# Patient Record
Sex: Male | Born: 1935
Health system: Southern US, Community
[De-identification: ages and names within clinical notes are randomized; demographics above are authoritative.]

## PROBLEM LIST (undated history)

## (undated) DIAGNOSIS — I10 Essential (primary) hypertension: Secondary | ICD-10-CM

## (undated) DIAGNOSIS — D126 Benign neoplasm of colon, unspecified: Secondary | ICD-10-CM

## (undated) DIAGNOSIS — C61 Malignant neoplasm of prostate: Secondary | ICD-10-CM

## (undated) DIAGNOSIS — E785 Hyperlipidemia, unspecified: Secondary | ICD-10-CM

## (undated) DIAGNOSIS — T4145XA Adverse effect of unspecified anesthetic, initial encounter: Secondary | ICD-10-CM

## (undated) DIAGNOSIS — I80209 Phlebitis and thrombophlebitis of unspecified deep vessels of unspecified lower extremity: Secondary | ICD-10-CM

## (undated) DIAGNOSIS — L723 Sebaceous cyst: Secondary | ICD-10-CM

## (undated) DIAGNOSIS — F419 Anxiety disorder, unspecified: Secondary | ICD-10-CM

## (undated) DIAGNOSIS — R7989 Other specified abnormal findings of blood chemistry: Secondary | ICD-10-CM

## (undated) DIAGNOSIS — I480 Paroxysmal atrial fibrillation: Secondary | ICD-10-CM

## (undated) DIAGNOSIS — T8859XA Other complications of anesthesia, initial encounter: Secondary | ICD-10-CM

## (undated) DIAGNOSIS — I6529 Occlusion and stenosis of unspecified carotid artery: Secondary | ICD-10-CM

## (undated) DIAGNOSIS — M1990 Unspecified osteoarthritis, unspecified site: Secondary | ICD-10-CM

## (undated) DIAGNOSIS — K449 Diaphragmatic hernia without obstruction or gangrene: Secondary | ICD-10-CM

## (undated) DIAGNOSIS — I4891 Unspecified atrial fibrillation: Secondary | ICD-10-CM

## (undated) DIAGNOSIS — K219 Gastro-esophageal reflux disease without esophagitis: Secondary | ICD-10-CM

## (undated) DIAGNOSIS — I2699 Other pulmonary embolism without acute cor pulmonale: Secondary | ICD-10-CM

## (undated) DIAGNOSIS — I2510 Atherosclerotic heart disease of native coronary artery without angina pectoris: Secondary | ICD-10-CM

## (undated) HISTORY — DX: Benign neoplasm of colon, unspecified: D12.6

## (undated) HISTORY — DX: Unspecified osteoarthritis, unspecified site: M19.90

## (undated) HISTORY — DX: Atherosclerotic heart disease of native coronary artery without angina pectoris: I25.10

## (undated) HISTORY — DX: Diaphragmatic hernia without obstruction or gangrene: K44.9

## (undated) HISTORY — DX: Essential (primary) hypertension: I10

## (undated) HISTORY — PX: TANDEM AND OVOID INSERTION: SHX2480

## (undated) HISTORY — DX: Unspecified atrial fibrillation: I48.91

## (undated) HISTORY — DX: Malignant neoplasm of prostate: C61

## (undated) HISTORY — DX: Gastro-esophageal reflux disease without esophagitis: K21.9

## (undated) HISTORY — PX: MITRAL VALVE REPAIR: SHX2039

## (undated) HISTORY — DX: Paroxysmal atrial fibrillation: I48.0

## (undated) HISTORY — DX: Sebaceous cyst: L72.3

## (undated) HISTORY — DX: Anxiety disorder, unspecified: F41.9

## (undated) HISTORY — DX: Other specified abnormal findings of blood chemistry: R79.89

## (undated) HISTORY — PX: OTHER SURGICAL HISTORY: SHX169

## (undated) HISTORY — PX: CORONARY ARTERY BYPASS GRAFT: SHX141

## (undated) HISTORY — DX: Other pulmonary embolism without acute cor pulmonale: I26.99

## (undated) HISTORY — DX: Hyperlipidemia, unspecified: E78.5

## (undated) HISTORY — DX: Phlebitis and thrombophlebitis of unspecified deep vessels of unspecified lower extremity: I80.209

## (undated) HISTORY — PX: VASECTOMY: SHX75

## (undated) HISTORY — DX: Occlusion and stenosis of unspecified carotid artery: I65.29

---

## 1996-11-03 HISTORY — PX: OTHER SURGICAL HISTORY: SHX169

## 1998-01-02 ENCOUNTER — Ambulatory Visit (HOSPITAL_COMMUNITY): Admission: RE | Admit: 1998-01-02 | Discharge: 1998-01-02 | Payer: Self-pay | Admitting: Urology

## 2004-05-08 ENCOUNTER — Ambulatory Visit: Payer: Self-pay

## 2004-05-22 ENCOUNTER — Ambulatory Visit: Payer: Self-pay | Admitting: Internal Medicine

## 2004-07-24 ENCOUNTER — Ambulatory Visit: Payer: Self-pay | Admitting: Cardiology

## 2004-08-07 ENCOUNTER — Ambulatory Visit: Payer: Self-pay | Admitting: Pulmonary Disease

## 2004-08-14 ENCOUNTER — Ambulatory Visit: Payer: Self-pay | Admitting: *Deleted

## 2004-08-21 ENCOUNTER — Ambulatory Visit: Payer: Self-pay | Admitting: *Deleted

## 2004-09-18 ENCOUNTER — Ambulatory Visit: Payer: Self-pay | Admitting: Cardiology

## 2004-10-23 ENCOUNTER — Ambulatory Visit: Payer: Self-pay | Admitting: Cardiology

## 2004-11-20 ENCOUNTER — Ambulatory Visit: Payer: Self-pay | Admitting: Cardiology

## 2004-12-04 ENCOUNTER — Ambulatory Visit: Payer: Self-pay | Admitting: Internal Medicine

## 2005-01-07 ENCOUNTER — Ambulatory Visit: Payer: Self-pay | Admitting: Internal Medicine

## 2005-01-26 ENCOUNTER — Ambulatory Visit: Payer: Self-pay | Admitting: Cardiology

## 2005-02-02 ENCOUNTER — Ambulatory Visit: Payer: Self-pay | Admitting: Cardiology

## 2005-02-18 ENCOUNTER — Ambulatory Visit: Payer: Self-pay | Admitting: Cardiology

## 2005-03-18 ENCOUNTER — Ambulatory Visit: Payer: Self-pay | Admitting: Cardiology

## 2005-04-01 ENCOUNTER — Ambulatory Visit: Payer: Self-pay | Admitting: Cardiology

## 2005-04-24 ENCOUNTER — Ambulatory Visit: Payer: Self-pay | Admitting: Cardiology

## 2005-05-22 ENCOUNTER — Ambulatory Visit: Payer: Self-pay | Admitting: Cardiology

## 2005-05-22 ENCOUNTER — Ambulatory Visit: Payer: Self-pay

## 2005-06-05 ENCOUNTER — Ambulatory Visit: Payer: Self-pay | Admitting: Internal Medicine

## 2005-07-01 ENCOUNTER — Ambulatory Visit: Payer: Self-pay | Admitting: Pulmonary Disease

## 2005-07-03 ENCOUNTER — Ambulatory Visit: Payer: Self-pay | Admitting: Cardiology

## 2005-07-10 ENCOUNTER — Ambulatory Visit: Payer: Self-pay | Admitting: Cardiology

## 2005-07-15 ENCOUNTER — Ambulatory Visit: Payer: Self-pay | Admitting: Pulmonary Disease

## 2005-07-27 ENCOUNTER — Ambulatory Visit: Payer: Self-pay | Admitting: Internal Medicine

## 2005-07-27 ENCOUNTER — Ambulatory Visit: Payer: Self-pay | Admitting: Cardiology

## 2005-08-06 ENCOUNTER — Encounter: Payer: Self-pay | Admitting: Cardiology

## 2005-08-06 ENCOUNTER — Ambulatory Visit: Payer: Self-pay

## 2005-08-06 ENCOUNTER — Ambulatory Visit: Payer: Self-pay | Admitting: Cardiology

## 2005-08-14 ENCOUNTER — Ambulatory Visit: Payer: Self-pay | Admitting: Cardiovascular Disease

## 2005-08-24 ENCOUNTER — Ambulatory Visit: Payer: Self-pay | Admitting: Cardiology

## 2005-09-11 ENCOUNTER — Ambulatory Visit: Payer: Self-pay | Admitting: Cardiology

## 2005-10-02 ENCOUNTER — Ambulatory Visit: Payer: Self-pay | Admitting: Cardiology

## 2005-10-23 ENCOUNTER — Ambulatory Visit: Payer: Self-pay | Admitting: Internal Medicine

## 2005-11-19 ENCOUNTER — Ambulatory Visit: Payer: Self-pay | Admitting: Cardiology

## 2005-12-17 ENCOUNTER — Ambulatory Visit: Payer: Self-pay | Admitting: Cardiology

## 2006-01-07 ENCOUNTER — Ambulatory Visit: Payer: Self-pay | Admitting: Internal Medicine

## 2006-01-11 ENCOUNTER — Ambulatory Visit: Payer: Self-pay | Admitting: Pulmonary Disease

## 2006-02-11 ENCOUNTER — Ambulatory Visit: Payer: Self-pay | Admitting: Cardiology

## 2006-03-09 ENCOUNTER — Ambulatory Visit: Payer: Self-pay | Admitting: Cardiology

## 2006-03-11 ENCOUNTER — Ambulatory Visit: Payer: Self-pay | Admitting: Cardiology

## 2006-03-31 ENCOUNTER — Ambulatory Visit: Payer: Self-pay | Admitting: Cardiology

## 2006-04-07 ENCOUNTER — Ambulatory Visit: Payer: Self-pay | Admitting: Cardiology

## 2006-05-05 ENCOUNTER — Ambulatory Visit: Payer: Self-pay | Admitting: Cardiovascular Disease

## 2006-06-02 ENCOUNTER — Ambulatory Visit: Payer: Self-pay | Admitting: Cardiovascular Disease

## 2006-06-30 ENCOUNTER — Ambulatory Visit: Payer: Self-pay | Admitting: Cardiology

## 2006-07-28 ENCOUNTER — Ambulatory Visit: Payer: Self-pay | Admitting: Cardiovascular Disease

## 2006-08-25 ENCOUNTER — Ambulatory Visit: Payer: Self-pay | Admitting: Cardiology

## 2006-09-08 ENCOUNTER — Ambulatory Visit: Payer: Self-pay | Admitting: Cardiology

## 2006-09-17 ENCOUNTER — Ambulatory Visit: Payer: Self-pay | Admitting: Cardiovascular Disease

## 2006-10-13 ENCOUNTER — Ambulatory Visit: Payer: Self-pay | Admitting: Cardiology

## 2006-11-10 ENCOUNTER — Ambulatory Visit: Payer: Self-pay | Admitting: Cardiology

## 2006-12-08 ENCOUNTER — Ambulatory Visit: Payer: Self-pay | Admitting: Cardiology

## 2006-12-30 ENCOUNTER — Ambulatory Visit: Payer: Self-pay | Admitting: Internal Medicine

## 2007-01-19 ENCOUNTER — Ambulatory Visit: Payer: Self-pay | Admitting: Cardiovascular Disease

## 2007-02-16 ENCOUNTER — Ambulatory Visit: Payer: Self-pay | Admitting: Cardiology

## 2007-03-09 ENCOUNTER — Ambulatory Visit: Payer: Self-pay | Admitting: Cardiovascular Disease

## 2007-03-16 ENCOUNTER — Ambulatory Visit: Payer: Self-pay | Admitting: Cardiology

## 2007-04-06 ENCOUNTER — Ambulatory Visit: Payer: Self-pay | Admitting: Cardiology

## 2007-04-06 ENCOUNTER — Ambulatory Visit: Payer: Self-pay

## 2007-04-06 ENCOUNTER — Encounter: Payer: Self-pay | Admitting: Cardiology

## 2007-04-06 LAB — CONVERTED CEMR LAB
ALT: 27 units/L (ref 0–53)
AST: 29 units/L (ref 0–37)
Albumin: 4 g/dL (ref 3.5–5.2)
Alkaline Phosphatase: 93 units/L (ref 39–117)
Basophils Absolute: 0 10*3/uL (ref 0.0–0.1)
Calcium: 8.9 mg/dL (ref 8.4–10.5)
Chloride: 109 meq/L (ref 96–112)
Creatinine, Ser: 1 mg/dL (ref 0.4–1.5)
Eosinophils Absolute: 0.1 10*3/uL (ref 0.0–0.6)
GFR calc non Af Amer: 79 mL/min
HCT: 40.7 % (ref 39.0–52.0)
HDL: 55.2 mg/dL (ref >39.0)
MCHC: 34.4 g/dL (ref 30.0–36.0)
Neutrophils Relative %: 53.3 % (ref 43.0–77.0)
PSA: 0.04 ng/mL — ABNORMAL LOW (ref 0.10–4.00)
Platelets: 171 10*3/uL (ref 150–400)
RBC: 4.74 M/uL (ref 4.22–5.81)
RDW: 13.2 % (ref 11.5–14.6)
Sodium: 143 meq/L (ref 135–145)
Total Bilirubin: 1.1 mg/dL (ref 0.3–1.2)
Total CHOL/HDL Ratio: 4.1
Triglycerides: 124 mg/dL (ref 0–149)
WBC: 5 10*3/uL (ref 4.5–10.5)

## 2007-05-04 ENCOUNTER — Ambulatory Visit: Payer: Self-pay | Admitting: Cardiology

## 2007-05-10 ENCOUNTER — Ambulatory Visit: Payer: Self-pay | Admitting: Cardiology

## 2007-05-31 ENCOUNTER — Ambulatory Visit: Payer: Self-pay | Admitting: Cardiovascular Disease

## 2007-06-15 ENCOUNTER — Ambulatory Visit: Payer: Self-pay | Admitting: Cardiology

## 2007-06-15 ENCOUNTER — Encounter: Payer: Self-pay | Admitting: Cardiology

## 2007-06-15 ENCOUNTER — Ambulatory Visit (HOSPITAL_COMMUNITY): Admission: RE | Admit: 2007-06-15 | Discharge: 2007-06-15 | Payer: Self-pay | Admitting: Cardiology

## 2007-06-24 ENCOUNTER — Ambulatory Visit: Payer: Self-pay | Admitting: Cardiology

## 2007-06-28 ENCOUNTER — Ambulatory Visit: Payer: Self-pay | Admitting: Cardiology

## 2007-07-05 ENCOUNTER — Ambulatory Visit: Payer: Self-pay | Admitting: Cardiology

## 2007-07-05 LAB — CONVERTED CEMR LAB
Basophils Relative: 0.2 % (ref 0.0–1.0)
CO2: 28 meq/L (ref 19–32)
Calcium: 9.1 mg/dL (ref 8.4–10.5)
Eosinophils Absolute: 0.1 10*3/uL (ref 0.0–0.6)
Eosinophils Relative: 1.9 % (ref 0.0–5.0)
GFR calc Af Amer: 108 mL/min
GFR calc non Af Amer: 89 mL/min
Glucose, Bld: 107 mg/dL — ABNORMAL HIGH (ref 70–99)
HCT: 41.2 % (ref 39.0–52.0)
Hemoglobin: 14.2 g/dL (ref 13.0–17.0)
Lymphocytes Relative: 40.3 % (ref 12.0–46.0)
MCV: 86.4 fL (ref 78.0–100.0)
Neutro Abs: 2.2 10*3/uL (ref 1.4–7.7)
Neutrophils Relative %: 48.2 % (ref 43.0–77.0)
Platelets: 184 10*3/uL (ref 150–400)
Prothrombin Time: 18.1 s — ABNORMAL HIGH (ref 10.9–13.3)
Sodium: 142 meq/L (ref 135–145)
WBC: 4.6 10*3/uL (ref 4.5–10.5)

## 2007-07-11 ENCOUNTER — Ambulatory Visit: Payer: Self-pay | Admitting: Thoracic Surgery (Cardiothoracic Vascular Surgery)

## 2007-07-11 ENCOUNTER — Ambulatory Visit: Payer: Self-pay | Admitting: Internal Medicine

## 2007-07-11 ENCOUNTER — Encounter: Payer: Self-pay | Admitting: Pulmonary Disease

## 2007-07-12 ENCOUNTER — Encounter: Payer: Self-pay | Admitting: Pulmonary Disease

## 2007-07-12 ENCOUNTER — Ambulatory Visit: Payer: Self-pay | Admitting: Cardiology

## 2007-07-12 ENCOUNTER — Inpatient Hospital Stay (HOSPITAL_BASED_OUTPATIENT_CLINIC_OR_DEPARTMENT_OTHER): Admission: RE | Admit: 2007-07-12 | Discharge: 2007-07-12 | Payer: Self-pay | Admitting: Cardiology

## 2007-07-13 ENCOUNTER — Ambulatory Visit: Payer: Self-pay | Admitting: Cardiology

## 2007-07-13 LAB — CONVERTED CEMR LAB
CO2: 27 meq/L (ref 19–32)
Calcium: 9.2 mg/dL (ref 8.4–10.5)
Chloride: 103 meq/L (ref 96–112)
GFR calc Af Amer: 85 mL/min
Glucose, Bld: 95 mg/dL (ref 70–99)
Sodium: 139 meq/L (ref 135–145)

## 2007-07-18 ENCOUNTER — Ambulatory Visit: Payer: Self-pay | Admitting: Thoracic Surgery (Cardiothoracic Vascular Surgery)

## 2007-07-18 ENCOUNTER — Encounter: Payer: Self-pay | Admitting: Pulmonary Disease

## 2007-07-20 ENCOUNTER — Ambulatory Visit (HOSPITAL_COMMUNITY): Admission: RE | Admit: 2007-07-20 | Discharge: 2007-07-20 | Payer: Self-pay | Admitting: Cardiology

## 2007-07-20 ENCOUNTER — Ambulatory Visit: Payer: Self-pay | Admitting: Cardiology

## 2007-07-22 ENCOUNTER — Encounter: Payer: Self-pay | Admitting: Thoracic Surgery (Cardiothoracic Vascular Surgery)

## 2007-07-22 ENCOUNTER — Ambulatory Visit (HOSPITAL_COMMUNITY)
Admission: RE | Admit: 2007-07-22 | Discharge: 2007-07-22 | Payer: Self-pay | Admitting: Thoracic Surgery (Cardiothoracic Vascular Surgery)

## 2007-07-26 ENCOUNTER — Encounter: Payer: Self-pay | Admitting: Thoracic Surgery (Cardiothoracic Vascular Surgery)

## 2007-07-26 ENCOUNTER — Ambulatory Visit: Payer: Self-pay | Admitting: Thoracic Surgery (Cardiothoracic Vascular Surgery)

## 2007-07-26 ENCOUNTER — Inpatient Hospital Stay (HOSPITAL_COMMUNITY)
Admission: RE | Admit: 2007-07-26 | Discharge: 2007-07-31 | Payer: Self-pay | Admitting: Thoracic Surgery (Cardiothoracic Vascular Surgery)

## 2007-07-26 ENCOUNTER — Ambulatory Visit: Payer: Self-pay | Admitting: Cardiology

## 2007-08-03 ENCOUNTER — Ambulatory Visit: Payer: Self-pay | Admitting: Cardiology

## 2007-08-09 ENCOUNTER — Ambulatory Visit: Payer: Self-pay | Admitting: Cardiovascular Disease

## 2007-08-15 ENCOUNTER — Ambulatory Visit: Payer: Self-pay | Admitting: Thoracic Surgery (Cardiothoracic Vascular Surgery)

## 2007-08-15 ENCOUNTER — Encounter
Admission: RE | Admit: 2007-08-15 | Discharge: 2007-08-15 | Payer: Self-pay | Admitting: Thoracic Surgery (Cardiothoracic Vascular Surgery)

## 2007-08-15 ENCOUNTER — Encounter: Payer: Self-pay | Admitting: Pulmonary Disease

## 2007-08-22 ENCOUNTER — Encounter
Admission: RE | Admit: 2007-08-22 | Discharge: 2007-08-22 | Payer: Self-pay | Admitting: Thoracic Surgery (Cardiothoracic Vascular Surgery)

## 2007-08-22 ENCOUNTER — Ambulatory Visit: Payer: Self-pay | Admitting: Thoracic Surgery (Cardiothoracic Vascular Surgery)

## 2007-08-31 ENCOUNTER — Ambulatory Visit: Payer: Self-pay | Admitting: Cardiology

## 2007-09-01 ENCOUNTER — Encounter (HOSPITAL_COMMUNITY): Admission: RE | Admit: 2007-09-01 | Discharge: 2007-11-30 | Payer: Self-pay | Admitting: Cardiology

## 2007-09-08 ENCOUNTER — Ambulatory Visit: Payer: Self-pay | Admitting: Cardiovascular Disease

## 2007-09-08 ENCOUNTER — Ambulatory Visit: Payer: Self-pay | Admitting: Cardiology

## 2007-09-08 LAB — CONVERTED CEMR LAB
Chloride: 111 meq/L (ref 96–112)
Creatinine, Ser: 1.1 mg/dL (ref 0.4–1.5)
Glucose, Bld: 128 mg/dL — ABNORMAL HIGH (ref 70–99)
Potassium: 4 meq/L (ref 3.5–5.1)
Sodium: 143 meq/L (ref 135–145)

## 2007-09-22 ENCOUNTER — Ambulatory Visit: Payer: Self-pay | Admitting: Cardiology

## 2007-10-06 ENCOUNTER — Ambulatory Visit: Payer: Self-pay | Admitting: Cardiology

## 2007-10-06 LAB — CONVERTED CEMR LAB
CO2: 29 meq/L (ref 19–32)
Calcium: 9.2 mg/dL (ref 8.4–10.5)
Creatinine, Ser: 1.3 mg/dL (ref 0.4–1.5)
GFR calc Af Amer: 70 mL/min
Glucose, Bld: 140 mg/dL — ABNORMAL HIGH (ref 70–99)

## 2007-10-10 ENCOUNTER — Ambulatory Visit: Payer: Self-pay | Admitting: Thoracic Surgery (Cardiothoracic Vascular Surgery)

## 2007-10-10 ENCOUNTER — Encounter
Admission: RE | Admit: 2007-10-10 | Discharge: 2007-10-10 | Payer: Self-pay | Admitting: Thoracic Surgery (Cardiothoracic Vascular Surgery)

## 2007-10-10 ENCOUNTER — Encounter: Payer: Self-pay | Admitting: Pulmonary Disease

## 2007-10-19 ENCOUNTER — Ambulatory Visit: Payer: Self-pay | Admitting: Cardiology

## 2007-10-27 ENCOUNTER — Ambulatory Visit: Payer: Self-pay

## 2007-10-27 ENCOUNTER — Encounter: Payer: Self-pay | Admitting: Cardiology

## 2007-10-27 ENCOUNTER — Ambulatory Visit: Payer: Self-pay | Admitting: Cardiology

## 2007-11-17 ENCOUNTER — Ambulatory Visit: Payer: Self-pay | Admitting: Cardiology

## 2007-12-01 ENCOUNTER — Encounter (HOSPITAL_COMMUNITY): Admission: RE | Admit: 2007-12-01 | Discharge: 2008-01-04 | Payer: Self-pay | Admitting: Cardiology

## 2007-12-07 ENCOUNTER — Ambulatory Visit: Payer: Self-pay | Admitting: Cardiovascular Disease

## 2007-12-28 ENCOUNTER — Ambulatory Visit: Payer: Self-pay | Admitting: Internal Medicine

## 2008-01-25 ENCOUNTER — Ambulatory Visit: Payer: Self-pay | Admitting: Internal Medicine

## 2008-02-08 ENCOUNTER — Ambulatory Visit: Payer: Self-pay | Admitting: Internal Medicine

## 2008-03-07 ENCOUNTER — Ambulatory Visit: Payer: Self-pay | Admitting: Cardiology

## 2008-04-05 ENCOUNTER — Ambulatory Visit: Payer: Self-pay | Admitting: Internal Medicine

## 2008-04-05 ENCOUNTER — Ambulatory Visit: Payer: Self-pay | Admitting: Cardiology

## 2008-04-05 LAB — CONVERTED CEMR LAB
Albumin: 3.9 g/dL (ref 3.5–5.2)
Alkaline Phosphatase: 96 units/L (ref 39–117)
Bilirubin, Direct: 0.1 mg/dL (ref 0.0–0.3)
Chloride: 106 meq/L (ref 96–112)
GFR calc Af Amer: 85 mL/min
GFR calc non Af Amer: 71 mL/min
INR: 4.6 — ABNORMAL HIGH (ref 0.8–1.0)
Potassium: 4.7 meq/L (ref 3.5–5.1)
Prothrombin Time: 45.6 s — ABNORMAL HIGH (ref 10.9–13.3)
Sodium: 143 meq/L (ref 135–145)
Total Bilirubin: 0.8 mg/dL (ref 0.3–1.2)
Total CHOL/HDL Ratio: 4.5

## 2008-04-20 ENCOUNTER — Ambulatory Visit: Payer: Self-pay | Admitting: Cardiology

## 2008-04-20 ENCOUNTER — Ambulatory Visit: Payer: Self-pay

## 2008-04-27 ENCOUNTER — Ambulatory Visit: Payer: Self-pay | Admitting: Cardiology

## 2008-05-08 ENCOUNTER — Ambulatory Visit: Payer: Self-pay | Admitting: Internal Medicine

## 2008-06-05 ENCOUNTER — Ambulatory Visit: Payer: Self-pay | Admitting: Cardiology

## 2008-06-25 ENCOUNTER — Encounter: Payer: Self-pay | Admitting: Cardiovascular Disease

## 2008-06-25 ENCOUNTER — Ambulatory Visit: Payer: Self-pay | Admitting: Cardiovascular Disease

## 2008-06-25 ENCOUNTER — Ambulatory Visit: Payer: Self-pay | Admitting: Cardiology

## 2008-06-25 ENCOUNTER — Observation Stay (HOSPITAL_COMMUNITY): Admission: EM | Admit: 2008-06-25 | Discharge: 2008-06-26 | Payer: Self-pay | Admitting: Emergency Medicine

## 2008-07-03 ENCOUNTER — Ambulatory Visit: Payer: Self-pay | Admitting: Cardiology

## 2008-07-09 IMAGING — CR DG CHEST DECUBITUS*L*
2 series · 2 of 2 positions shown · non-contrast
Comparison: Same day.

CLINICAL DATA: Left pleural effusion.  
 LEFT LATERAL DECUBITUS VIEW OF THE CHEST:

[view not recorded (1 of 2)]
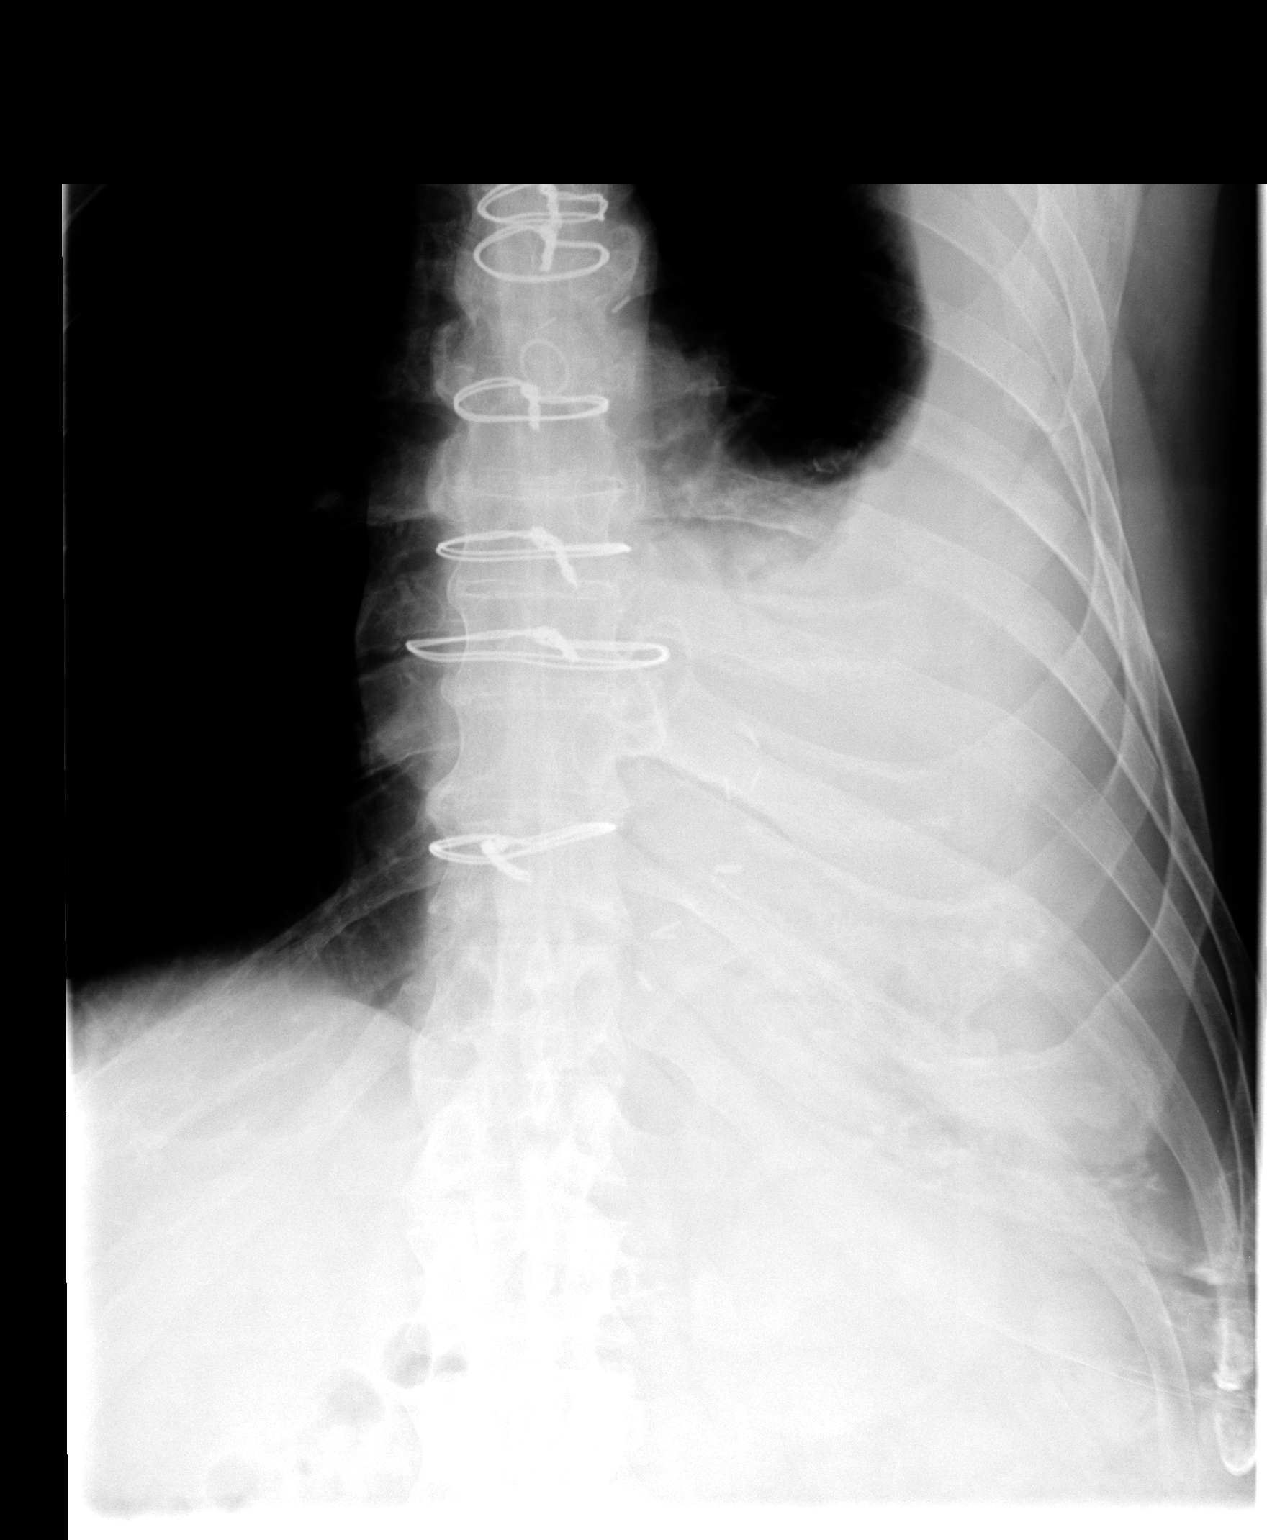

[view not recorded (2 of 2)]
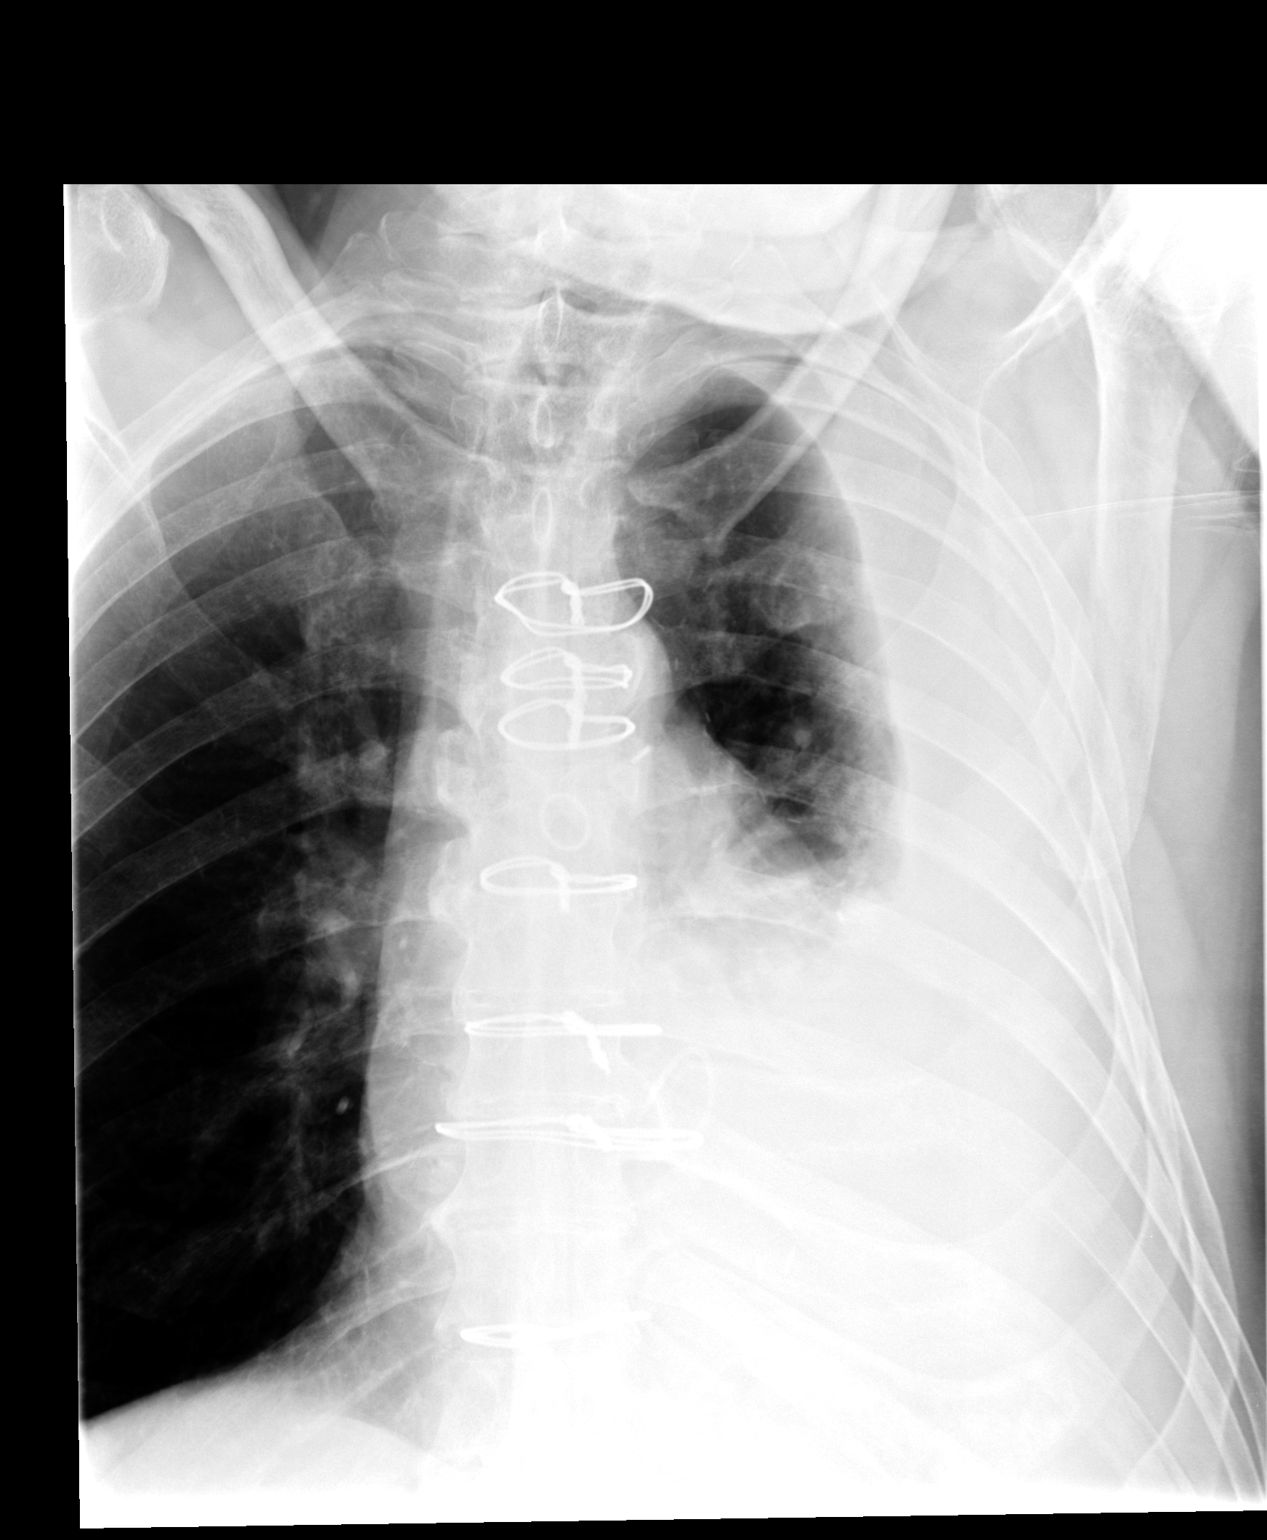

[2 of 2 positions shown; findings below may reference images not displayed]

FINDINGS: There is a large layering left pleural effusion with associated compressive atelectasis in the left lung.
IMPRESSION: Layering left pleural effusion.

## 2008-07-12 ENCOUNTER — Encounter: Payer: Self-pay | Admitting: Pulmonary Disease

## 2008-07-12 ENCOUNTER — Ambulatory Visit: Payer: Self-pay

## 2008-07-16 IMAGING — CR DG CHEST 2V
2 series · 2 of 2 positions shown · non-contrast
Comparison: 08/15/07.

CLINICAL DATA: Post CABG.
 TWO VIEW CHEST:

[w chest pa]
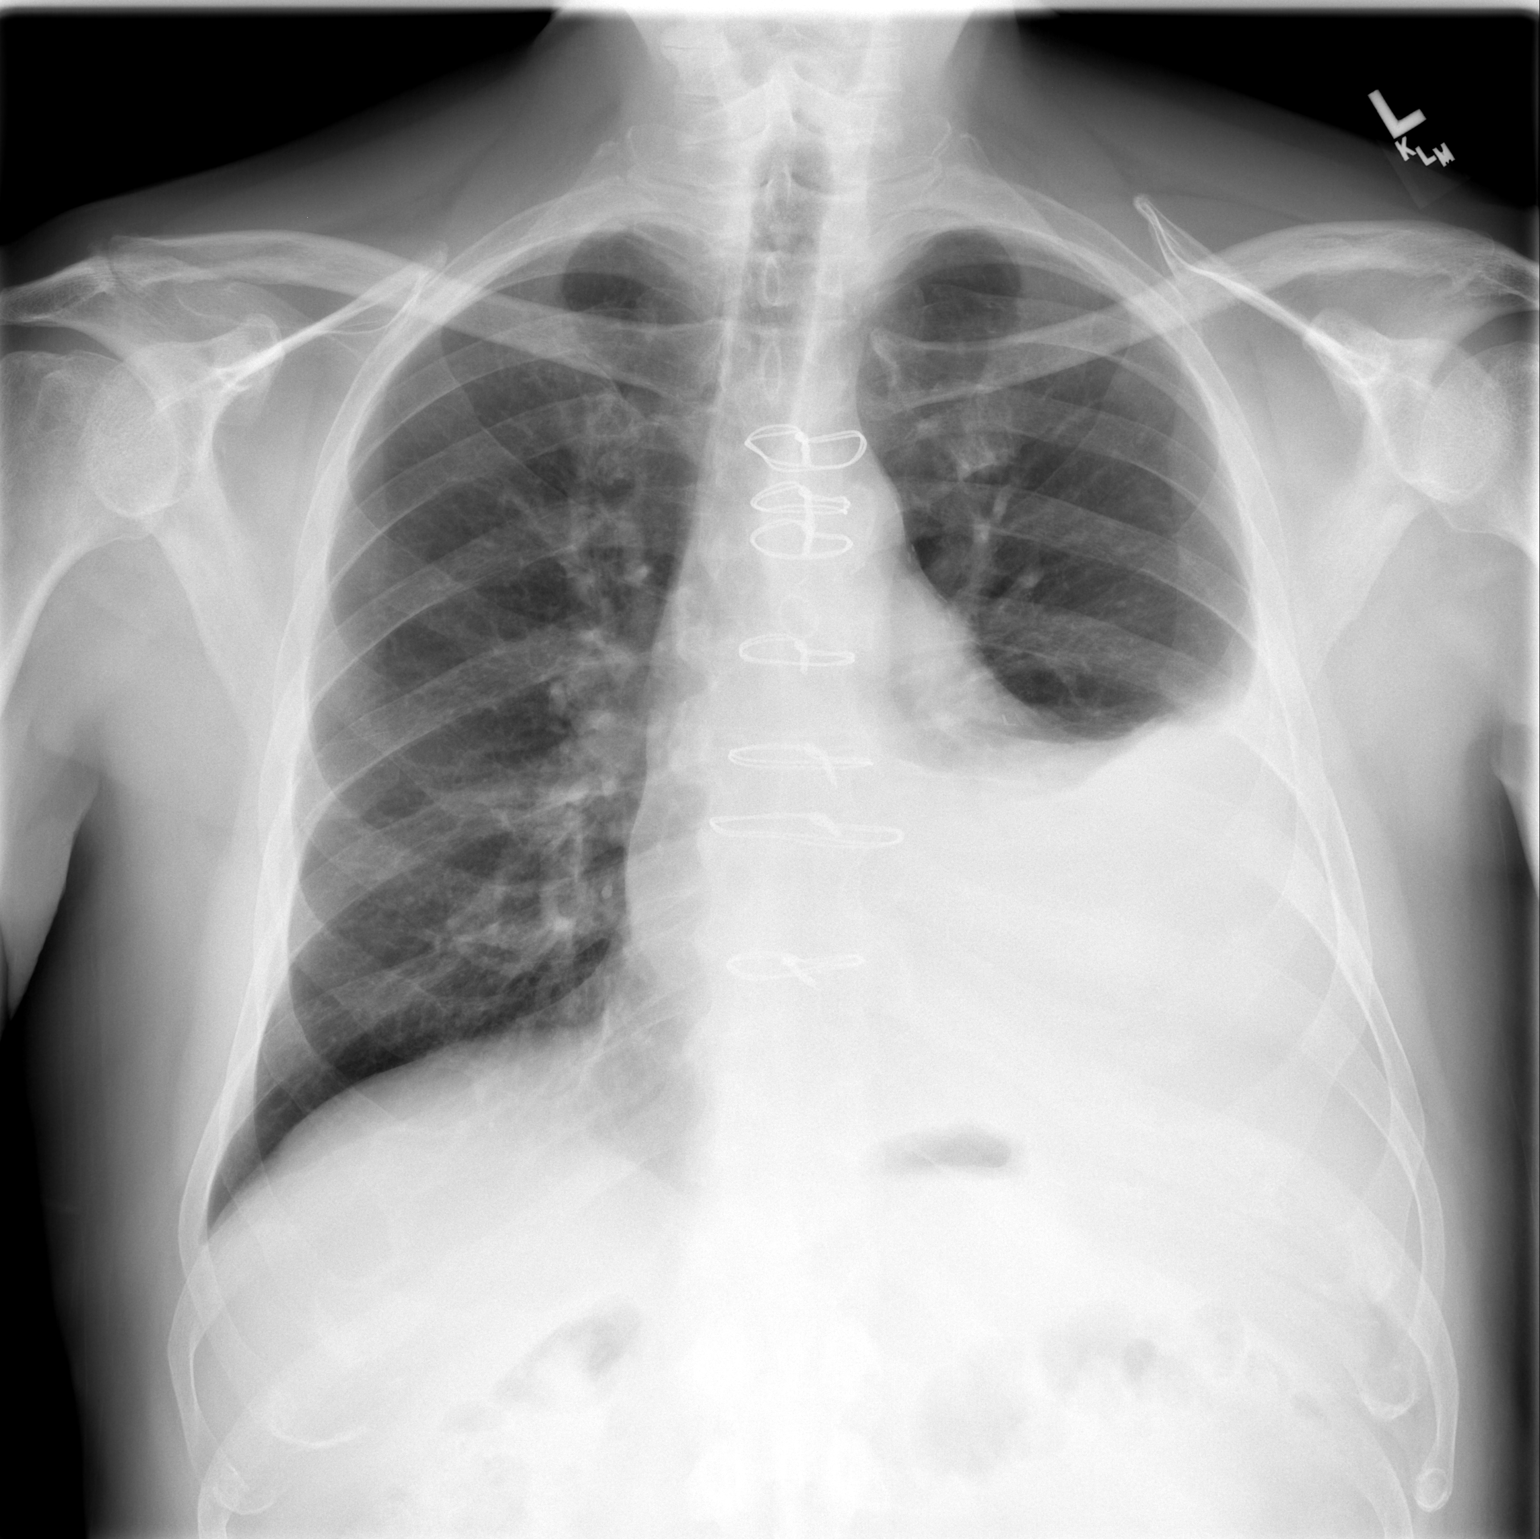

[w chest lat]
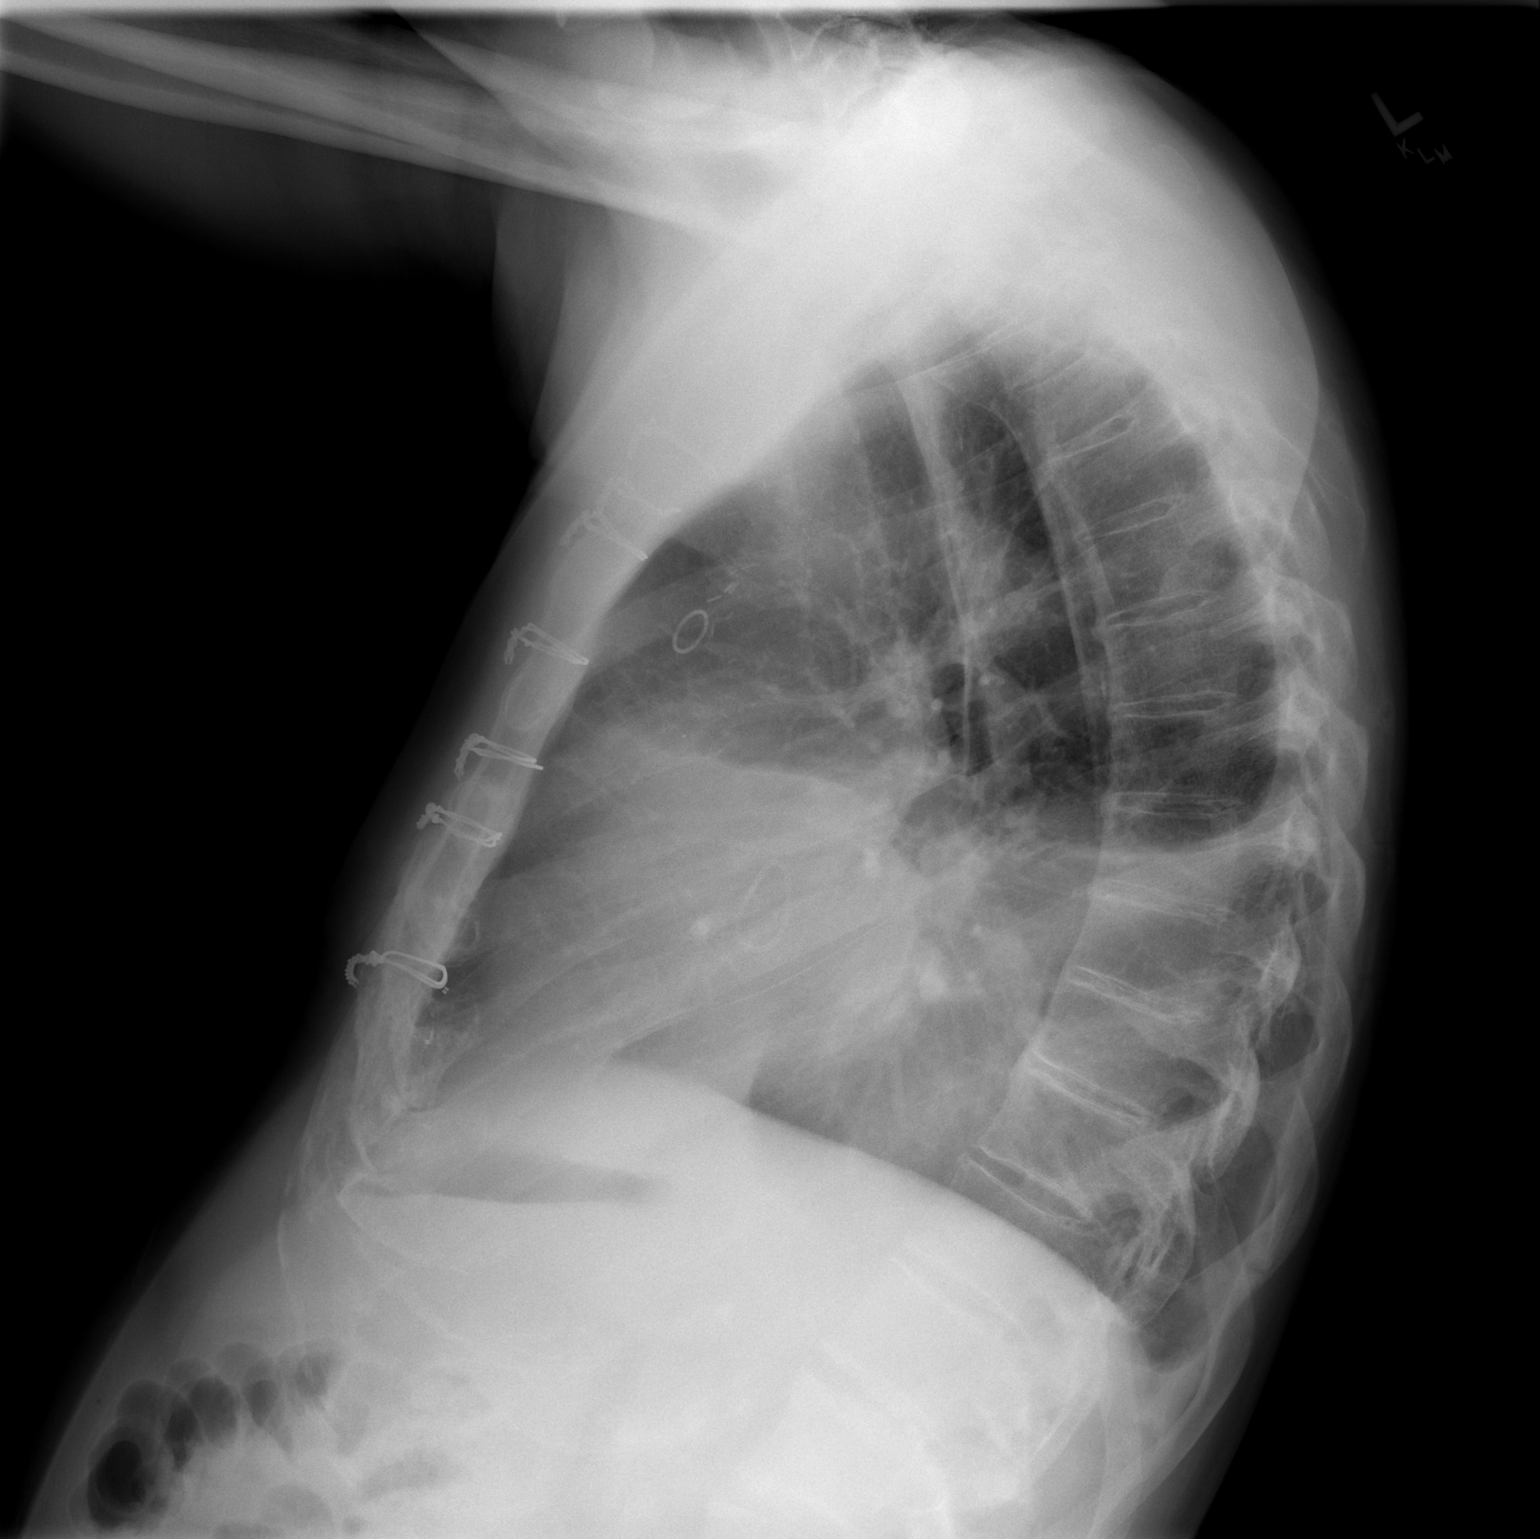

[2 of 2 positions shown; findings below may reference images not displayed]

FINDINGS: Trachea is midline.  Heart size stable.  Left pleural effusion occupies approximately 50% of the left hemithorax, stable.  Compressive atelectasis in the left lower lobe and lingula.   Right lung clear.
IMPRESSION: Stable large left pleural effusion.

## 2008-07-31 ENCOUNTER — Ambulatory Visit: Payer: Self-pay | Admitting: Cardiology

## 2008-08-02 ENCOUNTER — Ambulatory Visit: Payer: Self-pay | Admitting: Cardiology

## 2008-08-21 ENCOUNTER — Ambulatory Visit: Payer: Self-pay | Admitting: Cardiology

## 2008-09-18 ENCOUNTER — Ambulatory Visit: Payer: Self-pay | Admitting: Cardiology

## 2008-10-16 ENCOUNTER — Ambulatory Visit: Payer: Self-pay | Admitting: Cardiology

## 2008-10-25 DIAGNOSIS — I4891 Unspecified atrial fibrillation: Secondary | ICD-10-CM | POA: Insufficient documentation

## 2008-11-13 ENCOUNTER — Ambulatory Visit: Payer: Self-pay | Admitting: Internal Medicine

## 2008-11-19 ENCOUNTER — Encounter (INDEPENDENT_AMBULATORY_CARE_PROVIDER_SITE_OTHER): Payer: Self-pay | Admitting: *Deleted

## 2008-12-04 ENCOUNTER — Encounter: Payer: Self-pay | Admitting: *Deleted

## 2008-12-11 ENCOUNTER — Encounter (INDEPENDENT_AMBULATORY_CARE_PROVIDER_SITE_OTHER): Payer: Self-pay | Admitting: Cardiology

## 2008-12-11 ENCOUNTER — Ambulatory Visit: Payer: Self-pay | Admitting: Cardiology

## 2008-12-11 LAB — CONVERTED CEMR LAB: POC INR: 2.2

## 2009-01-09 ENCOUNTER — Encounter: Payer: Self-pay | Admitting: *Deleted

## 2009-01-10 ENCOUNTER — Ambulatory Visit: Payer: Self-pay | Admitting: Cardiology

## 2009-01-10 DIAGNOSIS — Z9889 Other specified postprocedural states: Secondary | ICD-10-CM

## 2009-01-10 DIAGNOSIS — I10 Essential (primary) hypertension: Secondary | ICD-10-CM | POA: Insufficient documentation

## 2009-01-10 LAB — CONVERTED CEMR LAB
POC INR: 1.9
Prothrombin Time: 17.8 s

## 2009-01-11 LAB — CONVERTED CEMR LAB
Albumin: 4 g/dL (ref 3.5–5.2)
BUN: 19 mg/dL (ref 6–23)
Cholesterol: 217 mg/dL — ABNORMAL HIGH (ref 0–200)
Direct LDL: 152.2 mg/dL
Eosinophils Relative: 1.7 % (ref 0.0–5.0)
GFR calc non Af Amer: 78.35 mL/min (ref >60)
HCT: 39.6 % (ref 39.0–52.0)
Lymphs Abs: 1.4 10*3/uL (ref 0.7–4.0)
Monocytes Relative: 8 % (ref 3.0–12.0)
Neutrophils Relative %: 62 % (ref 43.0–77.0)
Platelets: 160 10*3/uL (ref 150.0–400.0)
Potassium: 4.2 meq/L (ref 3.5–5.1)
RBC: 4.69 M/uL (ref 4.22–5.81)
Sodium: 142 meq/L (ref 135–145)
Total CHOL/HDL Ratio: 4
Triglycerides: 102 mg/dL (ref 0.0–149.0)
VLDL: 20.4 mg/dL (ref 0.0–40.0)
WBC: 4.9 10*3/uL (ref 4.5–10.5)

## 2009-02-07 ENCOUNTER — Ambulatory Visit: Payer: Self-pay | Admitting: Cardiology

## 2009-02-07 LAB — CONVERTED CEMR LAB: Prothrombin Time: 20.5 s

## 2009-03-01 ENCOUNTER — Ambulatory Visit: Payer: Self-pay | Admitting: Internal Medicine

## 2009-03-01 LAB — CONVERTED CEMR LAB: POC INR: 2.9

## 2009-03-18 ENCOUNTER — Encounter: Payer: Self-pay | Admitting: Pulmonary Disease

## 2009-03-22 ENCOUNTER — Ambulatory Visit: Payer: Self-pay | Admitting: Cardiovascular Disease

## 2009-04-19 ENCOUNTER — Ambulatory Visit: Payer: Self-pay | Admitting: Cardiology

## 2009-04-24 ENCOUNTER — Ambulatory Visit: Payer: Self-pay

## 2009-04-24 ENCOUNTER — Encounter: Payer: Self-pay | Admitting: Physician Assistant

## 2009-04-24 ENCOUNTER — Ambulatory Visit: Payer: Self-pay | Admitting: Cardiovascular Disease

## 2009-04-24 ENCOUNTER — Telehealth (INDEPENDENT_AMBULATORY_CARE_PROVIDER_SITE_OTHER): Payer: Self-pay | Admitting: *Deleted

## 2009-04-24 ENCOUNTER — Encounter: Payer: Self-pay | Admitting: Cardiology

## 2009-04-24 DIAGNOSIS — M542 Cervicalgia: Secondary | ICD-10-CM | POA: Insufficient documentation

## 2009-04-24 DIAGNOSIS — I951 Orthostatic hypotension: Secondary | ICD-10-CM | POA: Insufficient documentation

## 2009-04-24 DIAGNOSIS — R42 Dizziness and giddiness: Secondary | ICD-10-CM | POA: Insufficient documentation

## 2009-04-25 ENCOUNTER — Ambulatory Visit: Payer: Self-pay | Admitting: Pulmonary Disease

## 2009-04-25 ENCOUNTER — Encounter: Payer: Self-pay | Admitting: Cardiology

## 2009-04-25 DIAGNOSIS — K219 Gastro-esophageal reflux disease without esophagitis: Secondary | ICD-10-CM | POA: Insufficient documentation

## 2009-04-25 DIAGNOSIS — L723 Sebaceous cyst: Secondary | ICD-10-CM | POA: Insufficient documentation

## 2009-04-25 DIAGNOSIS — I82409 Acute embolism and thrombosis of unspecified deep veins of unspecified lower extremity: Secondary | ICD-10-CM | POA: Insufficient documentation

## 2009-04-25 DIAGNOSIS — K649 Unspecified hemorrhoids: Secondary | ICD-10-CM | POA: Insufficient documentation

## 2009-04-25 DIAGNOSIS — M1990 Unspecified osteoarthritis, unspecified site: Secondary | ICD-10-CM | POA: Insufficient documentation

## 2009-04-25 DIAGNOSIS — Z86711 Personal history of pulmonary embolism: Secondary | ICD-10-CM

## 2009-04-25 DIAGNOSIS — L219 Seborrheic dermatitis, unspecified: Secondary | ICD-10-CM | POA: Insufficient documentation

## 2009-05-21 ENCOUNTER — Encounter: Payer: Self-pay | Admitting: Pulmonary Disease

## 2009-05-27 ENCOUNTER — Ambulatory Visit: Payer: Self-pay | Admitting: Cardiology

## 2009-05-27 ENCOUNTER — Ambulatory Visit: Payer: Self-pay | Admitting: Internal Medicine

## 2009-06-24 ENCOUNTER — Ambulatory Visit: Payer: Self-pay | Admitting: Internal Medicine

## 2009-07-22 ENCOUNTER — Ambulatory Visit: Payer: Self-pay | Admitting: Cardiology

## 2009-08-19 ENCOUNTER — Ambulatory Visit: Payer: Self-pay | Admitting: Internal Medicine

## 2009-09-16 ENCOUNTER — Ambulatory Visit: Payer: Self-pay | Admitting: Cardiology

## 2009-09-16 LAB — CONVERTED CEMR LAB: POC INR: 2.7

## 2009-10-14 ENCOUNTER — Ambulatory Visit: Payer: Self-pay | Admitting: Internal Medicine

## 2009-10-14 LAB — CONVERTED CEMR LAB: POC INR: 2.1

## 2009-11-11 ENCOUNTER — Ambulatory Visit: Payer: Self-pay | Admitting: Internal Medicine

## 2009-11-20 ENCOUNTER — Ambulatory Visit: Payer: Self-pay | Admitting: Cardiology

## 2009-12-10 ENCOUNTER — Ambulatory Visit (HOSPITAL_COMMUNITY): Admission: RE | Admit: 2009-12-10 | Discharge: 2009-12-10 | Payer: Self-pay | Admitting: Cardiology

## 2009-12-10 ENCOUNTER — Ambulatory Visit: Payer: Self-pay | Admitting: Cardiology

## 2009-12-10 ENCOUNTER — Ambulatory Visit: Payer: Self-pay

## 2009-12-10 ENCOUNTER — Encounter: Payer: Self-pay | Admitting: Cardiology

## 2009-12-10 ENCOUNTER — Ambulatory Visit: Payer: Self-pay | Admitting: Cardiovascular Disease

## 2009-12-10 DIAGNOSIS — I059 Rheumatic mitral valve disease, unspecified: Secondary | ICD-10-CM

## 2009-12-10 LAB — CONVERTED CEMR LAB: POC INR: 2.2

## 2009-12-11 LAB — CONVERTED CEMR LAB
ALT: 28 U/L
AST: 22 U/L
Albumin: 3.9 g/dL
Alkaline Phosphatase: 103 U/L
BUN: 23 mg/dL
Basophils Absolute: 0 K/uL
Basophils Relative: 0.5 %
Bilirubin, Direct: 0.1 mg/dL
CO2: 26 meq/L
Calcium: 9 mg/dL
Chloride: 111 meq/L
Cholesterol: 231 mg/dL — ABNORMAL HIGH
Creatinine, Ser: 0.9 mg/dL
Direct LDL: 161.9 mg/dL
Eosinophils Absolute: 0.1 K/uL
Eosinophils Relative: 1.9 %
GFR calc non Af Amer: 93.01 mL/min
Glucose, Bld: 93 mg/dL
HCT: 41.1 %
HDL: 48.9 mg/dL
Hemoglobin: 14.1 g/dL
Lymphocytes Relative: 28.2 %
Lymphs Abs: 1.3 K/uL
MCHC: 34.3 g/dL
MCV: 85.7 fL
Monocytes Absolute: 0.4 K/uL
Monocytes Relative: 9.4 %
Neutro Abs: 2.7 K/uL
Neutrophils Relative %: 60 %
Platelets: 156 K/uL
Potassium: 4.3 meq/L
RBC: 4.79 M/uL
RDW: 14.5 %
Sodium: 144 meq/L
Total Bilirubin: 0.7 mg/dL
Total CHOL/HDL Ratio: 5
Total Protein: 6.1 g/dL
Triglycerides: 99 mg/dL
VLDL: 19.8 mg/dL
WBC: 4.6 10*3/microliter

## 2010-01-07 ENCOUNTER — Ambulatory Visit: Payer: Self-pay | Admitting: Internal Medicine

## 2010-02-04 ENCOUNTER — Ambulatory Visit: Payer: Self-pay | Admitting: Cardiology

## 2010-03-12 ENCOUNTER — Ambulatory Visit: Payer: Self-pay | Admitting: Cardiovascular Disease

## 2010-03-12 LAB — CONVERTED CEMR LAB: POC INR: 2.7

## 2010-03-21 ENCOUNTER — Telehealth (INDEPENDENT_AMBULATORY_CARE_PROVIDER_SITE_OTHER): Payer: Self-pay | Admitting: *Deleted

## 2010-03-25 ENCOUNTER — Ambulatory Visit: Payer: Self-pay | Admitting: Cardiology

## 2010-04-15 ENCOUNTER — Ambulatory Visit: Payer: Self-pay | Admitting: Internal Medicine

## 2010-04-15 LAB — CONVERTED CEMR LAB: POC INR: 2.3

## 2010-04-22 ENCOUNTER — Encounter: Payer: Self-pay | Admitting: Pulmonary Disease

## 2010-05-13 ENCOUNTER — Ambulatory Visit: Payer: Self-pay | Admitting: Cardiology

## 2010-05-13 LAB — CONVERTED CEMR LAB: POC INR: 1.8

## 2010-05-28 ENCOUNTER — Ambulatory Visit: Payer: Self-pay | Admitting: Pulmonary Disease

## 2010-06-10 ENCOUNTER — Ambulatory Visit: Payer: Self-pay | Admitting: Cardiology

## 2010-06-10 LAB — CONVERTED CEMR LAB: POC INR: 2.1

## 2010-07-08 ENCOUNTER — Ambulatory Visit: Admission: RE | Admit: 2010-07-08 | Discharge: 2010-07-08 | Payer: Self-pay | Source: Home / Self Care

## 2010-08-05 ENCOUNTER — Ambulatory Visit: Admission: RE | Admit: 2010-08-05 | Discharge: 2010-08-05 | Payer: Self-pay | Source: Home / Self Care

## 2010-08-05 LAB — CONVERTED CEMR LAB: POC INR: 2.2

## 2010-08-05 NOTE — Medication Information (Signed)
Summary: rov/tm  Anticoagulant Therapy  Managed by: Shelby Dubin, PharmD, BCPS, CPP Referring MD: [name] MD Supervising MD: Shirlee Latch MD, Freida Busman Indication 1: Atrial Fibrillation (ICD-427.31) Lab Used: [facility] Site: [facility] INR POC 2.2 INR RANGE 2 - 2.5  Dietary changes: no    Health status changes: no    Bleeding/hemorrhagic complications: no    Recent/future hospitalizations: yes       Details: may have cataract surgery in coming months...wcb with details.  Any changes in medication regimen? no    Recent/future dental: no  Any missed doses?: no       Is patient compliant with meds? yes       Current Medications (verified): 1)  Aspirin 81 Mg Tbec (Aspirin) .... Take One Tablet By Mouth Daily 2)  Metoprolol Succinate 50 Mg Xr24h-Tab (Metoprolol Succinate) .... Take One Tablet By Mouth Daily 3)  Amlodipine Besylate 10 Mg Tabs (Amlodipine Besylate) .... Take1/2  Tablet By Mouth Daily 4)  Losartan Potassium 100 Mg Tabs (Losartan Potassium) .... Take 1 Tab By Mouth Once Daily.Marland KitchenMarland Kitchen 5)  Doxazosin Mesylate 8 Mg Tabs (Doxazosin Mesylate) .Marland Kitchen.. 1 By Mouth Daily. 6)  Welchol 625 Mg Tabs (Colesevelam Hcl) .Marland Kitchen.. 1 Tab By Mouth Daily 7)  Zetia 10 Mg Tabs (Ezetimibe) .... Take One Tablet By Mouth Daily. 8)  Coumadin 2.5 Mg Tabs (Warfarin Sodium) .... Use As Directed By Anticoagulation Clinic. 9)  Clotrimazole-Betamethasone 1-0.05 % Crea (Clotrimazole-Betamethasone) .... Apply To Rash Two Times A Day As Needed...  Allergies (verified): 1)  ! Iodine 2)  ! * Ivp Dye 3)  ! Statins  Anticoagulation Management History:      The patient is taking warfarin and comes in today for a routine follow up visit.  Positive risk factors for bleeding include an age of 65 years or older and history of CVA/TIA.  The bleeding index is 'intermediate risk'.  Positive CHADS2 values include History of HTN and Prior Stroke/CVA/TIA.  Negative CHADS2 values include Age > 57 years old.  The start date  was 05/21/2004.  His last INR was 4.6 RATIO.  Anticoagulation responsible provider: Shirlee Latch MD, [place].  INR POC: 2.2.  Cuvette Lot#: [id number].  Exp: 10/2010.    Anticoagulation Management Assessment/Plan:      The patient's current anticoagulation dose is Coumadin 2.5 mg tabs: Use as directed by anticoagulation clinic..  The target INR is 2 - 2.5.  The next INR is due 08/19/2009.  Anticoagulation instructions were given to patient.  Results were reviewed/authorized by Shelby Dubin, PharmD, BCPS, CPP.  He was notified by Shelby Dubin PharmD, BCPS, CPP.         Prior Anticoagulation Instructions: INR 2.1 Continue 1.25mg s everyday except 2.5mg s on Mondays, Wednesdays and Fridays. Recheck in 4 weeks.   Current Anticoagulation Instructions: INR 2.2  Continue 1 tab each Monday, Wednesday, Friday and 0.5 tab on all other days.  Recheck in 4 weeks.

## 2010-08-05 NOTE — Medication Information (Signed)
Summary: John [name]  Anticoagulant Therapy  Managed by: Cloyde Reams, RN, BSN Referring MD: [name] MD Supervising MD: Tenny Craw MD, Gunnar Fusi Indication 1: Atrial Fibrillation (ICD-427.31) Lab Used: [facility] Site: [facility] INR POC 2.1 INR RANGE 2 - 2.5  Dietary changes: no    Health status changes: no    Bleeding/hemorrhagic complications: no    Recent/future hospitalizations: no    Any changes in medication regimen? no    Recent/future dental: no  Any missed doses?: no       Is patient compliant with meds? yes       Allergies: 1)  ! Iodine 2)  ! * Ivp Dye 3)  ! Statins  Anticoagulation Management History:      The patient is taking warfarin and comes in today for a routine follow up visit.  Positive risk factors for bleeding include an age of 84 years or older and history of CVA/TIA.  The bleeding index is 'intermediate risk'.  Positive CHADS2 values include History of HTN and Prior Stroke/CVA/TIA.  Negative CHADS2 values include Age > 58 years old.  The start date was 05/21/2004.  His last INR was 4.6 RATIO.  Anticoagulation responsible provider: Tenny Craw MD, Gunnar Fusi.  INR POC: 2.1.  Cuvette Lot#: [id number].  Exp: 11/2010.    Anticoagulation Management Assessment/Plan:      The patient's current anticoagulation dose is Coumadin 2.5 mg tabs: Use as directed by anticoagulation clinic..  The target INR is 2 - 2.5.  The next INR is due 11/11/2009.  Anticoagulation instructions were given to patient.  Results were reviewed/authorized by Cloyde Reams, RN, BSN.  He was notified by Cloyde Reams RN.         Prior Anticoagulation Instructions: INR 2.7  Take 1/2 tablet tonight, then resume same dosage 1/2 tablet daily except 1 tablet on Mondays, Wednesdays, and Fridays.  Recheck in 4 weeks.    Current Anticoagulation Instructions: INR 2.1  Continue on same dosage 1/2 tablet daily except 1 tablet on Mondays, Wednesdays, and Fridays.  Recheck in 4 weeks.

## 2010-08-05 NOTE — Medication Information (Signed)
Summary: rov/tm  Anticoagulant Therapy  Managed by: Bethena Midget, RN, BSN Referring MD: [name] MD Supervising MD: Gala Romney MD, Reuel Boom Indication 1: Atrial Fibrillation (ICD-427.31) Lab Used: [facility] Site: [facility] INR POC 2.6 INR RANGE 2 - 2.5  Dietary changes: no    Health status changes: no    Bleeding/hemorrhagic complications: no    Recent/future hospitalizations: no    Any changes in medication regimen? no    Recent/future dental: no  Any missed doses?: no       Is patient compliant with meds? yes       Allergies: 1)  ! Iodine 2)  ! * Ivp Dye 3)  ! Statins  Anticoagulation Management History:      The patient is taking warfarin and comes in today for a routine follow up visit.  Positive risk factors for bleeding include an age of 73 years or older and history of CVA/TIA.  The bleeding index is 'intermediate risk'.  Positive CHADS2 values include History of HTN and Prior Stroke/CVA/TIA.  Negative CHADS2 values include Age > 73 years old.  The start date was 05/21/2004.  His last INR was 4.6 RATIO.  Anticoagulation responsible provider: Bensimhon MD, Reuel Boom.  INR POC: 2.6.  Cuvette Lot#: [id number].  Exp: 02/2011.    Anticoagulation Management Assessment/Plan:      The patient's current anticoagulation dose is Coumadin 2.5 mg tabs: Use as directed by anticoagulation clinic..  The target INR is 2 - 2.5.  The next INR is due 02/04/2010.  Anticoagulation instructions were given to patient.  Results were reviewed/authorized by Bethena Midget, RN, BSN.  He was notified by Bethena Midget, RN, BSN.         Prior Anticoagulation Instructions: INR 2.2 Continue 1.25mg s daily except 2.5mg s on Mondays, Wednesdays and Fridays. Recheck in 4 weeks.   Current Anticoagulation Instructions: INR 2.6 Continue 1.25mg s everyday except 2.5mg s on Mondays, Wednesdays and Fridays. Recheck in 4 weeks.

## 2010-08-05 NOTE — Medication Information (Signed)
Summary: rov/tm  Anticoagulant Therapy  Managed by: Bethena Midget, RN, BSN Referring MD: [name] MD Supervising MD: Patty Sermons Indication 1: Atrial Fibrillation (ICD-427.31) Lab Used: [facility] Site: [facility] INR POC 2.1 INR RANGE 2.0-3.0  Dietary changes: no    Health status changes: no    Bleeding/hemorrhagic complications: no    Recent/future hospitalizations: no    Any changes in medication regimen? no    Recent/future dental: no  Any missed doses?: no       Is patient compliant with meds? yes      Comments: Pt states he has been taking 2.5mg s on MWF since last visit  Allergies: 1)  ! Iodine 2)  ! * Ivp Dye 3)  ! Statins  Anticoagulation Management History:      The patient is taking warfarin and comes in today for a routine follow up visit.  Positive risk factors for bleeding include an age of 75 years or older and history of CVA/TIA.  The bleeding index is 'intermediate risk'.  Positive CHADS2 values include History of HTN and Prior Stroke/CVA/TIA.  Negative CHADS2 values include Age > 20 years old.  The start date was 05/21/2004.  His last INR was 4.6 RATIO.  Anticoagulation responsible [place]ah Pitones: [place].  INR POC: 2.1.  Cuvette Lot#: [id number].  Exp: 04/2011.    Anticoagulation Management Assessment/Plan:      The patient's current anticoagulation dose is Coumadin 2.5 mg tabs: Use as directed by anticoagulation clinic..  The target INR is 2.0-3.0.  The next INR is due 07/08/2010.  Anticoagulation instructions were given to patient.  Results were reviewed/authorized by Bethena Midget, RN, BSN.  He was notified by Bethena Midget, RN, BSN.         Prior Anticoagulation Instructions: INR 1.8 Today 2.5mg s then resume 1.25mg s everyday except 2.5mg s on Mondays and Fridays. Recheck in 4 weeks.   Current Anticoagulation Instructions: INR 2.1 Continue 1.25mg s everyday except 2.5mg  on Mondays, Wednesdays and Fridays. Recheck in 4 weeks.

## 2010-08-05 NOTE — Assessment & Plan Note (Signed)
Summary: F6M/DM      Allergies Added:   History of Present Illness: [name] is a pleasant gentleman who has a history of coronary artery disease status post coronary bypassing graft, as well as mitral valve repair in January 2009.  He had an echocardiogram performed on 06/25/08  that showed normal LV function.  There was a mitral annuloplasty ring, but there is no mitral regurgitation.  The patient  had an  Myoview performed on July 12, 2008.  This was normal with an ejection fraction of 66%. Last carotid Dopplers performed in October 2010 showed 40-59% bilateral stenosis. I last saw him in November of 2010. Since then the patient denies any dyspnea on exertion, orthopnea, PND, pedal edema, palpitations, syncope or chest pain.   Current Medications (verified): 1)  Aspirin 81 Mg Tbec (Aspirin) .... Take One Tablet By Mouth Daily 2)  Metoprolol Succinate 50 Mg Xr24h-Tab (Metoprolol Succinate) .... Take One Tablet By Mouth Daily 3)  Amlodipine Besylate 10 Mg Tabs (Amlodipine Besylate) .... Take1/2  Tablet By Mouth Daily 4)  Losartan Potassium 100 Mg Tabs (Losartan Potassium) .... Take 1 Tab By Mouth Once Daily.Marland KitchenMarland Kitchen 5)  Doxazosin Mesylate 8 Mg Tabs (Doxazosin Mesylate) .Marland Kitchen.. 1 By Mouth Daily. 6)  Welchol 625 Mg Tabs (Colesevelam Hcl) .Marland Kitchen.. 1 Tab By Mouth Daily 7)  Zetia 10 Mg Tabs (Ezetimibe) .... Take One Tablet By Mouth Daily. 8)  Coumadin 2.5 Mg Tabs (Warfarin Sodium) .... Use As Directed By Anticoagulation Clinic.  Allergies (verified): 1)  ! Iodine 2)  ! * Ivp Dye 3)  ! Statins  Past History:  Past Medical History: Hx of PULMONARY EMBOLISM (ICD-415.19) Hx of DEEP VENOUS THROMBOPHLEBITIS (ICD-453.40) ESSENTIAL HYPERTENSION, BENIGN (ICD-401.1) CAD, ARTERY BYPASS GRAFT (ICD-414.04) MITRAL VALVE REPLACEMENT, HX OF (ICD-V15.1) CAROTID STENOSIS (ICD-433.10) HYPERLIPIDEMIA-MIXED (ICD-272.4) Hx of GASTROESOPHAGEAL REFLUX DISEASE (ICD-530.81) Hx of HEMORRHOIDS (ICD-455.6) PROSTATE CANCER  (ICD-185) DEGENERATIVE JOINT DISEASE (ICD-715.90) SEBACEOUS CYST, NECK (ICD-706.2) SEBORRHEIC DERMATITIS (ICD-690.10)  Past Surgical History: Reviewed history from 04/25/2009 and no changes required. S/P T & A S/P vasectomy S/P prostate cancer surg- radical retropubic prostatectomy 5/98 by DrOttelin S/P CABG x2 & mitral valve repair 1/09 by DrOwen S/P bilat shoulder surgery in past S/P left knee arthroscopy by DrRendall  Social History: Reviewed history from 04/25/2009 and no changes required. Married, wife= John [name]  3 children- daughters Ex-smoker, quit 1970 Social alcohol retired  Review of Systems       no fevers or chills, productive cough, hemoptysis, dysphasia, odynophagia, melena, hematochezia, dysuria, hematuria, rash, seizure activity, orthopnea, PND, pedal edema, claudication. Remaining systems are negative.   Vital Signs:  Patient profile:   75 year old male Weight:      189 pounds Pulse rate:   68 / minute Pulse rhythm:   regular BP sitting:   142 / 60  (left arm) Cuff size:   large  Vitals Entered By: Deliah Goody, RN (Nov 20, 2009 7:54 AM)  Physical Exam  General:  Well-developed well-nourished in no acute distress.  Skin is warm and dry.  HEENT is normal.  Neck is supple. No thyromegaly.  Chest is clear to auscultation with normal expansion.  Cardiovascular exam is regular rate and rhythm. 2/6 systolic ejection murmur Abdominal exam nontender or distended. No masses palpated. Extremities show no edema. neuro grossly intact    EKG  Procedure date:  11/20/2009  Findings:      Normal sinus rhythm at a rate of 68. No ST changes.  Impression & Recommendations:  Problem # 1:  Hx of PULMONARY EMBOLISM (ICD-415.19) Continue Coumadin. His updated medication list for this problem includes:    Aspirin 81 Mg Tbec (Aspirin) .Marland Kitchen... Take one tablet by mouth daily    Coumadin 2.5 Mg Tabs (Warfarin sodium) ..... Use as directed by anticoagulation  clinic.  Problem # 2:  ESSENTIAL HYPERTENSION, BENIGN (ICD-401.1) Blood pressure controlled on present medications. Will continue. Check Bmet. His updated medication list for this problem includes:    Aspirin 81 Mg Tbec (Aspirin) .Marland Kitchen... Take one tablet by mouth daily    Metoprolol Succinate 50 Mg Xr24h-tab (Metoprolol succinate) .Marland Kitchen... Take one tablet by mouth daily    Amlodipine Besylate 10 Mg Tabs (Amlodipine besylate) .Marland Kitchen... Take1/2  tablet by mouth daily    Losartan Potassium 100 Mg Tabs (Losartan potassium) .Marland Kitchen... Take 1 tab by mouth once daily...    Doxazosin Mesylate 8 Mg Tabs (Doxazosin mesylate) .Marland Kitchen... 1 by mouth daily.  Problem # 3:  CAD, ARTERY BYPASS GRAFT (ICD-414.04) Continue aspirin, beta blocker and ARB. Intolerant to statins. Note he has a history of his left main off the right coronary cusp. His updated medication list for this problem includes:    Aspirin 81 Mg Tbec (Aspirin) .Marland Kitchen... Take one tablet by mouth daily    Metoprolol Succinate 50 Mg Xr24h-tab (Metoprolol succinate) .Marland Kitchen... Take one tablet by mouth daily    Amlodipine Besylate 10 Mg Tabs (Amlodipine besylate) .Marland Kitchen... Take1/2  tablet by mouth daily    Coumadin 2.5 Mg Tabs (Warfarin sodium) ..... Use as directed by anticoagulation clinic.  Problem # 4:  MITRAL VALVE REPLACEMENT, HX OF (ICD-V15.1) History of mitral valve repair. Continued SBE prophylaxis. Repeat echocardiogram. Orders: Echocardiogram (Echo)  Problem # 5:  COUMADIN THERAPY (ICD-V58.61) Monitored in Coumadin clinic. Goal INR 2 to 3. Check CBC.  Problem # 6:  CAROTID STENOSIS (ICD-433.10) Continue aspirin. Intolerant to statins. Followup carotid Dopplers October 2011. His updated medication list for this problem includes:    Aspirin 81 Mg Tbec (Aspirin) .Marland Kitchen... Take one tablet by mouth daily    Coumadin 2.5 Mg Tabs (Warfarin sodium) ..... Use as directed by anticoagulation clinic.  Problem # 7:  HYPERLIPIDEMIA-MIXED (ICD-272.4) Continue present  medications. Check lipids and liver. His updated medication list for this problem includes:    Welchol 625 Mg Tabs (Colesevelam hcl) .Marland Kitchen... 1 tab by mouth daily    Zetia 10 Mg Tabs (Ezetimibe) .Marland Kitchen... Take one tablet by mouth daily.  Patient Instructions: 1)  Your physician recommends that you schedule a follow-up appointment in: ONE YEAR 2)  Your physician recommends that you return for lab work IO:NGEX NEXT COUMADIN APPT 3)  Your physician has requested that you have an echocardiogram.  Echocardiography is a painless test that uses sound waves to create images of your heart. It provides your doctor with information about the size and shape of your heart and how well your heart's chambers and valves are working.  This procedure takes approximately one hour. There are no restrictions for this procedure.

## 2010-08-05 NOTE — Assessment & Plan Note (Signed)
Summary: follow up///JJ   CC:  Yearly ROV & review of mult medical problems....  History of Present Illness: 75 y/o WM here to for a follow up visit...  he has been expertly managed by DrCrenshaw & the Cardiology team w/ hx CAD- s/p CABG & MVR in 1/09 by DrOwen... he also sees DrOttelin regularly for f/u of his prostate cancer...   ~  April 25, 2009:  referred over by Cards for eval of knot noted in left post neck area and neck pain... he has a remote hx of neck pain & LBP w/ prev eval by DrRendall for Ortho & prev knee arthroscopy as well... exam shows a dorsal kyphosis and a small sebaceous cyst in the left post neck region- non-inflammed & non-tender... we decided to just watch this, and he already has f/u appt w/ DrRendall for next week.   ~  May 28, 2010:  stressful time for Milik- wife, [name], diagnosed w/ lung cancer (poorly diff adenoca)- getting chemoRx from DrMohammed... Tyrie has been doing well medically> saw DrCrenshaw 5/11 for f/u CAD, valvular heart dis, & Carotid dis- stable, no changes made, EKG looked OK; & 2DEcho showed norm LV w/ EF=55-60% & norm wall motion, s/p MV repair w/ mildMR, mildly calcif AoV but no stenosis, PAsys=40...  he saw DrOttelin 10/11 for f/u prostate ca> PSA= 0.11 & stable, they continue to follow regularly... he also had FLP done 6/11 per DrCrenshaw w/ TChol 231 & LDL= 162 on Welchol & Zetia as he is intol to all statins (no changes made)... he notes knee pain> followed by DrRendall & he was told bone on bone & needs TKR (s/p arthroscopic surg in past)... see prob list below- OK Flu shot today.   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  ESSENTIAL HYPERTENSION, BENIGN (ICD-401.1) - controlled on meds including: TOPROL XL 50mg /d,   AMLODIPINE 10mg - 1/2 tab daily,  LOSARTAN 100mg /d,  DOXAZOSIN 8mg /d...  BP= 138/64 & he denies HA, visual changes, CP, palipit, syncope, dyspnea, edema, etc...  ~  10/10:  DrCrenshaw decr the Amlodipine to 1/2 tab daily (due to postural changes) & we changed Avalide to LOSARTAN 100mg /d at his request...  CAD, ARTERY BYPASS GRAFT (ICD-414.04) - on ASA 81mg /d... followed by Aletta Edouard for Cards...  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  ~  Cath 1/09 showed this congenital anomaly, LAD was small, 40-50% stenosis of ramus branch, & luminal irregularities in CIRC, RCA normal, EF= 70%...  S/P CABG x2 & mitral valve repair 1/09 by DrOwen.  ~  Myoview 1/10 showed fair exercise capacity, hypertensive BP response, no scar or ischemia w/ EF= 66%...  Hx of MITRAL VALVE REPAIR (ICD-V15.1) & ? AORTIC STENOSIS, MILD (ICD-424.1) -   ~  2DEcho 12/09 showed norm LVF w/ EF= 55-60%, no regional wall motion problems, sl incr AoV thickness & triv AI, mitral annuloplasty ring, mod dil LA, & mild incr PA press.  ~  2DEcho showed norm LV w/ EF=55-60% & norm wall motion, s/p MV repair w/ mildMR, mildly calcif AoV but no stenosis, PAsys=40...   Hx of ATRIAL FIBRILLATION (ICD-427.31) & COUMADIN THERAPY (ICD-V58.61) - followed in the Coumadin Clinic... he had perioperative  AFib in 2009 and holding NSR since then w/ normal EKG...  CAROTID STENOSIS (ICD-433.10)  ~  f/u CDopplers 10/10 showed stable mild heterogenous plaque in both bulbs & bifurcations= 40-59% bilat ICA stenoses... f/u planned 21yr.  HYPERLIPIDEMIA-MIXED (ICD-272.4) - on [facility] 625mg /d, & ZETIA 10mg /d... he is INTOL to all STATIN drugs.  ~  FLP 10/09 showed TChol 244, TG 180, HDL 54, LDL 172  ~  FLP 7/10 showed TChol 217, TG 102, HDL 56, LDL 152  ~  FLP 6/11 per DrCrenshaw showed TChol 231, TG 99, HDL 49, LDL 162 & no changes made.  Hx of GASTROESOPHAGEAL  REFLUX DISEASE (ICD-530.81) - not currently on meds & he denies symptoms.  ~  s/p EGD 8/98 showed 3cmHH, mild reflux esoph, mild gastritis, mild duodenitis- Rx'd w/ PPI.  Hx of HEMORRHOIDS (ICD-455.6) - he had a FlexSig 8/98 by DrSamLeB which showed only hems, and later eval by DrGerkin in 1999 w/ Analpram cream Rx... he needs a screening colonoscopy but won't consider this at present due to wife's illness- he will call when ready.  PROSTATE CANCER (ICD-185) - diagnosed in 39 (age 81) w/ PSA= 7.8 & biopsies by DrOttelin +for prostate cancer... he opted for surgery- radical retropubic prostatectomy done 5/98 (w/ Gleason 5 adeno & pos margin at right apex, and he did well post op x for ED...  ~  9/10: he indicates that recent PSA f/u from DrOttelin showed a sl rise over his zero baseline...  ~  10/11: f/u DrOttelin w/ PSA= 0.11 & stable, they continue to follow regularly.  DEGENERATIVE JOINT DISEASE (ICD-715.90) - he has been followed by DrRendall et al... hx of prev bilat shoulder surgeries and left knee arthroscopy- he tells me it is bone on bone & needs TKr but holding off due to wife's illness... hx mild LBP & neck pain in the past but not prev evaluated...  NECK PAIN (ICD-723.1)  SEBACEOUS CYST, NECK (ICD-706.2) - small left post neck area cyst noted on exam 10/10- not inflammed or tender & we will just watch this. SEBORRHEIC DERMATITIS (ICD-690.10) - several areas on scalp noted & Rx for LOTRISONE Cream for Prn use.   Allergies: 1)  ! Iodine 2)  ! * Ivp Dye 3)  ! Statins  Comments:  Nurse/Medical Assistant: The patient's medications and allergies were reviewed with the patient and were updated in the Medication and Allergy Lists.  Past History:  Past Medical History: Hx of PULMONARY EMBOLISM (ICD-415.19) Hx of DEEP VENOUS THROMBOPHLEBITIS (ICD-453.40) ESSENTIAL HYPERTENSION, BENIGN (ICD-401.1) CAD, ARTERY BYPASS GRAFT (ICD-414.04) MITRAL VALVE REPLACEMENT, HX OF  (ICD-V15.1) CAROTID STENOSIS (ICD-433.10) HYPERLIPIDEMIA-MIXED (ICD-272.4) Hx of GASTROESOPHAGEAL REFLUX DISEASE (ICD-530.81) Hx of HEMORRHOIDS (ICD-455.6) PROSTATE CANCER (ICD-185) DEGENERATIVE JOINT DISEASE (ICD-715.90) SEBACEOUS CYST, NECK (ICD-706.2) SEBORRHEIC DERMATITIS (ICD-690.10)  Past Surgical History: S/P T & A S/P vasectomy S/P prostate cancer surg- radical retropubic prostatectomy 5/98 by DrOttelin S/P CABG x2 & mitral valve repair 1/09 by DrOwen S/P bilat shoulder surgery in past S/P left knee arthroscopy by DrRendall  Family History: Reviewed history from 04/25/2009 and no changes required. Father died age 42 w/ MI, hx of phlebitis Mother died age 20 w/ stroke & HBP 2 Siblings- brothers w/ HBP  Social History: Reviewed history from 04/25/2009 and no changes required. Married, wife= [name]  3 children- daughters Ex-smoker, quit 1970 Social alcohol retired  Review of Systems      See HPI  The patient denies anorexia, fever, weight loss, weight gain, vision loss, decreased hearing,  hoarseness, chest pain, syncope, dyspnea on exertion, peripheral edema, prolonged cough, headaches, hemoptysis, abdominal pain, melena, hematochezia, severe indigestion/heartburn, hematuria, incontinence, muscle weakness, suspicious skin lesions, transient blindness, difficulty walking, depression, unusual weight change, abnormal bleeding, enlarged lymph nodes, and angioedema.    Vital Signs:  Patient profile:   75 year old male Height:      69 inches Weight:      193 pounds BMI:     28.60 O2 Sat:      95 % on Room air Temp:     98.3 degrees F oral Pulse rate:   70 / minute BP sitting:   138 / 64  (left arm) Cuff size:   regular  Vitals Entered By: Randell Loop CMA (May 28, 2010 11:57 AM)  O2 Sat at Rest %:  95 O2 Flow:  Room air  Physical Exam  Additional Exam:  WD, WN, 75 y/o WM in NAD... GENERAL:  Alert & oriented; pleasant & cooperative. HEENT:  [place]/AT,  EOM-wnl, PERRLA, EACs-clear, TMs-wnl, NOSE-clear, THROAT-clear & wnl. NECK:  Supple w/ decrROM; no JVD; normal carotid impulses w/o bruits; no thyromegaly or nodules palpated; no lymphadenopathy. small sebaceous cyst left posterior neck area... CHEST:  Clear to P & A; without wheezes/ rales/ or rhonchi. HEART:  Regular Rhythm; without murmurs/ rubs/ or gallops heard... ABDOMEN:  Soft & nontender; normal bowel sounds; no organomegaly or masses detected. EXT: without deformities, mild arthritic changes; no varicose veins/ venous insuffic/ or edema. NEURO:  CN's intact; motor testing normal; sensory testing normal; gait normal & balance OK. DERM:  Seb cyst as noted, and few SK's and seb dermatitis on vertex...    MISC. Report  Procedure date:  05/28/2010  Findings:      DATA REVIEWED:    ~  EMR note 5/18 DrCrenshaw w/ EKG & 2DEcho 12/10/09...  ~  Office note from DrOttelin 04/22/10 reviewed w/ pt...  ~  lab summary sheet reviewed as well...   Impression & Recommendations:  Problem # 1:  Hx of PULMONARY EMBOLISM (ICD-415.19) Remote hx of DVT & PTE- recurrent so he has remmained on Coumadin via CC. His updated medication list for this problem includes:    Aspirin 81 Mg Tbec (Aspirin) .Marland Kitchen... Take one tablet by mouth daily    Coumadin 2.5 Mg Tabs (Warfarin sodium) ..... Use as directed by anticoagulation clinic.  Problem # 2:  ESSENTIAL HYPERTENSION, BENIGN (ICD-401.1) Controlled on meds>  continue same... His updated medication list for this problem includes:    Metoprolol Succinate 50 Mg Xr24h-tab (Metoprolol succinate) .Marland Kitchen... Take one tablet by mouth daily    Amlodipine Besylate 10 Mg Tabs (Amlodipine besylate) .Marland Kitchen... Take1/2  tablet by mouth daily    Losartan Potassium 100 Mg Tabs (Losartan potassium) .Marland Kitchen... Take 1 tab by mouth once daily...    Doxazosin Mesylate 8 Mg Tabs (Doxazosin mesylate) .Marland Kitchen... 1 by mouth daily.  Problem # 3:  MITRAL VALVE REPLACEMENT, HX OF (ICD-V15.1) He had 2  vessel CABG & MV repair 2009 by DrOwens... he continues f/u w/ DrCrenshaw & stable...  Problem # 4:  CAROTID STENOSIS (ICD-433.10) On ASA, Coumadin & followed by DrCrenshaw & the [facility]... His updated medication list for this problem includes:    Aspirin 81 Mg Tbec (Aspirin) .Marland Kitchen... Take one tablet by mouth daily    Coumadin 2.5 Mg Tabs (Warfarin sodium) ..... Use as directed by anticoagulation clinic.  Problem # 5:  HYPERLIPIDEMIA-MIXED (ICD-272.4) He is intol to all statins & has been m,aintained  on Welchol but only one per day per DrChrenshaw, + Zetia 10mg /d... I suggested that he slowly incr the number of Welchol tabs that he takes for better effect. His updated medication list for this problem includes:    Welchol 625 Mg Tabs (Colesevelam hcl) .Marland Kitchen... 1 tab by mouth daily    Zetia 10 Mg Tabs (Ezetimibe) .Marland Kitchen... Take one tablet by mouth daily.  Problem # 6:  PROSTATE CANCER (ICD-185) Followed by DrOttelin regularly...  Problem # 7:  DEGENERATIVE JOINT DISEASE (ICD-715.90) Followed by drRendall... His updated medication list for this problem includes:    Aspirin 81 Mg Tbec (Aspirin) .Marland Kitchen... Take one tablet by mouth daily  Problem # 8:  OTHER PROBLEMS AS NOTED>>>  Complete Medication List: 1)  Aspirin 81 Mg Tbec (Aspirin) .... Take one tablet by mouth daily 2)  Metoprolol Succinate 50 Mg Xr24h-tab (Metoprolol succinate) .... Take one tablet by mouth daily 3)  Amlodipine Besylate 10 Mg Tabs (Amlodipine besylate) .... Take1/2  tablet by mouth daily 4)  Losartan Potassium 100 Mg Tabs (Losartan potassium) .... Take 1 tab by mouth once daily.Marland KitchenMarland Kitchen 5)  Doxazosin Mesylate 8 Mg Tabs (Doxazosin mesylate) .Marland Kitchen.. 1 by mouth daily. 6)  Welchol 625 Mg Tabs (Colesevelam hcl) .Marland Kitchen.. 1 tab by mouth daily 7)  Zetia 10 Mg Tabs (Ezetimibe) .... Take one tablet by mouth daily. 8)  Coumadin 2.5 Mg Tabs (Warfarin sodium) .... Use as directed by anticoagulation clinic.  Other Orders: Influenza Vaccine MCR  (16109)  Patient Instructions: 1)  Today we updated your med list- see below.... 2)  Continue your current meds the same... 3)  We gave you the 2011 Flu vaccine today... 4)  Call for any problems...   Immunizations Administered:  Influenza Vaccine # 1:    Vaccine Type: Fluvax MCR    Site: right deltoid    Mfr: GlaxoSmithKline    Dose: 0.5 ml    Route: IM    Given by: Randell Loop CMA    Exp. Date: 01/03/2011    Lot #: [id number]    VIS given: 01/28/10 version given May 28, 2010.  Flu Vaccine Consent Questions:    Do you have a history of severe allergic reactions to this vaccine? no    Any prior history of allergic reactions to egg and/or gelatin? no    Do you have a sensitivity to the preservative Thimersol? no    Do you have a past history of Guillan-Barre Syndrome? no    Do you currently have an acute febrile illness? no    Have you ever had a severe reaction to latex? no    Vaccine information given and explained to patient? yes

## 2010-08-05 NOTE — Medication Information (Signed)
Summary: [name]  Anticoagulant Therapy  Managed by: Cloyde Reams, RN, BSN Referring MD: [name] MD Supervising MD: Gala Romney MD, Reuel Boom Indication 1: Atrial Fibrillation (ICD-427.31) Lab Used: [facility] Site: [facility] INR POC 2.3 INR RANGE 2 - 3  Dietary changes: no    Health status changes: no    Bleeding/hemorrhagic complications: no    Recent/future hospitalizations: no    Any changes in medication regimen? no    Recent/future dental: no  Any missed doses?: no       Is patient compliant with meds? yes       Allergies: 1)  ! Iodine 2)  ! * Ivp Dye 3)  ! Statins  Anticoagulation Management History:      The patient is taking warfarin and comes in today for a routine follow up visit.  Positive risk factors for bleeding include an age of 75 years or older and history of CVA/TIA.  The bleeding index is 'intermediate risk'.  Positive CHADS2 values include History of HTN and Prior Stroke/CVA/TIA.  Negative CHADS2 values include Age > 22 years old.  The start date was 05/21/2004.  His last INR was 4.6 RATIO.  Anticoagulation responsible provider: Prudie Guthridge MD, Reuel Boom.  INR POC: 2.3.  Cuvette Lot#: [id number].  Exp: 05/2011.    Anticoagulation Management Assessment/Plan:      The patient's current anticoagulation dose is Coumadin 2.5 mg tabs: Use as directed by anticoagulation clinic..  The target INR is 2.0-3.0.  The next INR is due 05/13/2010.  Anticoagulation instructions were given to patient.  Results were reviewed/authorized by Cloyde Reams, RN, BSN.  He was notified by Cloyde Reams RN.         Prior Anticoagulation Instructions: INR 2.7  Continue taking 1/2 tablet everyday except take 1 tablet on Mondays and Fridays. Re-check INR in 3 weeks.   Current Anticoagulation Instructions: INR 2.3  Continue on same dosage 1/2 tablet daily except 1 tablet on Mondays and Fridays.  Recheck in 4 weeks.

## 2010-08-05 NOTE — Medication Information (Signed)
Summary: rov/eac  Anticoagulant Therapy  Managed by: Cloyde Reams, RN, BSN Referring MD: [name] MD Supervising MD: Myrtis Ser MD, Tinnie Gens Indication 1: Atrial Fibrillation (ICD-427.31) Lab Used: [facility] Site: [facility] INR POC 2.7 INR RANGE 2 - 2.5  Dietary changes: no    Health status changes: no    Bleeding/hemorrhagic complications: no    Recent/future hospitalizations: no    Any changes in medication regimen? no    Recent/future dental: no  Any missed doses?: no       Is patient compliant with meds? yes       Allergies: 1)  ! Iodine 2)  ! * Ivp Dye 3)  ! Statins  Anticoagulation Management History:      The patient is taking warfarin and comes in today for a routine follow up visit.  Positive risk factors for bleeding include an age of 43 years or older and history of CVA/TIA.  The bleeding index is 'intermediate risk'.  Positive CHADS2 values include History of HTN and Prior Stroke/CVA/TIA.  Negative CHADS2 values include Age > 46 years old.  The start date was 05/21/2004.  His last INR was 4.6 RATIO.  Anticoagulation responsible provider: Myrtis Ser MD, Tinnie Gens.  INR POC: 2.7.  Cuvette Lot#: [id number].  Exp: 11/2010.    Anticoagulation Management Assessment/Plan:      The patient's current anticoagulation dose is Coumadin 2.5 mg tabs: Use as directed by anticoagulation clinic..  The target INR is 2 - 2.5.  The next INR is due 10/14/2009.  Anticoagulation instructions were given to patient.  Results were reviewed/authorized by Cloyde Reams, RN, BSN.  He was notified by Cloyde Reams RN.         Prior Anticoagulation Instructions: INR 2.0  Continue current dosing schedule.  Take 1 tablet (2.5 mg) on Monday, Wednesday, and Friday, and take 1/2 tablet (1.25 mg) all other days. Return to clinic in 4 weeks.  Current Anticoagulation Instructions: INR 2.7  Take 1/2 tablet tonight, then resume same dosage 1/2 tablet daily except 1 tablet on Mondays, Wednesdays, and  Fridays.  Recheck in 4 weeks.

## 2010-08-05 NOTE — Medication Information (Signed)
Summary: rov..mp  Anticoagulant Therapy  Managed by: Eda Keys, PharmD Referring MD: [name] MD Supervising MD: Gala Romney MD, Reuel Boom Indication 1: Atrial Fibrillation (ICD-427.31) Lab Used: [facility] Site: [facility] INR POC 2.0 INR RANGE 2 - 2.5  Dietary changes: no    Health status changes: no    Bleeding/hemorrhagic complications: no    Recent/future hospitalizations: no    Any changes in medication regimen? no    Recent/future dental: no  Any missed doses?: no       Is patient compliant with meds? yes       Current Medications (verified): 1)  Aspirin 81 Mg Tbec (Aspirin) .... Take One Tablet By Mouth Daily 2)  Metoprolol Succinate 50 Mg Xr24h-Tab (Metoprolol Succinate) .... Take One Tablet By Mouth Daily 3)  Amlodipine Besylate 10 Mg Tabs (Amlodipine Besylate) .... Take1/2  Tablet By Mouth Daily 4)  Losartan Potassium 100 Mg Tabs (Losartan Potassium) .... Take 1 Tab By Mouth Once Daily.Marland KitchenMarland Kitchen 5)  Doxazosin Mesylate 8 Mg Tabs (Doxazosin Mesylate) .Marland Kitchen.. 1 By Mouth Daily. 6)  Welchol 625 Mg Tabs (Colesevelam Hcl) .Marland Kitchen.. 1 Tab By Mouth Daily 7)  Zetia 10 Mg Tabs (Ezetimibe) .... Take One Tablet By Mouth Daily. 8)  Coumadin 2.5 Mg Tabs (Warfarin Sodium) .... Use As Directed By Anticoagulation Clinic. 9)  Clotrimazole-Betamethasone 1-0.05 % Crea (Clotrimazole-Betamethasone) .... Apply To Rash Two Times A Day As Needed...  Allergies (verified): 1)  ! Iodine 2)  ! * Ivp Dye 3)  ! Statins  Anticoagulation Management History:      The patient is taking warfarin and comes in today for a routine follow up visit.  Positive risk factors for bleeding include an age of 75 years or older and history of CVA/TIA.  The bleeding index is 'intermediate risk'.  Positive CHADS2 values include History of HTN and Prior Stroke/CVA/TIA.  Negative CHADS2 values include Age > 73 years old.  The start date was 05/21/2004.  His last INR was 4.6 RATIO.  Anticoagulation responsible  provider: Dejon Lukas MD, Reuel Boom.  INR POC: 2.0.  Cuvette Lot#: [id number].  Exp: 10/2010.    Anticoagulation Management Assessment/Plan:      The patient's current anticoagulation dose is Coumadin 2.5 mg tabs: Use as directed by anticoagulation clinic..  The target INR is 2 - 2.5.  The next INR is due 09/16/2009.  Anticoagulation instructions were given to patient.  Results were reviewed/authorized by Eda Keys, PharmD.  He was notified by Eda Keys.         Prior Anticoagulation Instructions: INR 2.2  Continue 1 tab each Monday, Wednesday, Friday and 0.5 tab on all other days.  Recheck in 4 weeks.    Current Anticoagulation Instructions: INR 2.0  Continue current dosing schedule.  Take 1 tablet (2.5 mg) on Monday, Wednesday, and Friday, and take 1/2 tablet (1.25 mg) all other days. Return to clinic in 4 weeks.

## 2010-08-05 NOTE — Medication Information (Signed)
Summary: rov/sp  Anticoagulant Therapy  Managed by: Eda Keys, PharmD Referring MD: [name] MD Supervising MD: [name] MD, Theron Arista Indication 1: Atrial Fibrillation (ICD-427.31) Lab Used: [facility] Site: [facility] INR POC 2.7 INR RANGE 2 - 2.5  Dietary changes: no    Health status changes: no    Bleeding/hemorrhagic complications: no    Recent/future hospitalizations: no    Any changes in medication regimen? no    Recent/future dental: no  Any missed doses?: no       Is patient compliant with meds? yes       Allergies: 1)  ! Iodine 2)  ! * Ivp Dye 3)  ! Statins  Anticoagulation Management History:      Positive risk factors for bleeding include an age of 75 years or older and history of CVA/TIA.  The bleeding index is 'intermediate risk'.  Positive CHADS2 values include History of HTN and Prior Stroke/CVA/TIA.  Negative CHADS2 values include Age > 51 years old.  The start date was 05/21/2004.  His last INR was 4.6 RATIO.  Anticoagulation responsible provider: [name] MD, Theron Arista.  INR POC: 2.7.  Cuvette Lot#: [id number].  Exp: 04/2011.    Anticoagulation Management Assessment/Plan:      The patient's current anticoagulation dose is Coumadin 2.5 mg tabs: Use as directed by anticoagulation clinic..  The target INR is 2 - 2.5.  The next INR is due 03/25/2010.  Anticoagulation instructions were given to patient.  Results were reviewed/authorized by Eda Keys, PharmD.  He was notified by Kennieth Francois.         Prior Anticoagulation Instructions: INR 2.8  Skip today's dose of Coumadin then resume same dose of 1/2 tablet every day except 1 tablet on Monday, Wednesday and Friday.   Current Anticoagulation Instructions: INR 2.7  Take one-half tablet every day except for one tablet on Mondays and Fridays.  We will see you in 2 weeks at 9:15

## 2010-08-05 NOTE — Medication Information (Signed)
Summary: rov/tm  Anticoagulant Therapy  Managed by: Weston Brass, PharmD Referring MD: [name] MD Supervising MD: [name] MD, Arlys John Indication 1: Atrial Fibrillation (ICD-427.31) Lab Used: [facility] Site: [facility] INR POC 2.8 INR RANGE 2 - 2.5  Dietary changes: no    Health status changes: no    Bleeding/hemorrhagic complications: no    Recent/future hospitalizations: no    Any changes in medication regimen? no    Recent/future dental: no  Any missed doses?: no       Is patient compliant with meds? yes       Allergies: 1)  ! Iodine 2)  ! * Ivp Dye 3)  ! Statins  Anticoagulation Management History:      The patient is taking warfarin and comes in today for a routine follow up visit.  Positive risk factors for bleeding include an age of 75 years or older and history of CVA/TIA.  The bleeding index is 'intermediate risk'.  Positive CHADS2 values include History of HTN and Prior Stroke/CVA/TIA.  Negative CHADS2 values include Age > 82 years old.  The start date was 05/21/2004.  His last INR was 4.6 RATIO.  Anticoagulation responsible provider: [name] MD, Arlys John.  INR POC: 2.8.  Cuvette Lot#: [id number].  Exp: 04/2011.    Anticoagulation Management Assessment/Plan:      The patient's current anticoagulation dose is Coumadin 2.5 mg tabs: Use as directed by anticoagulation clinic..  The target INR is 2 - 2.5.  The next INR is due 03/05/2010.  Anticoagulation instructions were given to patient.  Results were reviewed/authorized by Weston Brass, PharmD.  He was notified by Weston Brass PharmD.         Prior Anticoagulation Instructions: INR 2.6 Continue 1.25mg s everyday except 2.5mg s on Mondays, Wednesdays and Fridays. Recheck in 4 weeks.   Current Anticoagulation Instructions: INR 2.8  Skip today's dose of Coumadin then resume same dose of 1/2 tablet every day except 1 tablet on Monday, Wednesday and Friday.

## 2010-08-05 NOTE — Medication Information (Signed)
Summary: rov/jm  Anticoagulant Therapy  Managed by: Bethena Midget, RN, BSN Referring MD: [name] MD Supervising MD: Shirlee Latch MD, Cresencia Asmus Indication 1: Atrial Fibrillation (ICD-427.31) Lab Used: [facility] Site: [facility] INR POC 2.7 INR RANGE 2 - 3  Dietary changes: no    Health status changes: no    Bleeding/hemorrhagic complications: no    Recent/future hospitalizations: no    Any changes in medication regimen? no    Recent/future dental: no  Any missed doses?: no       Is patient compliant with meds? yes      Comments: Previous INR range in the computer was 2-2.5, but per Dr. Ludwig Clarks note in May 2011, range is 2-3. Pt denies hx of bleeding. Given new range, patient has been therapeutic and within range for several visits now.   Allergies: 1)  ! Iodine 2)  ! * Ivp Dye 3)  ! Statins  Anticoagulation Management History:      The patient is taking warfarin and comes in today for a routine follow up visit.  Positive risk factors for bleeding include an age of 32 years or older and history of CVA/TIA.  The bleeding index is 'intermediate risk'.  Positive CHADS2 values include History of HTN and Prior Stroke/CVA/TIA.  Negative CHADS2 values include Age > 60 years old.  The start date was 05/21/2004.  His last INR was 4.6 RATIO.  Anticoagulation responsible provider: Shirlee Latch MD, Zaccheaus Storlie.  INR POC: 2.7.  Cuvette Lot#: [id number].  Exp: 04/2011.    Anticoagulation Management Assessment/Plan:      The patient's current anticoagulation dose is Coumadin 2.5 mg tabs: Use as directed by anticoagulation clinic..  The target INR is 2.0-3.0.  The next INR is due 04/15/2010.  Anticoagulation instructions were given to patient.  Results were reviewed/authorized by Bethena Midget, RN, BSN.  He was notified by Harrel Carina, PharmD candidate.         Prior Anticoagulation Instructions: INR 2.7  Take one-half tablet every day except for one tablet on Mondays and Fridays.  We will see  you in 2 weeks at 9:15  Current Anticoagulation Instructions: INR 2.7  Continue taking 1/2 tablet everyday except take 1 tablet on Mondays and Fridays. Re-check INR in 3 weeks.

## 2010-08-05 NOTE — Medication Information (Signed)
Summary: rov/sp  Anticoagulant Therapy  Managed by: Bethena Midget, RN, BSN Referring MD: [name] MD Supervising MD: [name] MD, Theron Arista Indication 1: Atrial Fibrillation (ICD-427.31) Lab Used: [facility] Site: [facility] INR POC 2.2 INR RANGE 2 - 2.5  Dietary changes: no    Health status changes: no    Bleeding/hemorrhagic complications: no    Recent/future hospitalizations: no    Any changes in medication regimen? no    Recent/future dental: no  Any missed doses?: no       Is patient compliant with meds? yes       Allergies: 1)  ! Iodine 2)  ! * Ivp Dye 3)  ! Statins  Anticoagulation Management History:      The patient is taking warfarin and comes in today for a routine follow up visit.  Positive risk factors for bleeding include an age of 75 years or older and history of CVA/TIA.  The bleeding index is 'intermediate risk'.  Positive CHADS2 values include History of HTN and Prior Stroke/CVA/TIA.  Negative CHADS2 values include Age > 73 years old.  The start date was 05/21/2004.  His last INR was 4.6 RATIO.  Anticoagulation responsible provider: [name] MD, Theron Arista.  INR POC: 2.2.  Cuvette Lot#: [id number].  Exp: 02/2011.    Anticoagulation Management Assessment/Plan:      The patient's current anticoagulation dose is Coumadin 2.5 mg tabs: Use as directed by anticoagulation clinic..  The target INR is 2 - 2.5.  The next INR is due 01/07/2010.  Anticoagulation instructions were given to patient.  Results were reviewed/authorized by Bethena Midget, RN, BSN.  He was notified by Bethena Midget, RN, BSN.         Prior Anticoagulation Instructions: INR 2.8  Take 1/2 tablet today then resume same dose of 1/2 tablet every day except 1 tablet on Monday, Wednesday and Friday   Current Anticoagulation Instructions: INR 2.2 Continue 1.25mg s daily except 2.5mg s on Mondays, Wednesdays and Fridays. Recheck in 4 weeks.

## 2010-08-05 NOTE — Medication Information (Signed)
Summary: rov/ewj  Anticoagulant Therapy  Managed by: Bethena Midget, RN, BSN Referring MD: [name] MD Supervising MD: Myrtis Ser MD, Tinnie Gens Indication 1: Atrial Fibrillation (ICD-427.31) Lab Used: [facility] Site: [facility] INR POC 1.8 INR RANGE 2.0-2.5  Dietary changes: no    Health status changes: no    Bleeding/hemorrhagic complications: no    Recent/future hospitalizations: no    Any changes in medication regimen? no    Recent/future dental: no  Any missed doses?: no       Is patient compliant with meds? yes      Comments: Pt states his range was lowered to 2-2.5 a few mts ago.   Allergies: 1)  ! Iodine 2)  ! * Ivp Dye 3)  ! Statins  Anticoagulation Management History:      The patient is taking warfarin and comes in today for a routine follow up visit.  Positive risk factors for bleeding include an age of 75 years or older and history of CVA/TIA.  The bleeding index is 'intermediate risk'.  Positive CHADS2 values include History of HTN and Prior Stroke/CVA/TIA.  Negative CHADS2 values include Age > 74 years old.  The start date was 05/21/2004.  His last INR was 4.6 RATIO.  Anticoagulation responsible Lorey Pallett: Myrtis Ser MD, Tinnie Gens.  INR POC: 1.8.  Cuvette Lot#: [id number].  Exp: 06/2011.    Anticoagulation Management Assessment/Plan:      The patient's current anticoagulation dose is Coumadin 2.5 mg tabs: Use as directed by anticoagulation clinic..  The target INR is 2.0-2.5.  The next INR is due 06/10/2010.  Anticoagulation instructions were given to patient.  Results were reviewed/authorized by Bethena Midget, RN, BSN.  He was notified by Bethena Midget, RN, BSN.         Prior Anticoagulation Instructions: INR 2.3  Continue on same dosage 1/2 tablet daily except 1 tablet on Mondays and Fridays.  Recheck in 4 weeks.    Current Anticoagulation Instructions: INR 1.8 Today 2.5mg s then resume 1.25mg s everyday except 2.5mg s on Mondays and Fridays. Recheck in 4 weeks.    Appended Document: [facility]    Anticoagulant Therapy  Managed by: Bethena Midget, RN, BSN Referring MD: [name] MD Supervising MD: Graciela Husbands MD, Viviann Spare Indication 1: Atrial Fibrillation (ICD-427.31) Lab Used: [facility] Site: [facility] INR RANGE 2.0-3.0          Comments: Pt assumed that his range changed after his open heart Sx to 2.0-2.5 range. After Pharm D researched she couldn't find any documentation of such. But in Dr Ludwig Clarks office note in 11/2009 states INR 2-3 thus range changed to such.   Allergies: 1)  ! Iodine 2)  ! * Ivp Dye 3)  ! Statins  Anticoagulation Management History:      Positive risk factors for bleeding include an age of 90 years or older and history of CVA/TIA.  The bleeding index is 'intermediate risk'.  Positive CHADS2 values include History of HTN and Prior Stroke/CVA/TIA.  Negative CHADS2 values include Age > 12 years old.  The start date was 05/21/2004.  His last INR was 4.6 RATIO.  Anticoagulation responsible Kory Rains: Graciela Husbands MD, Viviann Spare.    Anticoagulation Management Assessment/Plan:      The patient's current anticoagulation dose is Coumadin 2.5 mg tabs: Use as directed by anticoagulation clinic..  The target INR is 2.0-3.0.  The next INR is due 06/10/2010.  Anticoagulation instructions were given to patient.  Results were reviewed/authorized by Bethena Midget, RN, BSN.  Prior Anticoagulation Instructions: INR 1.8 Today 2.5mg s then resume 1.25mg s everyday except 2.5mg s on Mondays and Fridays. Recheck in 4 weeks.

## 2010-08-05 NOTE — Progress Notes (Signed)
Summary: cold/ congestion- ok for augmentin and mucinex max  Phone Note Call from Patient Call back at Home Phone [phone]   Caller: Patient Call For: nadel Summary of Call: pt c/o congestion w/ cough- darkish green x 3-4 days. denies fever. no N or V. wants to know if he should be seen today? (wife is undergoing chemo). [facility] [place]  Initial call taken by: Tivis Ringer, CNA,  March 21, 2010 10:11 AM  Follow-up for Phone Call        Pt c/o chest congestion with productive cough and green phlegm x 3 days. Pt denies fever. Pt has been taking tylenol with no relief. Pt wife is on chem and he is concerned about her getting sick. Pt last seen 04-2009. Please advise. Carron Curie CMA  March 21, 2010 11:06 AM allergies: iodine, ivp dye, statins  Additional Follow-up for Phone Call Additional follow up Details #1::        per SN---augmentin 875mg   #14  1 by mouth two times a day and use the mucinex max 1 by mouth two times a day with plenty of fluids. thanks Randell Loop CMA  March 21, 2010 11:14 AM   called spoke with patient, advised of SN's recs as stated above.  pt verbalized his understanding.  appt scheduled with SN 11.23.11 @ 12N.  rx sent to pt's verified pharmacy. Additional Follow-up by: Boone Master CNA/MA,  March 21, 2010 11:37 AM    New/Updated Medications: AUGMENTIN 875-125 MG TABS (AMOXICILLIN-POT CLAVULANATE) Take 1 tablet by mouth two times a day until gone Prescriptions: AUGMENTIN 875-125 MG TABS (AMOXICILLIN-POT CLAVULANATE) Take 1 tablet by mouth two times a day until gone  #14 x 0   Entered by:   Boone Master CNA/MA   Authorized by:   Michele Mcalpine MD   Signed by:   Boone Master CNA/MA on 03/21/2010   Method used:   [facility]ronically to        [facility] [address]       [address]       [place], [place]  [address]       Ph: [phone] or [phone]       Fax: [phone]   RxID:   [id number]

## 2010-08-05 NOTE — Letter (Signed)
Summary: [facility]  [facility]   Imported By: Lennie Odor 05/01/2010 12:11:14  _____________________________________________________________________  External Attachment:    Type:   Image     Comment:   External Document

## 2010-08-05 NOTE — Medication Information (Signed)
Summary: rov/ewj  Anticoagulant Therapy  Managed by: Weston Brass, PharmD Referring MD: [name] MD Supervising MD: Tenny Craw MD, Gunnar Fusi Indication 1: Atrial Fibrillation (ICD-427.31) Lab Used: [facility] Site: [facility] INR POC 2.8 INR RANGE 2 - 2.5  Dietary changes: no    Health status changes: no    Bleeding/hemorrhagic complications: no    Recent/future hospitalizations: no    Any changes in medication regimen? no    Recent/future dental: no  Any missed doses?: no       Is patient compliant with meds? yes       Allergies: 1)  ! Iodine 2)  ! * Ivp Dye 3)  ! Statins  Anticoagulation Management History:      The patient is taking warfarin and comes in today for a routine follow up visit.  Positive risk factors for bleeding include an age of 73 years or older and history of CVA/TIA.  The bleeding index is 'intermediate risk'.  Positive CHADS2 values include History of HTN and Prior Stroke/CVA/TIA.  Negative CHADS2 values include Age > 44 years old.  The start date was 05/21/2004.  His last INR was 4.6 RATIO.  Anticoagulation responsible provider: Tenny Craw MD, Gunnar Fusi.  INR POC: 2.8.  Cuvette Lot#: [id number].  Exp: 11/2010.    Anticoagulation Management Assessment/Plan:      The patient's current anticoagulation dose is Coumadin 2.5 mg tabs: Use as directed by anticoagulation clinic..  The target INR is 2 - 2.5.  The next INR is due 12/09/2009.  Anticoagulation instructions were given to patient.  Results were reviewed/authorized by Weston Brass, PharmD.  He was notified by Weston Brass PharmD.         Prior Anticoagulation Instructions: INR 2.1  Continue on same dosage 1/2 tablet daily except 1 tablet on Mondays, Wednesdays, and Fridays.  Recheck in 4 weeks.    Current Anticoagulation Instructions: INR 2.8  Take 1/2 tablet today then resume same dose of 1/2 tablet every day except 1 tablet on Monday, Wednesday and Friday

## 2010-08-07 NOTE — Medication Information (Signed)
Summary: rov/tm  Anticoagulant Therapy  Managed by: Cloyde Reams, RN, BSN Referring MD: [name] MD Supervising MD: [name] MD, Theron Arista Indication 1: Atrial Fibrillation (ICD-427.31) Lab Used: [facility] Site: [facility] INR POC 2.1 INR RANGE 2.0-3.0  Dietary changes: no    Health status changes: no    Bleeding/hemorrhagic complications: no    Recent/future hospitalizations: no    Any changes in medication regimen? no    Recent/future dental: no  Any missed doses?: no       Is patient compliant with meds? yes       Allergies: 1)  ! Iodine 2)  ! * Ivp Dye 3)  ! Statins  Anticoagulation Management History:      The patient is taking warfarin and comes in today for a routine follow up visit.  Positive risk factors for bleeding include an age of 75 years or older and history of CVA/TIA.  The bleeding index is 'intermediate risk'.  Positive CHADS2 values include History of HTN and Prior Stroke/CVA/TIA.  Negative CHADS2 values include Age > 73 years old.  The start date was 05/21/2004.  His last INR was 4.6 RATIO.  Anticoagulation responsible provider: [name] MD, Theron Arista.  INR POC: 2.1.  Cuvette Lot#: [id number].  Exp: 04/2011.    Anticoagulation Management Assessment/Plan:      The patient's current anticoagulation dose is Coumadin 2.5 mg tabs: Use as directed by anticoagulation clinic..  The target INR is 2.0-3.0.  The next INR is due 08/05/2010.  Anticoagulation instructions were given to patient.  Results were reviewed/authorized by Cloyde Reams, RN, BSN.  He was notified by Cloyde Reams RN.         Prior Anticoagulation Instructions: INR 2.1 Continue 1.25mg s everyday except 2.5mg  on Mondays, Wednesdays and Fridays. Recheck in 4 weeks.   Current Anticoagulation Instructions: INR 2.1  Continue on same dosage 1/2 tablet daily except 1 tablet on Mondays, Wednesdays, and Fridays.  Recheck in 4 weeks.

## 2010-08-13 NOTE — Medication Information (Signed)
Summary: rov/ewj   Anticoagulant Therapy  Managed by: Weston Brass, PharmD Referring MD: [name] MD Supervising MD: Shirlee Latch MD, Freida Busman Indication 1: Atrial Fibrillation (ICD-427.31) Lab Used: [facility] Site: [facility] INR POC 2.2 INR RANGE 2.0-3.0  Dietary changes: no    Health status changes: no    Bleeding/hemorrhagic complications: no    Recent/future hospitalizations: no    Any changes in medication regimen? no    Recent/future dental: no  Any missed doses?: no       Is patient compliant with meds? yes       Allergies: 1)  ! Iodine 2)  ! * Ivp Dye 3)  ! Statins  Anticoagulation Management History:      The patient is taking warfarin and comes in today for a routine follow up visit.  Positive risk factors for bleeding include an age of 75 years or older and history of CVA/TIA.  The bleeding index is 'intermediate risk'.  Positive CHADS2 values include History of HTN and Prior Stroke/CVA/TIA.  Negative CHADS2 values include Age > 72 years old.  The start date was 05/21/2004.  His last INR was 4.6 RATIO.  Anticoagulation responsible provider: Shirlee Latch MD, Josalin Carneiro.  INR POC: 2.2.  Cuvette Lot#: [id number].  Exp: 04/2011.    Anticoagulation Management Assessment/Plan:      The patient's current anticoagulation dose is Coumadin 2.5 mg tabs: Use as directed by anticoagulation clinic..  The target INR is 2.0-3.0.  The next INR is due 09/02/2010.  Anticoagulation instructions were given to patient.  Results were reviewed/authorized by Weston Brass, PharmD.  He was notified by Linward Headland, PharmD candidate.         Prior Anticoagulation Instructions: INR 2.1  Continue on same dosage 1/2 tablet daily except 1 tablet on Mondays, Wednesdays, and Fridays.  Recheck in 4 weeks.    Current Anticoagulation Instructions: INR 2.2 (INR goal: 2-3)  Take 1/2 tablet everyday except 1 tablet on Mondays, Wednesdays, and Fridays.  Recheck in 4 weeks.

## 2010-08-31 DIAGNOSIS — Z7901 Long term (current) use of anticoagulants: Secondary | ICD-10-CM

## 2010-08-31 DIAGNOSIS — I82409 Acute embolism and thrombosis of unspecified deep veins of unspecified lower extremity: Secondary | ICD-10-CM

## 2010-08-31 DIAGNOSIS — I2699 Other pulmonary embolism without acute cor pulmonale: Secondary | ICD-10-CM

## 2010-09-03 ENCOUNTER — Encounter: Payer: Self-pay | Admitting: Cardiology

## 2010-09-03 ENCOUNTER — Encounter (INDEPENDENT_AMBULATORY_CARE_PROVIDER_SITE_OTHER): Payer: Medicare Other

## 2010-09-03 DIAGNOSIS — Z7901 Long term (current) use of anticoagulants: Secondary | ICD-10-CM

## 2010-09-03 DIAGNOSIS — I4891 Unspecified atrial fibrillation: Secondary | ICD-10-CM

## 2010-09-03 LAB — CONVERTED CEMR LAB: POC INR: 2.4

## 2010-09-11 NOTE — Medication Information (Signed)
Summary: rov/sp  Anticoagulant Therapy  Managed by: Leota Sauers, PharmD, BCPS, CPP Referring MD: [name] MD Supervising MD: Shirlee Latch MD, Hadlei Stitt Indication 1: Atrial Fibrillation (ICD-427.31) Lab Used: [facility] Site: [facility] INR POC 2.4 INR RANGE 2.0-3.0  Dietary changes: no    Health status changes: no    Bleeding/hemorrhagic complications: no    Recent/future hospitalizations: no    Any changes in medication regimen? no    Recent/future dental: no  Any missed doses?: no       Is patient compliant with meds? yes       Current Medications (verified): 1)  Aspirin 81 Mg Tbec (Aspirin) .... Take One Tablet By Mouth Daily 2)  Metoprolol Succinate 50 Mg Xr24h-Tab (Metoprolol Succinate) .... Take One Tablet By Mouth Daily 3)  Amlodipine Besylate 10 Mg Tabs (Amlodipine Besylate) .... Take1/2  Tablet By Mouth Daily 4)  Losartan Potassium 100 Mg Tabs (Losartan Potassium) .... Take 1 Tab By Mouth Once Daily.Marland KitchenMarland Kitchen 5)  Doxazosin Mesylate 8 Mg Tabs (Doxazosin Mesylate) .Marland Kitchen.. 1 By Mouth Daily. 6)  Welchol 625 Mg Tabs (Colesevelam Hcl) .Marland Kitchen.. 1 Tab By Mouth Daily 7)  Zetia 10 Mg Tabs (Ezetimibe) .... Take One Tablet By Mouth Daily. 8)  Coumadin 2.5 Mg Tabs (Warfarin Sodium) .... Use As Directed By Anticoagulation Clinic.  Allergies: 1)  ! Iodine 2)  ! * Ivp Dye 3)  ! Statins  Anticoagulation Management History:      The patient is taking warfarin and comes in today for a routine follow up visit.  Positive risk factors for bleeding include an age of 44 years or older and history of CVA/TIA.  The bleeding index is 'intermediate risk'.  Positive CHADS2 values include History of HTN and Prior Stroke/CVA/TIA.  Negative CHADS2 values include Age > 75 years old.  The start date was 05/21/2004.  His last INR was 4.6 RATIO.  Anticoagulation responsible provider: Shirlee Latch MD, Dakari Cregger.  INR POC: 2.4.  Cuvette Lot#: [id number].  Exp: 04/2011.    Anticoagulation Management Assessment/Plan:  The patient's current anticoagulation dose is Coumadin 2.5 mg tabs: Use as directed by anticoagulation clinic..  The target INR is 2.0-3.0.  The next INR is due 10/01/2010.  Anticoagulation instructions were given to patient.  Results were reviewed/authorized by Leota Sauers, PharmD, BCPS, CPP.         Prior Anticoagulation Instructions: INR 2.2 (INR goal: 2-3)  Take 1/2 tablet everyday except 1 tablet on Mondays, Wednesdays, and Fridays.  Recheck in 4 weeks.    Current Anticoagulation Instructions: INR 2.4  Coumadin 2.5mg  tab 1 tab on MON, WED, FRI 1/2 tab on TUE, THUR, SAT and SUN

## 2010-10-01 ENCOUNTER — Ambulatory Visit (INDEPENDENT_AMBULATORY_CARE_PROVIDER_SITE_OTHER): Payer: Medicare Other | Admitting: *Deleted

## 2010-10-01 DIAGNOSIS — I2699 Other pulmonary embolism without acute cor pulmonale: Secondary | ICD-10-CM

## 2010-10-01 DIAGNOSIS — Z7901 Long term (current) use of anticoagulants: Secondary | ICD-10-CM

## 2010-10-01 DIAGNOSIS — I82409 Acute embolism and thrombosis of unspecified deep veins of unspecified lower extremity: Secondary | ICD-10-CM

## 2010-10-01 NOTE — Patient Instructions (Signed)
INR 2.2  Continue current dose:  1 tablet on Monday, Wednesday, Friday, 1/2 tablet the rest of the days.  Recheck INR in 4  weeks

## 2010-10-29 ENCOUNTER — Ambulatory Visit (INDEPENDENT_AMBULATORY_CARE_PROVIDER_SITE_OTHER): Payer: Medicare Other | Admitting: *Deleted

## 2010-10-29 DIAGNOSIS — Z7901 Long term (current) use of anticoagulants: Secondary | ICD-10-CM

## 2010-10-29 DIAGNOSIS — I82409 Acute embolism and thrombosis of unspecified deep veins of unspecified lower extremity: Secondary | ICD-10-CM

## 2010-10-29 DIAGNOSIS — I2699 Other pulmonary embolism without acute cor pulmonale: Secondary | ICD-10-CM

## 2010-10-29 LAB — POCT INR: INR: 2.4

## 2010-11-05 ENCOUNTER — Telehealth: Payer: Self-pay | Admitting: *Deleted

## 2010-11-05 MED ORDER — DOXAZOSIN MESYLATE 8 MG PO TABS: 8.0000 mg | ORAL_TABLET | Freq: Every day | ORAL | Status: DC

## 2010-11-05 MED ORDER — AMLODIPINE BESYLATE 10 MG PO TABS: 5.0000 mg | ORAL_TABLET | Freq: Every day | ORAL | Status: DC

## 2010-11-05 MED ORDER — METOPROLOL SUCCINATE ER 50 MG PO TB24: 50.0000 mg | ORAL_TABLET | Freq: Every day | ORAL | Status: DC

## 2010-11-05 MED ORDER — LOSARTAN POTASSIUM 100 MG PO TABS: 100.0000 mg | ORAL_TABLET | Freq: Every day | ORAL | Status: DC

## 2010-11-05 MED ORDER — EZETIMIBE 10 MG PO TABS: 10.0000 mg | ORAL_TABLET | Freq: Every day | ORAL | Status: DC

## 2010-11-05 NOTE — Telephone Encounter (Signed)
Pt returned [employer]' call to verify pharmacy.  Prescription solutions.  Refills e-scribed.  Judithe Modest, CMA

## 2010-11-18 NOTE — Cardiovascular Report (Signed)
NAMEGRAINGER, MCCARLEY NO.:  [id number]   MEDICAL RECORD NO.:  [id number]          PATIENT TYPE:  OIB   LOCATION:  1960                         FACILITY:  [facility]   PHYSICIAN:  Rollene Rotunda, MD, FACCDATE OF BIRTH:  [birth year]   DATE OF PROCEDURE:  07/12/2007  DATE OF DISCHARGE:                            CARDIAC CATHETERIZATION   PRIMARY:  __________   CARDIOLOGIST:  Dr. [name].   PROCEDURE:  Left and right heart catheterization/coronary arteriography.   INDICATIONS:  Evaluate a patient with mitral regurgitation, dyspnea.  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  He is being  considered for mitral valve repair.   PROCEDURE NOTE:  Left heart catheterization was performed via right  femoral artery, right heart catheterization performed via the right  femoral vein.  Both vessels were cannulated using anterior wall  puncture.  A #4-French arterial sheath and a #7-French venous sheath  were inserted via the modified Seldinger technique.  Preformed Judkins,  pigtail and Swan-Ganz catheters utilized.  Of note, I cannulated the  anomalous left main off the right coronary with an AL-2.   RESULTS:  Hemodynamic:  RA mean 4, RV 31/3 with a mean of 5, PA 27/8  with a mean of 16, pulmonary capillary wedge pressure mean of 7, LV  137/15, AO 137/87, cardiac output/cardiac index (Fick) 4.9/2.5.   Coronaries:  The left main arises as a separate ostia from the right  coronary cusp.  It appears to pass anterior to the pulmonary artery.  This was best judged by the RAO cranial angulation.  The left main was  free of disease.  The LAD was a small vessel.  There appeared to be  about 40-50% stenosis after the takeoff of the ramus intermediate,  although again this was a small vessel distal to this.  Circumflex in  the AV groove had luminal irregularities.  Ramus intermediate was large  and normal.  Mid obtuse marginal was large and normal.  There were 2  posterolaterals which were small to moderate-sized and normal.  Right  coronary artery was a dominant vessel.  It was normal throughout its  course.  The PDA was large and normal.   LEFT VENTRICULOGRAM:  Left ventriculogram was obtained in the RAO  projection.  The EF was 70%.  I was unable to adequately assess the  mitral regurgitation.  It did not appear to be severe but the left  ventricle was slightly underfilled with this injection.  Aortic root  injections were done x2 to evaluate the congenital anatomy.  Aside from  the congenital anomaly there were no root abnormalities.   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  There is mitral  regurgitation which is difficult to quantify with this test.   PLAN:  Discuss with Dr. [name].  Would plan a cardiac CT to further  evaluate the takeoff of the left main.      Rollene Rotunda, MD, [facility]  Electronically Signed     JH/MEDQ  D:  07/12/2007  T:  07/12/2007  Job:  [id number]   cc:   [name]. Kriste Basque, MD

## 2010-11-18 NOTE — Assessment & Plan Note (Signed)
[facility]                            CARDIOLOGY OFFICE NOTE   MERRIC, YOST                         MRN:          [id number]  DATE:08/31/2007                            DOB:          Jan 17, 1936    HISTORY:  Mr. Teagle returns for followup today.  He was recently  admitted to [facility] for mitral valve repair.  Note he did  not have obstructive coronary disease prior to the procedure but a chest  CT did show the left main arising from the right coronary cusp.  It  coursed between the aorta and pulmonary artery.  The patient underwent  mitral valve repair as well as coronary artery bypassing graft with a  LIMA to the LAD and saphenous vein graft to the first and second  circumflex marginal on July 26, 2007.  Of note, he also has mild  aortic stenosis.  Postoperatively he did have atrial fibrillation but it  converted on amiodarone.  Also, since discharge he apparently has had a  pleural effusion and underwent thoracentesis by Dr. Cornelius Moras.  He has not  had chest pain and he states his dyspnea is much better after his  thoracentesis.  He does not have palpitations or syncope and there is no  pedal edema.   MEDICATIONS:  1. Include Coumadin as directed.  2. Cardura 8 mg p.o. daily.  3. Zetia 10 mg p.o. daily.  4. Niaspan 500 mg p.o. daily.  5. Toprol 50 mg p.o. daily.  6. Welchol 625 mg p.o. daily.  7. Aspirin 81 mg p.o. daily.  8. Avapro 300 mg p.o. daily.  9. Amiodarone 200 mg p.o. daily.   PHYSICAL EXAM:  VITAL SIGNS:  Today shows a blood pressure of 180/74 and  his pulse is 72.  He weighs 180 pounds.  HEENT:  Normal.  NECK:  Supple.  CHEST:  Shows diminished breath sounds in the left lower lobe.  CARDIOVASCULAR:  Reveals a regular rate and rhythm.  There is no murmur  noted.  The sternotomy is evaluated and with no evidence of infection.  ABDOMEN:  Exam shows no tenderness.  EXTREMITIES:  Show no edema.   His  electrocardiogram shows atrial fibrillation at a rate of 64.  The  axis is normal.  There are nonspecific ST changes.   DIAGNOSES:  1. Status post mitral valve repair - the patient will continue with      SBE prophylaxis.  I will see him back in 8 weeks and we will most      likely schedule an echocardiogram after that to have a baseline      study following his mitral valve surgery.  2. History of mild aortic stenosis.  3. 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  This      was performed due to the left main coursing between the aorta and      pulmonary artery.  4. Hypertension - his blood pressure is elevated  today.  Note his      Avalide had been discontinued in favor of Avapro and his Norvasc      was discontinued at the time of his discharge as his blood pressure      was somewhat low.  I have asked him to discontinue his Avapro and      we will resume his Avalide at 300/12.5 daily.  I will check a BMET      in 1 week to follow his potassium and renal function.  If his blood      pressure continues to be elevated then we will resume his Norvasc.  5. Hyperlipidemia - he is complaining of flushing and we will      discontinue his Niaspan.  Note he has not tolerated statins in the      past.  6. History of dye allergy.  7. Cerebrovascular disease - he will need followup carotid Dopplers in      October of 2009.  8. History of deep venous thrombosis/pulmonary - he will continue on      his Coumadin.  9. Postoperative atrial fibrillation - he remains in sinus rhythm.  We      will most likely discontinue his amiodarone in 8 weeks when I see      him back if he remains in sinus rhythm.     [name] [name], MD, [facility]  Electronically Signed    BSC/MedQ  DD: 08/31/2007  DT: 08/31/2007  Job #: [id number]

## 2010-11-18 NOTE — Discharge Summary (Signed)
NAMESHAMUS, John [name] NO.:  [id number]   MEDICAL RECORD NO.:  [id number]          PATIENT TYPE:  OBV   LOCATION:  3733                         FACILITY:  [facility]   PHYSICIAN:  [name]. [name], MD, FACCDATE OF BIRTH:  12/15/1935   DATE OF ADMISSION:  06/25/2008  DATE OF DISCHARGE:  06/26/2008                               DISCHARGE SUMMARY   PRIMARY CARDIOLOGIST:  Theron Arista C. [name], MD, [facility].   PRIMARY CARE PHYSICIAN:  [name]. Kriste Basque, MD.   PROCEDURES PERFORMED DURING THE HOSPITALIZATION:  1. Echocardiogram.  a:  This was read by Dr. [name] on June 25, 2008, revealing  overall left ventricular systolic function normal, left ventricular  ejection fraction estimated range being 55-60% with no diagnostic left  ventricular regional wall motion abnormality, aortic valve thickness was  mildly increased with trivial aortic valve regurgitation.  There was  mitral annuloplasty ring.  The left atrium was moderately dilated.  The  estimated peak pulmonary systolic pressure was mildly increased.  1. VQ lung scan revealing low probability.   FINAL DISCHARGE DIAGNOSES:  1. Noncardiac chest pain.  2. 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      a.     Status post coronary artery bypass grafting with LIMA to LAD       in January 2009 with SVG to first and second marginal of the       circumflex at that time.      b.     Status post mitral valve repair with a 26-mm [employer]       MEMO 3D ring annuloplasty January 2009.  3. Status post left knee surgery in October 2009.  4. Hypertension.  5. Hyperlipidemia, intolerant to statins.  6. History of pulmonary embolus with deep vein thrombosis on chronic      Coumadin therapy.   HISTORY OF PRESENT ILLNESS:  This is a very pleasant 75 year old  Caucasian male with known history of coronary anomaly with his left main  to the right coronary cusp status post LIMA to LAD and SVG to  first and  second marginal of the circumflex in 2009 with mitral valve stenosis and  mitral valve repair at that time who was admitted with complaints of  chest pain and soreness that had been going on since recent left knee  surgery in October.  The patient described constant soreness, but he has  noticed more shortness of breath and chest pressure over the last 2  weeks.  The patient called our office and was advised to come to the  emergency room for further evaluation.  The patient was seen and  examined by Dr. Charlton Haws and myself and VQ lung scan was completed  to rule out PE.  VQ lung scan showed low probability.  The patient had  subsequent echocardiogram to evaluate mitral valve and it is read by Dr.  [name] and found to have no new abnormalities.  Please see Dr.  Ludwig Clarks echocardiogram for more details with results as described  above.  The patient had cardiac enzymes cycled during hospitalization to  rule out ischemia and all were found to be negative x3.  The following  day the patient was seen and examined by Dr. [name] and found to  be stable believing that the pain was more related to musculoskeletal,  and the patient will be discharged home on previous medications with an  outpatient Myoview followup and then seen by Dr. [name] thereafter.  The patient was without complaint throughout hospitalization and vital  signs remained stable.   DISCHARGE LABORATORIES:  Hemoglobin 13.2, hematocrit 40.5, white blood  cells 5.1, platelets 177.  Cardiac enzymes negative x3.  D-dimer 0.27.  Sodium 140, potassium 4.2, chloride 110, glucose 153, BUN 26, creatinine  1.2.   VITAL SIGNS ON DISCHARGE:  Blood pressure 114/68, heart rate 66,  respirations 15, O2 sat 99% on room air, temperature 97.3.   DISCHARGE MEDICATIONS:  1. Coumadin 2.5 mg Mondays, Wednesdays, Fridays, and Sundays, 1.25 mg      on Thursdays and Saturdays.  2. Metoprolol XL 50 mg daily.  3.  Cardura 8 mg daily.  4. Zetia 10 mg daily.  5. Avalide 300/12.5 mg daily.  6. WelChol 625 mg daily.  7. Norvasc 10 mg daily.  8. Multivitamin daily.   ALLERGIES/INTOLERANCE:  STATIN and IVP DYE.   FOLLOWUP PLANS AND APPOINTMENT:  1. The patient will follow up in our office for a stress Myoview.  Our      office will call to make that appointment as it is before the      office opens morning.  2. The patient will follow up with Dr. [name] for evaluation      and further discussion of test results and continued cardiac      management.  3. The patient is to follow up with Dr. Kriste Basque for continued medical      management.   Time spent with the patient to include physician time 30 minutes.      Bettey Mare. Lyman Bishop, NP      [name]. [name], MD, [facility]  Electronically Signed    KML/MEDQ  D:  06/26/2008  T:  06/26/2008  Job:  [id number]   cc:   [name]. Kriste Basque, MD

## 2010-11-18 NOTE — Assessment & Plan Note (Signed)
[facility]                            CARDIOLOGY OFFICE NOTE   AREND, BAHL                         MRN:          [id number]  DATE:08/02/2008                            DOB:          Oct 09, 1935    John [name] is a pleasant gentleman who has a history of coronary artery  disease status post coronary bypassing graft, as well as mitral valve  repair in January 2009.  He was recently admitted to [facility]  with complaints of chest pain.  A V/Q scan with low probability.  He  ruled out for myocardial infarction with serial enzymes.  He had an  echocardiogram performed during that admission that showed normal LV  function.  There was a mitral annuloplasty ring, but there is no mention  of mitral regurgitation.  The patient subsequently had an outpatient  Myoview performed on July 12, 2008.  This was normal with an ejection  fraction of 66%.  Since that time he states he has had no further chest  pain and there is no dyspnea, palpitations, or syncope.  There is no  pedal edema.   MEDICATIONS AT PRESENT:  1. Coumadin as directed.  2. Cardura 8 mg p.o. daily.  3. Zetia 10 mg p.o. daily.  4. Toprol 50 mg p.o. daily.  5. Avalide 300/12.5 mg p.o. daily.  6. WelChol 6.25 mg p.o. daily.  7. Amlodipine 10 mg p.o. daily.  8. Multivitamin.   PHYSICAL EXAMINATION:  VITAL SIGNS:  Blood pressure of 156/78 and his  pulse is 88.  He weighs 187 pounds.  HEENT:  Normal.  NECK:  Supple.  CHEST:  Clear.  CARDIOVASCULAR:  Regular rate and rhythm.  ABDOMEN:  No tenderness.  EXTREMITIES:  No edema.   DIAGNOSES:  1. Status post mitral valve repair - John [name] is doing well from      symptomatic standpoint.  He will continue with subacute bacterial      endocarditis prophylaxis.  2. History of mild aortic stenosis - his most recent echocardiogram      did not show this.  3. History of left main off the right coronary cusps status post      coronary  artery bypassing graft - the patient will continue on his      beta-blocker.  We will also add enteric-coated aspirin 81 mg p.o.      daily.  4. Hypertension - his blood pressure is elevated today, but he has not      taken his Toprol.  We will track this and adjust his regimen as      indicated.  5. Hyperlipidemia - he will continue on his Zetia and Welchol.  Note,      he has not tolerated statins in the past, but he does not think he      has tried Crestor.  We will try 5 mg p.o. daily.  If he tolerates,      we will check lipids and liver and adjust as indicated.  6. History of DYE allergy.  7. Cerebrovascular disease - he  will need followup carotid Dopplers in      October of this year.  8. History of deep venous thrombosis/pulmonary embolus - he will      continue on his Coumadin and this is being monitored at our      Coumadin Clinic.  9. History of postoperative atrial fibrillation - he is in sinus      rhythm on examination today.   I will see him back in 6 months.     [name] [name], MD, [facility]  Electronically Signed    BSC/MedQ  DD: 08/02/2008  DT: 08/02/2008  Job #: [id number]

## 2010-11-18 NOTE — Op Note (Signed)
[name], John [name] NO.:  [id number]   MEDICAL RECORD NO.:  [id number]          PATIENT TYPE:  INP   LOCATION:  2310                         FACILITY:  [facility]   PHYSICIAN:  Janetta Hora. Frederick, M.D.DATE OF BIRTH:  [birth year]   DATE OF PROCEDURE:  07/26/2007  DATE OF DISCHARGE:                               OPERATIVE REPORT   PROCEDURE:  Intraoperative transesophageal echocardiography.   PHYSICIAN:  Dr. Janetta Hora. Frederick.   INDICATIONS FOR PROCEDURE:  I was consulted by Dr. Salvatore Decent. Cornelius Moras to  provide intraoperative echocardiography services for his patient, Mr.  Christopher.  I met Mr. Steen in the preoperative holding area at [facility] Operating Room.  An informed consent for the operative  procedure and related anesthetic and transesophageal echocardiography  was obtained.   DESCRIPTION OF PROCEDURE:  Invasive monitors were placed in the holding  area under local anesthesia.  The patient was taken to the operating  room where he underwent the induction of general anesthesia without  significant change in his vital signs.  He tolerated the induction well.   The transesophageal echocardiography probe was passed easily to a depth  of 40 cm.  The views obtained and archived for review of interested  parties.  A short axis view of the left ventricle revealed an ejection  fraction of about 50%.  The left ventricular wall was slightly increased  in mass.  A lateral view was consistent with the short axis view.  The  probe was then withdrawn for a four-chamber view.  The aortic valve was  tricuspid in nature.  It had a small amount of turbulence across its  flow during ejection and a trace of aortic insufficiency in diastole.   The remaining chambers of the heart were examined.  The tricuspid valve  had a trace of regurgitation, otherwise was normal.  The atrial septum  was intact.  The left atrial appendage was clear.   Attention was then  diverted to the mitral valve apparatus.  Color flow  Doppler revealed regurgitation along the anterior cusp due to a  prolapsed middle segment of the posterior cuff.  Under the hemodynamic  conditions at the time the regurgitant fraction I would say was 2-3+.  These findings were shared with Dr. Cornelius Moras.   The probe was then placed in a transventricular view, in order to  monitor fluid status pre-bypass.  The probe was disengaged during the  bypass.  The probe was re-engaged for assistance in disengaging from  bypass.  The probe was used to facilitate air maneuvers.  There was very  little air noted in the left side of the heart.  After coming off of  bypass, the valve structures were examined.  The valve was competent.  The presence of the mitral valve ring was evident.  The revised leaflets  of the mitral valve apparatus were noted and recorded for evaluation.  There were no changes in the remaining structures of the heart, and the  probe was used to optimize fluid status and pressor use in the post-  bypass period.   The patient tolerated the procedure well.  The probe was disengaged at  the end of the operative procedure, before transfer to the intensive  care unit.           ______________________________  Janetta Hora. Gelene Mink, M.D.     CEF/MEDQ  D:  07/26/2007  T:  07/26/2007  Job:  [id number]

## 2010-11-18 NOTE — Consult Note (Signed)
NEW PATIENT CONSULTATION   John [name], John [name]  DOB:  1935/10/03                                        July 11, 2007  CHART #:  [id number]   REQUESTING PHYSICIAN:  Dr. [name].   PRIMARY CARE PHYSICIAN:  Dr. Alroy Dust.   REASON FOR CONSULTATION:  Mitral regurgitation.   HISTORY OF PRESENT ILLNESS:  [name] is a 75 year old gentleman from  [place], who has been followed by Dr. [name] for the last couple of  years with mitral regurgitation.  The patient's past medical history is  also notable for a history of palpitations, possibly secondary to  supraventricular tachycardia, hypertension, hyperlipidemia, and remote  history of pulmonary embolus with deep venous thrombosis.  Over the last  year or so, the patient has developed progressive symptoms of exertional  shortness of breath.  He returned to see Dr. [name] on May 10, 2007, at which time he was noted to have a murmur on physical exam,  consistent with mitral regurgitation.  A 2D echocardiogram performed in  October showed normal left ventricular size and function.  However,  there was mitral valve prolapse with at least moderate-to-severe mitral  regurgitation.  There is also some question of mild aortic stenosis.  The patient subsequently underwent transesophageal echocardiogram to  further evaluate this.  This was performed on June 15, 2007.  The  patient was indeed found to have mitral valve prolapse with severe  mitral regurgitation.  There is trivial aortic stenosis.  Left  ventricular size and function remains normal.  [name] has now been  referred to consider possible elective surgical intervention.   REVIEW OF SYSTEMS:  GENERAL:  The patient reports normal appetite.  He  has not been gaining or losing weight.  VITAL SIGNS:  He is 5 foot, 9 inches tall, weighs approximately 180  pounds.  The patient does report progressive exertional fatigue.  CARDIAC:  The patient  describes progressive symptoms of exertional  shortness of breath that are also at times associated with mild chest  tightness or chest pressure.  The patient has had some dizzy spells, but  he denies any syncopal episodes.  He has had a history of palpitations  in the past, although this has not been problematic recently.  His  shortness of breath has gotten to the point where now he gets short of  breath with relatively mild physical activity, and this is limiting his  lifestyle considerably.  He denies any PND, orthopnea or significant  lower extremity edema.  RESPIRATORY:  Notable for recent upper respiratory tract infection that  has resolved.  The patient denies productive cough, hemoptysis,  wheezing.  GASTROINTESTINAL:  Negative.  The patient has no difficulty swallowing.  He denies hematochezia, hematemesis, melena.  GENITOURINARY:  Negative.  The patient has denied urinary urgency,  frequency or dysuria.  The patient has had some gross hematuria in the  distant past, associated with blood thinner level too high on Coumadin.  PERIPHERAL VASCULAR:  Negative.  The patient denies symptoms suggestive  of claudication.  The patient has had problems with deep venous  thrombosis in the distant past.  NEUROLOGIC:  Negative.  The patient has had some dizzy spells recently,  associated with shortness of breath.  The patient has not had syncopal  episodes.  The patient denies transient  monocular blindness or transient  numbness or weakness involving either upper or lower extremity.  MUSCULOSKELETAL:  Negative.  The patient has had some problems with his  shoulders in the past but not recently.  He otherwise denies significant  arthritis or arthralgias.  PSYCHIATRIC:  Negative.  HEENT:  Negative.  The patient has had some decreased sensory hearing  loss, both ears.  This has been slow and not acute in onset.  The  patient sees his dentist regularly, has not had dental problems   recently.  HEMATOLOGICAL:  Notable for remote history of pulmonary embolus and deep  venous thrombosis, for which he has remained on Coumadin for many years.  He has not had any major bleeding complications on Coumadin, other than  some mild gross hematuria intermittently.   PAST MEDICAL HISTORY:  1. Mitral regurgitation.  2. Aortic stenosis (trivial).  3. Hypertension.  4. Hyperlipidemia.  5. Pulmonary embolism in the distant past.  6. Deep venous thrombosis in the distant past.  7. Prostate cancer, status post prostatectomy.   PAST SURGICAL HISTORY:  1. Radical prostatectomy approximately 12 years ago.  2. Right shoulder surgery x1.  3. Left shoulder surgery x2.   FAMILY HISTORY:  Noncontributory.   SOCIAL HISTORY:  The patient is married and lives with his wife here in  [place].  He is retired, having previously worked for [employer].  He has three grown children and numerous grandchildren.  He remains  active physically.  He has a remote history of tobacco use, but he quit  smoking in 1971.  He denies significant alcohol consumption.   CURRENT MEDICATIONS:  1. Warfarin 2.5 mg daily (last dose one week ago).  2. Avalide 300/12.5 one tablet daily.  3. Cardura 8 mg daily.  4. Zetia 10 mg daily.  5. Niaspan 500 mg daily.  6. Toprol XL 50 mg daily.  7. Norvasc 10 mg daily.  8. Welchol 625 mg daily.  9. Aspirin 81 mg daily.   DRUG ALLERGIES:  Statin medications have caused severe muscle aches, and  the patient has a remote history of allergy to IV contrast   PHYSICAL EXAMINATION:  General:  The patient is a well-appearing male  who appears his stated age, no acute distress.  Vital Signs:  Blood  pressure is 135/66, pulse 69 regular, oxygen saturation 95% on room air.  HEENT:  Within normal limits.  There is good dentition.  Neck:  Supple.  There is no cervical, no supraclavicular lymphadenopathy.  There is no  jugular venous distention.  No carotid bruits are  noted.  Chest:  Auscultation of the chest reveals clear breath sounds which are  symmetric bilaterally.  No wheezes or rhonchi demonstrated.  Cardiovascular:  Notable for regular rate and rhythm.  There is a  prominent grade 3/5 holosystolic murmur, heard best along the left  anterior chest wall, with radiation across the precordium and to the  axilla.  No diastolic murmurs are noted.  Abdomen:  Soft, nondistended,  nontender.  There are no palpable masses.  The liver edge is not  palpable.  Bowel sounds are present.  Extremities:  Warm and well  perfused.  There is trace bilateral lower extremity edema.  Distal  pulses are not palpable in either lower leg at the ankle.  There is no  sign of significant venous insufficiency.  Skin:  Clean, dry, healthy-  appearing throughout.  Rectal/Genitourinary:  Both deferred.   DIAGNOSTIC TEST:  Transesophageal echocardiogram performed by Dr.  Crenshaw on June 15, 2007 is reviewed.  This demonstrates prolapse  involving the middle scallop (P2) of the posterior leaflet of the mitral  valve with severe (4+) mitral regurgitation.  The jet of mitral  regurgitation courses anteriorly along the atrial surface of the  anterior leaflet of the mitral valve and circles the left atrium.  Left  ventricular size and function appears normal.  There is trivial aortic  stenosis.  The aortic valve is tricuspid.  There is mild sclerosis in  the commissures of the aortic valve, but all three leaflets open and  close normally, and there is no significant aortic insufficiency.  Of  note, the aortic root is somewhat small in diameter as well, which may  further exacerbate the tendency for a measurable gradient across the  valve by transthoracic echocardiogram.  There is trace tricuspid  regurgitation.  No other significant abnormalities are noted.   IMPRESSION:  Mitral valve prolapse with severe mitral regurgitation.  Mr. Bechtol valve has classical appearance of  fibroelastic deficiency  and should be fairly straightforward to repair.  I do not feel that  anything will need to be done to his aortic valve.   PLAN:  I have discussed options at length with Mr. Kuang and his wife,  here in the office today.  Alternative treatment strategies have been  discussed.  He has tentatively planned to have left and right heart  catheterization tomorrow, through the [facility] team.  Depending upon results of catheterization, we will determine whether  concomitant coronary artery bypass grafting may need to be performed.  We tentatively plan to proceed with surgery on Tuesday, January 20,  pending cath results.  We have discussed a variety of surgical  alternatives, including conventional median sternotomy versus minimally  invasive techniques.  All of the questions have been addressed.  Mr.  Danielski plans to return for further followup and discussions prior to  surgery, to review his cath film results on January 12.   Salvatore Decent. Cornelius Moras, M.D.  Electronically Signed   CHO/MEDQ  D:  07/11/2007  T:  07/11/2007  Job:  [id number]   cc:   [name]. [name], MD, [facility]  Scott M. Kriste Basque, MD

## 2010-11-18 NOTE — Assessment & Plan Note (Signed)
[facility]                            CARDIOLOGY OFFICE NOTE   John, [name]                         MRN:          [id number]  DATE:10/19/2007                            DOB:          1935-09-22    HISTORY OF PRESENT ILLNESS:  John [name] is a pleasant 75 year old  gentleman who is status post recent mitral valve repair.  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  The LAD coursed between the aorta  and pulmonary artery.  Therefore, he had a LIMA to LAD, saphenous vein  graft to the first and second circumflex marginals.  He also has mild  aortic stenosis.  He also had postoperative atrial fibrillation.  Since  I last saw him, he denies any dyspnea, chest pain, palpitations or  syncope.  There is no pedal edema.  He does have some mild dizziness  with standing.   MEDICATIONS:  1. Coumadin as directed.  2. Cardura 8 mg daily.  3. Zetia 10 mg daily.  4. Toprol 50 mg p.o. daily.  5. Welchol.  6. Amiodarone 200 mg daily.  7. Avalide 300/12.5 daily .  8. Lasix 20 mg daily.  9. Amlodipine 5 mg p.o. daily.   PHYSICAL EXAMINATION:  VITAL SIGNS:  Blood pressure 149/68, Pulse is 68,  Weight 177 pounds.  HEENT:  Normal.  NECK:  Supple.  CHEST:  Clear.  CARDIOVASCULAR:  Regular rhythm.  There is no murmur noted.  His  sternotomy is without evidence of infection.  ABDOMEN:  Exam shows no tenderness.  EXTREMITIES:  Show no edema.   DIAGNOSES:  1. Status post mitral valve repair.  John [name] is doing well from a      symptomatic standpoint.  He will continue with SBE prophylaxis.  I      will schedule him to have baseline echocardiogram following his      surgery.  2. History of mild aortic stenosis.  3. 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  4. Hypertension.  His blood pressure is mildly elevated today, but he      is also complaining of mild orthostatic  symptoms.  We will      discontinue his Lasix, but continue his Avalide, and Cardura.  I      will increase his Norvasc to 10 mg p.o. daily.  5. Hyperlipidemia.  He will continue on his present medications.  He      has not tolerated his statins in the past.  6. History of dye allergy.  7. Cerebrovascular disease.  He will need follow-up carotid Dopplers      in October 2009.  8. History of DVT/pulmonary embolus.  He will continue his Coumadin.  9. Postoperative atrial fibrillation.  He remains in sinus rhythm on      examination today.  We will discontinue his amiodarone.   FOLLOWUP:  We will see him back in 6 months.     John [name] John Som, MD, [facility]  Electronically Signed  BSC/MedQ  DD: 10/19/2007  DT: 10/19/2007  Job #: [id number]

## 2010-11-18 NOTE — Assessment & Plan Note (Signed)
OFFICE VISIT   John [name], John [name]  DOB:  19-Oct-1935                                        July 18, 2007  CHART #:  [id number]   HISTORY OF PRESENT ILLNESS:  The patient returns for further followup  related to mitral regurgitation with tentative plans to proceed with  surgery on Tuesday, January 20th. Since he was last seen, he underwent  left and right heart catheterization on January 6th. 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 Right heart pressures were essentially  normal with P.A.  pressures measured 27/8, with a pulmonary capillary  wedge pressure of 7. His cardiac output was normal. He returns for  further followup with tentative plans to proceed with surgery. High  resolution cardiac CT scan has been ordered to more definitively  ascertain the anatomy of his anomalous left main coronary artery. This  has not yet been performed.   I spent in excess of 20 minutes discussing matters with John [name] and  his wife. If the coronary artery does appear to traverse between the  pulmonary artery and the aorta, concomitant coronary artery bypass  grafting would probably be in his best interest. I am suspicious that  this is the case from his recent catheterization, but we will await  followup of his CT scan scheduled for later this week. John [name]  understands and accepts all potential associated risks of surgery and  desires to proceed as described. I think there is a high likelihood that  his valves should be successfully repaired. If valve repair is not  feasible, we will plan to replace his valve using a mechanical  valve as he already is taking Coumadin and will need to remain on  Coumadin for the rest of his life. Overall, I suspect that there is at  least a 95% likelihood of successful valve  repair.   Salvatore Decent. John [name], M.D.  Electronically Signed   CHO/MEDQ  D:  07/18/2007  T:  07/18/2007  Job:  [id number]   cc:   [name]. [name], MD, [facility]  Scott M. Kriste Basque, MD

## 2010-11-18 NOTE — Assessment & Plan Note (Signed)
[facility]                            CARDIOLOGY OFFICE NOTE   John [name], John [name]                         MRN:          [id number]  DATE:06/24/2007                            DOB:          1936/05/31    John [name] is a very pleasant gentleman who has a history of palpitations  felt possibly secondary to SVT, hypertension, hyperlipidemia, history of  pulmonary embolus with DVTs and mitral regurgitation.  When I saw him on  May 10, 2007, we noted that he had a loud mitral regurgitation  murmur.  An echocardiogram in October showed normal LV function and  size.  There was mild aortic stenosis as well as mitral valve prolapse  involving posterior leaflet with 2-3+ mitral regurgitation.  The  patient, when I last saw him, was having more dyspnea on exertion which  was unusual for him.  We therefore scheduled a TEE which was performed  on June 15, 2007.  His LV function was normal.  There appeared to be  mild aortic stenosis upon entering the valve.  There was mild thickening  of the mitral valve with prolapse of the posterior leaflet and there was  moderate to severe mitral regurgitation which is eccentric.  Since that  time, the patient continues to have some dyspnea when walking up stairs  which is unusual for him.  He also notices this when he plays tennis.  There is no orthopnea, PND or pedal edema.  He does not have exertional  chest pain.   MEDICATIONS:  1. Coumadin as directed.  2. Avalide 300/12.5 mg p.o. daily.  3. Cardura 8 mg p.o. daily.  4. Zetia 10 mg p.o. daily.  5. Niaspan 500 mg p.o. daily.  6. Toprol 50 mg p.o. daily.  7. Norvasc 10 mg p.o. daily.  8. Welchol 625 mg p.o. daily.  9. Aspirin 81 mg p.o. daily.   PHYSICAL EXAMINATION:  VITAL SIGNS:  Blood pressure 155/69, pulse 78,  weight 187 pounds.  HEENT:  Normal.  NECK:  Supple.  CHEST:  Clear.  CARDIAC:  Regular rate and rhythm.  There is a 3/6 systolic murmur at  the  apex radiating to the left axis.  ABDOMEN:  No tenderness.  EXTREMITIES:  No edema.   ASSESSMENT/PLAN:  1. Mitral valve prolapse with mitral regurgitation.  John [name]      transesophageal echocardiogram showed moderate to severe mitral      regurgitation with prolapse of the posterior leaflet.  He is also      having progressive dyspnea on exertion which I think is most likely      due to his valvular heart disease.  We have discussed the options      today and I think given that he is having dyspnea that it would be      reasonable to proceed with mitral valve repair.  Note, he also has      aortic stenosis and the valve will need to be evaluated in the      operating room.  At this point, it appears to be  mild and can most      likely be left alone.  We will schedule him for a right and left      heart catheterization prior to his procedure.  I will also schedule      him to see Dr. Cornelius Moras in [facility] for further evaluation.  He will need a      cardiac catheterization prior to the procedure.  He has a dye      allergy and we will premedicate with prednisone, Benadryl and      Pepcid.  He is also on Coumadin for his history of deep venous      thromboses and we will stop that 5 days prior to the procedure and      check his INR the day before the procedure.  We will resume his      Coumadin afterwards.  2. Dye allergy, as per #1.  3. History of palpitations.  He will continue on his Toprol.  4. Cerebrovascular disease.  He will need followup carotid Dopplers in      October 2009.  5. Hypertension.  We will track his blood pressure and increase his      medications as indicated.  6. Hyperlipidemia.  We will continue his present medications.  7. History of deep venous thrombosis/pulmonary embolus.  He will      continue on his Coumadin.  8. We will arrange for him to be seen back in 3 months.  In the      meantime, we will have his catheterization and surgical evaluation       performed.     [name] [name], MD, [facility]  Electronically Signed    BSC/MedQ  DD: 06/24/2007  DT: 06/25/2007  Job #: [id number]   cc:   Salvatore Decent. Cornelius Moras, M.D.

## 2010-11-18 NOTE — Assessment & Plan Note (Signed)
[facility]                               LIPID CLINIC NOTE   ANEUDY, CHAMPLAIN                         MRN:          [id number]  DATE:04/27/2008                            DOB:          July 16, 1935    SUPERVISING PHYSICIAN:  [name]. [name], MD, [facility]   John [name] is seen in the Anticoagulation Clinic for continued followup  monitoring associated with his history of DVT, PE, and atrial  fibrillation.  John [name] is scheduled for arthroscopic knee procedure  with Dr. Priscille Kluver on May 02, 2008.  He presents today for INR  followup and initiation of Lovenox therapy.   The patient's allergies include STATINS and IVP DYE.   His weight is 84 kg.  He took his last dose of Coumadin yesterday on  April 26, 2008.  His INR today was 2.2.  Thus, plan is as follows.  1. The patient will stay off of his Coumadin until instructed by Dr.      Priscille Kluver to restart hopefully on May 02, 2008, in the evening.  2. Based on the patient's weight, have authorized 120 mg equalling 1.5      mg/kg subcutaneously to be given each day starting tomorrow,      April 28, 2008.  The patient will take one injection on April 28, 2008, one injection on April 29, 2008, and one injection on      April 30, 2008.  He will take nothing on May 01, 2008, to      allow washout and then he will present for surgery on May 02, 2008.  I have sent this prescription by [facility] to [facility]      [facility] per his request.  He has a deductible, so I have only      sent 3 syringes per his request, will call in additional for      afterwards depending on Dr. Sable Feil assessment of our ability to      restart after that time.  For safety as we are heading into a      weekend, I have given the patient 4 Lovenox 120 mg syringes that he      will replace if he does not need them over the weekend in case the      pharmacy does not have this supply, he understands that he needs  to      call me on tomorrow as he gets first injection with questions as he      has my pager number and I will be happy to talk him through it.  He      will monitor for injection reactions and call with problems to the      answering service.  Time spent with the      patient today is 35 minutes.  We will restart after procedure based      on Dr. Sable Feil recommendations.     Shelby Dubin, PharmD, BCPS, CPP  Electronically Signed  [name]. [name], MD, [facility]  Electronically Signed   MP/MedQ  DD: 04/27/2008  DT: 04/28/2008  Job #: [id number]   cc:   [name]. [name], MD, [facility]

## 2010-11-18 NOTE — H&P (Signed)
John [name], John NO.:  [id number]   MEDICAL RECORD NO.:  [id number]          PATIENT TYPE:  EMS   LOCATION:  [facility]                         FACILITY:  [facility]   PHYSICIAN:  John [name]. John Emms, MD, FACCDATE OF BIRTH:  May 20, 1936   DATE OF ADMISSION:  06/25/2008  DATE OF DISCHARGE:                              HISTORY & PHYSICAL   PRIMARY CARDIOLOGIST:  John [name]. John Som, MD, [facility]   PRIMARY CARE PHYSICIAN:  John [name]. John Basque, MD   REASON FOR EVALUATION:  Chest pain.   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  He had a cardiac catheterization completed in January 2009, with  subsequent coronary artery bypass grafting with LIMA to LAD in January  2009, along with SVG to first and second marginal of the circumflex.  He  also had mitral valve stenosis and he had a mitral valve repair during  that surgery.  The patient has been doing well from a cardiac standpoint  and did followup with Dr. Olga [name] in October 2009, for a preop  evaluation for left knee surgery.  The patient was doing well and Dr.  Jens [name] cleared him for surgery and he did undergone the surgery in  October 2009.   Since surgery, the patient has been feeling some soreness in his chest  which reminds him of the postoperative soreness he had post-CABG.  However, over the last couple of months, he has noticed some pressure in  his chest that occurs with exertion.  He also has some pain radiating to  his neck and shoulders.  He has also noted that he is having less  strength in his lower extremities.  He has been going to rehab for his  knee and at one point about 2 weeks before Thanksgiving, his mouth  became very dry and he had a presyncopal episode.  He sat down, was  given some fluids and felt better.  The patient is also complaining of  no energy progressively and more increased pressure  with exertion.  As a  result of this, he mentioned to his wife who insisted that he call  [facility] which he did today and they advised him to come to  the emergency room.   REVIEW OF SYSTEMS:  Chest pressure and soreness, shortness of breath,  dyspnea on exertion, presyncopal episode about 2 weeks ago.  Otherwise,  negative.   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  The patient has a  history of hypertension, hyperlipidemia, and history of DVT and PE on  chronic Coumadin therapy.  He also had some postoperative atrial fib but  now remains in normal sinus rhythm.   PAST SURGICAL HISTORY:  Mitral valve repair and coronary artery bypass  grafting  in January 2009.  Left knee surgery in October 2009.   SOCIAL HISTORY:  He lives in [place] with his wife.  He is a retired  Tax adviser for [employer].  He quit smoking 40 years ago.  Does not drink.  He  goes to rehab for his knee.   FAMILY HISTORY:  Mother deceased with a history of hypertension and CVA.  Father deceased with heart problems.  He has 2 brothers with  hypertension.   CURRENT MEDICATIONS:  1. Coumadin 2.5 mg daily, except for Tuesdays, Thursdays at 1.25 mg.  2. Cardura 80 mg daily.  3. Zetia 10 mg daily.  4. Toprol 50 mg daily.  5. Avalide 300/12.5 mg daily.  6. Doxazosin 4 mg daily.  7. WelChol 625 mg daily.  8. Amlodipine 10 mg daily.   ALLERGIES:  Contrast dye and statins.   CURRENT LABS:  Sodium 140, potassium 4.2, chloride 110, CO2 22, BUN 26,  creatinine 1.2, glucose 153, hemoglobin 13.9, hematocrit 41.0.  point of  care markers less than 0.05.  D-dimer and sed rate are pending.  VQ lung  scan is pending.   Chest x-ray revealing postcoronary artery bypass grafting staple with no  active  cardiopulmonary disease.  EKG revealing normal sinus rhythm with  rate of 84 beats per minute.   PHYSICAL EXAMINATION:  VITAL SIGNS:  Blood pressure 139/68, pulse 81,  respirations 18, temperature 97.3, and O2 sat 98% on room air.  HEENT:  Head is normocephalic and atraumatic.  Eyes, PERRLA.  Mucous  membranes and mouth, pink and moist.  Tongue is midline.  NECK:  Supple.  There is no JVD.  No carotid bruits appreciated.  CARDIOVASCULAR:  Regular rate and rhythm without murmur, rubs, or  gallops.  Pulses are 2+ and equal without bruits.  LUNGS:  Clear to auscultation.  ABDOMEN:  Soft, nontender.  2+ bowel sounds.  EXTREMITIES:  Without clubbing, cyanosis, or edema.  NEURO:  Cranial nerves II through XII are grossly intact.   IMPRESSION:  1. Chest pain with atypical and typical features.  Rule out angina.  2. Status post left knee surgery in October 2009.  3. 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  4. Hypertension.  5. Hyperlipidemia.   PLAN:  This is a 75 year old Caucasian male, patient of Dr. 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  The  patient did have a CT scan with the calcium score of 80 prior to the  bypass grafting.  The patient has been ordered a V/Q lung scan secondary  to the chest pressure.  We will also check sed rate and a D-dimer.  The  patient may needed to have a stress Myoview completed.  He did have an  echocardiogram with  an EF that was normal postoperatively.  We will repeat that to evaluate  the mitral valve and  also to check for EF at this time.  The patient  will be kept overnight for further observation with further  recommendations by Dr. Jens [name] depending upon  hospital course.  The  patient will continue on his Coumadin and we will follow.      Bettey Mare. Lyman Bishop, NP      John [name]. John Emms, MD, [facility]  Electronically Signed    KML/MEDQ  D:  06/25/2008  T:  06/26/2008  Job:  [id number]   cc:   John [name]. John Basque, MD

## 2010-11-18 NOTE — Assessment & Plan Note (Signed)
[facility]                            CARDIOLOGY OFFICE NOTE   HERMENEGILDO, CLAUSEN                         MRN:          [id number]  DATE:05/10/2007                            DOB:          August 13, 1935    [name] is a very pleasant gentleman, who has a history of  palpitations, felt secondary to SVT, hypertension, hyperlipidemia,  history of pulmonary embolus and DVTs, who returns for followup today.  When I saw him on March 16, 2007, we noted that he had a louder  mitral regurgitation murmur.  We scheduled him to have an echocardiogram  in followup.  This was performed on April 06, 2007.  His LV function  was normal, as was his LV size.  His end systolic dimension was 31.7 mm.  He was found to have very mild aortic stenosis.  There was mitral valve  prolapse, involving the posterior leaflet, with 2 to 3+ mitral  regurgitation, although difficult to quantitate.  The left atrium was  mildly dilated.  We asked that he return today for further discussion.   Note:  He has not had chest pain, although he does have some dyspnea on  exertion.  This is with more extreme activities.  There is no orthopnea,  PND, pedal edema, palpitations, presyncope, syncope or chest pain.   MEDICATIONS INCLUDE:  1. Coumadin.  2. Avalide 300/12.5 mg p.o. daily.  3. Cardura 8 mg p.o. daily.  4. Zetia 10 mg p.o. daily.  5. Niaspan 500 mg p.o. daily.  6. Toprol 50 mg p.o. daily.  7. Norvasc 10 mg p.o. daily.  8. Welchol 625 mg p.o. daily.   PHYSICAL EXAM:  Shows a blood pressure of 151/69 and his pulse is 53.  He weighs 187 pounds.  HEENT:  Normal.  NECK:  Supple.  CHEST:  Clear.  CARDIOVASCULAR EXAM:  Reveals a regular rate and rhythm.  There is a 3/6  systolic murmur at the apex that radiates to the left axis.  ABDOMINAL EXAM:  Shows no tenderness.  EXTREMITIES:  Show no edema.   DIAGNOSES:  1. Mitral valve prolapse with mitral regurgitation - Mr. Mudrick is    having mild dyspnea with more extreme activities, but not with      routine activities.  We will plan to proceed with a transesophageal      echocardiogram to further evaluate his mitral valve and quantitate      his mitral regurgitation.  He would prefer to do this in January.      If it is indeed severe, then we will need to decide whether to      consider early repair, versus followup echocardiograms for size and      LV function.  He does seem to understand this.  2. History of palpitations - he will continue on his Toprol.  3. Cerebrovascular disease - we did perform carotid Dopplers on      April 06, 2007.  He was found to have 40-59% bilateral internal      carotid artery stenosis.  We will continue his  aspirin and he will      need followup in one year.  4. Hypertension - his blood pressure is adequately controlled on his      present medications.  5. Hyperlipidemia - he has not tolerated Zocor or Lipitor in the past.      I will try Pravachol.  If he tolerates this without myalgias, then      we will check lipids and liver in six weeks.  6. History of DVT/PE - he will continue on Coumadin.   We will see him back in three months after his TEE.     [name] [name], MD, [facility]  Electronically Signed    BSC/MedQ  DD: 05/10/2007  DT: 05/11/2007  Job #: [id number]

## 2010-11-18 NOTE — Assessment & Plan Note (Signed)
[facility]                            CARDIOLOGY OFFICE NOTE   ILYAAS, MUSTO                         MRN:          [id number]  DATE:04/05/2008                            DOB:          Feb 20, 1936    HISTORY OF PRESENT ILLNESS:  [name] is a pleasant 75 year old  gentleman who is status post recent mitral valve repair as well as  coronary artery bypassing graft in January 2009.  Note, he did not have  significant coronary artery disease.  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  He therefore had a LIMA to the LAD and the saphenous vein graft  to the first and second circumflex marginals.  He also has mild aortic  stenosis.  I did perform an echocardiogram as a baseline study following  this procedure on October 27, 2007.  His LV function was normal.  There is  trivial mitral regurgitation.  He also has cerebrovascular disease that  is moderate and is due for a followup this month.  Since I last saw him  he has not had any chest pain, shortness of breath, palpitations, or  syncope.  There is no pedal edema.  He has torn meniscus in his left  knee and will require surgery.  We were asked to see him to manage his  Coumadin during that period as well.  He is also occasionally having low  back pain that increases with certain movements.   MEDICATIONS:  1. Coumadin as directed.  2. Cardura 8 mg p.o. daily.  3. Zetia 10 mg p.o. daily.  4. Toprol 50 mg p.o. daily.  5. Avalide 300/12.5 mg p.o. daily.  6. Doxazosin 4 mg p.o. daily.  7. WelChol 625 mg p.o. daily.  8. Amlodipine 10 mg p.o. daily.   PHYSICAL EXAMINATION:  VITAL SIGNS:  Today shows a blood pressure  134/67, his pulse is 68.  He weighs 182 pounds.  HEENT:  Normal.  NECK:  Supple.  CHEST:  Clear.  CARDIOVASCULAR:  Regular rate.  There is a 1/6 systolic murmur at the  upper left sternal border.  There is no MR murmur.  ABDOMEN:  Benign.  EXTREMITIES:  Show no edema.   His electrocardiogram shows a sinus rhythm at a rate of 68.  There are  no ST changes noted.   DIAGNOSES:  1. Status post mitral valve repair - The patient is doing well from      symptomatic standpoint.  He will continue with subacute bacterial      endocarditis prophylaxis.  2. History of mild aortic stenosis - His recent echocardiogram did not      show any significant gradient.  3. 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  4. Hypertension - His blood pressure is adequately controlled.  I will      check a BMET, follow his potassium and renal function.  5. Hyperlipidemia -  He will continue on his Zetia and his WelChol.  He      has not tolerated statins in the past.  We will check lipids and      liver and adjust as indicated.  6. History of dye allergy.  7. Cerebrovascular disease - He is due for followup carotid Dopplers.  8. Upcoming surgery on the left knee - He is not having any      cardiovascular symptoms.  I think he can proceed with this safely.      We will discontinue his Coumadin 4 days prior to that and bridge      with Lovenox, given his history of pulmonary embolus.  9. History of deep venous thrombosis/pulmonary embolus - He is on      Coumadin and he will be followed in our Coumadin Clinic.  10.History of postoperative atrial fibrillation - He is maintaining      sinus rhythm.   We will see him back in 9 months.     [name] [name], MD, [facility]  Electronically Signed    BSC/MedQ  DD: 04/05/2008  DT: 04/06/2008  Job #: [id number]

## 2010-11-18 NOTE — Discharge Summary (Signed)
NAMEJAISEN, WILTROUT NO.:  [id number]   MEDICAL RECORD NO.:  [id number]          PATIENT TYPE:  INP   LOCATION:  2007                         FACILITY:  [facility]   PHYSICIAN:  Salvatore Decent. Cornelius Moras, M.D. 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   IN-HOSPITAL DIAGNOSES:  1. Postoperative thrombocytopenia.  2. Postoperative volume overload.  3. Postoperative atrial fibrillation.  4. Acute blood loss anemia postoperatively.   SECONDARY DIAGNOSES:  1. Trivial aortic stenosis.  2. Hypertension.  3. Hyperlipidemia.  4. History of pulmonary emboli.  5. History of deep venous thrombosis.  6. Prostate cancer status post vasectomy.  7. Status post right shoulder surgery x1.  8. Status post left shoulder surgery x2.   IN-HOSPITAL OPERATIONS AND PROCEDURES:  1. Mitral valve repair with a quadrangular resection of the posterior      leaflet with a 26-mm [employer] Memo 3-D ring annuloplasty.  2. Coronary artery bypass grafting x2 using left internal mammary      artery to the left anterior descending coronary artery, saphenous      vein graft to first circumflex marginal branch.  Endoscopic      saphenous vein harvest from the right thigh was done.   HISTORY OF PRESENT ILLNESS AND HOSPITAL COURSE:  The patient is a 75-  year-old male with history of mitral valve prolapse with mitral  regurgitation who has been followed by Dr. [name].  The patient now  presents with progressive symptoms of exertional shortness of breath.  A  transesophageal echocardiogram confirmed the presence of prolapse  involving the middle scallop of the posterior leaflets, moderate-to-  severe mitral regurgitation.  There is normal left ventricular size and  function.  Cardiac catheterization was done demonstrating the  anomalous  left main coronary artery arising from the right sinus of Valsalva which  traverses posterior to the pulmonary artery anterior to the aorta.  Left  ventricular function was normal.  The patient was seen and evaluated by  Dr. Cornelius Moras.  Dr. Cornelius Moras discussed with the patient undergoing mitral valve  repair as well as coronary artery bypass grafting.  He discussed the  risks and benefits with the patient.  The patient acknowledged  understanding and agreed to proceed.  Surgery was scheduled for July 26, 2007.   For details of the patient's past medical history and physical exam,  please see dictated H&P.   The patient was taken to the operating room July 26, 2007 where he  underwent mitral valve repair with a quadrangular resection posterior  leaflet with 26-mm CarboMed[employer] Memo 3-D ring annuloplasty.  He also had  coronary bypass grafting x2 using a left internal mammary artery to left  anterior descending coronary artery, saphenous vein graft to first  circumflex marginal branch.  Endoscopic saphenous vein harvest from  right thigh was done.  The patient tolerated this procedure well and was  transferred to the intensive  care unit in stable condition.   Postoperatively, the patient was noted to have a low blood count with a  hemoglobin of 7.4.  He received a transfusion and monitored.  He had  minimum drainage from chest tubes at this point.  The patient was able  to be weaned and extubated the evening of surgery.  Post-extubation, he  was noted to be alert and oriented x4, neurologically intact.  The  patient was placed on nasal cannula.  He underwent a postoperative chest  x-ray day one which showed to be stable.  He had minimal drainage from  chest tubes, and chest tubes were discontinued in normal fashion.  He  was encouraged to use his incentive spirometer.  The patient was able to  be weaned off oxygen, sating greater than 98% on room air prior to  discharge home.   Postoperatively on monitor, the patient was noted to be  back and forth between junctional rhythm and sinus brady.  Unfortunately, atrial wires were not working properly, and the patient  was placed on VVI backup.  His Toprol-XL was placed on hold.  Heart rate  and blood pressure monitored.  By postop day #2, blood pressure  stabilized.  He was restarted on low-dose Toprol-XL.  He was noted to be  in normal sinus rhythm at that time , and bradycardia resolved.  Unfortunately, on postop day #3 the patient went into rapid atrial  fibrillation with heart rate in the 130s to 150s.  He was started on IV  amiodarone. After starting IV amiodarone and increasing his Toprol-XL  dose, the patient converted back to normal sinus rhythm.  IV amiodarone  was discontinued, and the patient was placed on p.o. amiodarone.  Currently, the patient is in normal sinus rhythm.  We will plan to  continue to monitor the patient's heart rate.  Blood pressure was stable  the remainder of his hospital course.  As stated above, the patient  received transfusion immediately postoperatively.  Hemoglobin/hematocrit  were followed and stabilized.  He did not require any further  transfusions and was asymptomatic.  Last hemoglobin/hematocrit on postop  day #3 was 9.0/25.8%.  The patient did have slight thrombocytopenia  postoperatively, with platelet count dropping to 84.  Aspirin was placed  on hold.  Platelet count was followed.  He had slightly improved by  postop day 3 to 91.  Aspirin still on hold.  Postoperatively, the  patient did have slight volume overload.  He was started on diuretics.  Daily weights were followed, and the patient was back to near baseline  weight.  Currently he is 1.70 kg above his preoperative weight.  The  patient was restarted on his Coumadin on postop day #2.  He is on  chronic Coumadin for history of DVT and PE.  The patient is also status  post mitral valve repair.  Daily PT/INR obtained,  and Coumadin was  adjusted appropriately.  Plan the patient to be nearing therapeutic  prior to discharge home.  Postoperatively, the patient was ambulating  well with cardiac rehab.  He was progressing well and ambulating prior  to discharge.  He was tolerating a regular diet well.  No nausea or  vomiting noted.   On postop day #3, all vital signs were stable.  The patient was  afebrile.  He was sating greater than 98% on room air.  Labs showed CBC  with white count 10.3, hemoglobin of 9.0, hematocrit 25.8, platelet  count 91.  Sodium of  138, potassium 4.0, chloride of 104, bicarb of 30,  BUN 23, creatinine 0.87, glucose of 114.  The patient was noted to be in  normal sinus rhythm in the afternoon of postop day #3.  Pulmonary status  was stable.  All incisions were clean, dry, and intact and healing well.   The patient is deemed to be ready for discharge home on postop day #5 if  he remains in stable condition.  Heart rate remains normal sinus rhythm.   FOLLOWUP APPOINTMENTS:  Followup appointment has been arranged with Dr.  Cornelius Moras for August 15, 2007 at 1:30 p.m.  The patient will need to obtain  PA and lateral chest x-ray 30 minutes prior to this appointment.  He  will need to follow up with Dr. [name] in 2 weeks.  He will need to  contact Dr. Ludwig Clarks office to make these arrangements.   ACTIVITY:  Patient instructed no driving until released to do so, no  lifting over 10 pounds.  He is told to ambulate 3-4 times per day,  progress as tolerated and continue his breathing exercises.   WOUND CARE:  The patient was told to shower, washing his incisions using  soap and water.  He is contact the office if he develops any drainage or  opening from his incision sites.   DIET:  The patient was educated on diet to be low-fat, low-salt.   DISCHARGE MEDICATIONS:  1. Toprol XL 50 mg daily.  2. Avapro 300 mg daily.  3. Amiodarone 400 mg b.i.d. x14 days and 200 mg b.i.d.  4. Coumadin  will be dosed based on the patient's discharge PT/INR      level.  Plan Dr. [name] to follow.  5. Lasix 40 mg daily x5 days.  6. Potassium chloride 20 mEq daily x5 days.  7. Niacin 500 mg at night.  8. Cardura 8 mg at night.  9. Welchol 625 mg at night.  10.Zetia 10 mg at night.  11.Oxycodone 5 mg 1-2 tablets q.4-6h. p.r.n.      Theda Belfast, PA      Salvatore Decent. Cornelius Moras, M.D.  Electronically Signed    KMD/MEDQ  D:  07/29/2007  T:  07/29/2007  Job:  [id number]   cc:   [name]. [name], MD, [facility]  Salvatore Decent. Cornelius Moras, M.D.

## 2010-11-18 NOTE — Assessment & Plan Note (Signed)
OFFICE VISIT   JAKIM, DRAPEAU  DOB:  1935/11/30                                        August 22, 2007  CHART #:  [id number]   HPI:  [name] is status post mitral valve repair and coronary artery  bypass grafting x2 done July 26, 2007.  He was seen in the office  August 15, 2007, for his postoperative followup visit.  At this time a  chest x-ray was obtained and showed a probable large left pleural  effusion.  Patient was sent down to X-Ray to obtain a decubitus left  lateral film.  This showed a large left effusion.  It was felt that this  would need to be drained.  Unfortunately, patient is on Coumadin from  where he had postoperative atrial fibrillation but had converted back to  normal sinus rhythm.  A PT, INR was drawn at this time and INR was  elevated at 3.  Due to the elevated INR, it was felt to be safe, have  patient hold Coumadin and plan followup on Monday, today, August 22, 2007, for repeat chest x-ray and possible left pleural effusion.  Patient presents today, August 22, 2007, without complaints.  He  states he is feeling better.  He does still  have some sternal  incisional discomfort but ambulating well, can walk about a mile without  dyspnea.  He denies any drainage or opening from any of his incision  sites.  He states he is now able to sleep in bed laying flat.   PHYSICAL EXAM:  VITAL SIGNS:  Blood pressure is 171/77, pulse 75,  respirations of 18, O2 sats 96% on room air.  RESPIRATORY:  The patient has diminished/absent breath sounds left lower  lobe.  CARDIAC:  Regular rate and rhythm.  No murmurs, gallops or rubs noted.  ABDOMEN:  Soft, nontender.  Positive bowel sounds.  EXTREMITIES:  No edema noted bilateral lower extremities.  All  extremities are warm and well perfused.  INCISIONS:  All incisions clean, dry and intact and healing well.   LAB STUDIES:  The patient had a PA & lateral chest x-ray done today,  August 22, 2007, showing a large left pleural effusion filling up  about 50% of the lung field.   PROCEDURES:  Dr. Cornelius Moras discussed with the patient undergoing left  thoracentesis.  The risks and benefits discussed with patient.  Informed  signed consent was obtained.  Left thoracentesis done and drained about  1600 mL of bloody serosanguineous fluid.  Patient tolerated this  procedure well without complications.  He was stable post procedure.   IMPRESSION AND PLAN:  Patient is status post mitral valve repair and  coronary artery bypass grafting.  He is now status post left  thoracentesis.  Will have patient obtain a PA and lateral chest x-ray  now for evaluation.  Will plan to follow up with patient in 6 weeks with  chest x-ray.  Patient instructed to continue his Coumadin today at prior  dose.  He has an appointment with Dr. [name] on Tuesday, the 24th of  February.  At this time patient will need to have a PT, INR blood draw  done.  Patient also instructed to decrease his amiodarone dose to 200  milligrams by mouth daily.  He is to continue all other medications  as  prescribed.  Patient acknowledged understanding.   Salvatore Decent. Cornelius Moras, M.D.  Electronically Signed   KMD/MEDQ  D:  08/22/2007  T:  08/23/2007  Job:  [id number]   cc:   Salvatore Decent. Cornelius Moras, M.D.  [name] [name], MD, [facility]

## 2010-11-18 NOTE — Assessment & Plan Note (Signed)
OFFICE VISIT   John [name], John [name]  DOB:  1935-07-31                                        October 10, 2007  CHART #:  [id number]   HISTORY OF PRESENT ILLNESS:  The patient returns for further followup  status post mitral valve repair and coronary artery bypass grafting x2  on July 26, 2007.  He was last seen here in the office August 22, 2007.  Since then, he has continued to do quite well.  He has had some  problems with blood pressure control, at times with his blood pressure  too high, and other times too low.  All of this has been related to and  adjusted through changes in his antihypertensive medications.  He has  completed the cardiac rehab program, and other than episodes where he  missed due to problems with blood pressure he has gotten through this  quite well.  He reports no exercise difficulties whatsoever and he  denies any shortness of breath either with activity or at rest.  He  still has some mild residual discomfort across his anterior chest that  he describes as a tightness.  This seems to be positional in nature  and not related to physical activity per se.  This is not associated  with shortness of breath.  He notices it most if he is sitting for  prolonged periods of time or straining.  It has not bothered him at all  when he is walking at the cardiac rehab program and it does not bother  him when he is lying supine in bed at night.  He denies any fevers,  chills, or productive cough and the remainder of his review of systems  is unremarkable.  The remainder of his past medical history is  unchanged.   PHYSICAL EXAM:  Notable for a well-appearing male with blood pressure  128/69.  Pulse 69.  Oxygen saturation 98% on room air.  Examination of  the chest reveals the median sternotomy incision that has healed nicely.  The sternum is stable on palpation and nontender.  Auscultation of the  chest demonstrates clear breath sounds which are  symmetrical  bilaterally.  CARDIOVASCULAR:  Exam includes regular rate and rhythm.  No murmurs,  rubs, or gallops are noted.  ABDOMEN:  Soft and nontender.  EXTREMITIES:  Warm and well perfused.  There is no lower extremity  edema.   DIAGNOSTIC TESTS:  Chest x-ray obtained today at the [facility] is reviewed.  This demonstrates no residual pleural effusion.  There is no significant residual atelectasis.  The lung fields are  clear.  No other abnormalities are noted.   IMPRESSION:  Satisfactory progress now almost 3 months following mitral  valve repair and coronary artery bypass grafting.  The patient seems to  be getting along quite well.  He does remain on amiodarone at this time.  He has been on Coumadin for years due to a remote history of deep vein  thrombosis and pulmonary embolus.   PLAN:  I have suggested to the patient that he could go ahead and cut  his amiodarone back to 100 mg daily, and perhaps stop it completely when  he sees Dr. [name] in the office next week.  I see no reason for him  to remain on amiodarone any further.  I also feel that he can start  increasing his physical activity as tolerated.  This could include more  strenuous physical activity, lifting, and even tennis.  I have cautioned  him to gradually increase his activity as tolerated, and not to try to  do too much all at once.  All of his questions have been addressed.  At  some point, a followup echocardiogram might be useful to evaluate the  appearance of his mitral valve repair although he seems to be doing  quite well and he has no murmur on physical exam.  In the future, the  patient will call and return to see [place] here at [facility] only should further problems or difficulties arise.   Salvatore Decent. Cornelius Moras, M.D.  Electronically Signed   CHO/MEDQ  D:  10/10/2007  T:  10/10/2007  Job:  [id number]   cc:   [name]. [name], MD, [facility]  Scott M. Kriste Basque, MD

## 2010-11-18 NOTE — Assessment & Plan Note (Signed)
OFFICE VISIT   KEYONTAE, HUCKEBY  DOB:  1936-05-28                                        August 15, 2007  CHART #:  [id number]   HISTORY OF PRESENT ILLNESS:  Mr. Galik returns for routine follow-up  status post mitral valve repair and coronary artery bypass grafting x2  on 07/26/07.  Mr. Mullane postoperative recovery in the hospital was  notable only for some transient episodes of paroxysmal atrial  fibrillation for which he was treated with amiodarone.  He converted to  sinus rhythm prior to hospital discharge.  Following hospital discharge  he has continued to improve.  His prothrombin time has been followed  carefully through the Coumadin clinic, although he reports that last  week his INR was elevated greater than 4.  His dose was adjusted back  and he is to have it rechecked later this week.  He has no complaints.  He has had minimal soreness in his chest and he is no longer taking any  pain medications other than Tylenol.  He reports no shortness of breath  and in fact he states that his breathing is better than it was prior to  surgery.  He denies any fevers or chills.  He is concerned at his pulse  rate that it has been somewhat elevated in comparison with his  preoperative pulse.  However, his pulse has never been greater than 95  and he denies any episodes of rapid heart rate or tachy palpitations.  The remainder of his review of systems is unrevealing.   PHYSICAL EXAMINATION:  VITALS: Notable for a well appearing male with  blood pressure 98/66, pulse is 92 and regular.  Oxygen saturation is 98%  on room air.  Examination of the chest reveals clear breath sounds.  There are diminished breath sounds at the left lung base.  No wheezes or  rhonchi are noted.  CARDIOVASCULAR:  Includes regular rate and rhythm.  No murmurs, rubs or  gallops are noted.  ABDOMEN:  Soft, nontender.  EXTREMITIES:  Warm and well perfused.  There is no lower extremity  edema.  The small incision from endoscopic vein harvest from the right  thigh has healed nicely.  The remainder of his physical exam is  unrevealing.   DIAGNOSTIC TEST:  Chest x-ray obtained today at the [facility] imaging  center is reviewed.  This demonstrates what appears to be a fairly large  left pleural effusion with associated left lower lobe atelectasis. The  lung fields are otherwise clear.  There is trivial pleural fluid on the  right side. All the sternal wires appear intact.  No other abnormalities  are noted.   IMPRESSION:  Probable large left pleural effusion which has remained  remarkably asymptomatic.  Overall Mr. Cuff appears to be doing well and  he has no specific complaints.   PLAN:  We will check a decubitus x-ray film today to make sure that this  is in fact a large effusion which is free flowing.  We will also check a  prothrombin time today. If his prothrombin time is not elevated, we will  bring him back for thoracentesis tomorrow.  If the prothrombin time is  too elevated, we will postpone thoracentesis and allow his prothrombin  time to drift down on lower dose of Coumadin.   Salvatore Decent. Cornelius Moras,  M.D.  Electronically Signed   CHO/MEDQ  D:  08/15/2007  T:  08/16/2007  Job:  [id number]   cc:   [name]. [name], MD, [facility]  Scott M. Kriste Basque, MD

## 2010-11-18 NOTE — Assessment & Plan Note (Signed)
[facility]                            CARDIOLOGY OFFICE NOTE   WITTEN, CERTAIN                         MRN:          [id number]  DATE:03/16/2007                            DOB:          December 22, 1935    [name] is a pleasant gentleman who has a history of palpitations felt  possibly secondary to SVT, though undocumented.  He also has  hypertension, hyperlipidemia, and a history of pulmonary embolus and  DVTs.  Since I last saw him he has minimal dyspnea on exertion but has  no orthopnea, PND, pedal edema, palpitations, presyncope, syncope, or  chest pain.   His medications at present include:  1. Coumadin as directed.  2. Avalide 300/12.5 mg p.o. daily.  3. Cardura 8 mg p.o. daily.  4. Zetia 10 mg p.o. daily.  5. Niaspan 500 mg p.o. daily.  6. Aspirin 81 mg p.o. daily.  7. Toprol 50 mg p.o. daily.  8. Norvasc 10 mg p.o. daily.   Physical exam today shows a blood pressure of 151/68 and his pulse is  62.  He weighs 183 pounds.  HEENT:  Normal.  NECK:  Supple.  There is a left carotid bruit noted.  CHEST:  Clear to auscultation with normal expansion.  CARDIOVASCULAR:  A regular rate and rhythm.  There is a 2-3/6 systolic  murmur at the apex that radiates to the left axilla.  ABDOMEN:  No pulsatile masses, no bruits, and is soft.  EXTREMITIES:  No edema.   An electrocardiogram today shows a sinus rhythm at a rate of 54.  There  are no ST changes noted.   DIAGNOSES:  1. History of palpitations.  This is felt possibly secondary to      supraventricular tachycardia in the past although undocumented.  He      has not had any problems with this recently and we will continue      with his Toprol at 50 mg p.o. daily.  2. Murmur.  He has a loud mitral regurgitation murmur.  I will plan to      repeat an echocardiogram.  3. Carotid bruit.  Will check carotid Dopplers.  4. Hypertension.  He will continue on his present blood pressure  medicines.  We will check a BMET to follow his potassium and renal      function.  5. Hyperlipidemia.  Will check lipids and liver and adjust as      indicated.  Note, he has been intolerant of statins in the past.  6. History of pulmonary embolus and deep vein thromboses.  He will      continue on his Coumadin and we will check a CBC.  This is being      followed in our Coumadin clinic.   He will continue with risk factor modification and we will see him back  in 12 months.     [name] [name], MD, [facility]  Electronically Signed    BSC/MedQ  DD: 03/16/2007  DT: 03/16/2007  Job #: [id number]

## 2010-11-18 NOTE — Op Note (Signed)
NAMEIREOLUWA, GRANT NO.:  [id number]   MEDICAL RECORD NO.:  [id number]          PATIENT TYPE:  OUT   LOCATION:  CATS                         FACILITY:  [facility]   PHYSICIAN:  Salvatore Decent. Cornelius Moras, M.D. 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   PROCEDURES:  1. Median sternotomy for mitral valve repair (quadrangular resection      of the posterior leaflet with 26-mm Carbomedics memo 3D ring      annuloplasty).  2. Coronary artery bypass grafting x2 (left internal mammary artery to      distal left anterior descending coronary artery, saphenous vein      graft to first circumflex marginal branch, endoscopic saphenous      vein harvest from right thigh).   SURGEON:  Dr. Purcell Nails.   ASSISTANT:  Mr. Gershon Crane.   ANESTHESIA:  General.   CLINICAL NOTE:  The patient is a 75 year old male with history of mitral  valve prolapse with mitral regurgitation, who has been followed by Dr.  [name].  The patient now presents with progressive symptoms of  exertional shortness of breath.  A transesophageal echocardiogram  confirms the presence of prolapse involving the middle scallop of the  posterior leaf with moderate-to-severe severe mitral regurgitation.  There is normal left ventricular size and function.  Cardiac  catheterization demonstrates the anomalous left main coronary artery  arising from the right sinus of Valsalva which traverses posterior to  the pulmonary artery and anterior to the aorta; the left ventricular  function is normal.  A full consultation has been dictated previously.  The patient and his family have been counseled at length regarding the  indications,  risks, and potential benefits of surgery.  Alternative  treatment strategies have been discussed.  They understand and accept  all associated risks and desire to proceed as described.   OPERATIVE FINDINGS:  1. 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  2. Normal left ventricular size and function  3. Good-quality left internal mammary artery and saphenous vein      conduit for grafting.  4. Good-quality target vessels for grafting.  5. No residual mitral regurgitation after successful mitral valve      repair   OPERATIVE NOTE IN DETAIL:  The patient was brought to the operating room  on above-mentioned date.  Central monitoring was established by the  anesthesia service under the care and direction of Dr. Hart Robinsons.  Specifically, a Swan-Ganz catheter was placed through the  right internal jugular approach.  A radial arterial line was placed.  Intravenous antibiotics were administered.  Following induction with  general endotracheal anesthesia, a Foley catheter was placed.  The  patient's chest, abdomen, both groins, and  both lower extremities were  prepared and draped in sterile manner.  Baseline transesophageal  echocardiogram was performed by Dr. Gelene Mink.  This confirms the  presence of mitral regurgitation as described previously.  There is  normal left ventricular size and function.  The aortic valve appears  essentially normal with normal mobility of all three leaflets and no  significant aortic stenosis.   A median sternotomy incision is performed and the left internal mammary  artery was dissected from the chest wall and prepared for bypass  grafting.  The left internal mammary artery is good-quality conduit.  Simultaneously saphenous vein was obtained the patient's right thigh  using endoscopic vein harvest technique.  The saphenous vein was a good-  quality conduit.   After the saphenous vein had been removed, the small  incision in the right thigh was closed in multiple layers with running  absorbable suture.  The patient was heparinized systemically and left  internal mammary artery transected distally.  It was noted to have  excellent flow.   The pericardium was opened.  The ascending aorta was normal in  appearance.  The ascending aorta was cannulated for cardiopulmonary  bypass.  A venous cannula was placed directly in the superior vena cava.  A second venous cannula was placed low in the right atrium with the tip  extending down the inferior vena cava.  A retrograde cardioplegic  catheter was placed through the right atrium into the coronary sinus.  Adequate heparinization was verified.  Cardiopulmonary bypass was begun  and the surface of the heart was inspected.  Distal target vessels were  selected for coronary bypass grafting.  A temperature probe was placed  in the left ventricular septum and a cardioplegic catheter was placed in  the ascending aorta.   The patient was allowed to cool passively to 32 degrees systemic  temperature.  The aortic crossclamp was applied and cold blood  cardioplegia was administered initially in antegrade fashion through the  aortic root.  Iced saline slush was applied for topical hypothermia and  supplemental.  Cardioplegia was administered retrograde through the  coronary sinus catheter.  The initial cardioplegic arrest and myocardial  cooling were felt to be excellent.  Repeat doses of cardioplegia were  administered intermittently throughout the crossclamp portion of the  operation through the aortic root, down the subsequently placed vein  graft, and retrograde through the coronary sinus catheter to maintain  left ventricular septal temperature below 15 degrees centigrade.   The following distal coronary anastomoses were performed:  1. The first circumflex marginal branch was grafted with a saphenous       vein graft in an end-to-side fashion.  This vessel measured 1.4 mm      in diameter and is a good-quality target vessel for grafting.  2. The distal left anterior descending coronary artery is grafted with      left internal mammary artery in end-to-side fashion.  This vessel      measured 1.5 mm in diameter and is a good-quality target vessel for      grafting.   The left atriotomy incision was performed posteriorly through the  interatrial groove.  The mitral valve was exposed using a self-retaining  retractor.  Exposure of the mitral valve is felt to be excellent.  The  mitral valve was carefully examined.  There was fibroelastic deficiency  with myxomatous degeneration of the mitral valve and a flailed primary  chordae tendineae in the middle portion of the middle scallop (  P2) of  the posterior leaflet of the mitral valve.  The remainder of the valve  appeared essentially normal and descending was relatively small in size.  Next,  2-0 Ethibond horizontal mattress sutures were placed  circumferentially around the entire mitral annulus to serve as  annuloplasty stitches and to facilitate exposure of the valve repair.  A  small quadrangular resection of the middle scallop (P2) of the posterior  leaflet was performed, including the segment with the ruptured primary  chordae tendineae.  Compression sutures were not necessary due to the  small size of the quadrangular resection.  The intervening vertical  defect between the two halves of the posterior leaflet were then  repaired using interrupted 4-0 Ethibond everting mattress sutures.  The  mitral valve was sized to accept a 26-mm annuloplasty ring.  A  CarboMedics memo 3D/annuloplasty ring (catalog # [id number], serial # [id number])  secured in place uneventfully.  After completion of the valve repair,  the valve was tested by instilling iced saline into the left ventricular  chamber.  The valve appeared to be perfectly competent with a broad   symmetrical line of coaptation of the anterior and posterior leaflets.   Rewarming was begun.  The left atrial appendage was oversewn from within  the left atrium using a two-layer closure of running 3-0 Prolene suture.  The left atriotomy incision was closed using a two-layer closure of  running 3-0 Prolene suture.  A single proximal saphenous vein  anastomoses was performed directly to the ascending aorta prior to  removal of the aortic crossclamp.  The left ventricular septal  temperature rose rapidly with reperfusion of the left internal mammary  artery.  The patient was placed in Trendelenburg position and the lungs  were ventilated to evacuate any residual air through the aortic root.  The aortic crossclamp was removed after total crossclamp time of 112  minutes.   The heart was defibrillated and subsequently normal sinus rhythm  resumed.  All proximal and distal coronary anastomoses were inspected  for hemostasis and appropriate graft orientation.  Epicardial pacing  wires fixed to the right ventricular free wall and to the right atrial  appendage.  The patient was rewarmed to 37 degrees centigrade  temperature.  The IVC cannula was removed and its cannulation site  oversewn with Prolene suture.  The patient was weaned from  cardiopulmonary bypass without difficulty.  The patient's rhythm at  separation from bypass was normal sinus rhythm.  No inotropic support  was required.  Total cardiopulmonary bypass time the operation was 133  minutes.  A followup transesophageal echocardiogram was performed by Dr.  Gelene Mink after separation from bypass demonstrated preserved left  ventricular function.  There was a well seated annuloplasty ring in the  mitral position with normal residual mitral regurgitation.  There was no  sign of residual air.  No other abnormalities were noted.   The venous and arterial cannulae are removed uneventfully.  Protamine  was administered to reverse  anticoagulation.  The mediastinum and left  chest were irrigated with saline solution containing vancomycin.  Meticulous surgical hemostasis was ascertained.  The mediastinum and  both left and right pleural spaces were drained using four chest tubes  exited through separate stab incisions inferiorly.  The pericardium and  soft tissues anterior to the aorta were reapproximated loosely.  The  sternum was closed with double-strength sternal wire.  The On-Q  continuous pain management system was utilized to facilitate post  postoperative pain relief.  Two 10-inch catheters supplied with the On-Q  kit were tunneled into the deep subcutaneous tissues and positioned just  lateral to the lateral border of the sternum on either side.  Each  catheter was flushed with 5 mL of 0.5% bupivacaine solution and  ultimately connected to continuous infusion pump.  The soft tissues  anterior to the sternum were closed in multiple layers and the skin was  closed with a running subcuticular skin closure.   The patient tolerated the procedure well and was transported to the  surgical intensive care unit in stable condition.  There were no  intraoperative complications.  All sponge, instrument and needle counts  were verified correct at completion of the operation.  No blood products  were administered.      Salvatore Decent. Cornelius Moras, M.D.  Electronically Signed     CHO/MEDQ  D:  07/26/2007  T:  07/26/2007  Job:  [id number]   cc:   [name]. [name], MD, [facility]  Scott M. Kriste Basque, MD

## 2010-11-18 NOTE — Discharge Summary (Signed)
NAMECOMPTON, BRIGANCE NO.:  [id number]   MEDICAL RECORD NO.:  [id number]          PATIENT TYPE:  INP   LOCATION:  2007                         FACILITY:  [facility]   PHYSICIAN:  Salvatore Decent. Cornelius Moras, M.D. DATE OF BIRTH:  Aug 18, 1935   DATE OF ADMISSION:  07/26/2007  DATE OF DISCHARGE:  07/31/2007                               DISCHARGE SUMMARY   ADDENDUM TO DISCHARGE SUMMARY:   Mr. Peed was able to be discharged home on postop day #5 as  anticipated.  Please see the previously dictated discharge summary by  Ammie Ferrier, [facility] for complete details of his admission.  The only  changes at the time of discharge were in the doses of amiodarone which  was decreased to 200 mg b.i.d.  He was also continued on Coumadin at his  regular home dosage with INR to be followed up by Dr. [name] on  January 26 or 27.  The remainder of his discharge medications  instructions and follow-up appointments are unchanged from the  previously dictated discharge summary.      Coral Ceo, P.A.      Salvatore Decent. Cornelius Moras, M.D.  Electronically Signed    GC/MEDQ  D:  08/29/2007  T:  08/30/2007  Job:  [id number]   cc:   [name]. [name], MD, [facility]  CVTS Office

## 2010-11-21 NOTE — Assessment & Plan Note (Signed)
[facility]                              CARDIOLOGY OFFICE NOTE   FLINT, HAKEEM                         MRN:          [id number]  DATE:03/31/2006                            DOB:          August 19, 1935    Mr. Struckman returns for followup today.  Since I last saw him, there is no  dyspnea, chest pain, palpitations or pedal edema.  He has noticed that when  he plays tennis and bends over to pick up a ball after taking his a.m.  medications, he can feel lightheaded to the point that he thinks he may pass  out.  However, this is transient and he has no problems at other times.   MEDICATIONS:  1. Coumadin as directed.  2. Avalide 300/12.5 mg tablets one p.o. daily.  3. Cardura 8 mg p.o. daily.  4. Zetia 10 mg p.o. daily.  5. Niaspan 500 mg p.o. nightly.  6. Aspirin 81 mg p.o. daily.  7. Toprol 50 mg p.o. daily.  8. Norvasc 10 mg p.o. daily.   PHYSICAL EXAM:  Blood pressure of 150/72 and the pulse is 50.  NECK:  Supple.  CHEST:  Clear.  CARDIOVASCULAR:  Exam reveals a bradycardic rate, but a regular rhythm.  EXTREMITIES:  No edema.   ELECTROCARDIOGRAM:  Shows a sinus bradycardia at a rate of 50.  The axis is  normal.  There are no ST changes.   DIAGNOSES:  1. History of palpitations possibly secondary to supraventricular      tachycardia.  2. Hypertension.  3. Hyperlipidemia.  4. History of pulmonary embolus.   PLAN:  Mr. Likes is complaining of episodes of dizziness with abrupt  movement to the upright position.  Certainly, his Toprol could be  contributing to this.  He only notices this in the morning while playing  tennis (bending over to pick up balls).  I have asked him to take his Toprol  after his a.m. activities to see if this helps.  If it does not, then we  will most likely decrease his Toprol to 25 mg p.o. daily, which could  potentially increase his palpitations.  Those seems to be controlled at  present.  If he is worsening in  the future, then we can consider a CardioNet  monitor to further  document and if it is indeed is an SVT, we could refer for ablation.  He  will see [place] back in 1 year.            ______________________________  [name]. [name], MD, [facility]     BSC/MedQ  DD:  03/31/2006  DT:  04/02/2006  Job #:  [id number]   cc:   [name]. Kriste Basque, MD

## 2010-11-21 NOTE — Assessment & Plan Note (Signed)
[facility] [facility]                              CARDIOLOGY OFFICE NOTE   John [name], John [name]                         MRN:          [id number]  DATE:03/11/2006                            DOB:          03-20-1936    Return office visit for lipid clinic.   PAST MEDICAL HISTORY:  Hyperlipidemia, paroxysmal atrial tachycardia,  hypertension, history of pulmonary embolus and DVT.  Ejection fraction 59%.   MEDICATIONS:  Coumadin 2.5 mg as directed, Avalide 300/12.5 mg daily,  Cardura 8 mg daily, Toprol 50 mg daily, Norvasc 10 mg daily, aspirin 81 mg  daily, Niaspan 500 mg at bedtime, Zetia 10 mg daily.   PHYSICAL EXAMINATION:  VITAL SIGNS:  Weight 186 pounds.  Blood pressure  130/70, heart rate 60.   LABORATORY DATA:  Total cholesterol 196, triglycerides 122, HDL 51, LDL 120.  LFTs within normal limits.   ASSESSMENT:  John [name] is a very pleasant 75 year old gentleman who returns  to the lipid clinic today with no chest pain, no shortness of breath, no  muscle aches or pains.  His complaint today though is that he has been  having dizziness episodes while playing tennis.  He says that he does drink  a lot of water, tries to stay hydrated, but he is wondering if it was the  heat that is bothering him.  At that time his heart rate is elevated but  then again he is engaged in aerobic activity.  He does not say that he feels  any palpitations __________ rhythm.  He does not have documented coronary  artery disease per a nuclear scan earlier this year.  I have encouraged him  to make an appointment with his cardiologist to follow up on the dizziness.  He says that he does not feel like his blood pressure is low.  It is normal  today, and his heart rate is also normal.  His total cholesterol is a  goal  of less than 200.  Triglycerides are goal of less than 150, HDL goal of  greater than 40 and LDL goal of less than 130.  Non-HDL is a goal of less  than 160;  however, I would encourage him to continue to strive for an LDL as  close to 100 as possible given the decreased risk of coronary disease.  He  is intolerant to statin therapy.   PLAN:  1. Continue current medications.  2. Continue exercise.  Continue heart healthy diet.  3. Followup visit in six months for lipid panel and LFTs and make      adjustments that will be needed at that time.                                 Leota Sauers, PharmD                            Jesse Sans. Daleen Squibb, MD, [facility]   LC/MedQ  DD:  03/11/2006  DT:  03/12/2006  Job #:  [id number]

## 2010-11-26 ENCOUNTER — Ambulatory Visit (INDEPENDENT_AMBULATORY_CARE_PROVIDER_SITE_OTHER): Payer: Medicare Other | Admitting: *Deleted

## 2010-11-26 DIAGNOSIS — I2699 Other pulmonary embolism without acute cor pulmonale: Secondary | ICD-10-CM

## 2010-11-26 DIAGNOSIS — Z7901 Long term (current) use of anticoagulants: Secondary | ICD-10-CM

## 2010-11-26 DIAGNOSIS — I82409 Acute embolism and thrombosis of unspecified deep veins of unspecified lower extremity: Secondary | ICD-10-CM

## 2010-11-26 LAB — POCT INR: INR: 2.2

## 2010-12-24 ENCOUNTER — Ambulatory Visit (INDEPENDENT_AMBULATORY_CARE_PROVIDER_SITE_OTHER): Payer: Medicare Other | Admitting: *Deleted

## 2010-12-24 DIAGNOSIS — I82409 Acute embolism and thrombosis of unspecified deep veins of unspecified lower extremity: Secondary | ICD-10-CM

## 2010-12-24 DIAGNOSIS — I2699 Other pulmonary embolism without acute cor pulmonale: Secondary | ICD-10-CM

## 2010-12-24 DIAGNOSIS — Z7901 Long term (current) use of anticoagulants: Secondary | ICD-10-CM

## 2011-01-14 ENCOUNTER — Ambulatory Visit (INDEPENDENT_AMBULATORY_CARE_PROVIDER_SITE_OTHER): Payer: Medicare Other | Admitting: *Deleted

## 2011-01-14 DIAGNOSIS — I2699 Other pulmonary embolism without acute cor pulmonale: Secondary | ICD-10-CM

## 2011-01-14 DIAGNOSIS — I82409 Acute embolism and thrombosis of unspecified deep veins of unspecified lower extremity: Secondary | ICD-10-CM

## 2011-01-14 DIAGNOSIS — Z7901 Long term (current) use of anticoagulants: Secondary | ICD-10-CM

## 2011-02-11 ENCOUNTER — Ambulatory Visit (INDEPENDENT_AMBULATORY_CARE_PROVIDER_SITE_OTHER): Payer: Medicare Other | Admitting: *Deleted

## 2011-02-11 DIAGNOSIS — I82409 Acute embolism and thrombosis of unspecified deep veins of unspecified lower extremity: Secondary | ICD-10-CM

## 2011-02-11 DIAGNOSIS — Z7901 Long term (current) use of anticoagulants: Secondary | ICD-10-CM

## 2011-02-11 DIAGNOSIS — I2699 Other pulmonary embolism without acute cor pulmonale: Secondary | ICD-10-CM

## 2011-02-11 LAB — POCT INR: INR: 1.9

## 2011-03-10 ENCOUNTER — Ambulatory Visit (INDEPENDENT_AMBULATORY_CARE_PROVIDER_SITE_OTHER): Payer: Medicare Other | Admitting: Pulmonary Disease

## 2011-03-10 ENCOUNTER — Encounter: Payer: Self-pay | Admitting: Gastroenterology

## 2011-03-10 ENCOUNTER — Encounter: Payer: Self-pay | Admitting: Pulmonary Disease

## 2011-03-10 DIAGNOSIS — C61 Malignant neoplasm of prostate: Secondary | ICD-10-CM

## 2011-03-10 DIAGNOSIS — I4891 Unspecified atrial fibrillation: Secondary | ICD-10-CM

## 2011-03-10 DIAGNOSIS — K219 Gastro-esophageal reflux disease without esophagitis: Secondary | ICD-10-CM

## 2011-03-10 DIAGNOSIS — I359 Nonrheumatic aortic valve disorder, unspecified: Secondary | ICD-10-CM

## 2011-03-10 DIAGNOSIS — I25810 Atherosclerosis of coronary artery bypass graft(s) without angina pectoris: Secondary | ICD-10-CM

## 2011-03-10 DIAGNOSIS — E785 Hyperlipidemia, unspecified: Secondary | ICD-10-CM

## 2011-03-10 DIAGNOSIS — I10 Essential (primary) hypertension: Secondary | ICD-10-CM

## 2011-03-10 DIAGNOSIS — F410 Panic disorder [episodic paroxysmal anxiety] without agoraphobia: Secondary | ICD-10-CM

## 2011-03-10 DIAGNOSIS — I6529 Occlusion and stenosis of unspecified carotid artery: Secondary | ICD-10-CM

## 2011-03-10 DIAGNOSIS — I82409 Acute embolism and thrombosis of unspecified deep veins of unspecified lower extremity: Secondary | ICD-10-CM

## 2011-03-10 DIAGNOSIS — I059 Rheumatic mitral valve disease, unspecified: Secondary | ICD-10-CM

## 2011-03-10 DIAGNOSIS — M1990 Unspecified osteoarthritis, unspecified site: Secondary | ICD-10-CM

## 2011-03-10 MED ORDER — CLONAZEPAM 0.5 MG PO TABS: 0.5000 mg | ORAL_TABLET | Freq: Two times a day (BID) | ORAL | Status: DC

## 2011-03-10 MED ORDER — WARFARIN SODIUM 2.5 MG PO TABS: 2.5000 mg | ORAL_TABLET | ORAL | Status: DC

## 2011-03-10 NOTE — Patient Instructions (Signed)
Today we updated your med list in EPIC...    We refilled your Coumadin 2.5mg  tabs per request...    We added the new KLONOPIN to take up to 2 daily for the anxiety/ panic attacks try 1/2 tab in am & afternoon, w/ 1 tab at edtime to help you rest).  Today we did your follow up CXR... Please return to our lab one morning this week for your follow up fasting blood work...    Please call the PHONE TREE in a few days for your results...    Dial [id number] & when prompted enter your patient number followed by the # symbol...    Your patient number is:   [id number]#  If your lipid panel is still not at the goal for a fellow w/ prev heart surg, then we will refer you to the Pharmacists in our Lipid clinic for their input into your dilemma...  We will arrange for a Cardiology f/u w/ DrCrenshaw, & we will refer you to the GI Dept for the needed screening colonoscopy at your convenience...  Call for any questions...  Let's plan a follow up visit in 6 months, sooner if needed for problems.Marland KitchenMarland Kitchen
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
## 2011-03-11 ENCOUNTER — Other Ambulatory Visit: Payer: Self-pay | Admitting: Pulmonary Disease

## 2011-03-11 ENCOUNTER — Ambulatory Visit (INDEPENDENT_AMBULATORY_CARE_PROVIDER_SITE_OTHER): Payer: Medicare Other | Admitting: *Deleted

## 2011-03-11 ENCOUNTER — Other Ambulatory Visit (INDEPENDENT_AMBULATORY_CARE_PROVIDER_SITE_OTHER): Payer: Medicare Other | Admitting: *Deleted

## 2011-03-11 DIAGNOSIS — I10 Essential (primary) hypertension: Secondary | ICD-10-CM

## 2011-03-11 DIAGNOSIS — E785 Hyperlipidemia, unspecified: Secondary | ICD-10-CM

## 2011-03-11 DIAGNOSIS — Z7901 Long term (current) use of anticoagulants: Secondary | ICD-10-CM

## 2011-03-11 DIAGNOSIS — I2699 Other pulmonary embolism without acute cor pulmonale: Secondary | ICD-10-CM

## 2011-03-11 DIAGNOSIS — F410 Panic disorder [episodic paroxysmal anxiety] without agoraphobia: Secondary | ICD-10-CM

## 2011-03-11 DIAGNOSIS — I82409 Acute embolism and thrombosis of unspecified deep veins of unspecified lower extremity: Secondary | ICD-10-CM

## 2011-03-11 DIAGNOSIS — I4891 Unspecified atrial fibrillation: Secondary | ICD-10-CM

## 2011-03-11 LAB — LIPID PANEL
HDL: 52.2 mg/dL (ref >39.00)
VLDL: 28.6 mg/dL (ref 0.0–40.0)

## 2011-03-11 LAB — HEPATIC FUNCTION PANEL
AST: 39 U/L — ABNORMAL HIGH (ref 0–37)
Alkaline Phosphatase: 110 U/L (ref 39–117)
Total Bilirubin: 0.6 mg/dL (ref 0.3–1.2)

## 2011-03-11 LAB — CBC WITH DIFFERENTIAL/PLATELET
Basophils Absolute: 0 10*3/uL (ref 0.0–0.1)
Eosinophils Absolute: 0.1 10*3/uL (ref 0.0–0.7)
HCT: 42 % (ref 39.0–52.0)
Lymphs Abs: 1.4 10*3/uL (ref 0.7–4.0)
MCHC: 33.5 g/dL (ref 30.0–36.0)
MCV: 86 fl (ref 78.0–100.0)
Monocytes Absolute: 0.4 10*3/uL (ref 0.1–1.0)
Platelets: 159 10*3/uL (ref 150.0–400.0)
RDW: 14.3 % (ref 11.5–14.6)

## 2011-03-11 LAB — BASIC METABOLIC PANEL
Chloride: 107 mEq/L (ref 96–112)
GFR: 76.99 mL/min (ref >60.00)
Glucose, Bld: 99 mg/dL (ref 70–99)
Potassium: 4.7 mEq/L (ref 3.5–5.1)
Sodium: 141 mEq/L (ref 135–145)

## 2011-03-12 ENCOUNTER — Encounter: Payer: Self-pay | Admitting: Cardiology

## 2011-03-17 ENCOUNTER — Ambulatory Visit (INDEPENDENT_AMBULATORY_CARE_PROVIDER_SITE_OTHER): Payer: Medicare Other | Admitting: Cardiology

## 2011-03-17 ENCOUNTER — Encounter: Payer: Self-pay | Admitting: Cardiology

## 2011-03-17 DIAGNOSIS — E785 Hyperlipidemia, unspecified: Secondary | ICD-10-CM

## 2011-03-17 DIAGNOSIS — I6529 Occlusion and stenosis of unspecified carotid artery: Secondary | ICD-10-CM

## 2011-03-17 DIAGNOSIS — I10 Essential (primary) hypertension: Secondary | ICD-10-CM

## 2011-03-17 DIAGNOSIS — Z951 Presence of aortocoronary bypass graft: Secondary | ICD-10-CM | POA: Insufficient documentation

## 2011-03-17 DIAGNOSIS — I2510 Atherosclerotic heart disease of native coronary artery without angina pectoris: Secondary | ICD-10-CM

## 2011-03-17 DIAGNOSIS — Z9889 Other specified postprocedural states: Secondary | ICD-10-CM

## 2011-03-17 DIAGNOSIS — I059 Rheumatic mitral valve disease, unspecified: Secondary | ICD-10-CM

## 2011-03-17 NOTE — Assessment & Plan Note (Signed)
Intolerant to statins. Continue present medications.

## 2011-03-17 NOTE — Assessment & Plan Note (Signed)
Continue aspirin.Repeat carotid Dopplers. 

## 2011-03-17 NOTE — Progress Notes (Signed)
HPI: [name] is a pleasant gentleman who has a history of coronary artery disease status post coronary bypassing graft, as well as mitral valve repair in January 2009.  He had an echocardiogram performed in June of 2011  that showed normal LV function.  There was a mitral annuloplasty ring with mild MR and MS; mild LAE.  The patient  had an  Myoview performed on July 12, 2008.  This was normal with an ejection fraction of 66%. Last carotid Dopplers performed in October 2010 showed 40-59% bilateral stenosis. I last saw him in May of 2011. Since then the patient denies any dyspnea on exertion, orthopnea, PND, pedal edema, palpitations, syncope or chest pain.  Current Outpatient Prescriptions  Medication Sig Dispense Refill  . amLODipine (NORVASC) 10 MG tablet Take 0.5 tablets (5 mg total) by mouth daily. 1/2 tab po qd  45 tablet  3  . aspirin 81 MG tablet Take 81 mg by mouth daily.        . clonazePAM (KLONOPIN) 0.5 MG tablet Take 1 tablet (0.5 mg total) by mouth 2 (two) times daily. As directed  60 tablet  5  . colesevelam (WELCHOL) 625 MG tablet Take 1,875 mg by mouth daily.        Marland Kitchen doxazosin (CARDURA) 8 MG tablet Take 1 tablet (8 mg total) by mouth at bedtime.  90 tablet  3  . ezetimibe (ZETIA) 10 MG tablet Take 1 tablet (10 mg total) by mouth daily.  90 tablet  3  . losartan (COZAAR) 100 MG tablet Take 1 tablet (100 mg total) by mouth daily.  90 tablet  3  . metoprolol (TOPROL-XL) 50 MG 24 hr tablet Take 1 tablet (50 mg total) by mouth daily.  90 tablet  3  . warfarin (COUMADIN) 2.5 MG tablet Take 1 tablet (2.5 mg total) by mouth as directed.  90 tablet  6     Past Medical History  Diagnosis Date  . PE (pulmonary embolism)   . Deep vein thrombophlebitis of leg   . Essential hypertension, benign   . CAD (coronary artery disease)     s/p CABG  . S/P mitral valve replacement   . Carotid stenosis   . Hyperlipidemia   . GERD (gastroesophageal reflux disease)   . Hemorrhoids   . Prostate  cancer   . DJD (degenerative joint disease)   . Sebaceous cyst     neck  . Seborrheic dermatitis     Past Surgical History  Procedure Date  . Tandem and ovoid insertion   . Vasectomy   . Prostate cancer surgery 11/1996    radial retropubic prostatectomy  . Coronary artery bypass graft     x2  . Mitral valve repair   . Bilateral shoulder surgery   . Left knee arthroscopy     History   Social History  . Marital Status: Married    Spouse Name: margaret    Number of Children: 3  . Years of Education: N/A   Occupational History  . retired    Social History Main Topics  . Smoking status: Former Smoker    Types: Cigarettes    Quit date: 07/06/1968  . Smokeless tobacco: Not on file  . Alcohol Use: Yes     social use  . Drug Use: No  . Sexually Active: Not on file   Other Topics Concern  . Not on file   Social History Narrative  . No narrative on file    ROS:  no fevers or chills, productive cough, hemoptysis, dysphasia, odynophagia, melena, hematochezia, dysuria, hematuria, rash, seizure activity, orthopnea, PND, pedal edema, claudication. Remaining systems are negative.  Physical Exam: Well-developed well-nourished in no acute distress.  Skin is warm and dry.  HEENT is normal.  Neck is supple. No thyromegaly.  Chest is clear to auscultation with normal expansion.  Cardiovascular exam is regular rate and rhythm.  Abdominal exam nontender or distended. No masses palpated. Extremities show no edema. neuro grossly intact  ECG normal sinus rhythm with no ST changes.

## 2011-03-17 NOTE — Assessment & Plan Note (Signed)
History of mitral valve repair. Continue SBE prophylaxis. Repeat echocardiogram in one year.

## 2011-03-17 NOTE — Assessment & Plan Note (Signed)
Blood pressure controlled. Continue present medications. 

## 2011-03-17 NOTE — Patient Instructions (Addendum)
Your physician recommends that you schedule a follow-up appointment in: YEAR WITH DR [name]  Your physician recommends that you continue on your current medications as directed. Please refer to the Current Medication list given to you today.  Your physician has requested that you have a carotid duplex. This test is an ultrasound of the carotid arteries in your neck. It looks at blood flow through these arteries that supply the brain with blood. Allow one hour for this exam. There are no restrictions or special instructions. DX STENOSIS

## 2011-03-17 NOTE — Assessment & Plan Note (Signed)
Continue aspirin and beta blocker. Intolerant to statins. 

## 2011-03-19 ENCOUNTER — Other Ambulatory Visit: Payer: Self-pay | Admitting: Pulmonary Disease

## 2011-03-19 DIAGNOSIS — E785 Hyperlipidemia, unspecified: Secondary | ICD-10-CM

## 2011-03-23 ENCOUNTER — Ambulatory Visit: Payer: Medicare Other

## 2011-03-23 ENCOUNTER — Encounter: Payer: Self-pay | Admitting: Pulmonary Disease

## 2011-03-26 ENCOUNTER — Ambulatory Visit (INDEPENDENT_AMBULATORY_CARE_PROVIDER_SITE_OTHER): Payer: Medicare Other

## 2011-03-26 DIAGNOSIS — E7800 Pure hypercholesterolemia, unspecified: Secondary | ICD-10-CM

## 2011-03-26 DIAGNOSIS — E785 Hyperlipidemia, unspecified: Secondary | ICD-10-CM

## 2011-03-26 LAB — COMPREHENSIVE METABOLIC PANEL
AST: 24
Albumin: 4.4
Alkaline Phosphatase: 95
Chloride: 106
Creatinine, Ser: 1.13
GFR calc Af Amer: >60
Potassium: 3.8
Total Bilirubin: 0.9

## 2011-03-26 LAB — POCT I-STAT 4, (NA,K, GLUC, HGB,HCT)
Glucose, Bld: 105 — ABNORMAL HIGH
Glucose, Bld: 113 — ABNORMAL HIGH
Glucose, Bld: 116 — ABNORMAL HIGH
Glucose, Bld: 120 — ABNORMAL HIGH
Glucose, Bld: 125 — ABNORMAL HIGH
HCT: 18 — ABNORMAL LOW
HCT: 20 — ABNORMAL LOW
HCT: 23 — ABNORMAL LOW
HCT: 28 — ABNORMAL LOW
HCT: 31 — ABNORMAL LOW
Hemoglobin: 6.1 — CL
Hemoglobin: 6.8 — CL
Hemoglobin: 7.1 — CL
Operator id: 257021
Operator id: 3342
Operator id: 3342
Potassium: 4.1
Potassium: 4.2
Potassium: 4.6
Sodium: 136
Sodium: 137

## 2011-03-26 LAB — CBC
HCT: 27 — ABNORMAL LOW
HCT: 27.7 — ABNORMAL LOW
Hemoglobin: 7.8 — CL
Hemoglobin: 9 — ABNORMAL LOW
Hemoglobin: 9.5 — ABNORMAL LOW
MCHC: 33.9
MCHC: 34.3
MCHC: 34.7
MCV: 87.4
MCV: 87.6
Platelets: 180
Platelets: 84 — ABNORMAL LOW
Platelets: 92 — ABNORMAL LOW
RBC: 2.64 — ABNORMAL LOW
RBC: 2.96 — ABNORMAL LOW
RBC: 3.24 — ABNORMAL LOW
RDW: 14.3
RDW: 14.4
RDW: 14.9
WBC: 14.6 — ABNORMAL HIGH
WBC: 18.6 — ABNORMAL HIGH
WBC: 6.2

## 2011-03-26 LAB — I-STAT EC8
BUN: 21
Bicarbonate: 24.7 — ABNORMAL HIGH
Glucose, Bld: 120 — ABNORMAL HIGH
pCO2 arterial: 45.8 — ABNORMAL HIGH
pH, Arterial: 7.34 — ABNORMAL LOW

## 2011-03-26 LAB — BASIC METABOLIC PANEL
BUN: 14
CO2: 28
CO2: 30
Chloride: 104
Chloride: 105
Chloride: 108
Creatinine, Ser: 0.87
Creatinine, Ser: 1
GFR calc Af Amer: >60
GFR calc non Af Amer: >60
GFR calc non Af Amer: >60
Glucose, Bld: 113 — ABNORMAL HIGH
Glucose, Bld: 86
Potassium: 3.7
Potassium: 3.9
Sodium: 140

## 2011-03-26 LAB — PREPARE FRESH FROZEN PLASMA

## 2011-03-26 LAB — POCT I-STAT GLUCOSE
Glucose, Bld: 117 — ABNORMAL HIGH
Operator id: 3342

## 2011-03-26 LAB — POCT I-STAT 3, ART BLOOD GAS (G3+)
Acid-Base Excess: 1
Acid-base deficit: 1
Acid-base deficit: 2
Acid-base deficit: 3 — ABNORMAL HIGH
Bicarbonate: 24.9 — ABNORMAL HIGH
Bicarbonate: 25.4 — ABNORMAL HIGH
O2 Saturation: 100
O2 Saturation: 100
O2 Saturation: 98
O2 Saturation: 99
Operator id: 209041
Operator id: 257021
Operator id: 3342
Patient temperature: 36.4
Patient temperature: 37.1
TCO2: 23
TCO2: 22
TCO2: 26
TCO2: 27
pCO2 arterial: 33.2 — ABNORMAL LOW
pCO2 arterial: 35.1
pH, Arterial: 7.421
pH, Arterial: 7.374
pO2, Arterial: 203 — ABNORMAL HIGH
pO2, Arterial: 113 — ABNORMAL HIGH
pO2, Arterial: 388 — ABNORMAL HIGH

## 2011-03-26 LAB — BLOOD GAS, ARTERIAL
Drawn by: 181601
pCO2 arterial: 35.9
pH, Arterial: 7.407

## 2011-03-26 LAB — CREATININE, SERUM
Creatinine, Ser: 1
Creatinine, Ser: 1.08
GFR calc Af Amer: >60
GFR calc Af Amer: >60

## 2011-03-26 LAB — PROTIME-INR
INR: 1.6 — ABNORMAL HIGH
INR: 1.8 — ABNORMAL HIGH
Prothrombin Time: 19.4 — ABNORMAL HIGH
Prothrombin Time: 20.3 — ABNORMAL HIGH
Prothrombin Time: 21.4 — ABNORMAL HIGH

## 2011-03-26 LAB — POCT I-STAT 3, VENOUS BLOOD GAS (G3P V)
Bicarbonate: 22.5
Operator id: 141321
Operator id: 3342
TCO2: 23
pCO2, Ven: 34.3 — ABNORMAL LOW
pH, Ven: 7.382 — ABNORMAL HIGH
pH, Ven: 7.413 — ABNORMAL HIGH
pO2, Ven: 42

## 2011-03-26 LAB — HEMOGLOBIN AND HEMATOCRIT, BLOOD
HCT: 22 — ABNORMAL LOW
Hemoglobin: 7.7 — CL

## 2011-03-26 LAB — URINALYSIS, ROUTINE W REFLEX MICROSCOPIC
Nitrite: NEGATIVE
Specific Gravity, Urine: 1.022
pH: 5

## 2011-03-26 LAB — PLATELET COUNT: Platelets: 84 — ABNORMAL LOW

## 2011-03-26 LAB — MAGNESIUM: Magnesium: 2.9 — ABNORMAL HIGH

## 2011-03-26 LAB — ABO/RH: ABO/RH(D): O POS

## 2011-03-26 LAB — TYPE AND SCREEN: Antibody Screen: NEGATIVE

## 2011-03-26 LAB — APTT: aPTT: 36

## 2011-03-26 LAB — PREPARE RBC (CROSSMATCH)

## 2011-03-26 MED ORDER — PRAVASTATIN SODIUM 40 MG PO TABS: 40.0000 mg | ORAL_TABLET | Freq: Every evening | ORAL | Status: DC

## 2011-03-26 NOTE — Assessment & Plan Note (Signed)
CV Risk Assessment Risk Factors: prior MI s/p CABG, Family Hx of CVD in parents Positive Factors: high HDL LDL Goal<100 (optimal <40), HDL Goal>40, TG Goal<150  Recommendations & Assessment 1. Start taking pravastatin 40mg  daily 2. Continue taking Zetia 10mg  daily 3. Return for f/u labs & visit in 2 months.
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
## 2011-03-26 NOTE — Patient Instructions (Signed)
Start taking Pravastatin 40mg  daily Continue taking Zetia 10mg  daily  Return for labs on 06/08/2011 Next visit on 06/11/2011

## 2011-04-06 ENCOUNTER — Encounter: Payer: Medicare Other | Admitting: *Deleted

## 2011-04-06 ENCOUNTER — Ambulatory Visit (INDEPENDENT_AMBULATORY_CARE_PROVIDER_SITE_OTHER): Payer: Medicare Other | Admitting: Gastroenterology

## 2011-04-06 ENCOUNTER — Ambulatory Visit (INDEPENDENT_AMBULATORY_CARE_PROVIDER_SITE_OTHER): Payer: Medicare Other | Admitting: *Deleted

## 2011-04-06 ENCOUNTER — Encounter: Payer: Self-pay | Admitting: Gastroenterology

## 2011-04-06 VITALS — BP 124/60 | HR 64 | Ht 69.0 in | Wt 185.0 lb

## 2011-04-06 DIAGNOSIS — Z1211 Encounter for screening for malignant neoplasm of colon: Secondary | ICD-10-CM

## 2011-04-06 DIAGNOSIS — Z7901 Long term (current) use of anticoagulants: Secondary | ICD-10-CM

## 2011-04-06 DIAGNOSIS — I82409 Acute embolism and thrombosis of unspecified deep veins of unspecified lower extremity: Secondary | ICD-10-CM

## 2011-04-06 DIAGNOSIS — I2699 Other pulmonary embolism without acute cor pulmonale: Secondary | ICD-10-CM

## 2011-04-06 LAB — POCT INR: INR: 3.1

## 2011-04-06 MED ORDER — NA SULFATE-K SULFATE-MG SULF 17.5-3.13-1.6 GM/177ML PO SOLN: 1.0000 | Freq: Every day | ORAL | Status: DC

## 2011-04-06 NOTE — Progress Notes (Addendum)
History of Present Illness: This is a 75 year old male referred for colorectal cancer screening. He underwent upper endoscopy and flexible sigmoidoscopy by Dr. Terrial Rhodes in 1998 showing gastritis, a sliding hiatal hernia and internal hemorrhoids. He has not previously had a colonoscopy. He has no gastrointestinal complaints. He is maintained on Coumadin anticoagulation for a history of DVTs, afib and a pulmonary embolism. He has stable coronary artery disease, status post CABG, status post mitral valve repair, hypertension, and hyperlipidemia.  Review of Systems: Pertinent positive and negative review of systems were noted in the above HPI section. All other review of systems were otherwise negative.  Current Medications, Allergies, Past Medical History, Past Surgical History, Family History and Social History were reviewed in [employer] record.  Physical Exam: General: Well developed , well nourished, no acute distress Head: Normocephalic and atraumatic Eyes:  sclerae anicteric, EOMI Ears: Normal auditory acuity Mouth: No deformity or lesions Neck: Supple, no masses or thyromegaly Lungs: Clear throughout to auscultation Heart: Regular rate and rhythm; no murmurs, rubs or bruits Abdomen: Soft, non tender and non distended. No masses, hepatosplenomegaly or hernias noted. Normal Bowel sounds Rectal: Deferred to colonoscopy Musculoskeletal: Symmetrical with no gross deformities  Skin: No lesions on visible extremities Pulses:  Normal pulses noted Extremities: No clubbing, cyanosis, edema or deformities noted Neurological: Alert oriented x 4, grossly nonfocal Cervical Nodes:  No significant cervical adenopathy Inguinal Nodes: No significant inguinal adenopathy Psychological:  Alert and cooperative. Normal mood and affect  Assessment and Recommendations:  1. Average risk for colorectal cancer. Patient interested in colorectal cancer screening with colonoscopy. The  risks, benefits, and alternatives to colonoscopy with possible biopsy and possible polypectomy were discussed with the patient and they consent to proceed.   2. Warfarin anti-coagulation. Plan for 5 day old with warfarin. The risks, benefits and alternatives to holding warfarin were discussed with the patient and he agrees to proceed. Will obtain clearance with Dr. Kriste Basque.

## 2011-04-06 NOTE — Patient Instructions (Signed)
You have been scheduled for a Colonoscopy. See separate instructions.  You will be contaced by our office prior to your procedure for directions on holding your Coumadin/Warfarin.  If you do not hear from our office 1 week prior to your scheduled procedure, please call [phone] to discuss. cc: Alroy Dust, MD

## 2011-04-08 ENCOUNTER — Telehealth: Payer: Self-pay

## 2011-04-08 ENCOUNTER — Encounter: Payer: Medicare Other | Admitting: *Deleted

## 2011-04-09 NOTE — Telephone Encounter (Signed)
Pt notified to come off 5 days prior to his procedure per Dr. Kriste Basque. Pt verbalized understanding.

## 2011-04-10 LAB — POCT I-STAT, CHEM 8
HCT: 41 % (ref 39.0–52.0)
Hemoglobin: 13.9 g/dL (ref 13.0–17.0)
Potassium: 4.2 mEq/L (ref 3.5–5.1)
Sodium: 140 mEq/L (ref 135–145)

## 2011-04-10 LAB — TROPONIN I: Troponin I: <0.01 ng/mL (ref 0.00–0.06)

## 2011-04-10 LAB — CARDIAC PANEL(CRET KIN+CKTOT+MB+TROPI)
CK, MB: 1.4 ng/mL (ref 0.3–4.0)
CK, MB: 1.5 ng/mL (ref 0.3–4.0)
Relative Index: 1.3 (ref 0.0–2.5)
Total CK: 109 U/L (ref 7–232)
Total CK: 96 U/L (ref 7–232)
Troponin I: 0.02 ng/mL (ref 0.00–0.06)

## 2011-04-10 LAB — PROTIME-INR
INR: 2.5 — ABNORMAL HIGH (ref 0.00–1.49)
INR: 2.7 — ABNORMAL HIGH (ref 0.00–1.49)
Prothrombin Time: 28.7 seconds — ABNORMAL HIGH (ref 11.6–15.2)

## 2011-04-10 LAB — CBC
Hemoglobin: 13.2 g/dL (ref 13.0–17.0)
MCHC: 32.7 g/dL (ref 30.0–36.0)
RBC: 4.67 MIL/uL (ref 4.22–5.81)

## 2011-04-10 LAB — D-DIMER, QUANTITATIVE: D-Dimer, Quant: 0.27 ug/mL-FEU (ref 0.00–0.48)

## 2011-04-10 LAB — POCT CARDIAC MARKERS
CKMB, poc: 1 ng/mL (ref 1.0–8.0)
Myoglobin, poc: 62 ng/mL (ref 12–200)
Troponin i, poc: <0.05 ng/mL (ref 0.00–0.09)

## 2011-04-10 LAB — SEDIMENTATION RATE: Sed Rate: 3 mm/hr (ref 0–16)

## 2011-04-27 ENCOUNTER — Encounter (INDEPENDENT_AMBULATORY_CARE_PROVIDER_SITE_OTHER): Payer: Medicare Other | Admitting: *Deleted

## 2011-04-27 DIAGNOSIS — I6529 Occlusion and stenosis of unspecified carotid artery: Secondary | ICD-10-CM

## 2011-04-27 DIAGNOSIS — I2510 Atherosclerotic heart disease of native coronary artery without angina pectoris: Secondary | ICD-10-CM

## 2011-05-07 DIAGNOSIS — D126 Benign neoplasm of colon, unspecified: Secondary | ICD-10-CM

## 2011-05-07 HISTORY — DX: Benign neoplasm of colon, unspecified: D12.6

## 2011-05-11 ENCOUNTER — Encounter: Payer: Self-pay | Admitting: *Deleted

## 2011-05-11 ENCOUNTER — Ambulatory Visit (INDEPENDENT_AMBULATORY_CARE_PROVIDER_SITE_OTHER): Payer: Medicare Other | Admitting: *Deleted

## 2011-05-11 DIAGNOSIS — I2699 Other pulmonary embolism without acute cor pulmonale: Secondary | ICD-10-CM

## 2011-05-11 DIAGNOSIS — I82409 Acute embolism and thrombosis of unspecified deep veins of unspecified lower extremity: Secondary | ICD-10-CM

## 2011-05-11 DIAGNOSIS — Z7901 Long term (current) use of anticoagulants: Secondary | ICD-10-CM

## 2011-05-20 ENCOUNTER — Ambulatory Visit (AMBULATORY_SURGERY_CENTER): Payer: Medicare Other | Admitting: Gastroenterology

## 2011-05-20 ENCOUNTER — Encounter: Payer: Self-pay | Admitting: Gastroenterology

## 2011-05-20 VITALS — BP 146/61 | HR 72 | Temp 96.8°F | Resp 20 | Ht 69.0 in | Wt 185.0 lb

## 2011-05-20 DIAGNOSIS — D126 Benign neoplasm of colon, unspecified: Secondary | ICD-10-CM

## 2011-05-20 DIAGNOSIS — D1330 Benign neoplasm of unspecified part of small intestine: Secondary | ICD-10-CM

## 2011-05-20 DIAGNOSIS — Z1211 Encounter for screening for malignant neoplasm of colon: Secondary | ICD-10-CM

## 2011-05-20 MED ORDER — SODIUM CHLORIDE 0.9 % IV SOLN: 500.0000 mL | INTRAVENOUS | Status: DC

## 2011-05-20 NOTE — Patient Instructions (Signed)
Please refer to your blue and neon green sheets for instructions regarding diet and activity for the rest of today.  You may resume your medications as you would normally take them.   You will receive a letter in the mail in about two weeks regarding the results of the biopsies taken today.  Colon Polyps A polyp is extra tissue that grows inside your body. Colon polyps grow in the large intestine. The large intestine, also called the colon, is part of your digestive system. It is a long, hollow tube at the end of your digestive tract where your body makes and stores stool. Most polyps are not dangerous. They are benign. This means they are not cancerous. But over time, some types of polyps can turn into cancer. Polyps that are smaller than a pea are usually not harmful. But larger polyps could someday become or may already be cancerous. To be safe, doctors remove all polyps and test them.  WHO GETS POLYPS? Anyone can get polyps, but certain people are more likely than others. You may have a greater chance of getting polyps if:  You are over 50.   You have had polyps before.   Someone in your family has had polyps.   Someone in your family has had cancer of the large intestine.   Find out if someone in your family has had polyps. You may also be more likely to get polyps if you:   Eat a lot of fatty foods.   Smoke.   Drink alcohol.   Do not exercise.   Eat too much.  SYMPTOMS  Most small polyps do not cause symptoms. People often do not know they have one until their caregiver finds it during a regular checkup or while testing them for something else. Some people do have symptoms like these:  Bleeding from the anus. You might notice blood on your underwear or on toilet paper after you have had a bowel movement.   Constipation or diarrhea that lasts more than a week.   Blood in the stool. Blood can make stool look black or it can show up as red streaks in the stool.  If you have  any of these symptoms, see your caregiver. HOW DOES THE DOCTOR TEST FOR POLYPS? The doctor can use four tests to check for polyps:  Digital rectal exam. The caregiver wears gloves and checks your rectum (the last part of the large intestine) to see if it feels normal. This test would find polyps only in the rectum. Your caregiver may need to do one of the other tests listed below to find polyps higher up in the intestine.   Barium enema. The caregiver puts a liquid called barium into your rectum before taking x-rays of your large intestine. Barium makes your intestine look white in the pictures. Polyps are dark, so they are easy to see.   Sigmoidoscopy. With this test, the caregiver can see inside your large intestine. A thin flexible tube is placed into your rectum. The device is called a sigmoidoscope, which has a light and a tiny video camera in it. The caregiver uses the sigmoidoscope to look at the last third of your large intestine.   Colonoscopy. This test is like sigmoidoscopy, but the caregiver looks at all of the large intestine. It usually requires sedation. This is the most common method for finding and removing polyps.  TREATMENT   The caregiver will remove the polyp during sigmoidoscopy or colonoscopy. The polyp is then tested   for cancer.   If you have had polyps, your caregiver may want you to get tested regularly in the future.  PREVENTION  There is not one sure way to prevent polyps. You might be able to lower your risk of getting them if you:  Eat more fruits and vegetables and less fatty food.   Do not smoke.   Avoid alcohol.   Exercise every day.   Lose weight if you are overweight.   Eating more calcium and folate can also lower your risk of getting polyps. Some foods that are rich in calcium are milk, cheese, and broccoli. Some foods that are rich in folate are chickpeas, kidney beans, and spinach.   Aspirin might help prevent polyps. Studies are under way.    Document Released: 03/18/2004 Document Revised: 03/04/2011 Document Reviewed: 08/24/2007 [employer] Patient Information 2012 [employer]. 

## 2011-05-21 ENCOUNTER — Telehealth: Payer: Self-pay

## 2011-05-21 NOTE — Telephone Encounter (Signed)

## 2011-05-25 ENCOUNTER — Encounter: Payer: Self-pay | Admitting: Gastroenterology

## 2011-06-01 ENCOUNTER — Ambulatory Visit (INDEPENDENT_AMBULATORY_CARE_PROVIDER_SITE_OTHER): Payer: Medicare Other | Admitting: *Deleted

## 2011-06-01 DIAGNOSIS — I2699 Other pulmonary embolism without acute cor pulmonale: Secondary | ICD-10-CM

## 2011-06-01 DIAGNOSIS — Z7901 Long term (current) use of anticoagulants: Secondary | ICD-10-CM

## 2011-06-01 DIAGNOSIS — I82409 Acute embolism and thrombosis of unspecified deep veins of unspecified lower extremity: Secondary | ICD-10-CM

## 2011-06-01 LAB — POCT INR: INR: 2.7

## 2011-06-08 ENCOUNTER — Other Ambulatory Visit (INDEPENDENT_AMBULATORY_CARE_PROVIDER_SITE_OTHER): Payer: Medicare Other | Admitting: *Deleted

## 2011-06-08 DIAGNOSIS — E7800 Pure hypercholesterolemia, unspecified: Secondary | ICD-10-CM

## 2011-06-08 LAB — LIPID PANEL
Cholesterol: 151 mg/dL (ref 0–200)
HDL: 61.6 mg/dL
LDL Cholesterol: 77 mg/dL (ref 0–99)
Total CHOL/HDL Ratio: 2
Triglycerides: 62 mg/dL (ref 0.0–149.0)
VLDL: 12.4 mg/dL (ref 0.0–40.0)

## 2011-06-08 LAB — HEPATIC FUNCTION PANEL
ALT: 26 U/L (ref 0–53)
AST: 28 U/L (ref 0–37)
Albumin: 4.2 g/dL (ref 3.5–5.2)
Alkaline Phosphatase: 97 U/L (ref 39–117)
Bilirubin, Direct: 0.1 mg/dL (ref 0.0–0.3)
Total Bilirubin: 0.9 mg/dL (ref 0.3–1.2)
Total Protein: 6.9 g/dL (ref 6.0–8.3)

## 2011-06-09 ENCOUNTER — Encounter: Payer: Self-pay | Admitting: *Deleted

## 2011-06-11 ENCOUNTER — Ambulatory Visit: Payer: Medicare Other

## 2011-06-15 ENCOUNTER — Ambulatory Visit: Payer: Medicare Other

## 2011-07-02 ENCOUNTER — Ambulatory Visit (INDEPENDENT_AMBULATORY_CARE_PROVIDER_SITE_OTHER): Payer: Medicare Other | Admitting: *Deleted

## 2011-07-02 DIAGNOSIS — Z7901 Long term (current) use of anticoagulants: Secondary | ICD-10-CM

## 2011-07-02 DIAGNOSIS — I82409 Acute embolism and thrombosis of unspecified deep veins of unspecified lower extremity: Secondary | ICD-10-CM

## 2011-07-02 DIAGNOSIS — I2699 Other pulmonary embolism without acute cor pulmonale: Secondary | ICD-10-CM

## 2011-07-07 ENCOUNTER — Encounter (HOSPITAL_COMMUNITY): Payer: Self-pay

## 2011-07-07 ENCOUNTER — Emergency Department (HOSPITAL_COMMUNITY)
Admission: EM | Admit: 2011-07-07 | Discharge: 2011-07-07 | Disposition: A | Discharge status: Home / Self Care | Payer: Medicare Other | Attending: Emergency Medicine | Admitting: Emergency Medicine

## 2011-07-07 DIAGNOSIS — L0291 Cutaneous abscess, unspecified: Secondary | ICD-10-CM | POA: Insufficient documentation

## 2011-07-07 DIAGNOSIS — L0390 Cellulitis, unspecified: Secondary | ICD-10-CM

## 2011-07-07 DIAGNOSIS — M79609 Pain in unspecified limb: Secondary | ICD-10-CM | POA: Insufficient documentation

## 2011-07-07 DIAGNOSIS — I25810 Atherosclerosis of coronary artery bypass graft(s) without angina pectoris: Secondary | ICD-10-CM | POA: Diagnosis not present

## 2011-07-07 DIAGNOSIS — I10 Essential (primary) hypertension: Secondary | ICD-10-CM | POA: Insufficient documentation

## 2011-07-07 DIAGNOSIS — Z7901 Long term (current) use of anticoagulants: Secondary | ICD-10-CM | POA: Diagnosis not present

## 2011-07-07 DIAGNOSIS — L02519 Cutaneous abscess of unspecified hand: Secondary | ICD-10-CM | POA: Diagnosis not present

## 2011-07-07 DIAGNOSIS — L03019 Cellulitis of unspecified finger: Secondary | ICD-10-CM | POA: Diagnosis not present

## 2011-07-07 DIAGNOSIS — Z86718 Personal history of other venous thrombosis and embolism: Secondary | ICD-10-CM | POA: Diagnosis not present

## 2011-07-07 MED ORDER — DOXYCYCLINE HYCLATE 100 MG PO CAPS: 100.0000 mg | ORAL_CAPSULE | Freq: Two times a day (BID) | ORAL | Status: AC

## 2011-07-07 MED ORDER — ACETAMINOPHEN 325 MG PO TABS
650.0000 mg | ORAL_TABLET | Freq: Once | ORAL | Status: AC
  Administered 2011-07-07: 650 mg via ORAL
  Filled 2011-07-07: qty 2

## 2011-07-07 NOTE — ED Provider Notes (Signed)
History     CSN: [id number]  Arrival date & time 07/07/11  1114   First MD Initiated Contact with Patient 07/07/11 1124      Chief Complaint  Patient presents with  . Hand Pain    LT 3rd digit swelling/pain since yesterday    (Consider location/radiation/quality/duration/timing/severity/associated sxs/prior treatment) HPI Comments: Patient reports that yesterday afternoon he noticed redness and swelling on the lateral palmer surface of the 3rd digit of the left hand.  He reports that the area is tender.  No drainage from the area.  He has never had this before.  He has not put anything on the area.  No injury or trauma to the area.  Patient is a 76 y.o. male presenting with hand pain. The history is provided by the patient.  Hand Pain Pertinent negatives include no chest pain, chills, fever, joint swelling, nausea, numbness or vomiting.    Past Medical History  Diagnosis Date  . PE (pulmonary embolism)   . Deep vein thrombophlebitis of leg   . Essential hypertension, benign   . CAD (coronary artery disease)     s/p CABG  . Carotid stenosis   . Hyperlipidemia   . GERD (gastroesophageal reflux disease)   . Hemorrhoids   . Prostate cancer   . DJD (degenerative joint disease)   . Sebaceous cyst     neck  . Seborrheic dermatitis   . Anxiety   . Hiatal hernia     Past Surgical History  Procedure Date  . Tandem and ovoid insertion   . Vasectomy   . Prostate cancer surgery 11/1996    radial retropubic prostatectomy  . Coronary artery bypass graft     x2  . Mitral valve repair   . Bilateral shoulder surgery   . Left knee arthroscopy     Family History  Problem Relation Age of Onset  . Heart attack Father   . Stroke Mother   . Hypertension Mother   . Hypertension Brother   . Heart disease Brother   . Prostate cancer Brother     History  Substance Use Topics  . Smoking status: Former Smoker    Types: Cigarettes    Quit date: 07/06/1968  . Smokeless tobacco:  Never Used  . Alcohol Use: Yes     social use      Review of Systems  Constitutional: Negative for fever and chills.  Respiratory: Negative for shortness of breath.   Cardiovascular: Negative for chest pain.  Gastrointestinal: Negative for nausea and vomiting.  Musculoskeletal: Negative for joint swelling.  Skin: Positive for color change. Negative for wound.  Neurological: Negative for numbness.    Allergies  Iodine; Iohexol; and Statins  Home Medications   Current Outpatient Rx  Name Route Sig Dispense Refill  . AMLODIPINE BESYLATE 10 MG PO TABS Oral Take 5 mg by mouth daily.      . ASPIRIN 81 MG PO TABS Oral Take 81 mg by mouth daily.      Marland Kitchen DOXAZOSIN MESYLATE 8 MG PO TABS Oral Take 1 tablet (8 mg total) by mouth at bedtime. 90 tablet 3  . EZETIMIBE 10 MG PO TABS Oral Take 1 tablet (10 mg total) by mouth daily. 90 tablet 3  . LOSARTAN POTASSIUM 100 MG PO TABS Oral Take 1 tablet (100 mg total) by mouth daily. 90 tablet 3  . METOPROLOL SUCCINATE ER 50 MG PO TB24 Oral Take 1 tablet (50 mg total) by mouth daily. 90 tablet 3  .  PRAVASTATIN SODIUM 40 MG PO TABS Oral Take 1 tablet (40 mg total) by mouth every evening. 90 tablet 3  . WARFARIN SODIUM 2.5 MG PO TABS Oral Take 1.25-2.5 mg by mouth daily. Alternating with 0.5 tablet and 1 tablet every other day.     Marland Kitchen CLONAZEPAM 0.5 MG PO TABS Oral Take 0.5 mg by mouth 2 (two) times daily. For anxiety       BP 146/74  Pulse 70  Temp(Src) 97.5 F (36.4 C) (Oral)  SpO2 99%  Physical Exam  Nursing note and vitals reviewed. Constitutional: He is oriented to person, place, and time. He appears well-developed and well-nourished. No distress.  HENT:  Head: Normocephalic and atraumatic.  Neck: Normal range of motion. Neck supple.  Cardiovascular: Normal rate, regular rhythm, normal heart sounds and normal pulses.   Pulmonary/Chest: Effort normal and breath sounds normal. He has no wheezes.  Neurological: He is alert and oriented to  person, place, and time.       Patient able to wiggle all of her fingers.  Sensation intact in all fingers.  Skin: Skin is warm, dry and intact. He is not diaphoretic.       Approximately 1 cm abscess on the anterio-ulnar aspect of the 3rd digit of the left hand.  Some surrounding edema and erythema.    Good capillary refill.   Psychiatric: He has a normal mood and affect.    ED Course  Procedures (including critical care time)  Labs Reviewed - No data to display No results found.   1. Cellulitis and abscess     INCISION AND DRAINAGE Performed by: Anne Shutter, Farha Dano Consent: Verbal consent obtained. Risks and benefits: risks, benefits and alternatives were discussed Type: abscess  Body area: Proximal right 3rd digit  Anesthesia: local infiltration  Local anesthetic: lidocaine 2% without epinephrine  Anesthetic total: 1 ml  Complexity: complex Blunt dissection to break up loculations  Drainage: purulent  Drainage amount: small  Patient tolerance: Patient tolerated the procedure well with no immediate complications.    MDM  Patient demonstrating an abscess on the 3rd digit of the left hand.  Abscess confirmed with ultrasound.  Incision and drainage performed which did express some purulent fluid.  Patient placed on antibiotic therapy and instructed to follow up with Primary Care Physician in a few days.  Patient declined any pain medication.         Pascal Lux [facility] 07/07/11 1612

## 2011-07-07 NOTE — ED Provider Notes (Signed)
Medical screening examination/treatment/procedure(s) were conducted as a shared visit with non-physician practitioner(s) and myself.  I personally evaluated the patient during the encounter  Hurman Horn, MD 07/07/11 [phone]

## 2011-07-07 NOTE — ED Provider Notes (Signed)
Left hand affected finger antero-ulnar aspect has palpable <1cm abscess with mild surrounding erythema and edema to the finger; the finger has no tenderness to the midline dorsal or volar aspects with full extension and good flexion with capillary refill less than 2 seconds and normal light touch so I doubt flexor tenosynovitis or extensor tenosynovitis and limited bedside ultrasound reveals a subcutaneous fluid collection consistent with local abscess with mild local surrounding cellulitis  Medical screening examination/treatment/procedure(s) were conducted as a shared visit with non-physician practitioner(s) and myself.  I personally evaluated the patient during the encounter  Hurman Horn, MD 07/11/11 2237

## 2011-07-07 NOTE — ED Notes (Signed)
Family at bedside. EDP at bedside PA at bedside Pt reports increased pain and redness in l/han-middle finger x 12 hrs

## 2011-07-08 ENCOUNTER — Telehealth: Payer: Self-pay | Admitting: Pulmonary Disease

## 2011-07-08 DIAGNOSIS — L0291 Cutaneous abscess, unspecified: Secondary | ICD-10-CM

## 2011-07-08 NOTE — Telephone Encounter (Signed)
Referral was sent to [facility]. Spoke with pt and notified of this and he verbalized understanding and states nothing further needed.

## 2011-07-08 NOTE — Telephone Encounter (Signed)
Per SN---hand abscess needs ortho---recs to see Dr Teressa Senter this week---this is who SN would see if needed.  --ok to set up appt with Dr. Teressa Senter for this pt.

## 2011-07-08 NOTE — Telephone Encounter (Signed)
Pt was seen at the [facility] ER yesterday for swelling of middle finger on left hand. Treated with I&D, Doxy 100mg  bid for 10 days and was told to follow up with PCP in 2 days. Pt states incision is no longer draining, entire left hand is swollen, red, pain is 4 of 10, taking Tylenol prn.  Please advise, thank you.  Allergies  Allergen Reactions  . Iodine   . Iohexol      Code: HIVES, Desc: PT ALLERGIC TO IV CONTRAST;(REACTION DURING CARDIAC CATHETERIZATION); NEEDS PREMEDS PER DR CRENSHAW!, Onset Date: [id number]   . Statins     REACTION: he is INTOL to all statins...   [facility]

## 2011-07-09 ENCOUNTER — Encounter (HOSPITAL_BASED_OUTPATIENT_CLINIC_OR_DEPARTMENT_OTHER): Payer: Self-pay | Admitting: *Deleted

## 2011-07-09 ENCOUNTER — Encounter (HOSPITAL_BASED_OUTPATIENT_CLINIC_OR_DEPARTMENT_OTHER): Payer: Self-pay | Admitting: Anesthesiology

## 2011-07-09 ENCOUNTER — Ambulatory Visit (HOSPITAL_BASED_OUTPATIENT_CLINIC_OR_DEPARTMENT_OTHER)
Admission: RE | Admit: 2011-07-09 | Discharge: 2011-07-09 | Disposition: A | Discharge status: Home / Self Care | Payer: Medicare Other | Source: Ambulatory Visit | Attending: Orthopedic Surgery | Admitting: Orthopedic Surgery

## 2011-07-09 ENCOUNTER — Ambulatory Visit (HOSPITAL_BASED_OUTPATIENT_CLINIC_OR_DEPARTMENT_OTHER): Payer: Medicare Other | Admitting: Anesthesiology

## 2011-07-09 ENCOUNTER — Encounter (HOSPITAL_BASED_OUTPATIENT_CLINIC_OR_DEPARTMENT_OTHER)
Admission: RE | Disposition: A | Discharge status: Home / Self Care | Payer: Self-pay | Source: Ambulatory Visit | Attending: Orthopedic Surgery

## 2011-07-09 ENCOUNTER — Encounter (HOSPITAL_BASED_OUTPATIENT_CLINIC_OR_DEPARTMENT_OTHER): Payer: Self-pay | Admitting: Orthopedic Surgery

## 2011-07-09 DIAGNOSIS — L02519 Cutaneous abscess of unspecified hand: Secondary | ICD-10-CM | POA: Insufficient documentation

## 2011-07-09 DIAGNOSIS — I10 Essential (primary) hypertension: Secondary | ICD-10-CM | POA: Diagnosis not present

## 2011-07-09 DIAGNOSIS — K219 Gastro-esophageal reflux disease without esophagitis: Secondary | ICD-10-CM | POA: Diagnosis not present

## 2011-07-09 DIAGNOSIS — L089 Local infection of the skin and subcutaneous tissue, unspecified: Secondary | ICD-10-CM | POA: Diagnosis not present

## 2011-07-09 DIAGNOSIS — S61409A Unspecified open wound of unspecified hand, initial encounter: Secondary | ICD-10-CM | POA: Diagnosis not present

## 2011-07-09 DIAGNOSIS — I2510 Atherosclerotic heart disease of native coronary artery without angina pectoris: Secondary | ICD-10-CM | POA: Diagnosis not present

## 2011-07-09 DIAGNOSIS — L03019 Cellulitis of unspecified finger: Secondary | ICD-10-CM | POA: Insufficient documentation

## 2011-07-09 DIAGNOSIS — M19049 Primary osteoarthritis, unspecified hand: Secondary | ICD-10-CM | POA: Diagnosis not present

## 2011-07-09 HISTORY — PX: I & D EXTREMITY: SHX5045

## 2011-07-09 LAB — POCT I-STAT, CHEM 8
BUN: 17 mg/dL (ref 6–23)
Chloride: 115 mEq/L — ABNORMAL HIGH (ref 96–112)
HCT: 41 % (ref 39.0–52.0)
Sodium: 141 mEq/L (ref 135–145)

## 2011-07-09 LAB — APTT: aPTT: 57 seconds — ABNORMAL HIGH (ref 24–37)

## 2011-07-09 LAB — PROTIME-INR: INR: 2.43 — ABNORMAL HIGH (ref 0.00–1.49)

## 2011-07-09 SURGERY — IRRIGATION AND DEBRIDEMENT EXTREMITY
Anesthesia: General | Site: Hand | Laterality: Left | Wound class: Clean Contaminated

## 2011-07-09 MED ORDER — CEFAZOLIN SODIUM 1-5 GM-% IV SOLN
INTRAVENOUS | Status: DC | PRN
  Administered 2011-07-09: 1 g via INTRAVENOUS

## 2011-07-09 MED ORDER — OXYCODONE-ACETAMINOPHEN 5-325 MG PO TABS
2.0000 | ORAL_TABLET | ORAL | Status: DC | PRN
  Administered 2011-07-09: 2 via ORAL

## 2011-07-09 MED ORDER — LACTATED RINGERS IV SOLN
INTRAVENOUS | Status: DC
  Administered 2011-07-09 (×2): via INTRAVENOUS

## 2011-07-09 MED ORDER — FENTANYL CITRATE 0.05 MG/ML IJ SOLN
25.0000 ug | INTRAMUSCULAR | Status: DC | PRN
  Administered 2011-07-09 (×2): 25 ug via INTRAVENOUS

## 2011-07-09 MED ORDER — LIDOCAINE HCL (CARDIAC) 20 MG/ML IV SOLN
INTRAVENOUS | Status: DC | PRN
  Administered 2011-07-09: 100 mg via INTRAVENOUS

## 2011-07-09 MED ORDER — FENTANYL CITRATE 0.05 MG/ML IJ SOLN
INTRAMUSCULAR | Status: DC | PRN
  Administered 2011-07-09 (×2): 50 ug via INTRAVENOUS

## 2011-07-09 MED ORDER — MORPHINE SULFATE 2 MG/ML IJ SOLN: 0.0500 mg/kg | INTRAMUSCULAR | Status: DC | PRN

## 2011-07-09 MED ORDER — PROPOFOL 10 MG/ML IV EMUL
INTRAVENOUS | Status: DC | PRN
  Administered 2011-07-09: 200 mg via INTRAVENOUS

## 2011-07-09 MED ORDER — OXYCODONE-ACETAMINOPHEN 5-325 MG PO TABS: ORAL_TABLET | ORAL | Status: AC

## 2011-07-09 MED ORDER — METOCLOPRAMIDE HCL 5 MG/ML IJ SOLN: 10.0000 mg | Freq: Once | INTRAMUSCULAR | Status: DC | PRN

## 2011-07-09 MED ORDER — MIDAZOLAM HCL 5 MG/5ML IJ SOLN
INTRAMUSCULAR | Status: DC | PRN
  Administered 2011-07-09: 1 mg via INTRAVENOUS

## 2011-07-09 MED ORDER — MIDAZOLAM HCL 2 MG/2ML IJ SOLN: 0.5000 mg | INTRAMUSCULAR | Status: DC | PRN

## 2011-07-09 MED ORDER — ACETAMINOPHEN 10 MG/ML IV SOLN
1000.0000 mg | Freq: Once | INTRAVENOUS | Status: AC
  Administered 2011-07-09: 1000 mg via INTRAVENOUS

## 2011-07-09 MED ORDER — BUPIVACAINE HCL (PF) 0.25 % IJ SOLN
INTRAMUSCULAR | Status: DC | PRN
  Administered 2011-07-09: 6 mL

## 2011-07-09 SURGICAL SUPPLY — 51 items
BAG DECANTER FOR FLEXI CONT (MISCELLANEOUS) ×2 IMPLANT
BANDAGE CONFORM 2X5YD N/S (GAUZE/BANDAGES/DRESSINGS) ×2 IMPLANT
BANDAGE ELASTIC 3 VELCRO ST LF (GAUZE/BANDAGES/DRESSINGS) IMPLANT
BANDAGE GAUZE ELAST BULKY 4 IN (GAUZE/BANDAGES/DRESSINGS) IMPLANT
BANDAGE GAUZE STRT 1 STR LF (GAUZE/BANDAGES/DRESSINGS) IMPLANT
BLADE MINI RND TIP GREEN BEAV (BLADE) IMPLANT
BLADE SURG 15 STRL LF DISP TIS (BLADE) ×2 IMPLANT
BLADE SURG 15 STRL SS (BLADE) ×2
BNDG COHESIVE 1X5 TAN STRL LF (GAUZE/BANDAGES/DRESSINGS) ×2 IMPLANT
BNDG ELASTIC 2 VLCR STRL LF (GAUZE/BANDAGES/DRESSINGS) IMPLANT
BNDG ESMARK 4X9 LF (GAUZE/BANDAGES/DRESSINGS) ×2 IMPLANT
CHLORAPREP W/TINT 26ML (MISCELLANEOUS) ×2 IMPLANT
CLOTH BEACON ORANGE TIMEOUT ST (SAFETY) ×2 IMPLANT
CORDS BIPOLAR (ELECTRODE) ×2 IMPLANT
COVER MAYO STAND STRL (DRAPES) ×2 IMPLANT
COVER TABLE BACK 60X90 (DRAPES) ×2 IMPLANT
CUFF TOURNIQUET SINGLE 18IN (TOURNIQUET CUFF) ×2 IMPLANT
DRAPE EXTREMITY T 121X128X90 (DRAPE) ×2 IMPLANT
DRAPE SURG 17X23 STRL (DRAPES) ×2 IMPLANT
DRSG XEROFORM 1X8 (GAUZE/BANDAGES/DRESSINGS) ×2 IMPLANT
GAUZE PACKING IODOFORM 1/4X5 (PACKING) ×2 IMPLANT
GAUZE XEROFORM 1X8 LF (GAUZE/BANDAGES/DRESSINGS) ×2 IMPLANT
GLOVE BIO SURGEON STRL SZ 6.5 (GLOVE) ×4 IMPLANT
GLOVE BIO SURGEON STRL SZ7.5 (GLOVE) ×2 IMPLANT
GLOVE BIOGEL PI IND STRL 8 (GLOVE) ×1 IMPLANT
GLOVE BIOGEL PI INDICATOR 8 (GLOVE) ×1
GLOVE INDICATOR 7.0 STRL GRN (GLOVE) ×4 IMPLANT
GOWN PREVENTION PLUS XLARGE (GOWN DISPOSABLE) ×2 IMPLANT
GOWN STRL REIN XL XLG (GOWN DISPOSABLE) ×2 IMPLANT
LOOP VESSEL MAXI BLUE (MISCELLANEOUS) IMPLANT
NEEDLE HYPO 25X1 1.5 SAFETY (NEEDLE) IMPLANT
NS IRRIG 1000ML POUR BTL (IV SOLUTION) ×2 IMPLANT
PACK BASIN DAY SURGERY FS (CUSTOM PROCEDURE TRAY) ×2 IMPLANT
PAD CAST 3X4 CTTN HI CHSV (CAST SUPPLIES) IMPLANT
PADDING CAST ABS 4INX4YD NS (CAST SUPPLIES) ×1
PADDING CAST ABS COTTON 4X4 ST (CAST SUPPLIES) ×1 IMPLANT
PADDING CAST COTTON 3X4 STRL (CAST SUPPLIES)
SPLINT PLASTER CAST XFAST 3X15 (CAST SUPPLIES) IMPLANT
SPLINT PLASTER XTRA FASTSET 3X (CAST SUPPLIES)
SPONGE GAUZE 4X4 12PLY (GAUZE/BANDAGES/DRESSINGS) ×2 IMPLANT
SPONGE GAUZE 4X4 FOR O.R. (GAUZE/BANDAGES/DRESSINGS) ×2 IMPLANT
STOCKINETTE 4X48 STRL (DRAPES) ×2 IMPLANT
SUT ETHILON 4 0 PS 2 18 (SUTURE) IMPLANT
SWAB CULTURE LIQ STUART DBL (MISCELLANEOUS) ×2 IMPLANT
SYR BULB 3OZ (MISCELLANEOUS) ×2 IMPLANT
SYR CONTROL 10ML LL (SYRINGE) IMPLANT
TOWEL OR 17X24 6PK STRL BLUE (TOWEL DISPOSABLE) ×4 IMPLANT
TUBE ANAEROBIC SPECIMEN COL (MISCELLANEOUS) ×2 IMPLANT
TUBE FEEDING 5FR 15 INCH (TUBING) IMPLANT
UNDERPAD 30X30 INCONTINENT (UNDERPADS AND DIAPERS) ×2 IMPLANT
WATER STERILE IRR 1000ML POUR (IV SOLUTION) IMPLANT

## 2011-07-09 NOTE — Brief Op Note (Signed)
07/09/2011  5:18 PM  PATIENT:  John [name]  76 y.o. male  PRE-OPERATIVE DIAGNOSIS:  infected left hand  POST-OPERATIVE DIAGNOSIS:  infected left hand  PROCEDURE:  Procedure(s): IRRIGATION AND DEBRIDEMENT EXTREMITY  SURGEON:  Surgeon(s): Tami Ribas  PHYSICIAN ASSISTANT:   ASSISTANTS: none   ANESTHESIA:   general  EBL:  Total I/O In: 1500 [I.V.:1500] Out: -   BLOOD ADMINISTERED:none  DRAINS: iodoform packing  LOCAL MEDICATIONS USED:  MARCAINE 6 CC  SPECIMEN:  Source of Specimen:  left long finger  DISPOSITION OF SPECIMEN:  micro  COUNTS:  YES  TOURNIQUET:   Total Tourniquet Time Documented: Upper Arm (Left) - 26 minutes  DICTATION: .Other Dictation: Dictation Number [id number]  PLAN OF CARE: Discharge to home after PACU  PATIENT DISPOSITION:  PACU - hemodynamically stable.

## 2011-07-09 NOTE — H&P (Signed)
John [name] is an 76 y.o. male.   Chief Complaint: infection HPI: 76 yo lhd male with swelling, erythema, pain left long finger.  I&D by ED 2 days ago.  Swelling and erythema returned.  Seen in office this am by Dr. Daryll Brod.  Continued purulence from small wound.    Past Medical History  Diagnosis Date  . PE (pulmonary embolism)   . Deep vein thrombophlebitis of leg   . Essential hypertension, benign   . CAD (coronary artery disease)     s/p CABG  . Carotid stenosis   . Hyperlipidemia   . GERD (gastroesophageal reflux disease)   . Hemorrhoids   . Prostate cancer   . DJD (degenerative joint disease)   . Sebaceous cyst     neck  . Seborrheic dermatitis   . Anxiety   . Hiatal hernia     Past Surgical History  Procedure Date  . Tandem and ovoid insertion   . Vasectomy   . Prostate cancer surgery 11/1996    radial retropubic prostatectomy  . Coronary artery bypass graft     x2  . Mitral valve repair   . Bilateral shoulder surgery   . Left knee arthroscopy     Family History  Problem Relation Age of Onset  . Heart attack Father   . Stroke Mother   . Hypertension Mother   . Hypertension Brother   . Heart disease Brother   . Prostate cancer Brother    Social History:  reports that he quit smoking about 43 years ago. His smoking use included Cigarettes. He has never used smokeless tobacco. He reports that he drinks alcohol. He reports that he does not use illicit drugs.  Allergies:  Allergies  Allergen Reactions  . Iodine   . Iohexol      Code: HIVES, Desc: PT ALLERGIC TO IV CONTRAST;(REACTION DURING CARDIAC CATHETERIZATION); NEEDS PREMEDS PER DR CRENSHAW!, Onset Date: [id number]   . Statins     REACTION: he is INTOL to all statins...    Medications Prior to Admission  Medication Dose Route Frequency Provider Last Rate Last Dose  . acetaminophen (OFIRMEV) IV 1,000 mg  1,000 mg Intravenous Once Constance Goltz, MD   1,000 mg at 07/09/11 1241  . lactated  ringers infusion   Intravenous Continuous Constance Goltz, MD 20 mL/hr at 07/09/11 1231    . midazolam (VERSED) injection 0.5-2 mg  0.5-2 mg Intravenous PRN Constance Goltz, MD       Medications Prior to Admission  Medication Sig Dispense Refill  . amLODipine (NORVASC) 10 MG tablet Take 5 mg by mouth daily.        Marland Kitchen aspirin 81 MG tablet Take 81 mg by mouth daily.        Marland Kitchen doxazosin (CARDURA) 8 MG tablet Take 1 tablet (8 mg total) by mouth at bedtime.  90 tablet  3  . doxycycline (VIBRAMYCIN) 100 MG capsule Take 1 capsule (100 mg total) by mouth 2 (two) times daily.  20 capsule  0  . ezetimibe (ZETIA) 10 MG tablet Take 1 tablet (10 mg total) by mouth daily.  90 tablet  3  . metoprolol (TOPROL-XL) 50 MG 24 hr tablet Take 1 tablet (50 mg total) by mouth daily.  90 tablet  3  . pravastatin (PRAVACHOL) 40 MG tablet Take 1 tablet (40 mg total) by mouth every evening.  90 tablet  3  . warfarin (COUMADIN) 2.5 MG tablet Take 1.25-2.5 mg by mouth  daily. Alternating with 0.5 tablet and 1 tablet every other day.       . losartan (COZAAR) 100 MG tablet Take 1 tablet (100 mg total) by mouth daily.  90 tablet  3    Results for orders placed during the hospital encounter of 07/09/11 (from the past 48 hour(s))  POCT I-STAT, CHEM 8     Status: Abnormal   Collection Time   07/09/11 12:20 PM      Component Value Range Comment   Sodium 141  135 - 145 (mEq/L)    Potassium 4.2  3.5 - 5.1 (mEq/L)    Chloride 115 (*) 96 - 112 (mEq/L)    BUN 17  6 - 23 (mg/dL)    Creatinine, Ser 1.61  0.50 - 1.35 (mg/dL)    Glucose, Bld 096 (*) 70 - 99 (mg/dL)    Calcium, Ion 0.45 (*) 1.12 - 1.32 (mmol/L)    TCO2 16  0 - 100 (mmol/L)    Hemoglobin 13.9  13.0 - 17.0 (g/dL)    HCT 40.9  81.1 - 91.4 (%)   PROTIME-INR     Status: Abnormal   Collection Time   07/09/11 12:35 PM      Component Value Range Comment   Prothrombin Time 26.8 (*) 11.6 - 15.2 (seconds)    INR 2.43 (*) 0.00 - 1.49    APTT     Status:  Abnormal   Collection Time   07/09/11 12:35 PM      Component Value Range Comment   aPTT 57 (*) 24 - 37 (seconds)     No results found.   A comprehensive review of systems was negative.  Blood pressure 157/80, pulse 67, temperature 97.5 F (36.4 C), temperature source Oral, resp. rate 18, height 5\' 9"  (1.753 m), weight 79.379 kg (175 lb), SpO2 97.00%.  General appearance: alert, cooperative, appears stated age and no distress Head: Normocephalic, without obvious abnormality, atraumatic Neck: supple, symmetrical, trachea midline Resp: clear to auscultation bilaterally Cardio: regular rate and rhythm GI: soft, non-tender; bowel sounds normal; no masses,  no organomegaly Extremities: intact sensation and capillary refill all digits.  +epl/fpl/io.  Erythema, swelling and wound volar left long finger.  no ttp in distal, middle phalanx or over mp joint.  ttp at swollen area around wound.  can wiggle finger with no pain. Pulses: 2+ and symmetric Skin: as above Neurologic: Grossly normal Incision/Wound: As above  Assessment/Plan Left long finger abscess.  Plan I&D in OR.  Risks, benefits, alternatives discussed and patient agrees with plan of care.  Andrey Mccaskill R 07/09/2011, 4:18 PM

## 2011-07-09 NOTE — Anesthesia Procedure Notes (Signed)
Procedure Name: LMA Insertion Date/Time: 07/09/2011 4:36 PM Performed by: Zenia Resides D Pre-anesthesia Checklist: Patient identified, Emergency Drugs available, Suction available and Patient being monitored Patient Re-evaluated:Patient Re-evaluated prior to inductionOxygen Delivery Method: Circle System Utilized Preoxygenation: Pre-oxygenation with 100% oxygen Intubation Type: IV induction Ventilation: Mask ventilation without difficulty LMA: LMA inserted LMA Size: 4.0 Number of attempts: 1 Airway Equipment and Method: bite block Placement Confirmation: positive ETCO2 and breath sounds checked- equal and bilateral Tube secured with: Tape Dental Injury: Teeth and Oropharynx as per pre-operative assessment

## 2011-07-09 NOTE — Op Note (Signed)
Dictation [id number]

## 2011-07-09 NOTE — Anesthesia Postprocedure Evaluation (Signed)
  Anesthesia Post-op Note  Patient: John [name]  Procedure(s) Performed:  IRRIGATION AND DEBRIDEMENT EXTREMITY  Patient Location: PACU  Anesthesia Type: General  Level of Consciousness: awake  Airway and Oxygen Therapy: Patient Spontanous Breathing  Post-op Pain: mild  Post-op Assessment: Post-op Vital signs reviewed  Post-op Vital Signs: stable  Complications: No apparent anesthesia complications

## 2011-07-09 NOTE — Transfer of Care (Signed)
Immediate Anesthesia Transfer of Care Note  Patient: John [name]  Procedure(s) Performed:  IRRIGATION AND DEBRIDEMENT EXTREMITY  Patient Location:2 PACU  Anesthesia Type: General  Level of Consciousness: awake  Airway & Oxygen Therapy: Patient Spontanous Breathing and Patient connected to face mask oxygen  Post-op Assessment: Report given to PACU RN  Post vital signs: Reviewed  Complications: No apparent anesthesia complications

## 2011-07-09 NOTE — Anesthesia Postprocedure Evaluation (Signed)
  Anesthesia Post-op Note  Patient: John [name]  Procedure(s) Performed:  IRRIGATION AND DEBRIDEMENT EXTREMITY  Patient Location: PACU  Anesthesia Type: General  Level of Consciousness: awake  Airway and Oxygen Therapy: Patient Spontanous Breathing and Patient connected to nasal cannula oxygen  Post-op Pain: mild  Post-op Assessment: Post-op Vital signs reviewed, Patient's Cardiovascular Status Stable, Respiratory Function Stable, Patent Airway, No signs of Nausea or vomiting and Pain level controlled  Post-op Vital Signs: Reviewed and stable  Complications: No apparent anesthesia complications

## 2011-07-09 NOTE — Anesthesia Preprocedure Evaluation (Signed)
Anesthesia Evaluation  Patient identified by MRN, date of birth, ID band Patient awake    Reviewed: Allergy & Precautions, H&P , NPO status , Patient's Chart, lab work & pertinent test results, reviewed documented beta blocker date and time   Airway Mallampati: II TM Distance: >3 FB Neck ROM: full    Dental   Pulmonary neg pulmonary ROS,          Cardiovascular hypertension, + CAD and + CABG + Valvular Problems/Murmurs     Neuro/Psych PSYCHIATRIC DISORDERS  Neuromuscular disease    GI/Hepatic negative GI ROS, Neg liver ROS, hiatal hernia, GERD-  Medicated and Controlled,  Endo/Other  Negative Endocrine ROS  Renal/GU negative Renal ROS  Genitourinary negative   Musculoskeletal   Abdominal   Peds  Hematology negative hematology ROS (+)   Anesthesia Other Findings See surgeon's H&P   Reproductive/Obstetrics negative OB ROS                           Anesthesia Physical Anesthesia Plan  ASA: III  Anesthesia Plan: General   Post-op Pain Management:    Induction:   Airway Management Planned: LMA  Additional Equipment:   Intra-op Plan:   Post-operative Plan: Extubation in OR  Informed Consent: I have reviewed the patients History and Physical, chart, labs and discussed the procedure including the risks, benefits and alternatives for the proposed anesthesia with the patient or authorized representative who has indicated his/her understanding and acceptance.     Plan Discussed with: CRNA and Surgeon  Anesthesia Plan Comments:         Anesthesia Quick Evaluation

## 2011-07-10 NOTE — Op Note (Signed)
NAMEANASTACIO, BUA NO.:  [id number]  MEDICAL RECORD NO.:  [id number]  LOCATION:  WTR9                         FACILITY:  [facility]  PHYSICIAN:  Betha Loa, MD        DATE OF BIRTH:  15-Aug-1935  DATE OF PROCEDURE:  07/09/2011 DATE OF DISCHARGE:  07/07/2011                              OPERATIVE REPORT   PREOPERATIVE DIAGNOSIS:  Left long finger infection.  POSTOP DIAGNOSIS:  Left long finger infection.  PROCEDURE:  Irrigation and debridement of left long finger abscess.  SURGEON:  Betha Loa, MD  ASSISTANTS:  None.  ANESTHESIA:  General.  IV FLUIDS:  Per anesthesia flow sheet.  ESTIMATED BLOOD LOSS:  Minimal.  COMPLICATIONS:  None.  SPECIMENS:  Aerobic and anaerobic cultures from left long finger.  TOURNIQUET TIME:  26 minutes.  DISPOSITION:  Stable to PACU.  INDICATIONS:  Mr. Legate is a 76 year old left-hand dominant male who approximately 4 days ago began to notice redness and swelling of his left long finger.  He presented to the emergency department 2 days ago where irrigation and debridement was performed.  He followed up with his primary care physician yesterday and was noted to have continued swelling and erythema.  He followed up in the office this morning where he continued to have swelling and erythema in the long finger.  There was purulence coming from the small irrigation debridement wound.  It was felt that it would be appropriate to formally irrigate and debride the wound in the operating room.  Risks, benefits, and alternatives of surgery were discussed including the risk of blood loss, infection, damage to nerves, vessels, tendons, ligaments, bone, failure to surgery, need for additional surgery, complications with wound healing, continued pain, continued infection, need for repeat irrigation and debridement. He voiced understanding of these risks and elected to proceed.  OPERATIVE COURSE:  After being identified preoperatively  by myself, the patient and I agreed upon procedure site and procedure.  The surgical site was marked.  The risks, benefits, and alternatives of surgery were reviewed and he wished to proceed.  Surgical consent was signed.  He was transported to the operating room and placed on the operating table in supine position with left upper extremity on arm board.  General anesthesia was induced by the anesthesiologist.  Left upper extremity was prepped and draped in normal sterile orthopedic fashion.  Surgical pause was performed between surgeons, anesthesia, and operating staff and all were in agreement with the patient, procedure, and the site of procedure.  Tourniquet in proximal aspect of the extremity was inflated to 250 mmHg after gravity exsanguination of the hand and Esmarch exsanguination of the forearm.  Incision was made on the volar aspect of the finger including the previous wound.  There was a small amount of gross purulence within it.  There was some necrotic fat as well.  This was all debrided.  The flexor tendon sheath was identified and was not bulging.  A small rent was made and there was no purulence or purulence from within the sheath.  Prior to prepping and draping, it was noted that he was extremely tender on the dorsum of  the finger where there was some erythema as well.  This area was incised.  It was on the dorsoulnar side of the long finger over the proximal phalanx.  There was cloudy fluid within this as well.  There was some necrotic fat as well.  This was all debrided.  The wounds were copiously irrigated with 1000 mL of sterile saline.  They were then packed with quarter-inch iodoform gauze. A 6 mL of 0.25% plain Marcaine were used to aid in postoperative analgesia.  The wound was dressed with sterile Xeroform and 4x4s and wrapped with a Kling and Coban dressing.  Tourniquet was deflated at 26 minutes.  The fingertips were pink with brisk capillary refill  after deflation of the tourniquet.  The operative drapes were broken down. The patient was awoken from anesthesia safely.  He was transferred back to stretcher and taken to PACU in stable condition.  He will be continued on doxycycline 100 mg p.o. b.i.d.  Also given Percocet 5/325, 1-2 p.o. q.6 hours p.r.n. pain, dispensed #40.  We will have him take his packing out in 2-3 days and do saline soaks.  I will see him back in the office on Monday.     Betha Loa, MD     KK/MEDQ  D:  07/09/2011  T:  07/10/2011  Job:  [id number]

## 2011-07-12 LAB — CULTURE, ROUTINE-ABSCESS

## 2011-07-13 ENCOUNTER — Encounter (HOSPITAL_BASED_OUTPATIENT_CLINIC_OR_DEPARTMENT_OTHER): Payer: Self-pay | Admitting: Orthopedic Surgery

## 2011-07-13 DIAGNOSIS — S61409A Unspecified open wound of unspecified hand, initial encounter: Secondary | ICD-10-CM | POA: Diagnosis not present

## 2011-07-13 DIAGNOSIS — L03019 Cellulitis of unspecified finger: Secondary | ICD-10-CM | POA: Diagnosis not present

## 2011-07-14 LAB — ANAEROBIC CULTURE

## 2011-07-15 DIAGNOSIS — L02519 Cutaneous abscess of unspecified hand: Secondary | ICD-10-CM | POA: Diagnosis not present

## 2011-07-15 DIAGNOSIS — S61409A Unspecified open wound of unspecified hand, initial encounter: Secondary | ICD-10-CM | POA: Diagnosis not present

## 2011-07-15 DIAGNOSIS — L03019 Cellulitis of unspecified finger: Secondary | ICD-10-CM | POA: Diagnosis not present

## 2011-07-17 DIAGNOSIS — S61409A Unspecified open wound of unspecified hand, initial encounter: Secondary | ICD-10-CM | POA: Diagnosis not present

## 2011-07-17 DIAGNOSIS — L03019 Cellulitis of unspecified finger: Secondary | ICD-10-CM | POA: Diagnosis not present

## 2011-07-17 DIAGNOSIS — L02519 Cutaneous abscess of unspecified hand: Secondary | ICD-10-CM | POA: Diagnosis not present

## 2011-07-20 DIAGNOSIS — M19049 Primary osteoarthritis, unspecified hand: Secondary | ICD-10-CM | POA: Diagnosis not present

## 2011-07-20 DIAGNOSIS — S61409A Unspecified open wound of unspecified hand, initial encounter: Secondary | ICD-10-CM | POA: Diagnosis not present

## 2011-07-20 DIAGNOSIS — L02519 Cutaneous abscess of unspecified hand: Secondary | ICD-10-CM | POA: Diagnosis not present

## 2011-07-30 ENCOUNTER — Ambulatory Visit (INDEPENDENT_AMBULATORY_CARE_PROVIDER_SITE_OTHER): Payer: Medicare Other

## 2011-07-30 DIAGNOSIS — Z7901 Long term (current) use of anticoagulants: Secondary | ICD-10-CM | POA: Diagnosis not present

## 2011-07-30 DIAGNOSIS — I82409 Acute embolism and thrombosis of unspecified deep veins of unspecified lower extremity: Secondary | ICD-10-CM

## 2011-07-30 DIAGNOSIS — I2699 Other pulmonary embolism without acute cor pulmonale: Secondary | ICD-10-CM | POA: Diagnosis not present

## 2011-07-30 LAB — POCT INR: INR: 3.1

## 2011-08-27 ENCOUNTER — Ambulatory Visit (INDEPENDENT_AMBULATORY_CARE_PROVIDER_SITE_OTHER): Payer: Medicare Other

## 2011-08-27 DIAGNOSIS — Z7901 Long term (current) use of anticoagulants: Secondary | ICD-10-CM | POA: Diagnosis not present

## 2011-08-27 DIAGNOSIS — I82409 Acute embolism and thrombosis of unspecified deep veins of unspecified lower extremity: Secondary | ICD-10-CM

## 2011-08-27 DIAGNOSIS — I2699 Other pulmonary embolism without acute cor pulmonale: Secondary | ICD-10-CM | POA: Diagnosis not present

## 2011-09-07 ENCOUNTER — Encounter: Payer: Self-pay | Admitting: Pulmonary Disease

## 2011-09-07 ENCOUNTER — Ambulatory Visit (INDEPENDENT_AMBULATORY_CARE_PROVIDER_SITE_OTHER)
Admission: RE | Admit: 2011-09-07 | Discharge: 2011-09-07 | Disposition: A | Discharge status: Home / Self Care | Payer: Medicare Other | Source: Ambulatory Visit | Attending: Pulmonary Disease | Admitting: Pulmonary Disease

## 2011-09-07 ENCOUNTER — Ambulatory Visit (INDEPENDENT_AMBULATORY_CARE_PROVIDER_SITE_OTHER): Payer: Medicare Other | Admitting: Pulmonary Disease

## 2011-09-07 DIAGNOSIS — K219 Gastro-esophageal reflux disease without esophagitis: Secondary | ICD-10-CM

## 2011-09-07 DIAGNOSIS — I059 Rheumatic mitral valve disease, unspecified: Secondary | ICD-10-CM

## 2011-09-07 DIAGNOSIS — C61 Malignant neoplasm of prostate: Secondary | ICD-10-CM

## 2011-09-07 DIAGNOSIS — M1990 Unspecified osteoarthritis, unspecified site: Secondary | ICD-10-CM

## 2011-09-07 DIAGNOSIS — K635 Polyp of colon: Secondary | ICD-10-CM | POA: Insufficient documentation

## 2011-09-07 DIAGNOSIS — D126 Benign neoplasm of colon, unspecified: Secondary | ICD-10-CM

## 2011-09-07 DIAGNOSIS — I25810 Atherosclerosis of coronary artery bypass graft(s) without angina pectoris: Secondary | ICD-10-CM

## 2011-09-07 DIAGNOSIS — I10 Essential (primary) hypertension: Secondary | ICD-10-CM

## 2011-09-07 DIAGNOSIS — I6529 Occlusion and stenosis of unspecified carotid artery: Secondary | ICD-10-CM

## 2011-09-07 DIAGNOSIS — E785 Hyperlipidemia, unspecified: Secondary | ICD-10-CM

## 2011-09-07 DIAGNOSIS — I4891 Unspecified atrial fibrillation: Secondary | ICD-10-CM

## 2011-09-07 MED ORDER — METOPROLOL TARTRATE 50 MG PO TABS: 50.0000 mg | ORAL_TABLET | Freq: Two times a day (BID) | ORAL | Status: DC
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
## 2011-09-07 NOTE — Patient Instructions (Signed)
Today we updated your med list in our EPIC system...    We decided to change your Metoprolol Succinate to the Tartrate to save $$$    But the new Metoprolol Tartrate needs to be 50mg  twice daily...  Today we did your follow up CXR...    Please call the PHONE TREE in a few days for your results...    Dial [id number] & when prompted enter your patient number followed by the # symbol...    Your patient number is:  [id number]#  The skin lesions on your back look like seborrheic keratoses (benign) & they can be removed by the Dermatologist in his/her office at your discretion.   Call for any problems...  Let's continue our 6 month follow up visits.Marland KitchenMarland Kitchen

## 2011-09-23 DIAGNOSIS — H6120 Impacted cerumen, unspecified ear: Secondary | ICD-10-CM | POA: Diagnosis not present

## 2011-09-23 DIAGNOSIS — H905 Unspecified sensorineural hearing loss: Secondary | ICD-10-CM | POA: Diagnosis not present

## 2011-09-24 ENCOUNTER — Ambulatory Visit (INDEPENDENT_AMBULATORY_CARE_PROVIDER_SITE_OTHER): Payer: Medicare Other | Admitting: *Deleted

## 2011-09-24 DIAGNOSIS — I2699 Other pulmonary embolism without acute cor pulmonale: Secondary | ICD-10-CM | POA: Diagnosis not present

## 2011-09-24 DIAGNOSIS — I82409 Acute embolism and thrombosis of unspecified deep veins of unspecified lower extremity: Secondary | ICD-10-CM

## 2011-09-24 DIAGNOSIS — Z7901 Long term (current) use of anticoagulants: Secondary | ICD-10-CM

## 2011-10-30 ENCOUNTER — Encounter: Payer: Self-pay | Admitting: Adult Health

## 2011-10-30 ENCOUNTER — Telehealth: Payer: Self-pay | Admitting: Pulmonary Disease

## 2011-10-30 ENCOUNTER — Ambulatory Visit (INDEPENDENT_AMBULATORY_CARE_PROVIDER_SITE_OTHER)
Admission: RE | Admit: 2011-10-30 | Discharge: 2011-10-30 | Disposition: A | Discharge status: Home / Self Care | Payer: Medicare Other | Source: Ambulatory Visit | Attending: Adult Health | Admitting: Adult Health

## 2011-10-30 ENCOUNTER — Ambulatory Visit (INDEPENDENT_AMBULATORY_CARE_PROVIDER_SITE_OTHER): Payer: Medicare Other | Admitting: Adult Health

## 2011-10-30 ENCOUNTER — Other Ambulatory Visit (INDEPENDENT_AMBULATORY_CARE_PROVIDER_SITE_OTHER): Payer: Medicare Other

## 2011-10-30 VITALS — BP 158/70 | HR 67 | Temp 97.1°F | Ht 69.0 in | Wt 187.2 lb

## 2011-10-30 DIAGNOSIS — R042 Hemoptysis: Secondary | ICD-10-CM | POA: Diagnosis not present

## 2011-10-30 DIAGNOSIS — Z86711 Personal history of pulmonary embolism: Secondary | ICD-10-CM

## 2011-10-30 DIAGNOSIS — J209 Acute bronchitis, unspecified: Secondary | ICD-10-CM

## 2011-10-30 DIAGNOSIS — I2699 Other pulmonary embolism without acute cor pulmonale: Secondary | ICD-10-CM | POA: Diagnosis not present

## 2011-10-30 DIAGNOSIS — I2789 Other specified pulmonary heart diseases: Secondary | ICD-10-CM | POA: Diagnosis not present

## 2011-10-30 LAB — PROTIME-INR: INR: 2.8 ratio — ABNORMAL HIGH (ref 0.8–1.0)

## 2011-10-30 MED ORDER — HYDROCODONE-HOMATROPINE 5-1.5 MG/5ML PO SYRP: 5.0000 mL | ORAL_SOLUTION | Freq: Four times a day (QID) | ORAL | Status: AC | PRN

## 2011-10-30 MED ORDER — AZITHROMYCIN 250 MG PO TABS: ORAL_TABLET | ORAL | Status: AC

## 2011-10-30 NOTE — Assessment & Plan Note (Signed)
URI/Bronchitis - suspect blood tinged mucus is secondary to coumadin/asa  and severe cough  CXR with NAD  INR therapeutic 2.8   Plan;  Zpack take as directed  Mucinex DM Twice daily  As needed  Cough/congestion  Fluids and rest  Hydromet 1/2-1 tsp every 6 hr As needed  Cough  follow up coumadin clinic next week.  Hold aspirin x 2 days then restart.  Please contact office for sooner follow up if symptoms do not improve or worsen or seek emergency care  Follow up Dr. Kriste Basque  As planned and As needed

## 2011-10-30 NOTE — Telephone Encounter (Signed)
Spoke with pt. He c/o prod cough x 2 days with pink to red "clots" of blood. Also having some tightness in his chest. OV with TP at 2 pm- advised arrive a few minutes sooner for stat INR and CXR. Orders already placed.

## 2011-10-30 NOTE — Patient Instructions (Addendum)
Zpack take as directed  Mucinex DM Twice daily  As needed  Cough/congestion  Fluids and rest  Hydromet 1/2-1 tsp every 6 hr As needed  Cough  follow up coumadin clinic next week.  Hold aspirin x 2 days then restart.  Please contact office for sooner follow up if symptoms do not improve or worsen or seek emergency care  Follow up Dr. Kriste Basque  As planned and As needed
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
## 2011-11-05 ENCOUNTER — Ambulatory Visit (INDEPENDENT_AMBULATORY_CARE_PROVIDER_SITE_OTHER): Payer: Medicare Other | Admitting: *Deleted

## 2011-11-05 DIAGNOSIS — Z7901 Long term (current) use of anticoagulants: Secondary | ICD-10-CM | POA: Diagnosis not present

## 2011-11-05 DIAGNOSIS — I2699 Other pulmonary embolism without acute cor pulmonale: Secondary | ICD-10-CM

## 2011-11-05 DIAGNOSIS — I82409 Acute embolism and thrombosis of unspecified deep veins of unspecified lower extremity: Secondary | ICD-10-CM | POA: Diagnosis not present

## 2011-12-17 ENCOUNTER — Ambulatory Visit (INDEPENDENT_AMBULATORY_CARE_PROVIDER_SITE_OTHER): Payer: Medicare Other | Admitting: *Deleted

## 2011-12-17 DIAGNOSIS — I2699 Other pulmonary embolism without acute cor pulmonale: Secondary | ICD-10-CM | POA: Diagnosis not present

## 2011-12-17 DIAGNOSIS — Z7901 Long term (current) use of anticoagulants: Secondary | ICD-10-CM | POA: Diagnosis not present

## 2011-12-17 DIAGNOSIS — I82409 Acute embolism and thrombosis of unspecified deep veins of unspecified lower extremity: Secondary | ICD-10-CM | POA: Diagnosis not present

## 2011-12-18 ENCOUNTER — Other Ambulatory Visit (HOSPITAL_COMMUNITY): Payer: Self-pay

## 2011-12-18 MED ORDER — AMLODIPINE BESYLATE 10 MG PO TABS: 5.0000 mg | ORAL_TABLET | Freq: Every day | ORAL | Status: DC

## 2011-12-18 MED ORDER — DOXAZOSIN MESYLATE 8 MG PO TABS: 8.0000 mg | ORAL_TABLET | Freq: Every day | ORAL | Status: DC

## 2011-12-18 MED ORDER — LOSARTAN POTASSIUM 100 MG PO TABS: 100.0000 mg | ORAL_TABLET | Freq: Every day | ORAL | Status: DC

## 2011-12-18 NOTE — Telephone Encounter (Signed)
..   Requested Prescriptions   Signed Prescriptions Disp Refills  . losartan (COZAAR) 100 MG tablet 90 tablet 1    Sig: Take 1 tablet (100 mg total) by mouth daily.    Authorizing Provider: Lewayne Bunting    Ordering User: Christella Hartigan, Rakesha Dalporto Judie Petit

## 2011-12-18 NOTE — Telephone Encounter (Signed)
..   Requested Prescriptions   Signed Prescriptions Disp Refills  . doxazosin (CARDURA) 8 MG tablet 90 tablet 1    Sig: Take 1 tablet (8 mg total) by mouth at bedtime.    Authorizing Provider: Lewayne Bunting    Ordering User: Christella Hartigan, Breyana Follansbee Judie Petit

## 2011-12-18 NOTE — Telephone Encounter (Signed)
..   Requested Prescriptions   Signed Prescriptions Disp Refills  . amLODipine (NORVASC) 10 MG tablet 90 tablet 0    Sig: Take 0.5 tablets (5 mg total) by mouth daily.    Authorizing Provider: Lewayne Bunting    Ordering User: Christella Hartigan, Yerania Chamorro Judie Petit

## 2012-01-28 ENCOUNTER — Ambulatory Visit (INDEPENDENT_AMBULATORY_CARE_PROVIDER_SITE_OTHER): Payer: Medicare Other | Admitting: Pharmacist

## 2012-01-28 DIAGNOSIS — I82409 Acute embolism and thrombosis of unspecified deep veins of unspecified lower extremity: Secondary | ICD-10-CM | POA: Diagnosis not present

## 2012-01-28 DIAGNOSIS — Z7901 Long term (current) use of anticoagulants: Secondary | ICD-10-CM

## 2012-01-28 DIAGNOSIS — I2699 Other pulmonary embolism without acute cor pulmonale: Secondary | ICD-10-CM

## 2012-02-25 ENCOUNTER — Ambulatory Visit (INDEPENDENT_AMBULATORY_CARE_PROVIDER_SITE_OTHER): Payer: Medicare Other | Admitting: Pharmacist

## 2012-02-25 DIAGNOSIS — Z7901 Long term (current) use of anticoagulants: Secondary | ICD-10-CM | POA: Diagnosis not present

## 2012-02-25 DIAGNOSIS — I2699 Other pulmonary embolism without acute cor pulmonale: Secondary | ICD-10-CM

## 2012-02-25 DIAGNOSIS — I82409 Acute embolism and thrombosis of unspecified deep veins of unspecified lower extremity: Secondary | ICD-10-CM

## 2012-03-13 ENCOUNTER — Other Ambulatory Visit: Payer: Self-pay | Admitting: Cardiology

## 2012-03-14 ENCOUNTER — Encounter: Payer: Self-pay | Admitting: Pulmonary Disease

## 2012-03-14 ENCOUNTER — Ambulatory Visit (INDEPENDENT_AMBULATORY_CARE_PROVIDER_SITE_OTHER): Payer: Medicare Other | Admitting: Pulmonary Disease

## 2012-03-14 VITALS — BP 122/60 | HR 57 | Temp 97.8°F | Ht 69.0 in | Wt 184.6 lb

## 2012-03-14 DIAGNOSIS — I82409 Acute embolism and thrombosis of unspecified deep veins of unspecified lower extremity: Secondary | ICD-10-CM | POA: Diagnosis not present

## 2012-03-14 DIAGNOSIS — I10 Essential (primary) hypertension: Secondary | ICD-10-CM

## 2012-03-14 DIAGNOSIS — K219 Gastro-esophageal reflux disease without esophagitis: Secondary | ICD-10-CM

## 2012-03-14 DIAGNOSIS — C61 Malignant neoplasm of prostate: Secondary | ICD-10-CM

## 2012-03-14 DIAGNOSIS — E785 Hyperlipidemia, unspecified: Secondary | ICD-10-CM

## 2012-03-14 DIAGNOSIS — I25810 Atherosclerosis of coronary artery bypass graft(s) without angina pectoris: Secondary | ICD-10-CM | POA: Diagnosis not present

## 2012-03-14 DIAGNOSIS — Z23 Encounter for immunization: Secondary | ICD-10-CM | POA: Diagnosis not present

## 2012-03-14 DIAGNOSIS — R1310 Dysphagia, unspecified: Secondary | ICD-10-CM | POA: Diagnosis not present

## 2012-03-14 DIAGNOSIS — I2699 Other pulmonary embolism without acute cor pulmonale: Secondary | ICD-10-CM

## 2012-03-14 DIAGNOSIS — M1990 Unspecified osteoarthritis, unspecified site: Secondary | ICD-10-CM

## 2012-03-14 DIAGNOSIS — I6529 Occlusion and stenosis of unspecified carotid artery: Secondary | ICD-10-CM

## 2012-03-14 DIAGNOSIS — I059 Rheumatic mitral valve disease, unspecified: Secondary | ICD-10-CM

## 2012-03-14 MED ORDER — PANTOPRAZOLE SODIUM 40 MG PO TBEC: 40.0000 mg | DELAYED_RELEASE_TABLET | Freq: Every day | ORAL | Status: DC
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
## 2012-03-14 NOTE — Patient Instructions (Addendum)
Today we updated your med list in our EPIC system...    Continue your current medications the same...  For your swallowing difficulty>    Start the PROTONIX 40mg  taken 30 min before the 1st meal of the day...    We will arrange for an appt w/ DrStark for further evaluation & probable Endoscopy...  Please return to our lab one morning this week for your FASTING blood work...    We will call you w/ the results...  We will also send a note to [facility] regarding your overdue cardiac follow up & it's time for your Carotid doppler eval...  We gave you the 2013 Flu vaccine today...  Call for any questions...  Let's plan another follow up in 6 months.John [name]

## 2012-03-17 ENCOUNTER — Ambulatory Visit (INDEPENDENT_AMBULATORY_CARE_PROVIDER_SITE_OTHER): Payer: Medicare Other | Admitting: *Deleted

## 2012-03-17 ENCOUNTER — Other Ambulatory Visit (INDEPENDENT_AMBULATORY_CARE_PROVIDER_SITE_OTHER): Payer: Medicare Other

## 2012-03-17 DIAGNOSIS — C61 Malignant neoplasm of prostate: Secondary | ICD-10-CM | POA: Diagnosis not present

## 2012-03-17 DIAGNOSIS — I82409 Acute embolism and thrombosis of unspecified deep veins of unspecified lower extremity: Secondary | ICD-10-CM | POA: Diagnosis not present

## 2012-03-17 DIAGNOSIS — I2699 Other pulmonary embolism without acute cor pulmonale: Secondary | ICD-10-CM | POA: Diagnosis not present

## 2012-03-17 DIAGNOSIS — R1310 Dysphagia, unspecified: Secondary | ICD-10-CM

## 2012-03-17 DIAGNOSIS — E785 Hyperlipidemia, unspecified: Secondary | ICD-10-CM | POA: Diagnosis not present

## 2012-03-17 DIAGNOSIS — Z7901 Long term (current) use of anticoagulants: Secondary | ICD-10-CM | POA: Diagnosis not present

## 2012-03-17 LAB — LIPID PANEL
Cholesterol: 189 mg/dL (ref 0–200)
LDL Cholesterol: 106 mg/dL — ABNORMAL HIGH (ref 0–99)
VLDL: 20.8 mg/dL (ref 0.0–40.0)

## 2012-03-17 LAB — BASIC METABOLIC PANEL
BUN: 19 mg/dL (ref 6–23)
Chloride: 113 mEq/L — ABNORMAL HIGH (ref 96–112)
GFR: 71.06 mL/min (ref >60.00)
Glucose, Bld: 111 mg/dL — ABNORMAL HIGH (ref 70–99)
Potassium: 5.1 mEq/L (ref 3.5–5.1)
Sodium: 146 mEq/L — ABNORMAL HIGH (ref 135–145)

## 2012-03-17 LAB — CBC WITH DIFFERENTIAL/PLATELET
Basophils Absolute: 0 10*3/uL (ref 0.0–0.1)
Eosinophils Absolute: 0.1 10*3/uL (ref 0.0–0.7)
HCT: 41.5 % (ref 39.0–52.0)
Lymphs Abs: 1.2 10*3/uL (ref 0.7–4.0)
MCV: 85.4 fl (ref 78.0–100.0)
Monocytes Absolute: 0.6 10*3/uL (ref 0.1–1.0)
Neutrophils Relative %: 80.4 % — ABNORMAL HIGH (ref 43.0–77.0)
Platelets: 142 10*3/uL — ABNORMAL LOW (ref 150.0–400.0)
RDW: 14.4 % (ref 11.5–14.6)
WBC: 9.7 10*3/uL (ref 4.5–10.5)

## 2012-03-17 LAB — POCT INR: INR: 3.2

## 2012-03-17 LAB — PSA: PSA: 0.06 ng/mL — ABNORMAL LOW (ref 0.10–4.00)

## 2012-03-17 LAB — HEPATIC FUNCTION PANEL
ALT: 27 U/L (ref 0–53)
AST: 28 U/L (ref 0–37)
Alkaline Phosphatase: 97 U/L (ref 39–117)
Total Bilirubin: 0.4 mg/dL (ref 0.3–1.2)

## 2012-03-17 LAB — TSH: TSH: 2.17 u[IU]/mL (ref 0.35–5.50)

## 2012-04-01 ENCOUNTER — Ambulatory Visit: Payer: Medicare Other | Admitting: Cardiology

## 2012-04-04 ENCOUNTER — Ambulatory Visit (INDEPENDENT_AMBULATORY_CARE_PROVIDER_SITE_OTHER): Payer: Medicare Other | Admitting: Gastroenterology

## 2012-04-04 ENCOUNTER — Encounter: Payer: Self-pay | Admitting: Gastroenterology

## 2012-04-04 VITALS — BP 134/74 | HR 62 | Ht 69.0 in | Wt 187.0 lb

## 2012-04-04 DIAGNOSIS — Z86010 Personal history of colonic polyps: Secondary | ICD-10-CM

## 2012-04-04 DIAGNOSIS — K219 Gastro-esophageal reflux disease without esophagitis: Secondary | ICD-10-CM

## 2012-04-04 DIAGNOSIS — R1319 Other dysphagia: Secondary | ICD-10-CM

## 2012-04-04 DIAGNOSIS — R0989 Other specified symptoms and signs involving the circulatory and respiratory systems: Secondary | ICD-10-CM

## 2012-04-04 DIAGNOSIS — F458 Other somatoform disorders: Secondary | ICD-10-CM | POA: Diagnosis not present

## 2012-04-04 NOTE — Progress Notes (Signed)
History of Present Illness: This is a 76 year old male relates a sensation of a "lump in his throat". He also notes coughing and difficulty swallowing solid foods and localizes the symptoms to his neck. He has noted hoarseness and he attributes this to singing. He has had heartburn in the past and previously underwent upper endoscopy in 1998 showing mild esophagitis and a small hiatal hernia. After a recent visit with Dr. Kriste Basque he was started on pantoprazole 40 mg daily and his heartburn has resolved however all other symptoms persist. He has a history of a pulmonary embolism and is maintained on Coumadin. He relates that his daughter recently suffered a pulmonary embolism and she is currently hospitalized in [place], [place]. Denies weight loss, abdominal pain, constipation, diarrhea, change in stool caliber, melena, hematochezia, nausea, vomiting, chest pain.  Current Medications, Allergies, Past Medical History, Past Surgical History, Family History and Social History were reviewed in [employer] record.  Physical Exam: General: Well developed , well nourished, no acute distress Head: Normocephalic and atraumatic Eyes:  sclerae anicteric, EOMI Ears: Normal auditory acuity Mouth: No deformity or lesions Lungs: Clear throughout to auscultation Heart: Regular rate and rhythm; no murmurs, rubs or bruits Abdomen: Soft, non tender and non distended. No masses, hepatosplenomegaly or hernias noted. Normal Bowel sounds Musculoskeletal: Symmetrical with no gross deformities  Pulses:  Normal pulses noted Extremities: No clubbing, cyanosis, edema or deformities noted Neurological: Alert oriented x 4, grossly nonfocal Psychological:  Alert and cooperative. Normal mood and affect  Assessment and Recommendations:  1. Globus sensation, dysphasia with coughing, reflux symptoms. Rule out esophageal strictures, esophagitis and ENT disorders. Schedule barium esophagram and upper endoscopy  off Coumadin. The risks, benefits and alternatives to a 3-5 day hold Coumadin discussed with the patient and he consents to proceed. Will obtain clearance from Dr. Kriste Basque. The risks, benefits, and alternatives to endoscopy with possible biopsy and possible dilation were discussed with the patient and they consent to proceed.   2. Personal history of adenomatous colon polyps. Last colonoscopy November 2012, surveillance colonoscopy November 2017.

## 2012-04-04 NOTE — Patient Instructions (Addendum)
You have been scheduled for a Barium Esophogram at [facility] Radiology (1st floor of the hospital) on 04-06-2012 at 10:30 am. Please arrive 15 minutes prior to your appointment for registration. Make certain not to have anything to eat or drink 6 hours prior to your test. If you need to reschedule for any reason, please contact radiology at [phone] to do so.  You have been scheduled for an endoscopy with propofol. Please follow written instructions given to you at your visit today. If you use inhalers (even only as needed), please bring them with you on the day of your procedure.

## 2012-04-06 ENCOUNTER — Ambulatory Visit (HOSPITAL_COMMUNITY)
Admission: RE | Admit: 2012-04-06 | Discharge: 2012-04-06 | Disposition: A | Discharge status: Home / Self Care | Payer: Medicare Other | Source: Ambulatory Visit | Attending: Gastroenterology | Admitting: Gastroenterology

## 2012-04-06 ENCOUNTER — Other Ambulatory Visit: Payer: Self-pay

## 2012-04-06 DIAGNOSIS — K225 Diverticulum of esophagus, acquired: Secondary | ICD-10-CM | POA: Insufficient documentation

## 2012-04-06 DIAGNOSIS — K219 Gastro-esophageal reflux disease without esophagitis: Secondary | ICD-10-CM | POA: Diagnosis not present

## 2012-04-06 DIAGNOSIS — Z860100 Personal history of colon polyps, unspecified: Secondary | ICD-10-CM | POA: Insufficient documentation

## 2012-04-06 DIAGNOSIS — R0989 Other specified symptoms and signs involving the circulatory and respiratory systems: Secondary | ICD-10-CM

## 2012-04-06 DIAGNOSIS — K449 Diaphragmatic hernia without obstruction or gangrene: Secondary | ICD-10-CM | POA: Insufficient documentation

## 2012-04-07 ENCOUNTER — Ambulatory Visit (INDEPENDENT_AMBULATORY_CARE_PROVIDER_SITE_OTHER): Payer: Medicare Other | Admitting: *Deleted

## 2012-04-07 DIAGNOSIS — I82409 Acute embolism and thrombosis of unspecified deep veins of unspecified lower extremity: Secondary | ICD-10-CM | POA: Diagnosis not present

## 2012-04-07 DIAGNOSIS — Z7901 Long term (current) use of anticoagulants: Secondary | ICD-10-CM

## 2012-04-07 DIAGNOSIS — I2699 Other pulmonary embolism without acute cor pulmonale: Secondary | ICD-10-CM

## 2012-04-20 DIAGNOSIS — K225 Diverticulum of esophagus, acquired: Secondary | ICD-10-CM | POA: Diagnosis not present

## 2012-04-20 DIAGNOSIS — R1314 Dysphagia, pharyngoesophageal phase: Secondary | ICD-10-CM | POA: Diagnosis not present

## 2012-04-21 ENCOUNTER — Encounter: Payer: Self-pay | Admitting: Cardiology

## 2012-04-21 ENCOUNTER — Ambulatory Visit (INDEPENDENT_AMBULATORY_CARE_PROVIDER_SITE_OTHER): Payer: Medicare Other | Admitting: Cardiology

## 2012-04-21 VITALS — BP 147/63 | HR 52 | Wt 187.0 lb

## 2012-04-21 DIAGNOSIS — Z9889 Other specified postprocedural states: Secondary | ICD-10-CM

## 2012-04-21 DIAGNOSIS — I25810 Atherosclerosis of coronary artery bypass graft(s) without angina pectoris: Secondary | ICD-10-CM

## 2012-04-21 DIAGNOSIS — I2510 Atherosclerotic heart disease of native coronary artery without angina pectoris: Secondary | ICD-10-CM

## 2012-04-21 DIAGNOSIS — I10 Essential (primary) hypertension: Secondary | ICD-10-CM

## 2012-04-21 DIAGNOSIS — I6529 Occlusion and stenosis of unspecified carotid artery: Secondary | ICD-10-CM | POA: Diagnosis not present

## 2012-04-21 NOTE — Assessment & Plan Note (Signed)
Discontinue aspirin given need for Coumadin. Continue statin. Schedule followup carotid Dopplers.

## 2012-04-21 NOTE — Assessment & Plan Note (Signed)
Blood pressure controlled. Continue present medications. Potassium and renal function followed by primary care. 

## 2012-04-21 NOTE — Patient Instructions (Addendum)
Your physician wants you to follow-up in: ONE YEAR WITH DR Shelda Pal will receive a reminder letter in the mail two months in advance. If you don't receive a letter, please call our office to schedule the follow-up appointment.   Your physician has requested that you have a carotid duplex. This test is an ultrasound of the carotid arteries in your neck. It looks at blood flow through these arteries that supply the brain with blood. Allow one hour for this exam. There are no restrictions or special instructions.   Your physician has requested that you have an echocardiogram. Echocardiography is a painless test that uses sound waves to create images of your heart. It provides your doctor with information about the size and shape of your heart and how well your heart's chambers and valves are working. This procedure takes approximately one hour. There are no restrictions for this procedure.   STOP ASPIRIN

## 2012-04-21 NOTE — Progress Notes (Signed)
HPI: [name] is a pleasant gentleman who has a history of coronary artery disease status post coronary bypassing graft, as well as mitral valve repair in January 2009. He had an echocardiogram performed in June of 2011 that showed normal LV function. There was a mitral annuloplasty ring with mild MR and MS; mild LAE. The patient had an Myoview performed on July 12, 2008. This was normal with an ejection fraction of 66%. Last carotid Dopplers performed in October 2012 showed 40-59% bilateral stenosis; fu recommended in one year. LDL in Sept 2013 106. I last saw him in Sept 2012. Since then the patient denies any dyspnea on exertion, orthopnea, PND, pedal edema, palpitations, syncope or chest pain.   Current Outpatient Prescriptions  Medication Sig Dispense Refill  . amLODipine (NORVASC) 10 MG tablet Take 0.5 tablets (5 mg total) by mouth daily.  90 tablet  0  . aspirin 81 MG tablet Take 81 mg by mouth daily.        Marland Kitchen doxazosin (CARDURA) 8 MG tablet Take 1 tablet (8 mg total) by mouth at bedtime.  90 tablet  1  . losartan (COZAAR) 100 MG tablet Take 1 tablet (100 mg total) by mouth daily.  90 tablet  1  . metoprolol (LOPRESSOR) 50 MG tablet Take 1 tablet (50 mg total) by mouth 2 (two) times daily.  180 tablet  3  . pantoprazole (PROTONIX) 40 MG tablet Take 1 tablet (40 mg total) by mouth daily.  30 tablet  6  . pravastatin (PRAVACHOL) 40 MG tablet TAKE 1 TABLET BY MOUTH EVERY EVENING  90 tablet  3  . warfarin (COUMADIN) 2.5 MG tablet Take 1.25-2.5 mg by mouth daily. Alternating with 0.5 tablet and 1 tablet every other day.          Past Medical History  Diagnosis Date  . PE (pulmonary embolism)   . Deep vein thrombophlebitis of leg   . Essential hypertension, benign   . CAD (coronary artery disease)     s/p CABG  . Carotid stenosis   . Hyperlipidemia   . GERD (gastroesophageal reflux disease)   . Hemorrhoids   . Prostate cancer   . DJD (degenerative joint disease)   . Sebaceous  cyst     neck  . Seborrheic dermatitis   . Anxiety   . Hiatal hernia   . Tubular adenoma of colon 05/2011    Past Surgical History  Procedure Date  . Tandem and ovoid insertion   . Vasectomy   . Prostate cancer surgery 11/1996    radial retropubic prostatectomy  . Coronary artery bypass graft     x2  . Mitral valve repair   . Bilateral shoulder surgery   . Left knee arthroscopy   . I&d extremity 07/09/2011    Procedure: IRRIGATION AND DEBRIDEMENT EXTREMITY;  Surgeon: Tami Ribas;  Location: [facility];  Service: Orthopedics;  Laterality: Left;    History   Social History  . Marital Status: Married    Spouse Name: John [name]    Number of Children: 3  . Years of Education: N/A   Occupational History  . retired    Social History Main Topics  . Smoking status: Former Smoker -- 1.0 packs/day for 12 years    Types: Cigarettes    Quit date: 07/06/1968  . Smokeless tobacco: Never Used  . Alcohol Use: No     social use  . Drug Use: No  . Sexually Active: Not on file  Other Topics Concern  . Not on file   Social History Narrative  . No narrative on file    ROS: some dysphasia but no fevers or chills, productive cough, hemoptysis, odynophagia, melena, hematochezia, dysuria, hematuria, rash, seizure activity, orthopnea, PND, pedal edema, claudication. Remaining systems are negative.  Physical Exam: Well-developed well-nourished in no acute distress.  Skin is warm and dry.  HEENT is normal.  Neck is supple.  Chest is clear to auscultation with normal expansion. Previous sternotomy Cardiovascular exam is regular rate and rhythm.  Abdominal exam nontender or distended. No masses palpated. Extremities show no edema. neuro grossly intact  ECG sinus bradycardia at a rate of 52. No ST changes.

## 2012-04-21 NOTE — Assessment & Plan Note (Signed)
Continue statin. 

## 2012-04-21 NOTE — Assessment & Plan Note (Signed)
Status post mitral valve repair. Repeat echocardiogram. Continue SBE prophylaxis. 

## 2012-04-25 ENCOUNTER — Encounter: Payer: Self-pay | Admitting: Gastroenterology

## 2012-04-25 ENCOUNTER — Ambulatory Visit (AMBULATORY_SURGERY_CENTER): Payer: Medicare Other | Admitting: Gastroenterology

## 2012-04-25 VITALS — BP 143/75 | HR 52 | Temp 97.2°F | Resp 18 | Ht 69.0 in | Wt 187.0 lb

## 2012-04-25 DIAGNOSIS — I059 Rheumatic mitral valve disease, unspecified: Secondary | ICD-10-CM | POA: Diagnosis not present

## 2012-04-25 DIAGNOSIS — I82409 Acute embolism and thrombosis of unspecified deep veins of unspecified lower extremity: Secondary | ICD-10-CM | POA: Diagnosis not present

## 2012-04-25 DIAGNOSIS — F458 Other somatoform disorders: Secondary | ICD-10-CM | POA: Diagnosis not present

## 2012-04-25 DIAGNOSIS — K219 Gastro-esophageal reflux disease without esophagitis: Secondary | ICD-10-CM

## 2012-04-25 DIAGNOSIS — I2510 Atherosclerotic heart disease of native coronary artery without angina pectoris: Secondary | ICD-10-CM | POA: Diagnosis not present

## 2012-04-25 DIAGNOSIS — R1310 Dysphagia, unspecified: Secondary | ICD-10-CM | POA: Diagnosis not present

## 2012-04-25 DIAGNOSIS — K2990 Gastroduodenitis, unspecified, without bleeding: Secondary | ICD-10-CM | POA: Diagnosis not present

## 2012-04-25 DIAGNOSIS — K2970 Gastritis, unspecified, without bleeding: Secondary | ICD-10-CM | POA: Diagnosis not present

## 2012-04-25 DIAGNOSIS — R1319 Other dysphagia: Secondary | ICD-10-CM

## 2012-04-25 DIAGNOSIS — I2699 Other pulmonary embolism without acute cor pulmonale: Secondary | ICD-10-CM | POA: Diagnosis not present

## 2012-04-25 DIAGNOSIS — R0989 Other specified symptoms and signs involving the circulatory and respiratory systems: Secondary | ICD-10-CM

## 2012-04-25 MED ORDER — SODIUM CHLORIDE 0.9 % IV SOLN: 500.0000 mL | INTRAVENOUS | Status: DC

## 2012-04-25 NOTE — Op Note (Signed)
[facility] [address] [place], [address]   ENDOSCOPY PROCEDURE REPORT  PATIENT: John, [name]  MR#: [id number] BIRTHDATE: 11-22-1935 , 76  yrs. old GENDER: Male ENDOSCOPIST: Meryl Dare, MD, [facility]                         n] PROCEDURE DATE:  04/25/2012 PROCEDURE:  EGD w/ biopsy ASA CLASS:     Class III INDICATIONS:  history of esophageal reflux, globus MEDICATIONS: MAC sedation, administered by CRNA and propofol (Diprivan) 180mg  IV TOPICAL ANESTHETIC: Cetacaine Spray DESCRIPTION OF PROCEDURE: After the risks benefits and alternatives of the procedure were thoroughly explained, informed consent was obtained.  The LB-GIF Q180 [id number] endoscope was introduced through the mouth and advanced to the second portion of the duodenum. Without limitations.  The instrument was slowly withdrawn as the mucosa was fully examined.   ESOPHAGUS: An irregular Z-line was observed.  Multiple biopsies were performed.   The esophagus was otherwise normal. STOMACH: Mild gastritis (inflammation) was found in the gastric antrum and gastric body.  Multiple biopsies were performed.   The stomach otherwise appeared normal. DUODENUM: The duodenal mucosa showed no abnormalities in the bulb and second portion of the duodenum.  Retroflexed views revealed a hiatal hernia.     The scope was then withdrawn from the patient and the procedure completed.  COMPLICATIONS: There were no complications. ENDOSCOPIC IMPRESSION: 1.   Irregular Z-line; multiple biopsies 2.   Gastritis; multiple biopsies 3.   Small hiatal hernia  RECOMMENDATIONS: 1.  await pathology results 2.  anti-reflux regimen long term 3.  continue PPI long term 4.  restart warfarin today  e]  eSigned:  Meryl Dare, MD, [facility] 04/25/2012 3:21 PM   XB:JYNWGN Jenne Pane, MD

## 2012-04-25 NOTE — Patient Instructions (Signed)
Biopsies taken today. Gastritis and Anti-reflux handouts given. Resume current medications today. Restart Warfarin today. Call us with any questions or concerns. Thank you!!  YOU HAD AN ENDOSCOPIC PROCEDURE TODAY AT THE [facility]: Refer to the procedure report that was given to you for any specific questions about what was found during the examination.  If the procedure report does not answer your questions, please call your gastroenterologist to clarify.  If you requested that your care partner not be given the details of your procedure findings, then the procedure report has been included in a sealed envelope for you to review at your convenience later.  YOU SHOULD EXPECT: Some feelings of bloating in the abdomen. Passage of more gas than usual.  Walking can help get rid of the air that was put into your GI tract during the procedure and reduce the bloating. If you had a lower endoscopy (such as a colonoscopy or flexible sigmoidoscopy) you may notice spotting of blood in your stool or on the toilet paper. If you underwent a bowel prep for your procedure, then you may not have a normal bowel movement for a few days.  DIET: Your first meal following the procedure should be a light meal and then it is ok to progress to your normal diet.  A half-sandwich or bowl of soup is an example of a good first meal.  Heavy or fried foods are harder to digest and may make you feel nauseous or bloated.  Likewise meals heavy in dairy and vegetables can cause extra gas to form and this can also increase the bloating.  Drink plenty of fluids but you should avoid alcoholic beverages for 24 hours.  ACTIVITY: Your care partner should take you home directly after the procedure.  You should plan to take it easy, moving slowly for the rest of the day.  You can resume normal activity the day after the procedure however you should NOT DRIVE or use heavy machinery for 24 hours (because of the sedation medicines used  during the test).    SYMPTOMS TO REPORT IMMEDIATELY: A gastroenterologist can be reached at any hour.  During normal business hours, 8:30 AM to 5:00 PM Monday through Friday, call [phone].  After hours and on weekends, please call the GI answering service at [phone] who will take a message and have the physician on call contact you.   Following upper endoscopy (EGD)  Vomiting of blood or coffee ground material  New chest pain or pain under the shoulder blades  Painful or persistently difficult swallowing  New shortness of breath  Fever of 100F or higher  Black, tarry-looking stools  FOLLOW UP: If any biopsies were taken you will be contacted by phone or by letter within the next 1-3 weeks.  Call your gastroenterologist if you have not heard about the biopsies in 3 weeks.  Our staff will call the home number listed on your records the next business day following your procedure to check on you and address any questions or concerns that you may have at that time regarding the information given to you following your procedure. This is a courtesy call and so if there is no answer at the home number and we have not heard from you through the emergency physician on call, we will assume that you have returned to your regular daily activities without incident.  SIGNATURES/CONFIDENTIALITY: You and/or your care partner have signed paperwork which will be entered into your electronic medical record.  These signatures attest to the fact that that the information above on your After Visit Summary has been reviewed and is understood.  Full responsibility of the confidentiality of this discharge information lies with you and/or your care-partner.  

## 2012-04-25 NOTE — Progress Notes (Signed)
Patient did not experience any of the following events: a burn prior to discharge; a fall within the facility; wrong site/side/patient/procedure/implant event; or a hospital transfer or hospital admission upon discharge from the facility. (G8907) Patient did not have preoperative order for IV antibiotic SSI prophylaxis. (G8918)  

## 2012-04-26 ENCOUNTER — Telehealth: Payer: Self-pay | Admitting: *Deleted

## 2012-04-26 NOTE — Telephone Encounter (Signed)
  Follow up Call-  Call back number 04/25/2012 05/20/2011  Post procedure Call Back phone  # [phone] not able to leave message  Permission to leave phone message Yes -     Patient questions:  Do you have a fever, pain , or abdominal swelling? no Pain Score  0 *  Have you tolerated food without any problems? yes  Have you been able to return to your normal activities? yes  Do you have any questions about your discharge instructions: Diet   no Medications  no Follow up visit  no  Do you have questions or concerns about your Care? no  Actions: * If pain score is 4 or above: No action needed, pain <4.   Follow up Call-  Call back number 04/25/2012 05/20/2011  Post procedure Call Back phone  # [phone] not able to leave message  Permission to leave phone message Yes -     Patient questions:  Do you have a fever, pain , or abdominal swelling? no Pain Score  0 *  Have you tolerated food without any problems? yes  Have you been able to return to your normal activities? yes  Do you have any questions about your discharge instructions: Diet   no Medications  no Follow up visit  no  Do you have questions or concerns about your Care? no  Actions: * If pain score is 4 or above: No action needed, pain <4.

## 2012-04-27 ENCOUNTER — Ambulatory Visit (HOSPITAL_COMMUNITY): Payer: Medicare Other | Attending: Cardiovascular Disease | Admitting: Radiology

## 2012-04-27 DIAGNOSIS — I2510 Atherosclerotic heart disease of native coronary artery without angina pectoris: Secondary | ICD-10-CM | POA: Diagnosis not present

## 2012-04-27 DIAGNOSIS — I369 Nonrheumatic tricuspid valve disorder, unspecified: Secondary | ICD-10-CM | POA: Diagnosis not present

## 2012-04-27 DIAGNOSIS — I379 Nonrheumatic pulmonary valve disorder, unspecified: Secondary | ICD-10-CM | POA: Insufficient documentation

## 2012-04-27 DIAGNOSIS — I10 Essential (primary) hypertension: Secondary | ICD-10-CM | POA: Insufficient documentation

## 2012-04-27 DIAGNOSIS — Z9889 Other specified postprocedural states: Secondary | ICD-10-CM

## 2012-04-27 DIAGNOSIS — I059 Rheumatic mitral valve disease, unspecified: Secondary | ICD-10-CM | POA: Diagnosis not present

## 2012-04-27 NOTE — Progress Notes (Signed)
Echocardiogram performed.  

## 2012-04-28 ENCOUNTER — Encounter: Payer: Self-pay | Admitting: Gastroenterology

## 2012-05-03 ENCOUNTER — Encounter (INDEPENDENT_AMBULATORY_CARE_PROVIDER_SITE_OTHER): Payer: Medicare Other

## 2012-05-03 DIAGNOSIS — I6529 Occlusion and stenosis of unspecified carotid artery: Secondary | ICD-10-CM

## 2012-05-06 ENCOUNTER — Ambulatory Visit (INDEPENDENT_AMBULATORY_CARE_PROVIDER_SITE_OTHER): Payer: Medicare Other

## 2012-05-06 DIAGNOSIS — I82409 Acute embolism and thrombosis of unspecified deep veins of unspecified lower extremity: Secondary | ICD-10-CM | POA: Diagnosis not present

## 2012-05-06 DIAGNOSIS — Z7901 Long term (current) use of anticoagulants: Secondary | ICD-10-CM | POA: Diagnosis not present

## 2012-05-06 DIAGNOSIS — I2699 Other pulmonary embolism without acute cor pulmonale: Secondary | ICD-10-CM | POA: Diagnosis not present

## 2012-05-06 LAB — POCT INR: INR: 2

## 2012-05-07 DIAGNOSIS — H2510 Age-related nuclear cataract, unspecified eye: Secondary | ICD-10-CM | POA: Diagnosis not present

## 2012-05-07 DIAGNOSIS — H5210 Myopia, unspecified eye: Secondary | ICD-10-CM | POA: Diagnosis not present

## 2012-05-17 ENCOUNTER — Other Ambulatory Visit: Payer: Self-pay | Admitting: Pulmonary Disease

## 2012-06-06 ENCOUNTER — Ambulatory Visit (INDEPENDENT_AMBULATORY_CARE_PROVIDER_SITE_OTHER): Payer: Medicare Other | Admitting: *Deleted

## 2012-06-06 DIAGNOSIS — Z7901 Long term (current) use of anticoagulants: Secondary | ICD-10-CM | POA: Diagnosis not present

## 2012-06-06 DIAGNOSIS — I2699 Other pulmonary embolism without acute cor pulmonale: Secondary | ICD-10-CM | POA: Diagnosis not present

## 2012-06-06 DIAGNOSIS — I82409 Acute embolism and thrombosis of unspecified deep veins of unspecified lower extremity: Secondary | ICD-10-CM | POA: Diagnosis not present

## 2012-06-06 LAB — POCT INR: INR: 2.5

## 2012-06-17 ENCOUNTER — Other Ambulatory Visit (HOSPITAL_COMMUNITY): Payer: Self-pay | Admitting: Cardiology

## 2012-08-02 ENCOUNTER — Ambulatory Visit (INDEPENDENT_AMBULATORY_CARE_PROVIDER_SITE_OTHER): Payer: Medicare Other | Admitting: *Deleted

## 2012-08-02 DIAGNOSIS — I2699 Other pulmonary embolism without acute cor pulmonale: Secondary | ICD-10-CM

## 2012-08-02 DIAGNOSIS — I82409 Acute embolism and thrombosis of unspecified deep veins of unspecified lower extremity: Secondary | ICD-10-CM | POA: Diagnosis not present

## 2012-08-02 DIAGNOSIS — Z7901 Long term (current) use of anticoagulants: Secondary | ICD-10-CM | POA: Diagnosis not present

## 2012-08-02 LAB — POCT INR: INR: 2.5

## 2012-08-30 ENCOUNTER — Ambulatory Visit (INDEPENDENT_AMBULATORY_CARE_PROVIDER_SITE_OTHER): Payer: Medicare Other | Admitting: *Deleted

## 2012-08-30 DIAGNOSIS — I82409 Acute embolism and thrombosis of unspecified deep veins of unspecified lower extremity: Secondary | ICD-10-CM | POA: Diagnosis not present

## 2012-08-30 DIAGNOSIS — I2699 Other pulmonary embolism without acute cor pulmonale: Secondary | ICD-10-CM

## 2012-08-30 DIAGNOSIS — Z7901 Long term (current) use of anticoagulants: Secondary | ICD-10-CM

## 2012-09-12 ENCOUNTER — Encounter: Payer: Self-pay | Admitting: Pulmonary Disease

## 2012-09-12 ENCOUNTER — Ambulatory Visit (INDEPENDENT_AMBULATORY_CARE_PROVIDER_SITE_OTHER): Payer: Medicare Other | Admitting: Pulmonary Disease

## 2012-09-12 VITALS — BP 134/62 | HR 60 | Temp 97.4°F | Ht 69.0 in | Wt 195.8 lb

## 2012-09-12 DIAGNOSIS — I25810 Atherosclerosis of coronary artery bypass graft(s) without angina pectoris: Secondary | ICD-10-CM

## 2012-09-12 DIAGNOSIS — I2510 Atherosclerotic heart disease of native coronary artery without angina pectoris: Secondary | ICD-10-CM

## 2012-09-12 DIAGNOSIS — I82409 Acute embolism and thrombosis of unspecified deep veins of unspecified lower extremity: Secondary | ICD-10-CM

## 2012-09-12 DIAGNOSIS — I2699 Other pulmonary embolism without acute cor pulmonale: Secondary | ICD-10-CM | POA: Diagnosis not present

## 2012-09-12 DIAGNOSIS — M1990 Unspecified osteoarthritis, unspecified site: Secondary | ICD-10-CM

## 2012-09-12 DIAGNOSIS — K225 Diverticulum of esophagus, acquired: Secondary | ICD-10-CM

## 2012-09-12 DIAGNOSIS — I6529 Occlusion and stenosis of unspecified carotid artery: Secondary | ICD-10-CM

## 2012-09-12 DIAGNOSIS — I10 Essential (primary) hypertension: Secondary | ICD-10-CM | POA: Diagnosis not present

## 2012-09-12 DIAGNOSIS — R1310 Dysphagia, unspecified: Secondary | ICD-10-CM

## 2012-09-12 DIAGNOSIS — I059 Rheumatic mitral valve disease, unspecified: Secondary | ICD-10-CM

## 2012-09-12 DIAGNOSIS — E785 Hyperlipidemia, unspecified: Secondary | ICD-10-CM

## 2012-09-12 DIAGNOSIS — K219 Gastro-esophageal reflux disease without esophagitis: Secondary | ICD-10-CM

## 2012-09-12 DIAGNOSIS — C61 Malignant neoplasm of prostate: Secondary | ICD-10-CM

## 2012-09-12 DIAGNOSIS — M542 Cervicalgia: Secondary | ICD-10-CM

## 2012-09-12 DIAGNOSIS — I4891 Unspecified atrial fibrillation: Secondary | ICD-10-CM

## 2012-09-12 DIAGNOSIS — L989 Disorder of the skin and subcutaneous tissue, unspecified: Secondary | ICD-10-CM

## 2012-09-12 MED ORDER — AMLODIPINE BESYLATE 10 MG PO TABS: ORAL_TABLET | ORAL | Status: DC

## 2012-09-12 MED ORDER — CLONAZEPAM 0.5 MG PO TABS: 0.5000 mg | ORAL_TABLET | Freq: Two times a day (BID) | ORAL | Status: DC | PRN

## 2012-09-12 MED ORDER — METOPROLOL TARTRATE 50 MG PO TABS: 50.0000 mg | ORAL_TABLET | Freq: Two times a day (BID) | ORAL | Status: DC

## 2012-09-12 MED ORDER — PANTOPRAZOLE SODIUM 40 MG PO TBEC: 40.0000 mg | DELAYED_RELEASE_TABLET | Freq: Every day | ORAL | Status: DC

## 2012-09-12 MED ORDER — LOSARTAN POTASSIUM 100 MG PO TABS: 100.0000 mg | ORAL_TABLET | Freq: Every day | ORAL | Status: DC

## 2012-09-12 MED ORDER — DOXAZOSIN MESYLATE 8 MG PO TABS: 8.0000 mg | ORAL_TABLET | Freq: Every day | ORAL | Status: DC

## 2012-09-12 MED ORDER — PRAVASTATIN SODIUM 40 MG PO TABS: 40.0000 mg | ORAL_TABLET | Freq: Every day | ORAL | Status: DC
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
## 2012-09-12 NOTE — Progress Notes (Deleted)
[name], male   DOB: 1936-05-08, 77 y.o.   MRN: [id number]

## 2012-09-12 NOTE — Patient Instructions (Addendum)
Today we updated your med list in our EPIC system...    Continue your current medications the same...    We refilled your meds per request...  We decided to try [facility] 0.5mg  1/2 to 1 tab twice daily for the "lumpy feeling" in your throat (Globus) and swallowing difficulty to see if this provides a measure of improvement...  Please call me is 3-4 weeks to report your response.  Please return to the Lab one morning this week for a FASTING lipid profile on the 40mg  Pravastatin med (we can decide on incr to 80mg  vs switching drugs)...  Call for any questions...  Let's plan a follow up visit in 6 months, sooner if needed for any reason.Marland KitchenMarland Kitchen

## 2012-09-13 ENCOUNTER — Other Ambulatory Visit: Payer: Self-pay

## 2012-09-13 DIAGNOSIS — D0439 Carcinoma in situ of skin of other parts of face: Secondary | ICD-10-CM | POA: Diagnosis not present

## 2012-09-14 ENCOUNTER — Other Ambulatory Visit (INDEPENDENT_AMBULATORY_CARE_PROVIDER_SITE_OTHER): Payer: Medicare Other

## 2012-09-14 ENCOUNTER — Ambulatory Visit (INDEPENDENT_AMBULATORY_CARE_PROVIDER_SITE_OTHER): Payer: Medicare Other | Admitting: Pharmacist

## 2012-09-14 DIAGNOSIS — E785 Hyperlipidemia, unspecified: Secondary | ICD-10-CM | POA: Diagnosis not present

## 2012-09-14 DIAGNOSIS — Z7901 Long term (current) use of anticoagulants: Secondary | ICD-10-CM | POA: Diagnosis not present

## 2012-09-14 DIAGNOSIS — I2699 Other pulmonary embolism without acute cor pulmonale: Secondary | ICD-10-CM

## 2012-09-14 DIAGNOSIS — I82409 Acute embolism and thrombosis of unspecified deep veins of unspecified lower extremity: Secondary | ICD-10-CM

## 2012-09-14 LAB — POCT INR: INR: 2.7

## 2012-09-14 LAB — LIPID PANEL: VLDL: 16.4 mg/dL (ref 0.0–40.0)

## 2012-10-05 ENCOUNTER — Telehealth: Payer: Self-pay | Admitting: Pulmonary Disease

## 2012-10-05 NOTE — Telephone Encounter (Signed)
Per 3.10.14 ov w/ SN:  Patient Instructions    Today we updated your med list in our EPIC system...  Continue your current medications the same...  We refilled your meds per request...  We decided to try [facility] 0.5mg  1/2 to 1 tab twice daily for the "lumpy feeling" in your throat (Globus) and swallowing difficulty to see if this provides a measure of improvement... Please call me is 3-4 weeks to report your response.  Please return to the Lab one morning this week for a FASTING lipid profile on the 40mg  Pravastatin med (we can decide on incr to 80mg  vs switching drugs)...  Call for any questions...  Let's plan a follow up visit in 6 months, sooner if needed for any reason.Marland KitchenMarland Kitchen

## 2012-10-05 NOTE — Telephone Encounter (Signed)
I spoke with pt. He stated the klonopin is not working. He can't stay awake and does not do anything for his throat. Pt is wanting to know if he needs to go back to Dr. Jenne Pane. Please advise SN thanks  Last OV 09/12/12 No pending OV

## 2012-10-06 NOTE — Telephone Encounter (Signed)
Pt advised and states he will contact both docs for appts. Carron Curie, CMA

## 2012-10-06 NOTE — Telephone Encounter (Signed)
Per SN---  Ok to stop klonopin if no response.  Yes needs ENT   Dr. Jenne Pane for further eval---and may need another GI eval as well.  thanks

## 2012-10-11 ENCOUNTER — Encounter: Payer: Self-pay | Admitting: Cardiology

## 2012-10-11 ENCOUNTER — Ambulatory Visit (INDEPENDENT_AMBULATORY_CARE_PROVIDER_SITE_OTHER): Payer: Medicare Other | Admitting: *Deleted

## 2012-10-11 ENCOUNTER — Ambulatory Visit (INDEPENDENT_AMBULATORY_CARE_PROVIDER_SITE_OTHER): Payer: Medicare Other | Admitting: Cardiology

## 2012-10-11 VITALS — BP 150/66 | HR 56 | Ht 69.0 in | Wt 190.4 lb

## 2012-10-11 DIAGNOSIS — I2510 Atherosclerotic heart disease of native coronary artery without angina pectoris: Secondary | ICD-10-CM

## 2012-10-11 DIAGNOSIS — I6529 Occlusion and stenosis of unspecified carotid artery: Secondary | ICD-10-CM | POA: Diagnosis not present

## 2012-10-11 DIAGNOSIS — I2699 Other pulmonary embolism without acute cor pulmonale: Secondary | ICD-10-CM

## 2012-10-11 DIAGNOSIS — Z7901 Long term (current) use of anticoagulants: Secondary | ICD-10-CM

## 2012-10-11 DIAGNOSIS — I82409 Acute embolism and thrombosis of unspecified deep veins of unspecified lower extremity: Secondary | ICD-10-CM

## 2012-10-11 DIAGNOSIS — E785 Hyperlipidemia, unspecified: Secondary | ICD-10-CM

## 2012-10-11 DIAGNOSIS — I059 Rheumatic mitral valve disease, unspecified: Secondary | ICD-10-CM

## 2012-10-11 DIAGNOSIS — I10 Essential (primary) hypertension: Secondary | ICD-10-CM

## 2012-10-11 LAB — POCT INR: INR: 3.1

## 2012-10-11 NOTE — Assessment & Plan Note (Signed)
Continue statin. 

## 2012-10-11 NOTE — Assessment & Plan Note (Signed)
Her blood pressure mildly elevated but he states normal at home.continue present medications.

## 2012-10-11 NOTE — Assessment & Plan Note (Signed)
Status post mitral valve repair. Continue SBE prophylaxis. 

## 2012-10-11 NOTE — Assessment & Plan Note (Addendum)
Continue statin. Followup carotid Dopplers October 2015.

## 2012-10-11 NOTE — Progress Notes (Signed)
HPI: John [name] is a pleasant gentleman who has a history of coronary artery disease status post coronary bypassing graft, as well as mitral valve repair in January 2009. The patient had an Myoview performed on July 12, 2008. This was normal with an ejection fraction of 66%. Last echocardiogram in October of 2013 showed normal LV function, status post mitral valve repair with normal gradients and trace mitral regurgitation. There was mild left atrial enlargement. Last carotid Dopplers performed in October 2013 showed 40-59% left and 0-39% right stenosis; fu recommended in 2 years. I last saw him in Oct 2013. Since then the patient denies any dyspnea on exertion, orthopnea, PND, pedal edema, palpitations, syncope or chest pain.   Current Outpatient Prescriptions  Medication Sig Dispense Refill  . amLODipine (NORVASC) 10 MG tablet Take 1/2 tablet by mouth daily  45 tablet  3  . doxazosin (CARDURA) 8 MG tablet Take 1 tablet (8 mg total) by mouth at bedtime.  90 tablet  3  . losartan (COZAAR) 100 MG tablet Take 1 tablet (100 mg total) by mouth daily.  90 tablet  3  . metoprolol (LOPRESSOR) 50 MG tablet Take 1 tablet (50 mg total) by mouth 2 (two) times daily.  180 tablet  3  . pantoprazole (PROTONIX) 40 MG tablet Take 1 tablet (40 mg total) by mouth daily.  90 tablet  3  . pravastatin (PRAVACHOL) 40 MG tablet Take 1 tablet (40 mg total) by mouth daily.  90 tablet  3  . warfarin (COUMADIN) 2.5 MG tablet Take 1.25-2.5 mg by mouth daily. Alternating with 0.5 tablet and 1 tablet every other day.       . warfarin (COUMADIN) 2.5 MG tablet TAKE 1 TABLET BY MOUTH AS DIRECTED.  90 tablet  2  . clonazePAM (KLONOPIN) 0.5 MG tablet Take 0.5 mg by mouth 2 (two) times daily. For anxiety        No current facility-administered medications for this visit.     Past Medical History  Diagnosis Date  . PE (pulmonary embolism)   . Deep vein thrombophlebitis of leg   . Essential hypertension, benign   . CAD  (coronary artery disease)     s/p CABG  . Carotid stenosis   . Hyperlipidemia   . GERD (gastroesophageal reflux disease)   . Hemorrhoids   . Prostate cancer   . DJD (degenerative joint disease)   . Sebaceous cyst     neck  . Seborrheic dermatitis   . Anxiety   . Hiatal hernia   . Tubular adenoma of colon 05/2011    Past Surgical History  Procedure Laterality Date  . Tandem and ovoid insertion    . Vasectomy    . Prostate cancer surgery  11/1996    radial retropubic prostatectomy  . Coronary artery bypass graft      x2  . Mitral valve repair    . Bilateral shoulder surgery    . Left knee arthroscopy    . I&d extremity  07/09/2011    Procedure: IRRIGATION AND DEBRIDEMENT EXTREMITY;  Surgeon: Tami Ribas;  Location: [facility];  Service: Orthopedics;  Laterality: Left;    History   Social History  . Marital Status: Married    Spouse Name: margaret    Number of Children: 3  . Years of Education: N/A   Occupational History  . retired    Social History Main Topics  . Smoking status: Former Smoker -- 1.00 packs/day for 12 years  Types: Cigarettes    Quit date: 07/06/1968  . Smokeless tobacco: Never Used  . Alcohol Use: No     Comment: social use  . Drug Use: No  . Sexually Active: Not on file   Other Topics Concern  . Not on file   Social History Narrative  . No narrative on file    ROS: some dysphagia and Zenker's diverticulum but no fevers or chills, productive cough, hemoptysis,  odynophagia, melena, hematochezia, dysuria, hematuria, rash, seizure activity, orthopnea, PND, pedal edema, claudication. Remaining systems are negative.  Physical Exam: Well-developed well-nourished in no acute distress.  Skin is warm and dry.  HEENT is normal.  Neck is supple.  Chest is clear to auscultation with normal expansion.  Cardiovascular exam is regular rate and rhythm.  Abdominal exam nontender or distended. No masses palpated. Extremities show no  edema. neuro grossly intact  ECGsinus rhythm at a rate of 56. Nonspecific ST changes.

## 2012-10-11 NOTE — Assessment & Plan Note (Signed)
Continue statin. Not on aspirin given need for Coumadin. 

## 2012-10-11 NOTE — Patient Instructions (Addendum)
Your physician wants you to follow-up in: ONE YEAR WITH DR CRENSHAW You will receive a reminder letter in the mail two months in advance. If you don't receive a letter, please call our office to schedule the follow-up appointment.  

## 2012-10-25 DIAGNOSIS — K225 Diverticulum of esophagus, acquired: Secondary | ICD-10-CM | POA: Diagnosis not present

## 2012-10-25 DIAGNOSIS — R1314 Dysphagia, pharyngoesophageal phase: Secondary | ICD-10-CM | POA: Diagnosis not present

## 2012-10-26 ENCOUNTER — Other Ambulatory Visit (HOSPITAL_COMMUNITY): Payer: Self-pay | Admitting: Otolaryngology

## 2012-10-28 ENCOUNTER — Encounter (HOSPITAL_COMMUNITY): Payer: Self-pay | Admitting: Pharmacy Technician

## 2012-11-01 ENCOUNTER — Ambulatory Visit (HOSPITAL_COMMUNITY)
Admission: RE | Admit: 2012-11-01 | Discharge: 2012-11-01 | Disposition: A | Discharge status: Home / Self Care | Payer: Medicare Other | Source: Ambulatory Visit | Attending: Anesthesiology | Admitting: Anesthesiology

## 2012-11-01 ENCOUNTER — Encounter (HOSPITAL_COMMUNITY): Payer: Self-pay

## 2012-11-01 ENCOUNTER — Other Ambulatory Visit (HOSPITAL_COMMUNITY): Payer: Self-pay | Admitting: Otolaryngology

## 2012-11-01 ENCOUNTER — Encounter (HOSPITAL_COMMUNITY)
Admission: RE | Admit: 2012-11-01 | Discharge: 2012-11-01 | Disposition: A | Discharge status: Home / Self Care | Payer: Medicare Other | Source: Ambulatory Visit | Attending: Otolaryngology | Admitting: Otolaryngology

## 2012-11-01 DIAGNOSIS — I2510 Atherosclerotic heart disease of native coronary artery without angina pectoris: Secondary | ICD-10-CM | POA: Diagnosis not present

## 2012-11-01 DIAGNOSIS — I658 Occlusion and stenosis of other precerebral arteries: Secondary | ICD-10-CM | POA: Insufficient documentation

## 2012-11-01 DIAGNOSIS — F411 Generalized anxiety disorder: Secondary | ICD-10-CM | POA: Insufficient documentation

## 2012-11-01 DIAGNOSIS — Z01810 Encounter for preprocedural cardiovascular examination: Secondary | ICD-10-CM | POA: Insufficient documentation

## 2012-11-01 DIAGNOSIS — K219 Gastro-esophageal reflux disease without esophagitis: Secondary | ICD-10-CM | POA: Diagnosis not present

## 2012-11-01 DIAGNOSIS — Z01812 Encounter for preprocedural laboratory examination: Secondary | ICD-10-CM | POA: Insufficient documentation

## 2012-11-01 DIAGNOSIS — Z01818 Encounter for other preprocedural examination: Secondary | ICD-10-CM | POA: Diagnosis not present

## 2012-11-01 DIAGNOSIS — E785 Hyperlipidemia, unspecified: Secondary | ICD-10-CM | POA: Insufficient documentation

## 2012-11-01 DIAGNOSIS — Z8546 Personal history of malignant neoplasm of prostate: Secondary | ICD-10-CM | POA: Insufficient documentation

## 2012-11-01 DIAGNOSIS — I10 Essential (primary) hypertension: Secondary | ICD-10-CM | POA: Insufficient documentation

## 2012-11-01 DIAGNOSIS — F17200 Nicotine dependence, unspecified, uncomplicated: Secondary | ICD-10-CM | POA: Diagnosis not present

## 2012-11-01 DIAGNOSIS — Z7901 Long term (current) use of anticoagulants: Secondary | ICD-10-CM | POA: Insufficient documentation

## 2012-11-01 DIAGNOSIS — Z951 Presence of aortocoronary bypass graft: Secondary | ICD-10-CM | POA: Diagnosis not present

## 2012-11-01 DIAGNOSIS — Z860100 Personal history of colon polyps, unspecified: Secondary | ICD-10-CM | POA: Insufficient documentation

## 2012-11-01 DIAGNOSIS — K449 Diaphragmatic hernia without obstruction or gangrene: Secondary | ICD-10-CM | POA: Diagnosis not present

## 2012-11-01 DIAGNOSIS — I6529 Occlusion and stenosis of unspecified carotid artery: Secondary | ICD-10-CM | POA: Insufficient documentation

## 2012-11-01 DIAGNOSIS — Z86711 Personal history of pulmonary embolism: Secondary | ICD-10-CM | POA: Diagnosis not present

## 2012-11-01 DIAGNOSIS — Z86718 Personal history of other venous thrombosis and embolism: Secondary | ICD-10-CM | POA: Diagnosis not present

## 2012-11-01 HISTORY — DX: Adverse effect of unspecified anesthetic, initial encounter: T41.45XA

## 2012-11-01 HISTORY — DX: Other complications of anesthesia, initial encounter: T88.59XA

## 2012-11-01 LAB — CBC
HCT: 39.4 % (ref 39.0–52.0)
MCV: 81.4 fL (ref 78.0–100.0)
RBC: 4.84 MIL/uL (ref 4.22–5.81)
RDW: 14 % (ref 11.5–15.5)
WBC: 5.1 10*3/uL (ref 4.0–10.5)

## 2012-11-01 LAB — BASIC METABOLIC PANEL
CO2: 24 mEq/L (ref 19–32)
Chloride: 107 mEq/L (ref 96–112)
Creatinine, Ser: 0.94 mg/dL (ref 0.50–1.35)
GFR calc Af Amer: >90 mL/min (ref >90)
Potassium: 4.4 mEq/L (ref 3.5–5.1)
Sodium: 139 mEq/L (ref 135–145)

## 2012-11-01 LAB — APTT: aPTT: 45 seconds — ABNORMAL HIGH (ref 24–37)

## 2012-11-01 LAB — PROTIME-INR: INR: 2.42 — ABNORMAL HIGH (ref 0.00–1.49)

## 2012-11-01 LAB — SURGICAL PCR SCREEN: Staphylococcus aureus: NEGATIVE

## 2012-11-01 NOTE — Progress Notes (Signed)
Will review labs and cardiac update with [facility]

## 2012-11-01 NOTE — Pre-Procedure Instructions (Signed)
11/07/12 Lenore Manner  11/01/2012   Your procedure is scheduled on:11/07/12    Report to [facility] at 8 AM.  Call this number if you have problems the morning of surgery: [phone]   Remember:   Do not eat food or drink liquids after midnight.   Take these medicines the morning of surgery with A SIP OF WATER: norvasc,cardura,metoprolol,protonix   Do not wear jewelry, make-up or nail polish.  Do not wear lotions, powders, or perfumes. You may wear deodorant.  Do not shave 48 hours prior to surgery. Men may shave face and neck.  Do not bring valuables to the hospital.  Contacts, dentures or bridgework may not be worn into surgery.  Leave suitcase in the car. After surgery it may be brought to your room.  For patients admitted to the hospital, checkout time is 11:00 AM the day of  discharge.   Patients discharged the day of surgery will not be allowed to drive  home.  Name and phone number of your driver: family  Special Instructions: Incentive Spirometry - Practice and bring it with you on the day of surgery.   Please read over the following fact sheets that you were given: Pain Booklet, Coughing and Deep Breathing, MRSA Information and Surgical Site Infection Prevention

## 2012-11-02 NOTE — Progress Notes (Signed)
Anesthesia Chart Review:  Patient is a 77 year old male scheduled for Zenkers diverticulectomy on 11/07/12 by Dr. Jenne Pane.  Other history includes CAD/MR s/p CABG (LIMA to LAD, SVG to CX)/MR repair 07/2007, smoker, DVT/PE '80's on anticoagulation therapy, HTN, HLD, carotid artery stenosis (0-39% RICAS and 40-59% LICAS 04/2012), GERD, hiatal hernia, anxiety, prostate cancer s/p radical prostatectomy '98 (Dr. Vernie Ammons), tubular adenoma of the colon. PCP is Dr. Olean Ree.  Cardiologist is Dr. [name], last visit on 10/11/12.  EKG then showed SR with non-specific ST changes.  Echo on 04/27/12 showed:  - Left ventricle: The cavity size was normal. Systolic function was normal. The estimated ejection fraction was 55%. Wall motion was normal; there were no regional wall motion abnormalities. - Mitral valve: S/P mitral valve repair with normal diastolic gradients and trivial central MR - Left atrium: The atrium was mildly dilated. - Atrial septum: No defect or patent foramen ovale was identified. - Pulmonic valve: Trivial regurgitation. - Tricuspid valve: Mild regurgitation.  His last stress test was in 2007.  His last cardiac cath was on 07/12/07 and cardiac CT on 07/20/07 prior to CABG/MV repair.  CXR on 11/01/12 showed no active disease.   Preoperative labs noted.  Cr 0.94, glucose 109, H/H 13.8/39.4, PLT 135K, PT/INR 25.2/2.42, PTT 45.  He will need repeat PT/PTT on the day of surgery.  I spoke with Elizebeth Brooking at Dr. Jenne Pane office, who reports that Dr. Jenne Pane has already spoken with Dr. [name] about surgery/Coumadin instructions, and patient has already been told when to hold his Coumadin for surgery.  Per Dr. Lamount Cranker nurse/assistant Elizebeth Brooking, Dr. [name] is aware of planned surgery.  Patient was just seen this month and by notes appeared to be doing well from a cardiac standpoint.  He will get repeat coags on the day of surgery, and if results are felt to be in an acceptable range then would anticipate he  could proceed as planned.  Velna Ochs [facility]/Anesthesiology Phone [phone] 11/02/2012 3:43 PM

## 2012-11-07 ENCOUNTER — Observation Stay (HOSPITAL_COMMUNITY)
Admission: RE | Admit: 2012-11-07 | Discharge: 2012-11-08 | Disposition: A | Discharge status: Home / Self Care | Payer: Medicare Other | Source: Ambulatory Visit | Attending: Otolaryngology | Admitting: Otolaryngology

## 2012-11-07 ENCOUNTER — Ambulatory Visit (HOSPITAL_COMMUNITY): Payer: Medicare Other | Admitting: Anesthesiology

## 2012-11-07 ENCOUNTER — Encounter (HOSPITAL_COMMUNITY)
Admission: RE | Disposition: A | Discharge status: Home / Self Care | Payer: Self-pay | Source: Ambulatory Visit | Attending: Otolaryngology

## 2012-11-07 ENCOUNTER — Encounter (HOSPITAL_COMMUNITY): Payer: Self-pay | Admitting: *Deleted

## 2012-11-07 ENCOUNTER — Encounter (HOSPITAL_COMMUNITY): Payer: Self-pay | Admitting: Vascular Surgery

## 2012-11-07 DIAGNOSIS — Z86718 Personal history of other venous thrombosis and embolism: Secondary | ICD-10-CM | POA: Diagnosis not present

## 2012-11-07 DIAGNOSIS — K225 Diverticulum of esophagus, acquired: Secondary | ICD-10-CM | POA: Diagnosis not present

## 2012-11-07 DIAGNOSIS — I2510 Atherosclerotic heart disease of native coronary artery without angina pectoris: Secondary | ICD-10-CM | POA: Diagnosis not present

## 2012-11-07 DIAGNOSIS — I10 Essential (primary) hypertension: Secondary | ICD-10-CM | POA: Insufficient documentation

## 2012-11-07 DIAGNOSIS — R1314 Dysphagia, pharyngoesophageal phase: Secondary | ICD-10-CM | POA: Diagnosis not present

## 2012-11-07 DIAGNOSIS — Z86711 Personal history of pulmonary embolism: Secondary | ICD-10-CM | POA: Diagnosis not present

## 2012-11-07 DIAGNOSIS — Z951 Presence of aortocoronary bypass graft: Secondary | ICD-10-CM | POA: Insufficient documentation

## 2012-11-07 DIAGNOSIS — Z79899 Other long term (current) drug therapy: Secondary | ICD-10-CM | POA: Insufficient documentation

## 2012-11-07 DIAGNOSIS — I6529 Occlusion and stenosis of unspecified carotid artery: Secondary | ICD-10-CM | POA: Insufficient documentation

## 2012-11-07 DIAGNOSIS — K219 Gastro-esophageal reflux disease without esophagitis: Secondary | ICD-10-CM | POA: Diagnosis not present

## 2012-11-07 HISTORY — PX: ZENKER'S DIVERTICULECTOMY: SHX5223

## 2012-11-07 LAB — PROTIME-INR
INR: 1.59 — ABNORMAL HIGH (ref 0.00–1.49)
Prothrombin Time: 18.5 seconds — ABNORMAL HIGH (ref 11.6–15.2)

## 2012-11-07 LAB — APTT: aPTT: 37 seconds (ref 24–37)

## 2012-11-07 SURGERY — ZENKER'S DIVERTICULECTOMY
Anesthesia: General | Site: Mouth | Wound class: Clean Contaminated

## 2012-11-07 MED ORDER — SUCCINYLCHOLINE CHLORIDE 20 MG/ML IJ SOLN
INTRAMUSCULAR | Status: DC | PRN
  Administered 2012-11-07: 100 mg via INTRAVENOUS

## 2012-11-07 MED ORDER — FENTANYL CITRATE 0.05 MG/ML IJ SOLN
INTRAMUSCULAR | Status: DC | PRN
  Administered 2012-11-07 (×2): 100 ug via INTRAVENOUS
  Administered 2012-11-07: 50 ug via INTRAVENOUS

## 2012-11-07 MED ORDER — LACTATED RINGERS IV SOLN
INTRAVENOUS | Status: DC
  Administered 2012-11-07: 10:00:00 via INTRAVENOUS

## 2012-11-07 MED ORDER — FENTANYL CITRATE 0.05 MG/ML IJ SOLN: 25.0000 ug | INTRAMUSCULAR | Status: DC | PRN

## 2012-11-07 MED ORDER — OXYCODONE HCL 5 MG/5ML PO SOLN: 5.0000 mg | Freq: Once | ORAL | Status: DC | PRN

## 2012-11-07 MED ORDER — OXYMETAZOLINE HCL 0.05 % NA SOLN
NASAL | Status: AC
  Filled 2012-11-07: qty 15

## 2012-11-07 MED ORDER — LIDOCAINE HCL (CARDIAC) 20 MG/ML IV SOLN
INTRAVENOUS | Status: DC | PRN
  Administered 2012-11-07: 80 mg via INTRAVENOUS

## 2012-11-07 MED ORDER — OXYCODONE-ACETAMINOPHEN 5-325 MG/5ML PO SOLN: 5.0000 mL | ORAL | Status: DC | PRN

## 2012-11-07 MED ORDER — 0.9 % SODIUM CHLORIDE (POUR BTL) OPTIME
TOPICAL | Status: DC | PRN
  Administered 2012-11-07: 1000 mL

## 2012-11-07 MED ORDER — LACTATED RINGERS IV SOLN
INTRAVENOUS | Status: DC | PRN
  Administered 2012-11-07 (×2): via INTRAVENOUS

## 2012-11-07 MED ORDER — MORPHINE SULFATE 2 MG/ML IJ SOLN: 2.0000 mg | INTRAMUSCULAR | Status: DC | PRN

## 2012-11-07 MED ORDER — PROMETHAZINE HCL 25 MG RE SUPP: 12.5000 mg | Freq: Four times a day (QID) | RECTAL | Status: DC | PRN

## 2012-11-07 MED ORDER — EPINEPHRINE HCL (NASAL) 0.1 % NA SOLN
NASAL | Status: AC
  Filled 2012-11-07: qty 30

## 2012-11-07 MED ORDER — AMLODIPINE BESYLATE 10 MG PO TABS
10.0000 mg | ORAL_TABLET | Freq: Every day | ORAL | Status: DC
Start: 2012-11-07 — End: 2012-11-08
  Filled 2012-11-07 (×2): qty 1

## 2012-11-07 MED ORDER — PROPOFOL 10 MG/ML IV BOLUS
INTRAVENOUS | Status: DC | PRN
  Administered 2012-11-07: 160 mg via INTRAVENOUS
  Administered 2012-11-07: 40 mg via INTRAVENOUS

## 2012-11-07 MED ORDER — KCL IN DEXTROSE-NACL 20-5-0.45 MEQ/L-%-% IV SOLN
INTRAVENOUS | Status: DC
  Administered 2012-11-07 – 2012-11-08 (×2): via INTRAVENOUS
  Filled 2012-11-07 (×5): qty 1000

## 2012-11-07 MED ORDER — ONDANSETRON HCL 4 MG/2ML IJ SOLN: 4.0000 mg | Freq: Four times a day (QID) | INTRAMUSCULAR | Status: DC | PRN

## 2012-11-07 MED ORDER — METOPROLOL TARTRATE 50 MG PO TABS
50.0000 mg | ORAL_TABLET | Freq: Two times a day (BID) | ORAL | Status: DC
  Administered 2012-11-07: 50 mg via ORAL
  Filled 2012-11-07 (×3): qty 1

## 2012-11-07 MED ORDER — EPINEPHRINE HCL (NASAL) 0.1 % NA SOLN
NASAL | Status: DC | PRN
  Administered 2012-11-07: 30 mL via TOPICAL

## 2012-11-07 MED ORDER — PROMETHAZINE HCL 25 MG PO TABS: 12.5000 mg | ORAL_TABLET | Freq: Four times a day (QID) | ORAL | Status: DC | PRN

## 2012-11-07 MED ORDER — MIDAZOLAM HCL 5 MG/5ML IJ SOLN
INTRAMUSCULAR | Status: DC | PRN
  Administered 2012-11-07: 1 mg via INTRAVENOUS

## 2012-11-07 MED ORDER — ONDANSETRON HCL 4 MG/2ML IJ SOLN
INTRAMUSCULAR | Status: DC | PRN
  Administered 2012-11-07: 4 mg via INTRAVENOUS

## 2012-11-07 MED ORDER — DOXAZOSIN MESYLATE 8 MG PO TABS
8.0000 mg | ORAL_TABLET | Freq: Every day | ORAL | Status: DC
  Administered 2012-11-07: 8 mg via ORAL
  Filled 2012-11-07 (×2): qty 1

## 2012-11-07 MED ORDER — PANTOPRAZOLE SODIUM 40 MG PO TBEC
40.0000 mg | DELAYED_RELEASE_TABLET | Freq: Every day | ORAL | Status: DC
  Administered 2012-11-07: 40 mg via ORAL
  Filled 2012-11-07: qty 1

## 2012-11-07 MED ORDER — LOSARTAN POTASSIUM 50 MG PO TABS
100.0000 mg | ORAL_TABLET | Freq: Every day | ORAL | Status: DC
  Filled 2012-11-07: qty 2

## 2012-11-07 MED ORDER — OXYCODONE HCL 5 MG PO TABS: 5.0000 mg | ORAL_TABLET | Freq: Once | ORAL | Status: DC | PRN

## 2012-11-07 SURGICAL SUPPLY — 25 items
CLOTH BEACON ORANGE TIMEOUT ST (SAFETY) ×2 IMPLANT
COVER MAYO STAND STRL (DRAPES) ×2 IMPLANT
COVER SURGICAL LIGHT HANDLE (MISCELLANEOUS) ×2 IMPLANT
COVER TABLE BACK 60X90 (DRAPES) ×2 IMPLANT
CRADLE DONUT ADULT HEAD (MISCELLANEOUS) ×2 IMPLANT
CUTTER LINEAR ENDO 35 ETS (STAPLE) ×2 IMPLANT
DRAPE PROXIMA HALF (DRAPES) ×2 IMPLANT
GLOVE BIO SURGEON STRL SZ7 (GLOVE) ×4 IMPLANT
GLOVE BIO SURGEON STRL SZ7.5 (GLOVE) ×2 IMPLANT
GOWN STRL NON-REIN LRG LVL3 (GOWN DISPOSABLE) ×4 IMPLANT
GUARD TEETH (MISCELLANEOUS) ×2 IMPLANT
KIT BASIN OR (CUSTOM PROCEDURE TRAY) ×2 IMPLANT
KIT ROOM TURNOVER OR (KITS) ×2 IMPLANT
NS IRRIG 1000ML POUR BTL (IV SOLUTION) ×2 IMPLANT
PAD ARMBOARD 7.5X6 YLW CONV (MISCELLANEOUS) ×2 IMPLANT
PATTIES SURGICAL .5 X3 (DISPOSABLE) ×2 IMPLANT
RELOAD CUTTER ETS 35MM STAND (ENDOMECHANICALS) ×2 IMPLANT
SOLUTION ANTI FOG 6CC (MISCELLANEOUS) ×2 IMPLANT
SURGILUBE 2OZ TUBE FLIPTOP (MISCELLANEOUS) ×2 IMPLANT
SUT VICRYL 3-0 RB1 18 ABS (SUTURE) ×2 IMPLANT
SYR BULB IRRIGATION 50ML (SYRINGE) ×2 IMPLANT
TOWEL OR 17X24 6PK STRL BLUE (TOWEL DISPOSABLE) ×2 IMPLANT
TOWEL OR 17X26 10 PK STRL BLUE (TOWEL DISPOSABLE) ×2 IMPLANT
TRAY ENT MC OR (CUSTOM PROCEDURE TRAY) ×2 IMPLANT
TUBE CONNECTING 12X1/4 (SUCTIONS) ×2 IMPLANT

## 2012-11-07 NOTE — Brief Op Note (Signed)
11/07/2012  11:19 AM  PATIENT:  John [name]  77 y.o. male  PRE-OPERATIVE DIAGNOSIS:  Zenker's diverticulum  POST-OPERATIVE DIAGNOSIS:  Zenker's diverticulum  PROCEDURE:  Procedure(s): ZENKER'S DIVERTICULECTOMY ENDOSCOPIC (N/A)  SURGEON:  Surgeon(s) and Role:    * Christia Reading, MD - Primary  PHYSICIAN ASSISTANT:   ASSISTANTS: none   ANESTHESIA:   general  EBL:     BLOOD ADMINISTERED:none  DRAINS: none   LOCAL MEDICATIONS USED:  NONE  SPECIMEN:  No Specimen  DISPOSITION OF SPECIMEN:  N/A  COUNTS:  YES  TOURNIQUET:  * No tourniquets in log *  DICTATION: .Other Dictation: Dictation Number [id number]  PLAN OF CARE: Admit for overnight observation  PATIENT DISPOSITION:  PACU - hemodynamically stable.   Delay start of Pharmacological VTE agent (>24hrs) due to surgical blood loss or risk of bleeding: yes

## 2012-11-07 NOTE — Transfer of Care (Signed)
Immediate Anesthesia Transfer of Care Note  Patient: John [name]  Procedure(s) Performed: Procedure(s): ZENKER'S DIVERTICULECTOMY ENDOSCOPIC (N/A)  Patient Location: PACU  Anesthesia Type:General  Level of Consciousness: awake, alert , oriented, sedated and patient cooperative  Airway & Oxygen Therapy: Patient Spontanous Breathing and Patient connected to nasal cannula oxygen  Post-op Assessment: Report given to PACU RN, Post -op Vital signs reviewed and stable and Patient moving all extremities  Post vital signs: Reviewed and stable  Complications: No apparent anesthesia complications

## 2012-11-07 NOTE — H&P (Signed)
John [name] is an 77 y.o. male.   Chief Complaint: dysphagia HPI: 77 year old male with Zenker's diverticulum with associated dysphagia presents for surgical management.  Past Medical History  Diagnosis Date  . PE (pulmonary embolism)   . Deep vein thrombophlebitis of leg   . Essential hypertension, benign   . CAD (coronary artery disease)     s/p CABG  . Carotid stenosis   . Hyperlipidemia   . GERD (gastroesophageal reflux disease)   . Hemorrhoids   . Prostate cancer   . DJD (degenerative joint disease)   . Sebaceous cyst     neck  . Seborrheic dermatitis   . Anxiety   . Hiatal hernia   . Tubular adenoma of colon 05/2011  . Complication of anesthesia     zenkers  diverticulum    Past Surgical History  Procedure Laterality Date  . Tandem and ovoid insertion    . Vasectomy    . Prostate cancer surgery  11/1996    radial retropubic prostatectomy  . Mitral valve repair    . Bilateral shoulder surgery    . Left knee arthroscopy    . I&d extremity  07/09/2011    Procedure: IRRIGATION AND DEBRIDEMENT EXTREMITY;  Surgeon: Tami Ribas;  Location: [facility];  Service: Orthopedics;  Laterality: Left;  . Coronary artery bypass graft      x2    2009    Family History  Problem Relation Age of Onset  . Heart attack Father   . Stroke Mother   . Hypertension Mother   . Hypertension Brother   . Heart disease Brother   . Prostate cancer Brother    Social History:  reports that he quit smoking about 44 years ago. His smoking use included Cigarettes. He has a 12 pack-year smoking history. He has never used smokeless tobacco. He reports that  drinks alcohol. He reports that he does not use illicit drugs.  Allergies:  Allergies  Allergen Reactions  . Iodine   . Iohexol      Code: HIVES, Desc: PT ALLERGIC TO IV CONTRAST;(REACTION DURING CARDIAC CATHETERIZATION); NEEDS PREMEDS PER DR CRENSHAW!, Onset Date: [id number]   . Statins     REACTION: he is INTOL to all  statins...    Medications Prior to Admission  Medication Sig Dispense Refill  . amLODipine (NORVASC) 10 MG tablet Take 1/2 tablet by mouth daily  45 tablet  3  . doxazosin (CARDURA) 8 MG tablet Take 1 tablet (8 mg total) by mouth at bedtime.  90 tablet  3  . losartan (COZAAR) 100 MG tablet Take 1 tablet (100 mg total) by mouth daily.  90 tablet  3  . metoprolol (LOPRESSOR) 50 MG tablet Take 1 tablet (50 mg total) by mouth 2 (two) times daily.  180 tablet  3  . pantoprazole (PROTONIX) 40 MG tablet Take 1 tablet (40 mg total) by mouth daily.  90 tablet  3  . pravastatin (PRAVACHOL) 40 MG tablet Take 1 tablet (40 mg total) by mouth daily.  90 tablet  3  . warfarin (COUMADIN) 2.5 MG tablet Take 1.25-2.5 mg by mouth daily. 1 tablet every other day on Monday and Thursday. All other days take 0.5 tablet        Results for orders placed during the hospital encounter of 11/07/12 (from the past 48 hour(s))  APTT     Status: None   Collection Time    11/07/12  8:28 AM  Result Value Range   aPTT 37  24 - 37 seconds   Comment:            IF BASELINE aPTT IS ELEVATED,     SUGGEST PATIENT RISK ASSESSMENT     BE USED TO DETERMINE APPROPRIATE     ANTICOAGULANT THERAPY.  PROTIME-INR     Status: Abnormal   Collection Time    11/07/12  8:28 AM      Result Value Range   Prothrombin Time 18.5 (*) 11.6 - 15.2 seconds   INR 1.59 (*) 0.00 - 1.49   No results found.  Review of Systems  All other systems reviewed and are negative.    Blood pressure 158/67, pulse 55, temperature 97.4 F (36.3 C), temperature source Oral, resp. rate 20, SpO2 98.00%. Physical Exam  Constitutional: He is oriented to person, place, and time. He appears well-developed and well-nourished. No distress.  HENT:  Head: Normocephalic and atraumatic.  Right Ear: External ear normal.  Left Ear: External ear normal.  Nose: Nose normal.  Mouth/Throat: Oropharynx is clear and moist.  Eyes: Conjunctivae and EOM are normal.  Pupils are equal, round, and reactive to light.  Neck: Normal range of motion. Neck supple.  Cardiovascular: Normal rate.   Respiratory: Effort normal.  GI:  Did not examine.  Genitourinary:  Did not examine.  Musculoskeletal: Normal range of motion.  Neurological: He is alert and oriented to person, place, and time. No cranial nerve deficit.  Skin: Skin is warm and dry.  Psychiatric: He has a normal mood and affect. His behavior is normal. Judgment and thought content normal.     Assessment/Plan Zenker's Diverticulum To OR for endoscopic Zenker's diverticulectomy.  Kawena Lyday 11/07/2012, 10:29 AM

## 2012-11-07 NOTE — Progress Notes (Signed)
Doing well.  Some sore throat but tolerating liquid po well.  Ambulating.  AF VSS.  Normal voice.  Neck normal.  A: S/P Zenker's diverticulotomy  P:  Observe overnight.  Liquid diet.

## 2012-11-07 NOTE — Anesthesia Preprocedure Evaluation (Addendum)
Anesthesia Evaluation  Patient identified by MRN, date of birth, ID band Patient awake    Reviewed: Allergy & Precautions, H&P , NPO status , Patient's Chart, lab work & pertinent test results  History of Anesthesia Complications Negative for: history of anesthetic complications  Airway Mallampati: II  Neck ROM: full    Dental   Pulmonary          Cardiovascular Exercise Tolerance: Good hypertension, Pt. on medications and Pt. on home beta blockers + CAD, + CABG and + Peripheral Vascular Disease + dysrhythmias Atrial Fibrillation  S/p MVr and CABG  Ef 55%   bilat ica stenosis 40%-59%     Neuro/Psych PSYCHIATRIC DISORDERS Anxiety  Neuromuscular disease    GI/Hepatic hiatal hernia, GERD-  ,zencker's divertic    Endo/Other    Renal/GU      Musculoskeletal   Abdominal   Peds  Hematology   Anesthesia Other Findings   Reproductive/Obstetrics                         Anesthesia Physical Anesthesia Plan  ASA: III  Anesthesia Plan: General   Post-op Pain Management:    Induction: Intravenous  Airway Management Planned: Oral ETT  Additional Equipment:   Intra-op Plan:   Post-operative Plan: Extubation in OR  Informed Consent: I have reviewed the patients History and Physical, chart, labs and discussed the procedure including the risks, benefits and alternatives for the proposed anesthesia with the patient or authorized representative who has indicated his/her understanding and acceptance.     Plan Discussed with: CRNA, Anesthesiologist and Surgeon  Anesthesia Plan Comments:         Anesthesia Quick Evaluation

## 2012-11-07 NOTE — OR Nursing (Signed)
Admits feels sleepy, throat is slighly sore....voice raspy. Sleeping unless disturbed.

## 2012-11-08 NOTE — Anesthesia Postprocedure Evaluation (Signed)
  Anesthesia Post-op Note  Patient: John [name]  Procedure(s) Performed: Procedure(s): ZENKER'S DIVERTICULECTOMY ENDOSCOPIC (N/A)  Patient Location: Nursing Unit  Anesthesia Type:General  Level of Consciousness: awake, alert , oriented and patient cooperative  Airway and Oxygen Therapy: Patient Spontanous Breathing  Post-op Pain: mild  Post-op Assessment: Post-op Vital signs reviewed  Post-op Vital Signs: stable  Complications: No apparent anesthesia complications

## 2012-11-08 NOTE — Progress Notes (Signed)
Patient discharged to home with wife.  Discharge instructions given including follow up care, medications and diet.  Verbalizes understanding with no further questions.  Vital signs stable.  Discharged with wife. 

## 2012-11-08 NOTE — Discharge Summary (Signed)
Physician Discharge Summary  [name] MRN: [id number] DOB/AGE: Aug 28, 1935 77 y.o.  Admit date: 11/07/2012 Discharge date: 11/08/2012  Admission Diagnoses: Dysphagia, Zenker's diverticulum  Discharge Diagnoses: Same   Discharged Condition: good  Hospital Course: 77 year old male underwent endoscopic Zenker's diverticulotomy.  For details, see the operative note.  Observed overnight after surgery starting a clear liquid diet.  No problems overnight and tolerated liquids well.  Pain controlled by Tylenol.  Stable for discharge.  Consults: None  Significant Diagnostic Studies: None  Treatments: surgery: Endoscopic Zenker's diverticulotomy  Discharge Exam: Blood pressure 142/49, pulse 55, temperature 98.1 F (36.7 C), temperature source Oral, resp. rate 17, height 5\' 9"  (1.753 m), weight 81.647 kg (180 lb), SpO2 94.00%. General appearance: alert, cooperative and no distress Neck: Normal mobility.  No crepitance.  Disposition: 01-Home or Self Care  Discharge Orders   Future Appointments Provider Department Dept Phone   11/09/2012 8:00 AM [facility] [phone]   Future Orders Complete By Expires     Diet - low sodium heart healthy  As directed     Discharge instructions  As directed     Comments:      Full liquid diet until Thursday then start soft solid diet.  Tylenol for pain.    Increase activity slowly  As directed         Medication List    TAKE these medications       amLODipine 10 MG tablet  Commonly known as:  NORVASC  Take 1/2 tablet by mouth daily     doxazosin 8 MG tablet  Commonly known as:  CARDURA  Take 1 tablet (8 mg total) by mouth at bedtime.     losartan 100 MG tablet  Commonly known as:  COZAAR  Take 1 tablet (100 mg total) by mouth daily.     metoprolol 50 MG tablet  Commonly known as:  LOPRESSOR  Take 1 tablet (50 mg total) by mouth 2 (two) times daily.     pantoprazole 40 MG  tablet  Commonly known as:  PROTONIX  Take 1 tablet (40 mg total) by mouth daily.     pravastatin 40 MG tablet  Commonly known as:  PRAVACHOL  Take 1 tablet (40 mg total) by mouth daily.     warfarin 2.5 MG tablet  Commonly known as:  COUMADIN  Take 1.25-2.5 mg by mouth daily. 1 tablet every other day on Monday and Thursday. All other days take 0.5 tablet           Follow-up Information   Follow up with Vondell Babers, MD. Schedule an appointment as soon as possible for a visit in 2 weeks.   Contact information:   [address] [place] [address] [phone]       Signed: Christia Reading 11/08/2012, 9:36 AM

## 2012-11-08 NOTE — Op Note (Signed)
NAMEKYRIN, GRATZ NO.:  [id number]  MEDICAL RECORD NO.:  [id number]  LOCATION:  [id number]                        FACILITY:  [facility]  PHYSICIAN:  Antony Contras, MD     DATE OF BIRTH:  07/09/1935  DATE OF PROCEDURE:  11/07/2012 DATE OF DISCHARGE:                              OPERATIVE REPORT   PREOPERATIVE DIAGNOSIS:  Zenker's diverticulum and dysphagia.  POSTOPERATIVE DIAGNOSIS:  Zenker's diverticulum and dysphagia.  PROCEDURE:  Endoscopic Zenker's diverticulectomy.  SURGEON:  Antony Contras, MD  ANESTHESIA:  General endotracheal anesthesia.  COMPLICATIONS:  None.  INDICATION:  The patient is a 77 year old male with a fairly long history of dysphagia and a known Zenker's diverticulum that has noticed progression of his dysphagia of late and is quite frustrated with it and presents to the operating room for surgical management.  FINDINGS:  The Zenker's diverticulum was identified through endoscopic exposure and the common wall between the esophagus and the diverticulum was divided.  DESCRIPTION OF PROCEDURE:  The patient was identified in the holding room and informed consent having been obtained including discussion of risks, benefits, and alternatives, the patient was brought to the operative suite and put on the operating table in supine position. Anesthesia was induced and the patient was intubated by the Anesthesia team without difficulty.  The eyes were taped closed and bed was turned 90 degrees from anesthesia.  A head wrap was placed around the patient's head and a tooth guard was placed over the upper teeth.  A Weerda bivalve scope was then placed into the pharynx and passed into the upper esophagus to identify the Zenker's diverticulum.  There was some difficulty to properly identifying the diverticulum.  So, the Weerda scope was removed and a cervical esophagoscope was placed and used to evaluate the upper esophagus.  The common wall was easily  identified with the esophagoscope.  The scope was removed and the Weerda scope was replaced again and put into proper position and opened with the upper valve in the esophagus and the lower valve in the diverticulum exposing the common wall.  This was placed in suspension on the Mayo stand.  The 0-degree telescope was used to make a photograph before the intervention.  The stapler was then passed through the [facility] scope and placed over the common wall using endoscopic visualization.  The stapler was closed and fully engaged and then opened and removed.  The common wall had was divided in this process and was then examined with 0-degree telescope.  There was little bit of common wall remaining inferiorly and so, a second staple past was planned.  At this time, in order to maximize placement of stapler, a 3-0 Vicryl suture was placed through the common wall on either side using an alligator forceps and was used to provide superior tension on the common wall while the stapler was again position.  While dealing with the endoscope, the stapler was engaged and then fully operated and then opened and removed.  The 0- degree telescope was then used to examine again to make a postoperative photograph. The Vicryl sutures were removed and the Weerda scope was taken out of suspension  and removed from the patient's mouth while suctioning the airway.  The tooth guard was removed and he was turned back to Anesthesia for wake up, and was extubated, moved to the recovery room in stable condition.     Antony Contras, MD     DDB/MEDQ  D:  11/07/2012  T:  11/08/2012  Job:  [id number]

## 2012-11-09 ENCOUNTER — Encounter (HOSPITAL_COMMUNITY): Payer: Self-pay | Admitting: Otolaryngology

## 2012-11-16 ENCOUNTER — Ambulatory Visit (INDEPENDENT_AMBULATORY_CARE_PROVIDER_SITE_OTHER): Payer: Medicare Other | Admitting: *Deleted

## 2012-11-16 DIAGNOSIS — I82409 Acute embolism and thrombosis of unspecified deep veins of unspecified lower extremity: Secondary | ICD-10-CM | POA: Diagnosis not present

## 2012-11-16 DIAGNOSIS — I2699 Other pulmonary embolism without acute cor pulmonale: Secondary | ICD-10-CM | POA: Diagnosis not present

## 2012-11-16 DIAGNOSIS — Z7901 Long term (current) use of anticoagulants: Secondary | ICD-10-CM

## 2012-11-16 LAB — POCT INR: INR: 1.9

## 2012-12-14 ENCOUNTER — Ambulatory Visit (INDEPENDENT_AMBULATORY_CARE_PROVIDER_SITE_OTHER): Payer: Medicare Other | Admitting: *Deleted

## 2012-12-14 DIAGNOSIS — I82409 Acute embolism and thrombosis of unspecified deep veins of unspecified lower extremity: Secondary | ICD-10-CM

## 2012-12-14 DIAGNOSIS — I2699 Other pulmonary embolism without acute cor pulmonale: Secondary | ICD-10-CM | POA: Diagnosis not present

## 2012-12-14 DIAGNOSIS — Z7901 Long term (current) use of anticoagulants: Secondary | ICD-10-CM

## 2012-12-14 LAB — POCT INR: INR: 2.7

## 2013-01-03 DIAGNOSIS — H6120 Impacted cerumen, unspecified ear: Secondary | ICD-10-CM | POA: Diagnosis not present

## 2013-01-03 DIAGNOSIS — H905 Unspecified sensorineural hearing loss: Secondary | ICD-10-CM | POA: Diagnosis not present

## 2013-01-11 ENCOUNTER — Ambulatory Visit (INDEPENDENT_AMBULATORY_CARE_PROVIDER_SITE_OTHER): Payer: Medicare Other | Admitting: *Deleted

## 2013-01-11 DIAGNOSIS — Z7901 Long term (current) use of anticoagulants: Secondary | ICD-10-CM

## 2013-01-11 DIAGNOSIS — I2699 Other pulmonary embolism without acute cor pulmonale: Secondary | ICD-10-CM | POA: Diagnosis not present

## 2013-01-11 DIAGNOSIS — I82409 Acute embolism and thrombosis of unspecified deep veins of unspecified lower extremity: Secondary | ICD-10-CM | POA: Diagnosis not present

## 2013-01-11 LAB — POCT INR: INR: 3.1

## 2013-02-08 ENCOUNTER — Ambulatory Visit (INDEPENDENT_AMBULATORY_CARE_PROVIDER_SITE_OTHER): Payer: Medicare Other | Admitting: *Deleted

## 2013-02-08 DIAGNOSIS — I2699 Other pulmonary embolism without acute cor pulmonale: Secondary | ICD-10-CM | POA: Diagnosis not present

## 2013-02-08 DIAGNOSIS — I82409 Acute embolism and thrombosis of unspecified deep veins of unspecified lower extremity: Secondary | ICD-10-CM

## 2013-02-08 DIAGNOSIS — Z7901 Long term (current) use of anticoagulants: Secondary | ICD-10-CM

## 2013-02-08 LAB — POCT INR: INR: 2.6

## 2013-03-07 ENCOUNTER — Ambulatory Visit (INDEPENDENT_AMBULATORY_CARE_PROVIDER_SITE_OTHER): Payer: Medicare Other | Admitting: *Deleted

## 2013-03-07 DIAGNOSIS — Z7901 Long term (current) use of anticoagulants: Secondary | ICD-10-CM

## 2013-03-07 DIAGNOSIS — I2699 Other pulmonary embolism without acute cor pulmonale: Secondary | ICD-10-CM | POA: Diagnosis not present

## 2013-03-07 DIAGNOSIS — I82409 Acute embolism and thrombosis of unspecified deep veins of unspecified lower extremity: Secondary | ICD-10-CM | POA: Diagnosis not present

## 2013-03-07 LAB — POCT INR: INR: 3.3

## 2013-04-05 ENCOUNTER — Ambulatory Visit (INDEPENDENT_AMBULATORY_CARE_PROVIDER_SITE_OTHER): Payer: Medicare Other | Admitting: *Deleted

## 2013-04-05 DIAGNOSIS — I2699 Other pulmonary embolism without acute cor pulmonale: Secondary | ICD-10-CM | POA: Diagnosis not present

## 2013-04-05 DIAGNOSIS — I82409 Acute embolism and thrombosis of unspecified deep veins of unspecified lower extremity: Secondary | ICD-10-CM | POA: Diagnosis not present

## 2013-04-05 DIAGNOSIS — Z7901 Long term (current) use of anticoagulants: Secondary | ICD-10-CM | POA: Diagnosis not present

## 2013-04-05 LAB — POCT INR: INR: 3.3

## 2013-04-19 ENCOUNTER — Ambulatory Visit (INDEPENDENT_AMBULATORY_CARE_PROVIDER_SITE_OTHER): Payer: Medicare Other | Admitting: *Deleted

## 2013-04-19 DIAGNOSIS — I2699 Other pulmonary embolism without acute cor pulmonale: Secondary | ICD-10-CM | POA: Diagnosis not present

## 2013-04-19 DIAGNOSIS — Z7901 Long term (current) use of anticoagulants: Secondary | ICD-10-CM | POA: Diagnosis not present

## 2013-04-19 DIAGNOSIS — I82409 Acute embolism and thrombosis of unspecified deep veins of unspecified lower extremity: Secondary | ICD-10-CM

## 2013-05-10 ENCOUNTER — Ambulatory Visit (INDEPENDENT_AMBULATORY_CARE_PROVIDER_SITE_OTHER): Payer: Medicare Other | Admitting: *Deleted

## 2013-05-10 DIAGNOSIS — I82409 Acute embolism and thrombosis of unspecified deep veins of unspecified lower extremity: Secondary | ICD-10-CM

## 2013-05-10 DIAGNOSIS — I2699 Other pulmonary embolism without acute cor pulmonale: Secondary | ICD-10-CM

## 2013-05-10 DIAGNOSIS — Z7901 Long term (current) use of anticoagulants: Secondary | ICD-10-CM

## 2013-05-15 DIAGNOSIS — H2510 Age-related nuclear cataract, unspecified eye: Secondary | ICD-10-CM | POA: Diagnosis not present

## 2013-05-15 DIAGNOSIS — H179 Unspecified corneal scar and opacity: Secondary | ICD-10-CM | POA: Diagnosis not present

## 2013-05-15 DIAGNOSIS — H5210 Myopia, unspecified eye: Secondary | ICD-10-CM | POA: Diagnosis not present

## 2013-05-23 DIAGNOSIS — Z23 Encounter for immunization: Secondary | ICD-10-CM | POA: Diagnosis not present

## 2013-06-07 ENCOUNTER — Ambulatory Visit (INDEPENDENT_AMBULATORY_CARE_PROVIDER_SITE_OTHER): Payer: Medicare Other | Admitting: *Deleted

## 2013-06-07 DIAGNOSIS — I82409 Acute embolism and thrombosis of unspecified deep veins of unspecified lower extremity: Secondary | ICD-10-CM | POA: Diagnosis not present

## 2013-06-07 DIAGNOSIS — Z7901 Long term (current) use of anticoagulants: Secondary | ICD-10-CM

## 2013-06-07 DIAGNOSIS — I2699 Other pulmonary embolism without acute cor pulmonale: Secondary | ICD-10-CM | POA: Diagnosis not present

## 2013-06-21 ENCOUNTER — Ambulatory Visit (INDEPENDENT_AMBULATORY_CARE_PROVIDER_SITE_OTHER): Payer: Medicare Other | Admitting: *Deleted

## 2013-06-21 DIAGNOSIS — I2699 Other pulmonary embolism without acute cor pulmonale: Secondary | ICD-10-CM

## 2013-06-21 DIAGNOSIS — Z7901 Long term (current) use of anticoagulants: Secondary | ICD-10-CM

## 2013-06-21 DIAGNOSIS — I82409 Acute embolism and thrombosis of unspecified deep veins of unspecified lower extremity: Secondary | ICD-10-CM

## 2013-06-21 DIAGNOSIS — Z5181 Encounter for therapeutic drug level monitoring: Secondary | ICD-10-CM | POA: Diagnosis not present

## 2013-07-10 DIAGNOSIS — H179 Unspecified corneal scar and opacity: Secondary | ICD-10-CM | POA: Diagnosis not present

## 2013-07-20 ENCOUNTER — Ambulatory Visit (INDEPENDENT_AMBULATORY_CARE_PROVIDER_SITE_OTHER): Payer: Medicare Other | Admitting: *Deleted

## 2013-07-20 DIAGNOSIS — I82409 Acute embolism and thrombosis of unspecified deep veins of unspecified lower extremity: Secondary | ICD-10-CM | POA: Diagnosis not present

## 2013-07-20 DIAGNOSIS — Z7901 Long term (current) use of anticoagulants: Secondary | ICD-10-CM | POA: Diagnosis not present

## 2013-07-20 DIAGNOSIS — Z5181 Encounter for therapeutic drug level monitoring: Secondary | ICD-10-CM

## 2013-07-20 DIAGNOSIS — I2699 Other pulmonary embolism without acute cor pulmonale: Secondary | ICD-10-CM | POA: Diagnosis not present

## 2013-07-20 LAB — POCT INR: INR: 1.9

## 2013-07-20 MED ORDER — WARFARIN SODIUM 2.5 MG PO TABS: 2.5000 mg | ORAL_TABLET | ORAL | Status: DC

## 2013-08-17 ENCOUNTER — Ambulatory Visit (INDEPENDENT_AMBULATORY_CARE_PROVIDER_SITE_OTHER): Payer: Medicare Other | Admitting: *Deleted

## 2013-08-17 DIAGNOSIS — I2699 Other pulmonary embolism without acute cor pulmonale: Secondary | ICD-10-CM

## 2013-08-17 DIAGNOSIS — Z7901 Long term (current) use of anticoagulants: Secondary | ICD-10-CM

## 2013-08-17 DIAGNOSIS — I82409 Acute embolism and thrombosis of unspecified deep veins of unspecified lower extremity: Secondary | ICD-10-CM

## 2013-08-17 LAB — POCT INR: INR: 1.9

## 2013-09-01 ENCOUNTER — Other Ambulatory Visit: Payer: Self-pay | Admitting: Pulmonary Disease

## 2013-09-13 ENCOUNTER — Telehealth: Payer: Self-pay | Admitting: Pulmonary Disease

## 2013-09-13 DIAGNOSIS — H903 Sensorineural hearing loss, bilateral: Secondary | ICD-10-CM | POA: Diagnosis not present

## 2013-09-13 MED ORDER — METOPROLOL TARTRATE 50 MG PO TABS: 50.0000 mg | ORAL_TABLET | Freq: Two times a day (BID) | ORAL | Status: DC

## 2013-09-13 MED ORDER — DOXAZOSIN MESYLATE 8 MG PO TABS: 8.0000 mg | ORAL_TABLET | Freq: Every day | ORAL | Status: DC

## 2013-09-13 MED ORDER — AMLODIPINE BESYLATE 10 MG PO TABS: ORAL_TABLET | ORAL | Status: DC

## 2013-09-13 MED ORDER — LOSARTAN POTASSIUM 100 MG PO TABS: 100.0000 mg | ORAL_TABLET | Freq: Every day | ORAL | Status: DC

## 2013-09-13 NOTE — Telephone Encounter (Signed)
Spoke with pt. Confirmed medications needed to be sent. Amlodopine, metoprolol, losartan, doxasozin to [facility]. This has been sent. Nothing further needed

## 2013-09-14 ENCOUNTER — Ambulatory Visit (INDEPENDENT_AMBULATORY_CARE_PROVIDER_SITE_OTHER): Payer: Medicare Other | Admitting: *Deleted

## 2013-09-14 DIAGNOSIS — I82409 Acute embolism and thrombosis of unspecified deep veins of unspecified lower extremity: Secondary | ICD-10-CM | POA: Diagnosis not present

## 2013-09-14 DIAGNOSIS — I2699 Other pulmonary embolism without acute cor pulmonale: Secondary | ICD-10-CM

## 2013-09-14 DIAGNOSIS — Z5181 Encounter for therapeutic drug level monitoring: Secondary | ICD-10-CM | POA: Diagnosis not present

## 2013-09-14 DIAGNOSIS — Z7901 Long term (current) use of anticoagulants: Secondary | ICD-10-CM

## 2013-09-14 LAB — POCT INR: INR: 2.2

## 2013-09-19 ENCOUNTER — Other Ambulatory Visit: Payer: Self-pay

## 2013-09-19 DIAGNOSIS — L678 Other hair color and hair shaft abnormalities: Secondary | ICD-10-CM | POA: Diagnosis not present

## 2013-09-19 DIAGNOSIS — D235 Other benign neoplasm of skin of trunk: Secondary | ICD-10-CM | POA: Diagnosis not present

## 2013-09-19 DIAGNOSIS — L738 Other specified follicular disorders: Secondary | ICD-10-CM | POA: Diagnosis not present

## 2013-09-19 DIAGNOSIS — Z85828 Personal history of other malignant neoplasm of skin: Secondary | ICD-10-CM | POA: Diagnosis not present

## 2013-09-19 DIAGNOSIS — L820 Inflamed seborrheic keratosis: Secondary | ICD-10-CM | POA: Diagnosis not present

## 2013-09-19 DIAGNOSIS — L821 Other seborrheic keratosis: Secondary | ICD-10-CM | POA: Diagnosis not present

## 2013-09-19 DIAGNOSIS — L723 Sebaceous cyst: Secondary | ICD-10-CM | POA: Diagnosis not present

## 2013-09-20 DIAGNOSIS — H903 Sensorineural hearing loss, bilateral: Secondary | ICD-10-CM | POA: Diagnosis not present

## 2013-10-04 ENCOUNTER — Ambulatory Visit (INDEPENDENT_AMBULATORY_CARE_PROVIDER_SITE_OTHER): Payer: Medicare Other | Admitting: Pulmonary Disease

## 2013-10-04 ENCOUNTER — Encounter: Payer: Self-pay | Admitting: Pulmonary Disease

## 2013-10-04 ENCOUNTER — Other Ambulatory Visit (INDEPENDENT_AMBULATORY_CARE_PROVIDER_SITE_OTHER): Payer: Medicare Other

## 2013-10-04 VITALS — BP 140/62 | HR 55 | Temp 97.5°F | Ht 69.0 in | Wt 191.6 lb

## 2013-10-04 DIAGNOSIS — I10 Essential (primary) hypertension: Secondary | ICD-10-CM

## 2013-10-04 DIAGNOSIS — I4891 Unspecified atrial fibrillation: Secondary | ICD-10-CM | POA: Diagnosis not present

## 2013-10-04 DIAGNOSIS — I6529 Occlusion and stenosis of unspecified carotid artery: Secondary | ICD-10-CM

## 2013-10-04 DIAGNOSIS — R490 Dysphonia: Secondary | ICD-10-CM

## 2013-10-04 DIAGNOSIS — C61 Malignant neoplasm of prostate: Secondary | ICD-10-CM

## 2013-10-04 DIAGNOSIS — I82409 Acute embolism and thrombosis of unspecified deep veins of unspecified lower extremity: Secondary | ICD-10-CM | POA: Diagnosis not present

## 2013-10-04 DIAGNOSIS — K635 Polyp of colon: Secondary | ICD-10-CM

## 2013-10-04 DIAGNOSIS — E785 Hyperlipidemia, unspecified: Secondary | ICD-10-CM

## 2013-10-04 DIAGNOSIS — M1990 Unspecified osteoarthritis, unspecified site: Secondary | ICD-10-CM

## 2013-10-04 DIAGNOSIS — K219 Gastro-esophageal reflux disease without esophagitis: Secondary | ICD-10-CM

## 2013-10-04 DIAGNOSIS — Z8719 Personal history of other diseases of the digestive system: Secondary | ICD-10-CM

## 2013-10-04 DIAGNOSIS — I2699 Other pulmonary embolism without acute cor pulmonale: Secondary | ICD-10-CM

## 2013-10-04 DIAGNOSIS — Z9889 Other specified postprocedural states: Secondary | ICD-10-CM

## 2013-10-04 DIAGNOSIS — I059 Rheumatic mitral valve disease, unspecified: Secondary | ICD-10-CM

## 2013-10-04 DIAGNOSIS — I2510 Atherosclerotic heart disease of native coronary artery without angina pectoris: Secondary | ICD-10-CM

## 2013-10-04 LAB — CBC WITH DIFFERENTIAL/PLATELET
BASOS ABS: 0 10*3/uL (ref 0.0–0.1)
Basophils Relative: 0.4 % (ref 0.0–3.0)
EOS ABS: 0.1 10*3/uL (ref 0.0–0.7)
Eosinophils Relative: 2.7 % (ref 0.0–5.0)
HEMATOCRIT: 41.2 % (ref 39.0–52.0)
HEMOGLOBIN: 13.9 g/dL (ref 13.0–17.0)
LYMPHS ABS: 1.8 10*3/uL (ref 0.7–4.0)
Lymphocytes Relative: 33 % (ref 12.0–46.0)
MCHC: 33.7 g/dL (ref 30.0–36.0)
MCV: 85.3 fl (ref 78.0–100.0)
Monocytes Absolute: 0.4 10*3/uL (ref 0.1–1.0)
Monocytes Relative: 8.1 % (ref 3.0–12.0)
NEUTROS ABS: 3 10*3/uL (ref 1.4–7.7)
Neutrophils Relative %: 55.8 % (ref 43.0–77.0)
Platelets: 167 10*3/uL (ref 150.0–400.0)
RBC: 4.83 Mil/uL (ref 4.22–5.81)
RDW: 14.4 % (ref 11.5–14.6)
WBC: 5.3 10*3/uL (ref 4.5–10.5)

## 2013-10-04 LAB — BASIC METABOLIC PANEL
BUN: 21 mg/dL (ref 6–23)
CALCIUM: 9.3 mg/dL (ref 8.4–10.5)
CO2: 28 mEq/L (ref 19–32)
Chloride: 106 mEq/L (ref 96–112)
Creatinine, Ser: 1.1 mg/dL (ref 0.4–1.5)
GFR: 71.52 mL/min (ref >60.00)
Glucose, Bld: 104 mg/dL — ABNORMAL HIGH (ref 70–99)
Potassium: 5 mEq/L (ref 3.5–5.1)
SODIUM: 138 meq/L (ref 135–145)

## 2013-10-04 LAB — LIPID PANEL
Cholesterol: 312 mg/dL — ABNORMAL HIGH (ref 0–200)
HDL: 54.6 mg/dL (ref >39.00)
LDL Cholesterol: 232 mg/dL — ABNORMAL HIGH (ref 0–99)
Total CHOL/HDL Ratio: 6
Triglycerides: 125 mg/dL (ref 0.0–149.0)
VLDL: 25 mg/dL (ref 0.0–40.0)

## 2013-10-04 LAB — HEPATIC FUNCTION PANEL
ALK PHOS: 118 U/L — AB (ref 39–117)
ALT: 24 U/L (ref 0–53)
AST: 22 U/L (ref 0–37)
Albumin: 4.3 g/dL (ref 3.5–5.2)
Bilirubin, Direct: 0.1 mg/dL (ref 0.0–0.3)
TOTAL PROTEIN: 6.9 g/dL (ref 6.0–8.3)
Total Bilirubin: 0.9 mg/dL (ref 0.3–1.2)

## 2013-10-04 LAB — TSH: TSH: 4.01 u[IU]/mL (ref 0.35–5.50)

## 2013-10-04 LAB — PSA: PSA: 0.09 ng/mL — ABNORMAL LOW (ref 0.10–4.00)

## 2013-10-04 MED ORDER — PANTOPRAZOLE SODIUM 40 MG PO TBEC: 40.0000 mg | DELAYED_RELEASE_TABLET | Freq: Every day | ORAL | Status: DC

## 2013-10-04 NOTE — Patient Instructions (Signed)
Today we updated your med list in our EPIC system...    Continue your current medications the same...    We refilled the meds you requested...  Today we did your follow up CXR & FASTING blood work...    We will contact you w/ the results when available...   We will arrange for an ENT follow up w/ DrBates...  For your cough/ phlegm/ drainage>>    Try an antihist like ALLEGRA 19=80mg  daily...    Use a Saline nasal mist to clear out the pollen this spring...    Take the OTC MUCINEX 600mg  tabs- 2 tabs twice daily w/ fluids...  Call for any questions.Marland KitchenMarland Kitchen
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
## 2013-10-05 ENCOUNTER — Other Ambulatory Visit: Payer: Self-pay | Admitting: Pulmonary Disease

## 2013-10-05 DIAGNOSIS — E785 Hyperlipidemia, unspecified: Secondary | ICD-10-CM

## 2013-10-10 ENCOUNTER — Ambulatory Visit (INDEPENDENT_AMBULATORY_CARE_PROVIDER_SITE_OTHER): Payer: Medicare Other | Admitting: Pharmacist

## 2013-10-10 VITALS — Wt 188.0 lb

## 2013-10-10 DIAGNOSIS — E785 Hyperlipidemia, unspecified: Secondary | ICD-10-CM | POA: Diagnosis not present

## 2013-10-10 DIAGNOSIS — Z79899 Other long term (current) drug therapy: Secondary | ICD-10-CM

## 2013-10-10 DIAGNOSIS — I6529 Occlusion and stenosis of unspecified carotid artery: Secondary | ICD-10-CM

## 2013-10-10 MED ORDER — PRAVASTATIN SODIUM 40 MG PO TABS: 20.0000 mg | ORAL_TABLET | Freq: Every day | ORAL | Status: DC

## 2013-10-10 MED ORDER — CO Q-10 100 MG PO CAPS: 200.0000 mg | ORAL_CAPSULE | Freq: Every day | ORAL | Status: DC

## 2013-10-10 MED ORDER — EZETIMIBE 10 MG PO TABS: 10.0000 mg | ORAL_TABLET | Freq: Every day | ORAL | Status: DC

## 2013-10-10 NOTE — Progress Notes (Signed)
Patient is a 78 y.o. WM referred to lipid clinic by Dr. Lenna Gilford.  Patient has a h/o CABG 2009 and has failed multiple statins including lipitor, zocor, and Crestor daily.  He has been on pravastatin 40 mg qd  for a few years, but stopped it for a few weeks recently due to joint aches.  His LDL was ~ 110 mg/dL on pravastatin 40 mg qd, however most recently LDL went up to 232 mg/dL when he was off his pravastatin due to muscle aches.  He has since restarted his pravastatin 40 mg qd, and does state he is having some joint stiffness again.  Back in 06/2011, he got LDL down to 77 mg/dL on combination of pravastatin 40 mg qd + Zetia 10 mg qd.  Zetia was stopped as there was an unknown benefit using Zetia.  Since then the IMPROVE-IT trial has shown zetia + statin was superior to statin monotherapy.    RF:  CABG (2009), HTN, age - LDL goal < 70 if possible.  LDL goal of at least < 100 given baseline LDL > 200 mg/dL. Meds:  Pravastatin 40 mg - back on for a few days.  Joints are a little stiff since restarting. Intolerant:  Lipitor, Crestor, Zocor (all caused muscle aches).  Niaspan (flushing).  Took Welchol in past but stopped due to lack of potency and cost.  Diet:  Breakfast - cereal and coffee.  Lunch is typically a soup and sandwich at home.  Dinner is typically meat and vegetable.  He does eat red meat 3-4 times a week.  Rarely eats fried foods.  Doesn't drink soda or tea.  Rarely eats eggs. Exercise:  Due to knee issues, he is no longer able to do significant aerobic activity.  May need surgery on knee in the future. Social history:  Non-smoker.  Rarely drinks alcohol.  Family history:  Brother and father both have h/o CAD.  Labs: 10/2013:  TC 312, LDL 232, TG 125, HDL 55, Glucose 104, Scr 1.1, LFTs normal, TSH 4.0 (not on lipid lowering therapy at time of labs) 09/2012:  LDL 107, TC 177 (pravastatin 40 mg qd) 06/2011:  LDL 77 (pravastatin 40 mg qd + Zetia 10 mg qd)  Current Outpatient Prescriptions   Medication Sig Dispense Refill  . amLODipine (NORVASC) 10 MG tablet Take 10 mg by mouth daily.      Marland Kitchen doxazosin (CARDURA) 8 MG tablet Take 1 tablet (8 mg total) by mouth at bedtime.  90 tablet  1  . losartan (COZAAR) 100 MG tablet Take 1 tablet (100 mg total) by mouth daily.  90 tablet  1  . metoprolol (LOPRESSOR) 50 MG tablet Take 1 tablet (50 mg total) by mouth 2 (two) times daily.  180 tablet  1  . pantoprazole (PROTONIX) 40 MG tablet Take 1 tablet (40 mg total) by mouth daily.  90 tablet  3  . pravastatin (PRAVACHOL) 40 MG tablet Take 1 tablet (40 mg total) by mouth daily.  90 tablet  3  . warfarin (COUMADIN) 2.5 MG tablet Take 1 tablet (2.5 mg total) by mouth as directed.  90 tablet  1   No current facility-administered medications for this visit.   Allergies  Allergen Reactions  . Iodine   . Iohexol      Code: HIVES, Desc: PT ALLERGIC TO IV CONTRAST;(REACTION DURING CARDIAC CATHETERIZATION); NEEDS PREMEDS PER DR CRENSHAW!, Onset Date: [id number]   . Statins     Joint aches with Zocor, Lipitor, Crestor  Family History  Problem Relation Age of Onset  . Heart attack Father   . Stroke Mother   . Hypertension Mother   . Hypertension Brother   . Heart disease Brother   . Prostate cancer Brother

## 2013-10-10 NOTE — Patient Instructions (Signed)
1.  Start taking Co-Enzyme Q-10 at dose of 200 mg once daily. 2.  Reduce pravastatin to 20 mg daily (take 1/2 of the 40 mg tablet).  Take in the evening. 3.  Four weeks later, start Zetia 10 mg once daily.  Take anytime of the day. 4.  Recheck cholesterol / liver in 3 months (01/15/14 fasting labs), and see Ysidro Evert the following day to review (01/16/14 - 2:00 pm)

## 2013-10-10 NOTE — Assessment & Plan Note (Signed)
Given he is still getting joint stiffness on pravastatin 40 mg qd, and he got LDL down to ~ 80 mg/dL on pravastatin + zetia in the past, will have patient reduce pravastatin to 20 mg qd, and add Zetia back to regimen.  Given results of IMPROVE-IT showed addition of Zetia further reduced incidence of major CV outcomes, will use combination therapy with statin/zetia.  His LDL seems to drop > 50% with statin, so hopefully low dose statin/zetia can get LDL to 100 mg/dL or less again.  He will add Co-Q 10 as well in hopes of preventing possible joint/muscle aches from statin.  If this fails, may have to consider Zetia + once or twice weekly Crestor.  Patient not candidate for PCSK-9 inhibitor study given CABG > 5 years ago.  Patient will also try to reduce his red meat intake.  Will recheck blood work in 3 months, and see me the following day. Plan: 1. Start taking Co-Enzyme Q-10 at dose of 200 mg once daily.  2. Reduce pravastatin to 20 mg daily (take 1/2 of the 40 mg tablet). Take in the evening.  3. Four weeks later, start Zetia 10 mg once daily. Take anytime of the day.  4. Recheck cholesterol / liver in 3 months (01/15/14 fasting labs), and see Ysidro Evert the following day to review (01/16/14 - 2:00 pm)

## 2013-10-17 DIAGNOSIS — R490 Dysphonia: Secondary | ICD-10-CM | POA: Diagnosis not present

## 2013-10-17 DIAGNOSIS — J387 Other diseases of larynx: Secondary | ICD-10-CM | POA: Diagnosis not present

## 2013-10-17 DIAGNOSIS — R1314 Dysphagia, pharyngoesophageal phase: Secondary | ICD-10-CM | POA: Diagnosis not present

## 2013-10-18 ENCOUNTER — Ambulatory Visit (INDEPENDENT_AMBULATORY_CARE_PROVIDER_SITE_OTHER): Payer: Medicare Other | Admitting: *Deleted

## 2013-10-18 DIAGNOSIS — Z5181 Encounter for therapeutic drug level monitoring: Secondary | ICD-10-CM

## 2013-10-18 DIAGNOSIS — I2699 Other pulmonary embolism without acute cor pulmonale: Secondary | ICD-10-CM

## 2013-10-18 DIAGNOSIS — Z7901 Long term (current) use of anticoagulants: Secondary | ICD-10-CM

## 2013-10-18 DIAGNOSIS — I82409 Acute embolism and thrombosis of unspecified deep veins of unspecified lower extremity: Secondary | ICD-10-CM | POA: Diagnosis not present

## 2013-10-18 LAB — POCT INR: INR: 2.3

## 2013-10-19 ENCOUNTER — Ambulatory Visit: Payer: Medicare Other | Admitting: Cardiology

## 2013-10-23 ENCOUNTER — Ambulatory Visit: Payer: Medicare Other | Admitting: Cardiology

## 2013-10-30 DIAGNOSIS — H179 Unspecified corneal scar and opacity: Secondary | ICD-10-CM | POA: Diagnosis not present

## 2013-11-15 ENCOUNTER — Ambulatory Visit (INDEPENDENT_AMBULATORY_CARE_PROVIDER_SITE_OTHER): Payer: Medicare Other | Admitting: *Deleted

## 2013-11-15 DIAGNOSIS — I82409 Acute embolism and thrombosis of unspecified deep veins of unspecified lower extremity: Secondary | ICD-10-CM | POA: Diagnosis not present

## 2013-11-15 DIAGNOSIS — I2699 Other pulmonary embolism without acute cor pulmonale: Secondary | ICD-10-CM

## 2013-11-15 DIAGNOSIS — Z5181 Encounter for therapeutic drug level monitoring: Secondary | ICD-10-CM | POA: Diagnosis not present

## 2013-11-15 DIAGNOSIS — Z7901 Long term (current) use of anticoagulants: Secondary | ICD-10-CM

## 2013-11-15 LAB — POCT INR: INR: 2.9

## 2013-11-17 ENCOUNTER — Other Ambulatory Visit: Payer: Self-pay | Admitting: Pulmonary Disease

## 2013-12-11 DIAGNOSIS — R490 Dysphonia: Secondary | ICD-10-CM | POA: Diagnosis not present

## 2013-12-11 DIAGNOSIS — J387 Other diseases of larynx: Secondary | ICD-10-CM | POA: Diagnosis not present

## 2013-12-13 ENCOUNTER — Ambulatory Visit (INDEPENDENT_AMBULATORY_CARE_PROVIDER_SITE_OTHER): Payer: Medicare Other | Admitting: *Deleted

## 2013-12-13 DIAGNOSIS — Z5181 Encounter for therapeutic drug level monitoring: Secondary | ICD-10-CM | POA: Diagnosis not present

## 2013-12-13 DIAGNOSIS — I2699 Other pulmonary embolism without acute cor pulmonale: Secondary | ICD-10-CM

## 2013-12-13 DIAGNOSIS — I82409 Acute embolism and thrombosis of unspecified deep veins of unspecified lower extremity: Secondary | ICD-10-CM | POA: Diagnosis not present

## 2013-12-13 DIAGNOSIS — Z7901 Long term (current) use of anticoagulants: Secondary | ICD-10-CM

## 2013-12-13 LAB — POCT INR: INR: 3.3

## 2013-12-26 ENCOUNTER — Other Ambulatory Visit: Payer: Self-pay | Admitting: Emergency Medicine

## 2013-12-29 ENCOUNTER — Ambulatory Visit (INDEPENDENT_AMBULATORY_CARE_PROVIDER_SITE_OTHER): Payer: Medicare Other

## 2013-12-29 DIAGNOSIS — I82409 Acute embolism and thrombosis of unspecified deep veins of unspecified lower extremity: Secondary | ICD-10-CM

## 2013-12-29 DIAGNOSIS — Z7901 Long term (current) use of anticoagulants: Secondary | ICD-10-CM

## 2013-12-29 DIAGNOSIS — I2699 Other pulmonary embolism without acute cor pulmonale: Secondary | ICD-10-CM | POA: Diagnosis not present

## 2013-12-29 DIAGNOSIS — Z5181 Encounter for therapeutic drug level monitoring: Secondary | ICD-10-CM

## 2013-12-29 LAB — POCT INR: INR: 3.3

## 2014-01-15 ENCOUNTER — Other Ambulatory Visit (INDEPENDENT_AMBULATORY_CARE_PROVIDER_SITE_OTHER): Payer: Medicare Other

## 2014-01-15 DIAGNOSIS — Z79899 Other long term (current) drug therapy: Secondary | ICD-10-CM

## 2014-01-15 DIAGNOSIS — E785 Hyperlipidemia, unspecified: Secondary | ICD-10-CM

## 2014-01-15 LAB — LIPID PANEL
CHOLESTEROL: 145 mg/dL (ref 0–200)
HDL: 51.8 mg/dL (ref >39.00)
LDL CALC: 78 mg/dL (ref 0–99)
NonHDL: 93.2
Total CHOL/HDL Ratio: 3
Triglycerides: 76 mg/dL (ref 0.0–149.0)
VLDL: 15.2 mg/dL (ref 0.0–40.0)

## 2014-01-15 LAB — HEPATIC FUNCTION PANEL
ALBUMIN: 4 g/dL (ref 3.5–5.2)
ALT: 21 U/L (ref 0–53)
AST: 23 U/L (ref 0–37)
Alkaline Phosphatase: 95 U/L (ref 39–117)
BILIRUBIN TOTAL: 0.5 mg/dL (ref 0.2–1.2)
Bilirubin, Direct: 0.1 mg/dL (ref 0.0–0.3)
TOTAL PROTEIN: 6.8 g/dL (ref 6.0–8.3)

## 2014-01-16 ENCOUNTER — Encounter: Payer: Self-pay | Admitting: *Deleted

## 2014-01-16 ENCOUNTER — Ambulatory Visit (INDEPENDENT_AMBULATORY_CARE_PROVIDER_SITE_OTHER): Payer: Medicare Other | Admitting: Cardiology

## 2014-01-16 ENCOUNTER — Ambulatory Visit (INDEPENDENT_AMBULATORY_CARE_PROVIDER_SITE_OTHER): Payer: Medicare Other | Admitting: Pharmacist

## 2014-01-16 ENCOUNTER — Encounter: Payer: Self-pay | Admitting: Cardiology

## 2014-01-16 ENCOUNTER — Ambulatory Visit (INDEPENDENT_AMBULATORY_CARE_PROVIDER_SITE_OTHER): Payer: Medicare Other

## 2014-01-16 VITALS — Wt 187.0 lb

## 2014-01-16 VITALS — BP 150/68 | HR 53 | Ht 69.0 in | Wt 186.6 lb

## 2014-01-16 DIAGNOSIS — E785 Hyperlipidemia, unspecified: Secondary | ICD-10-CM

## 2014-01-16 DIAGNOSIS — I2699 Other pulmonary embolism without acute cor pulmonale: Secondary | ICD-10-CM | POA: Diagnosis not present

## 2014-01-16 DIAGNOSIS — I82409 Acute embolism and thrombosis of unspecified deep veins of unspecified lower extremity: Secondary | ICD-10-CM

## 2014-01-16 DIAGNOSIS — I10 Essential (primary) hypertension: Secondary | ICD-10-CM | POA: Diagnosis not present

## 2014-01-16 DIAGNOSIS — I480 Paroxysmal atrial fibrillation: Secondary | ICD-10-CM

## 2014-01-16 DIAGNOSIS — I2510 Atherosclerotic heart disease of native coronary artery without angina pectoris: Secondary | ICD-10-CM | POA: Diagnosis not present

## 2014-01-16 DIAGNOSIS — Z5181 Encounter for therapeutic drug level monitoring: Secondary | ICD-10-CM | POA: Diagnosis not present

## 2014-01-16 DIAGNOSIS — I6529 Occlusion and stenosis of unspecified carotid artery: Secondary | ICD-10-CM

## 2014-01-16 DIAGNOSIS — Z79899 Other long term (current) drug therapy: Secondary | ICD-10-CM

## 2014-01-16 DIAGNOSIS — I2584 Coronary atherosclerosis due to calcified coronary lesion: Secondary | ICD-10-CM

## 2014-01-16 DIAGNOSIS — I4891 Unspecified atrial fibrillation: Secondary | ICD-10-CM | POA: Diagnosis not present

## 2014-01-16 DIAGNOSIS — Z7901 Long term (current) use of anticoagulants: Secondary | ICD-10-CM | POA: Diagnosis not present

## 2014-01-16 LAB — POCT INR: INR: 2.4

## 2014-01-16 NOTE — Progress Notes (Signed)
Patient is a 78 y.o. WM here for f/u of his hyperlipidemia. He has a baseline LDL of > 230 m/gd.  Patient started low dose pravastatin 20 mg + Zetia 10 mg qd three months ago.  He also started taking Co-Enzyme Q 10 given his h/o joint aches on multiple statins in the past, including pravastatin 40 mg qd.  Patient has a h/o CABG 2009.  He had been on pravastatin 40 mg qd  for a few years, but stopped it for a few weeks recently due to joint aches.  Restarted at just pravastatin 20 mg qd, and added Zetia as well given LDL well above 100 mg/dL on pravastatin monotherapy.   Zetia was used a few years ago, but was stopped at that time as there was an unknown benefit using Zetia.  Since then the IMPROVE-IT trial has shown zetia + statin was superior to statin monotherapy, and Zetia was restarted in patient.    RF:  CABG (2009), HTN, age - LDL goal < 70 if possible.  LDL goal of at least < 100 given baseline LDL > 200 mg/dL. Meds:  Pravastatin 40 mg - 1/2 tablets daily (20 mg qd), Zetia 10 mg qd Intolerant:  Lipitor, Crestor, Zocor (all caused muscle aches), pravastatin 40 mg qd (joint pain).  Niaspan (flushing).  Took Welchol in past but stopped due to lack of potency and cost.  Diet:  Breakfast - cereal and coffee.  Lunch is typically a soup and sandwich at home.  Dinner is typically meat and vegetable.  He does eat red meat 3-4 times a week.  Rarely eats fried foods.  Doesn't drink soda or tea.  Rarely eats eggs. Exercise:  Due to knee issues, he is no longer able to do significant aerobic activity on a regular basis. Social history:  Non-smoker.  Rarely drinks alcohol.  Family history:  Brother and father both have h/o CAD.  Labs: 01/2014:  TC 145, LDL 78, TG 76, HDL 52, LFTs normal (pravastatin 20 mg qd + Zetia 10 mg qd) 10/2013:  TC 312, LDL 232, TG 125, HDL 55, Glucose 104, Scr 1.1, LFTs normal, TSH 4.0 (not on lipid lowering therapy at time of labs) 09/2012:  LDL 107, TC 177 (pravastatin 40 mg  qd) 06/2011:  LDL 77 (pravastatin 40 mg qd + Zetia 10 mg qd)  Current Outpatient Prescriptions  Medication Sig Dispense Refill  . amLODipine (NORVASC) 10 MG tablet Take one-half tablet by  mouth daily  30 tablet  0  . Coenzyme Q10 (CO Q-10) 100 MG CAPS Take 200 mg by mouth daily.    0  . doxazosin (CARDURA) 8 MG tablet Take 1 tablet (8 mg total)  by mouth at bedtime.  30 tablet  0  . ezetimibe (ZETIA) 10 MG tablet Take 1 tablet (10 mg total) by mouth daily.  90 tablet  3  . losartan (COZAAR) 100 MG tablet Take 1 tablet by mouth  daily  30 tablet  0  . metoprolol (LOPRESSOR) 50 MG tablet Take 1 tablet by mouth two  times daily  30 tablet  0  . pantoprazole (PROTONIX) 40 MG tablet Take 1 tablet (40 mg total) by mouth daily.  90 tablet  3  . pravastatin (PRAVACHOL) 40 MG tablet Take 0.5 tablets (20 mg total) by mouth daily.  90 tablet  3  . warfarin (COUMADIN) 2.5 MG tablet Take 1 tablet (2.5 mg total) by mouth as directed.  90 tablet  1   No  current facility-administered medications for this visit.   Allergies  Allergen Reactions  . Iodine   . Iohexol      Code: HIVES, Desc: PT ALLERGIC TO IV CONTRAST;(REACTION DURING CARDIAC CATHETERIZATION); NEEDS PREMEDS PER DR CRENSHAW!, Onset Date: [id number]   . Statins     Joint aches with Zocor, Lipitor, Crestor   Family History  Problem Relation Age of Onset  . Heart attack Father   . Stroke Mother   . Hypertension Mother   . Hypertension Brother   . Heart disease Brother   . Prostate cancer Brother

## 2014-01-16 NOTE — Assessment & Plan Note (Signed)
Continue statin; fu carotid dopplers 10/15.

## 2014-01-16 NOTE — Patient Instructions (Signed)
1.  Continue pravastatin 40 mg - 1/2 tablet daily.  Continue Zetia 10 mg qd.   2.  Recheck cholesterol in 6 months (07/16/14 fasting labs), and see Ysidro Evert 07/17/14 at 2:30 pm

## 2014-01-16 NOTE — Assessment & Plan Note (Signed)
Continue present meds; LDL 78.

## 2014-01-16 NOTE — Assessment & Plan Note (Addendum)
Excellent improvement in lipid panel since starting pravastatin at lower dose (20 mg qd) and starting Zetia 10 mg qd.  He is tolerating regimen well, and LDL has dropped from 232 mg/dL down to 78 mg/dL. Will not make any changes today, but will recheck lipid / liver again in 6 months given his h/o high baseline LDL and h/o intolerance to statins.  Patient agrees to call if he has any problems.  If he does develop joint aches again, could consider Zetia 5 mg qd. Plan: 1.  Continue pravastatin 40 mg - 1/2 tablet daily.  Continue Zetia 10 mg qd.   2.  Recheck cholesterol in 6 months (07/16/14 fasting labs), and see Ysidro Evert 07/17/14 at 2:30 pm

## 2014-01-16 NOTE — Assessment & Plan Note (Signed)
S/p MV repair; continue SBE prophylaxis.

## 2014-01-16 NOTE — Assessment & Plan Note (Signed)
BP mildly elevated but typically controlled at home; continue present meds and follow; increase norvasc if needed.

## 2014-01-16 NOTE — Patient Instructions (Signed)

## 2014-01-16 NOTE — Progress Notes (Signed)
HPI: FU coronary artery disease status post coronary bypassing graft, as well as mitral valve repair in January 2009. The patient had an Myoview performed on July 12, 2008. This was normal with an ejection fraction of 66%. Last echocardiogram in October of 2013 showed normal LV function, status post mitral valve repair with normal gradients and trace mitral regurgitation. There was mild left atrial enlargement. Last carotid Dopplers performed in October 2013 showed 40-59% left and 0-39% right stenosis; fu recommended in 2 years. I last saw him 4/14. Since then the patient denies any dyspnea on exertion, orthopnea, PND, pedal edema, palpitations, syncope or chest pain.   Current Outpatient Prescriptions  Medication Sig Dispense Refill  . amLODipine (NORVASC) 10 MG tablet Take 10 mg by mouth daily.      Marland Kitchen amLODipine (NORVASC) 10 MG tablet Take one-half tablet by  mouth daily  30 tablet  0  . Coenzyme Q10 (CO Q-10) 100 MG CAPS Take 200 mg by mouth daily.    0  . doxazosin (CARDURA) 8 MG tablet Take 1 tablet (8 mg total)  by mouth at bedtime.  30 tablet  0  . ezetimibe (ZETIA) 10 MG tablet Take 1 tablet (10 mg total) by mouth daily.  90 tablet  3  . losartan (COZAAR) 100 MG tablet Take 1 tablet by mouth  daily  30 tablet  0  . metoprolol (LOPRESSOR) 50 MG tablet Take 1 tablet by mouth two  times daily  30 tablet  0  . pantoprazole (PROTONIX) 40 MG tablet Take 1 tablet (40 mg total) by mouth daily.  90 tablet  3  . pravastatin (PRAVACHOL) 40 MG tablet Take 0.5 tablets (20 mg total) by mouth daily.  90 tablet  3  . warfarin (COUMADIN) 2.5 MG tablet Take 1 tablet (2.5 mg total) by mouth as directed.  90 tablet  1   No current facility-administered medications for this visit.     Past Medical History  Diagnosis Date  . PE (pulmonary embolism)   . Deep vein thrombophlebitis of leg   . Essential hypertension, benign   . CAD (coronary artery disease)     s/p CABG  . Carotid stenosis   .  Hyperlipidemia   . GERD (gastroesophageal reflux disease)   . Hemorrhoids   . Prostate cancer   . DJD (degenerative joint disease)   . Sebaceous cyst     neck  . Seborrheic dermatitis   . Anxiety   . Hiatal hernia   . Tubular adenoma of colon 05/2011  . Complication of anesthesia     zenkers  diverticulum    Past Surgical History  Procedure Laterality Date  . Tandem and ovoid insertion    . Vasectomy    . Prostate cancer surgery  11/1996    radial retropubic prostatectomy  . Mitral valve repair    . Bilateral shoulder surgery    . Left knee arthroscopy    . I&d extremity  07/09/2011    Procedure: IRRIGATION AND DEBRIDEMENT EXTREMITY;  Surgeon: Tennis Must;  Location: [facility];  Service: Orthopedics;  Laterality: Left;  . Coronary artery bypass graft      x2    2009  . Zenker's diverticulectomy N/A 11/07/2012    Procedure: ZENKER'S DIVERTICULECTOMY ENDOSCOPIC;  Surgeon: Melida Quitter, MD;  Location: [facility];  Service: ENT;  Laterality: N/A;    History   Social History  . Marital Status: Married    Spouse  Name: margaret    Number of Children: 3  . Years of Education: N/A   Occupational History  . retired    Social History Main Topics  . Smoking status: Former Smoker -- 1.00 packs/day for 12 years    Types: Cigarettes    Quit date: 07/06/1968  . Smokeless tobacco: Never Used  . Alcohol Use: Yes     Comment: social use  . Drug Use: No  . Sexual Activity: Not on file   Other Topics Concern  . Not on file   Social History Narrative  . No narrative on file    ROS: no fevers or chills, productive cough, hemoptysis, dysphasia, odynophagia, melena, hematochezia, dysuria, hematuria, rash, seizure activity, orthopnea, PND, pedal edema, claudication. Remaining systems are negative.  Physical Exam: Well-developed well-nourished in no acute distress.  Skin is warm and dry.  HEENT is normal.  Neck is supple.  Chest is clear to auscultation with normal  expansion.  Cardiovascular exam is regular rate and rhythm.  Abdominal exam nontender or distended. No masses palpated. Extremities show no edema. neuro grossly intact  ECG Sinus rhythm with no ST changes.

## 2014-01-16 NOTE — Assessment & Plan Note (Signed)
Continue statin; not on ASA given need for coumadin.

## 2014-02-13 ENCOUNTER — Ambulatory Visit (INDEPENDENT_AMBULATORY_CARE_PROVIDER_SITE_OTHER): Payer: Medicare Other | Admitting: *Deleted

## 2014-02-13 DIAGNOSIS — I2699 Other pulmonary embolism without acute cor pulmonale: Secondary | ICD-10-CM

## 2014-02-13 DIAGNOSIS — Z5181 Encounter for therapeutic drug level monitoring: Secondary | ICD-10-CM | POA: Diagnosis not present

## 2014-02-13 DIAGNOSIS — Z7901 Long term (current) use of anticoagulants: Secondary | ICD-10-CM

## 2014-02-13 DIAGNOSIS — I82409 Acute embolism and thrombosis of unspecified deep veins of unspecified lower extremity: Secondary | ICD-10-CM

## 2014-02-13 LAB — POCT INR: INR: 2.3

## 2014-03-13 ENCOUNTER — Ambulatory Visit (INDEPENDENT_AMBULATORY_CARE_PROVIDER_SITE_OTHER): Payer: Medicare Other | Admitting: *Deleted

## 2014-03-13 DIAGNOSIS — I82409 Acute embolism and thrombosis of unspecified deep veins of unspecified lower extremity: Secondary | ICD-10-CM

## 2014-03-13 DIAGNOSIS — Z7901 Long term (current) use of anticoagulants: Secondary | ICD-10-CM | POA: Diagnosis not present

## 2014-03-13 DIAGNOSIS — I2699 Other pulmonary embolism without acute cor pulmonale: Secondary | ICD-10-CM

## 2014-03-13 DIAGNOSIS — Z5181 Encounter for therapeutic drug level monitoring: Secondary | ICD-10-CM

## 2014-03-13 LAB — POCT INR: INR: 1.9

## 2014-04-10 ENCOUNTER — Other Ambulatory Visit: Payer: Self-pay

## 2014-04-10 ENCOUNTER — Ambulatory Visit (INDEPENDENT_AMBULATORY_CARE_PROVIDER_SITE_OTHER): Payer: Medicare Other | Admitting: *Deleted

## 2014-04-10 DIAGNOSIS — Z5181 Encounter for therapeutic drug level monitoring: Secondary | ICD-10-CM | POA: Diagnosis not present

## 2014-04-10 DIAGNOSIS — Z7901 Long term (current) use of anticoagulants: Secondary | ICD-10-CM | POA: Diagnosis not present

## 2014-04-10 DIAGNOSIS — I2699 Other pulmonary embolism without acute cor pulmonale: Secondary | ICD-10-CM

## 2014-04-10 DIAGNOSIS — I82409 Acute embolism and thrombosis of unspecified deep veins of unspecified lower extremity: Secondary | ICD-10-CM

## 2014-04-10 LAB — POCT INR: INR: 2.3

## 2014-04-10 MED ORDER — METOPROLOL TARTRATE 50 MG PO TABS: ORAL_TABLET | ORAL | Status: DC

## 2014-04-10 MED ORDER — DOXAZOSIN MESYLATE 8 MG PO TABS: ORAL_TABLET | ORAL | Status: DC

## 2014-04-10 MED ORDER — AMLODIPINE BESYLATE 10 MG PO TABS: ORAL_TABLET | ORAL | Status: DC

## 2014-04-16 ENCOUNTER — Ambulatory Visit (HOSPITAL_COMMUNITY)
Admission: RE | Admit: 2014-04-16 | Discharge: 2014-04-16 | Disposition: A | Discharge status: Home / Self Care | Payer: Medicare Other | Source: Ambulatory Visit | Attending: Cardiovascular Disease | Admitting: Cardiovascular Disease

## 2014-04-16 DIAGNOSIS — I672 Cerebral atherosclerosis: Secondary | ICD-10-CM | POA: Diagnosis not present

## 2014-04-16 DIAGNOSIS — I679 Cerebrovascular disease, unspecified: Secondary | ICD-10-CM

## 2014-04-16 NOTE — Progress Notes (Signed)
Carotid Duplex Completed. John [name], BS, RDMS, RVT  

## 2014-05-08 ENCOUNTER — Ambulatory Visit (INDEPENDENT_AMBULATORY_CARE_PROVIDER_SITE_OTHER): Payer: Medicare Other | Admitting: *Deleted

## 2014-05-08 DIAGNOSIS — I82409 Acute embolism and thrombosis of unspecified deep veins of unspecified lower extremity: Secondary | ICD-10-CM

## 2014-05-08 DIAGNOSIS — Z5181 Encounter for therapeutic drug level monitoring: Secondary | ICD-10-CM | POA: Diagnosis not present

## 2014-05-08 DIAGNOSIS — Z7901 Long term (current) use of anticoagulants: Secondary | ICD-10-CM | POA: Diagnosis not present

## 2014-05-08 DIAGNOSIS — I2699 Other pulmonary embolism without acute cor pulmonale: Secondary | ICD-10-CM

## 2014-05-08 LAB — POCT INR: INR: 2.6

## 2014-06-13 ENCOUNTER — Ambulatory Visit (INDEPENDENT_AMBULATORY_CARE_PROVIDER_SITE_OTHER): Payer: Medicare Other | Admitting: *Deleted

## 2014-06-13 DIAGNOSIS — I2699 Other pulmonary embolism without acute cor pulmonale: Secondary | ICD-10-CM

## 2014-06-13 DIAGNOSIS — I82409 Acute embolism and thrombosis of unspecified deep veins of unspecified lower extremity: Secondary | ICD-10-CM | POA: Diagnosis not present

## 2014-06-13 DIAGNOSIS — Z5181 Encounter for therapeutic drug level monitoring: Secondary | ICD-10-CM | POA: Diagnosis not present

## 2014-06-13 DIAGNOSIS — Z7901 Long term (current) use of anticoagulants: Secondary | ICD-10-CM

## 2014-06-13 LAB — POCT INR: INR: 2.6

## 2014-06-13 MED ORDER — WARFARIN SODIUM 2.5 MG PO TABS: 2.5000 mg | ORAL_TABLET | ORAL | Status: DC

## 2014-07-14 ENCOUNTER — Other Ambulatory Visit: Payer: Self-pay | Admitting: Pulmonary Disease

## 2014-07-16 ENCOUNTER — Other Ambulatory Visit (INDEPENDENT_AMBULATORY_CARE_PROVIDER_SITE_OTHER): Payer: Medicare Other | Admitting: *Deleted

## 2014-07-16 DIAGNOSIS — E785 Hyperlipidemia, unspecified: Secondary | ICD-10-CM

## 2014-07-16 DIAGNOSIS — Z79899 Other long term (current) drug therapy: Secondary | ICD-10-CM

## 2014-07-16 LAB — HEPATIC FUNCTION PANEL
ALT: 33 U/L (ref 0–53)
AST: 22 U/L (ref 0–37)
Albumin: 3.9 g/dL (ref 3.5–5.2)
Alkaline Phosphatase: 88 U/L (ref 39–117)
BILIRUBIN TOTAL: 0.8 mg/dL (ref 0.2–1.2)
Bilirubin, Direct: 0.1 mg/dL (ref 0.0–0.3)
Total Protein: 6.7 g/dL (ref 6.0–8.3)

## 2014-07-16 LAB — LIPID PANEL
CHOLESTEROL: 229 mg/dL — AB (ref 0–200)
HDL: 43 mg/dL (ref >39.00)
LDL Cholesterol: 159 mg/dL — ABNORMAL HIGH (ref 0–99)
NONHDL: 186
TRIGLYCERIDES: 133 mg/dL (ref 0.0–149.0)
Total CHOL/HDL Ratio: 5
VLDL: 26.6 mg/dL (ref 0.0–40.0)

## 2014-07-17 ENCOUNTER — Ambulatory Visit: Payer: Medicare Other | Admitting: Pharmacist

## 2014-07-17 DIAGNOSIS — Z85828 Personal history of other malignant neoplasm of skin: Secondary | ICD-10-CM | POA: Diagnosis not present

## 2014-07-17 DIAGNOSIS — D225 Melanocytic nevi of trunk: Secondary | ICD-10-CM | POA: Diagnosis not present

## 2014-07-17 DIAGNOSIS — D1801 Hemangioma of skin and subcutaneous tissue: Secondary | ICD-10-CM | POA: Diagnosis not present

## 2014-07-17 DIAGNOSIS — L821 Other seborrheic keratosis: Secondary | ICD-10-CM | POA: Diagnosis not present

## 2014-07-17 DIAGNOSIS — L814 Other melanin hyperpigmentation: Secondary | ICD-10-CM | POA: Diagnosis not present

## 2014-07-18 ENCOUNTER — Ambulatory Visit (INDEPENDENT_AMBULATORY_CARE_PROVIDER_SITE_OTHER): Payer: Medicare Other | Admitting: Pharmacist

## 2014-07-18 ENCOUNTER — Encounter: Payer: Self-pay | Admitting: Pharmacist

## 2014-07-18 VITALS — Wt 187.0 lb

## 2014-07-18 DIAGNOSIS — Z7901 Long term (current) use of anticoagulants: Secondary | ICD-10-CM

## 2014-07-18 DIAGNOSIS — E785 Hyperlipidemia, unspecified: Secondary | ICD-10-CM | POA: Diagnosis not present

## 2014-07-18 DIAGNOSIS — I2699 Other pulmonary embolism without acute cor pulmonale: Secondary | ICD-10-CM

## 2014-07-18 DIAGNOSIS — Z5181 Encounter for therapeutic drug level monitoring: Secondary | ICD-10-CM

## 2014-07-18 DIAGNOSIS — I82409 Acute embolism and thrombosis of unspecified deep veins of unspecified lower extremity: Secondary | ICD-10-CM

## 2014-07-18 LAB — POCT INR: INR: 2.7

## 2014-07-18 MED ORDER — LOSARTAN POTASSIUM 100 MG PO TABS: 100.0000 mg | ORAL_TABLET | Freq: Every day | ORAL | Status: DC

## 2014-07-18 MED ORDER — PRAVASTATIN SODIUM 40 MG PO TABS: 20.0000 mg | ORAL_TABLET | Freq: Every day | ORAL | Status: DC

## 2014-07-18 NOTE — Progress Notes (Signed)
S/O: Patient is a 79yo male of Dr. Stanford Breed here for 11mo f/u of his hyperlipidemia. He has a baseline LDL of > 230 m/gd.  Patient started low dose pravastatin 20 mg + Zetia 10 mg qd 9 months ago.  He also started taking Co-Enzyme Q 10 (200mg  daily) given his h/o joint aches on multiple statins in the past, including pravastatin 40 mg qd.  Patient has a h/o CABG 2009.  He had been on pravastatin 40 mg qd  for a few years, but stopped it for a few weeks recently due to joint aches.  Restarted at just pravastatin 20 mg qd, and added Zetia as well given LDL well above 100 mg/dL on pravastatin monotherapy.   Zetia was used a few years ago, but was stopped at that time as there was an unknown benefit using Zetia.  Since then the IMPROVE-IT trial has shown zetia + statin was superior to statin monotherapy, and Zetia was restarted in patient.    RF:  CABG (2009), HTN, age - LDL goal < 70 if possible.  LDL goal of at least < 100 given baseline LDL > 200 mg/dL. Meds:  Pravastatin 20mg  daily (1/2 of 40mg  tab), Zetia 10mg  daily Intolerant:  Lipitor, Crestor, Zocor (all caused muscle aches), pravastatin 40 mg qd (joint pain).  Niaspan (flushing).  Took Welchol in past but stopped due to lack of potency and cost.  Diet:  Breakfast - cereal and coffee.  Lunch is typically a soup and sandwich at home.  Dinner is typically meat and vegetable.  He does eat red meat 3-4 times a week.  Rarely eats fried foods.  Doesn't drink soda or tea.  Rarely eats eggs. Exercise:  Due to knee issues, he is no longer able to do significant aerobic activity on a regular basis. Social history:  Non-smoker.  Rarely drinks alcohol.  Family history:  Brother and father both have h/o CAD.  Labs: 07/2013: TC 229, LDL 159, TG 133, HDL 43, LFTs WNL (pt reports he has been out of pravastatin for 1-2 weeks prior to draw) 01/2014:  TC 145, LDL 78, TG 76, HDL 52, LFTs normal (pravastatin 20 mg qd + Zetia 10 mg qd) 10/2013:  TC 312, LDL 232, TG 125,  HDL 55, Glucose 104, Scr 1.1, LFTs normal, TSH 4.0 (not on lipid lowering therapy at time of labs) 09/2012:  LDL 107, TC 177 (pravastatin 40 mg qd) 06/2011:  LDL 77 (pravastatin 40 mg qd + Zetia 10 mg qd)  Current Outpatient Prescriptions  Medication Sig Dispense Refill  . amLODipine (NORVASC) 10 MG tablet Take one-half tablet by  mouth daily 90 tablet 2  . Coenzyme Q10 (CO Q-10) 100 MG CAPS Take 200 mg by mouth daily.  0  . doxazosin (CARDURA) 8 MG tablet Take 1 tablet (8 mg total)  by mouth at bedtime. 90 tablet 2  . ezetimibe (ZETIA) 10 MG tablet Take 1 tablet (10 mg total) by mouth daily. 90 tablet 3  . losartan (COZAAR) 100 MG tablet Take 1 tablet (100 mg total) by mouth daily. 90 tablet 1  . metoprolol (LOPRESSOR) 50 MG tablet Take 1 tablet by mouth two  times daily 90 tablet 2  . pantoprazole (PROTONIX) 40 MG tablet Take 1 tablet (40 mg total) by mouth daily. 90 tablet 3  . pravastatin (PRAVACHOL) 40 MG tablet Take 0.5 tablets (20 mg total) by mouth daily. 45 tablet 3  . warfarin (COUMADIN) 2.5 MG tablet Take 1 tablet (2.5 mg  total) by mouth as directed. 90 tablet 1   No current facility-administered medications for this visit.   Allergies  Allergen Reactions  . Iodine   . Iohexol      Code: HIVES, Desc: PT ALLERGIC TO IV CONTRAST;(REACTION DURING CARDIAC CATHETERIZATION); NEEDS PREMEDS PER DR CRENSHAW!, Onset Date: [id number]   . Statins     Joint aches with Zocor, Lipitor, Crestor   Family History  Problem Relation Age of Onset  . Heart attack Father   . Stroke Mother   . Hypertension Mother   . Hypertension Brother   . Heart disease Brother   . Prostate cancer Brother    A/P: Pt's lipid panel is significantly worse compared to previous draw 43mo ago.  Pt reports that he had been out of his pravastatin for 1-2 weeks prior to most recent lipid panel, but I would not expect that to be the only cause of the increase in LDL - also possibly in part d/t recent holidays?  Pt is  reluctant to make any med changes at this time.  He wants to continue with current regimen, get refills, but follow-up in lipid clinic sooner.  -Refills sent for pravastatin (out for 1-2 weeks) and losartan (out for 3-4 days) -Continue taking pravastatin 20mg  QHS (1/2 40mg  tab), Zetia 10mg  daily, and CoQ10 200mg  daily -Check FLP and LFTs in 60mo -F/U in lipid clinic 1 week after checking labs -At f/u visit: may consider increasing pravastatin if pt willing now that he is taking CoQ10, possible PCSK-9 inhibitor d/t ASCVD depending on third party coverage in the future, assess diet and exercise  Drucie Opitz, PharmD Clinical Pharmacy Resident Pager: [phone]

## 2014-07-18 NOTE — Patient Instructions (Addendum)
It was nice to meet you today.  -I have sent in a refill so you can begin taking your pravastatin again -Continue taking pravastatin 20mg  (1/2 of 40mg  tab) each evening at bedtime -Continue taking Zetia 10mg  daily and CoQ10 200mg  daily -Check fasting cholesterol panel and liver function in 3 months -Follow-up in lipid clinic 1 week after checking labs

## 2014-07-19 ENCOUNTER — Other Ambulatory Visit: Payer: Self-pay | Admitting: *Deleted

## 2014-07-19 MED ORDER — PRAVASTATIN SODIUM 40 MG PO TABS: 20.0000 mg | ORAL_TABLET | Freq: Every day | ORAL | Status: DC

## 2014-08-29 ENCOUNTER — Ambulatory Visit (INDEPENDENT_AMBULATORY_CARE_PROVIDER_SITE_OTHER): Payer: Medicare Other | Admitting: *Deleted

## 2014-08-29 DIAGNOSIS — I82409 Acute embolism and thrombosis of unspecified deep veins of unspecified lower extremity: Secondary | ICD-10-CM

## 2014-08-29 DIAGNOSIS — Z5181 Encounter for therapeutic drug level monitoring: Secondary | ICD-10-CM | POA: Diagnosis not present

## 2014-08-29 DIAGNOSIS — I2699 Other pulmonary embolism without acute cor pulmonale: Secondary | ICD-10-CM

## 2014-08-29 DIAGNOSIS — Z7901 Long term (current) use of anticoagulants: Secondary | ICD-10-CM

## 2014-08-29 LAB — POCT INR: INR: 2.6

## 2014-09-21 ENCOUNTER — Ambulatory Visit (INDEPENDENT_AMBULATORY_CARE_PROVIDER_SITE_OTHER)
Admission: RE | Admit: 2014-09-21 | Discharge: 2014-09-21 | Disposition: A | Discharge status: Home / Self Care | Payer: Medicare Other | Source: Ambulatory Visit | Attending: Pulmonary Disease | Admitting: Pulmonary Disease

## 2014-09-21 ENCOUNTER — Ambulatory Visit (INDEPENDENT_AMBULATORY_CARE_PROVIDER_SITE_OTHER): Payer: Medicare Other | Admitting: Pulmonary Disease

## 2014-09-21 ENCOUNTER — Encounter: Payer: Self-pay | Admitting: Pulmonary Disease

## 2014-09-21 ENCOUNTER — Encounter (INDEPENDENT_AMBULATORY_CARE_PROVIDER_SITE_OTHER): Payer: Self-pay

## 2014-09-21 VITALS — BP 132/70 | HR 66 | Temp 97.0°F | Ht 68.0 in | Wt 186.6 lb

## 2014-09-21 DIAGNOSIS — Z9889 Other specified postprocedural states: Secondary | ICD-10-CM

## 2014-09-21 DIAGNOSIS — K635 Polyp of colon: Secondary | ICD-10-CM | POA: Diagnosis not present

## 2014-09-21 DIAGNOSIS — I480 Paroxysmal atrial fibrillation: Secondary | ICD-10-CM | POA: Diagnosis not present

## 2014-09-21 DIAGNOSIS — E785 Hyperlipidemia, unspecified: Secondary | ICD-10-CM | POA: Diagnosis not present

## 2014-09-21 DIAGNOSIS — I25810 Atherosclerosis of coronary artery bypass graft(s) without angina pectoris: Secondary | ICD-10-CM

## 2014-09-21 DIAGNOSIS — M150 Primary generalized (osteo)arthritis: Secondary | ICD-10-CM

## 2014-09-21 DIAGNOSIS — Z86718 Personal history of other venous thrombosis and embolism: Secondary | ICD-10-CM | POA: Insufficient documentation

## 2014-09-21 DIAGNOSIS — Z86711 Personal history of pulmonary embolism: Secondary | ICD-10-CM

## 2014-09-21 DIAGNOSIS — I10 Essential (primary) hypertension: Secondary | ICD-10-CM | POA: Diagnosis not present

## 2014-09-21 DIAGNOSIS — I739 Peripheral vascular disease, unspecified: Secondary | ICD-10-CM

## 2014-09-21 DIAGNOSIS — Z23 Encounter for immunization: Secondary | ICD-10-CM

## 2014-09-21 DIAGNOSIS — I059 Rheumatic mitral valve disease, unspecified: Secondary | ICD-10-CM | POA: Diagnosis not present

## 2014-09-21 DIAGNOSIS — F419 Anxiety disorder, unspecified: Secondary | ICD-10-CM

## 2014-09-21 DIAGNOSIS — Z951 Presence of aortocoronary bypass graft: Secondary | ICD-10-CM | POA: Diagnosis not present

## 2014-09-21 DIAGNOSIS — I779 Disorder of arteries and arterioles, unspecified: Secondary | ICD-10-CM

## 2014-09-21 DIAGNOSIS — C61 Malignant neoplasm of prostate: Secondary | ICD-10-CM

## 2014-09-21 DIAGNOSIS — Z8719 Personal history of other diseases of the digestive system: Secondary | ICD-10-CM

## 2014-09-21 DIAGNOSIS — M159 Polyosteoarthritis, unspecified: Secondary | ICD-10-CM

## 2014-09-21 DIAGNOSIS — K2100 Gastro-esophageal reflux disease with esophagitis, without bleeding: Secondary | ICD-10-CM

## 2014-09-21 MED ORDER — METOPROLOL TARTRATE 50 MG PO TABS: ORAL_TABLET | ORAL | Status: DC

## 2014-09-21 NOTE — Patient Instructions (Signed)
Today we updated your med list in our EPIC system...    Continue your current medications the same...  Today we did your follow up CXR... Please return to our lab one morning FASTING for your follow up blood work...    We will contact you w/ the results when available...     We will also forward a copy of the lipid panel to the lipid clinic staff....  We gave you the FLU vaccine today...  Call for any questions...  Let's plan a follow up visit in 43yr, sooner if needed for problems.Marland KitchenMarland Kitchen
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
## 2014-09-27 ENCOUNTER — Other Ambulatory Visit (INDEPENDENT_AMBULATORY_CARE_PROVIDER_SITE_OTHER): Payer: Medicare Other

## 2014-09-27 ENCOUNTER — Other Ambulatory Visit: Payer: Self-pay | Admitting: Pulmonary Disease

## 2014-09-27 DIAGNOSIS — I10 Essential (primary) hypertension: Secondary | ICD-10-CM

## 2014-09-27 DIAGNOSIS — F419 Anxiety disorder, unspecified: Secondary | ICD-10-CM

## 2014-09-27 DIAGNOSIS — I25810 Atherosclerosis of coronary artery bypass graft(s) without angina pectoris: Secondary | ICD-10-CM

## 2014-09-27 DIAGNOSIS — C61 Malignant neoplasm of prostate: Secondary | ICD-10-CM

## 2014-09-27 LAB — CBC WITH DIFFERENTIAL/PLATELET
BASOS PCT: 0.7 % (ref 0.0–3.0)
Basophils Absolute: 0 10*3/uL (ref 0.0–0.1)
EOS ABS: 0.1 10*3/uL (ref 0.0–0.7)
Eosinophils Relative: 3.2 % (ref 0.0–5.0)
HCT: 38.4 % — ABNORMAL LOW (ref 39.0–52.0)
HEMOGLOBIN: 13.2 g/dL (ref 13.0–17.0)
Lymphocytes Relative: 35.9 % (ref 12.0–46.0)
Lymphs Abs: 1.6 10*3/uL (ref 0.7–4.0)
MCHC: 34.3 g/dL (ref 30.0–36.0)
MCV: 82.6 fl (ref 78.0–100.0)
MONOS PCT: 10.1 % (ref 3.0–12.0)
Monocytes Absolute: 0.4 10*3/uL (ref 0.1–1.0)
NEUTROS ABS: 2.2 10*3/uL (ref 1.4–7.7)
Neutrophils Relative %: 50.1 % (ref 43.0–77.0)
Platelets: 157 10*3/uL (ref 150.0–400.0)
RBC: 4.65 Mil/uL (ref 4.22–5.81)
RDW: 14.8 % (ref 11.5–15.5)
WBC: 4.3 10*3/uL (ref 4.0–10.5)

## 2014-09-27 LAB — HEPATIC FUNCTION PANEL
ALT: 24 U/L (ref 0–53)
AST: 22 U/L (ref 0–37)
Albumin: 3.9 g/dL (ref 3.5–5.2)
Alkaline Phosphatase: 98 U/L (ref 39–117)
BILIRUBIN DIRECT: 0.1 mg/dL (ref 0.0–0.3)
BILIRUBIN TOTAL: 0.5 mg/dL (ref 0.2–1.2)
TOTAL PROTEIN: 6.3 g/dL (ref 6.0–8.3)

## 2014-09-27 LAB — BASIC METABOLIC PANEL
BUN: 23 mg/dL (ref 6–23)
CHLORIDE: 112 meq/L (ref 96–112)
CO2: 25 mEq/L (ref 19–32)
CREATININE: 1.1 mg/dL (ref 0.40–1.50)
Calcium: 8.9 mg/dL (ref 8.4–10.5)
GFR: 69.09 mL/min (ref >60.00)
GLUCOSE: 101 mg/dL — AB (ref 70–99)
POTASSIUM: 4.3 meq/L (ref 3.5–5.1)
Sodium: 143 mEq/L (ref 135–145)

## 2014-09-27 LAB — LIPID PANEL
CHOL/HDL RATIO: 3
Cholesterol: 149 mg/dL (ref 0–200)
HDL: 53.9 mg/dL (ref >39.00)
LDL CALC: 82 mg/dL (ref 0–99)
NonHDL: 95.1
TRIGLYCERIDES: 68 mg/dL (ref 0.0–149.0)
VLDL: 13.6 mg/dL (ref 0.0–40.0)

## 2014-09-27 LAB — TSH: TSH: 3.03 u[IU]/mL (ref 0.35–4.50)

## 2014-09-27 LAB — PSA: PSA: 0.11 ng/mL (ref 0.10–4.00)

## 2014-10-10 ENCOUNTER — Ambulatory Visit (INDEPENDENT_AMBULATORY_CARE_PROVIDER_SITE_OTHER): Payer: Medicare Other | Admitting: *Deleted

## 2014-10-10 DIAGNOSIS — Z5181 Encounter for therapeutic drug level monitoring: Secondary | ICD-10-CM | POA: Diagnosis not present

## 2014-10-10 DIAGNOSIS — I82409 Acute embolism and thrombosis of unspecified deep veins of unspecified lower extremity: Secondary | ICD-10-CM

## 2014-10-10 DIAGNOSIS — I2699 Other pulmonary embolism without acute cor pulmonale: Secondary | ICD-10-CM | POA: Diagnosis not present

## 2014-10-10 DIAGNOSIS — Z7901 Long term (current) use of anticoagulants: Secondary | ICD-10-CM | POA: Diagnosis not present

## 2014-10-10 LAB — POCT INR: INR: 2.5

## 2014-10-15 ENCOUNTER — Other Ambulatory Visit: Payer: Medicare Other

## 2014-10-17 ENCOUNTER — Telehealth: Payer: Self-pay | Admitting: Pharmacist

## 2014-10-17 NOTE — Telephone Encounter (Signed)
Called to speak with patient regarding lipid panel results.  His LDL has decreased significantly with pravastatin 20mg  and Zetia 10mg  daily.  He is concerned because he is having hip and knee pain.  He states it is more in the joints.  He is concerned it may be related to the pravastatin.  Unsure if related given joint pain rather than muscular pain.  Will have him hold the pravastatin for about 2 weeks and see if there is any change in his pain.  If not, will need to restart pravastatin.  He is agreeable to plan and will call us with an update.

## 2014-10-18 ENCOUNTER — Ambulatory Visit: Payer: Medicare Other | Admitting: Pharmacist

## 2014-11-21 ENCOUNTER — Ambulatory Visit (INDEPENDENT_AMBULATORY_CARE_PROVIDER_SITE_OTHER): Payer: Medicare Other | Admitting: *Deleted

## 2014-11-21 DIAGNOSIS — I82409 Acute embolism and thrombosis of unspecified deep veins of unspecified lower extremity: Secondary | ICD-10-CM | POA: Diagnosis not present

## 2014-11-21 DIAGNOSIS — I2699 Other pulmonary embolism without acute cor pulmonale: Secondary | ICD-10-CM | POA: Diagnosis not present

## 2014-11-21 DIAGNOSIS — Z5181 Encounter for therapeutic drug level monitoring: Secondary | ICD-10-CM | POA: Diagnosis not present

## 2014-11-21 DIAGNOSIS — Z7901 Long term (current) use of anticoagulants: Secondary | ICD-10-CM | POA: Diagnosis not present

## 2014-11-21 LAB — POCT INR: INR: 1.8

## 2014-11-22 ENCOUNTER — Ambulatory Visit (INDEPENDENT_AMBULATORY_CARE_PROVIDER_SITE_OTHER): Payer: Medicare Other | Admitting: Pharmacist

## 2014-11-22 DIAGNOSIS — I25810 Atherosclerosis of coronary artery bypass graft(s) without angina pectoris: Secondary | ICD-10-CM

## 2014-11-22 DIAGNOSIS — E785 Hyperlipidemia, unspecified: Secondary | ICD-10-CM

## 2014-11-22 MED ORDER — PRAVASTATIN SODIUM 10 MG PO TABS: 10.0000 mg | ORAL_TABLET | Freq: Every day | ORAL | Status: DC

## 2014-11-22 NOTE — Progress Notes (Signed)
S/O: Patient is a 79yo male of Dr. Stanford Breed here for f/u of his hyperlipidemia. He has a baseline LDL of > 230 m/gd.  Patient started low dose pravastatin 20 mg + Zetia 10 mg qd last year but had to stop pravastatin in April due to joint pain.  He has not noticed a large difference in his pain since stopping pravastatin so he came in today to discuss other options.  He is taking Co-Enzyme Q 10 (200mg  daily) given his h/o joint aches.   RF:  CABG (2009), HTN, age - LDL goal < 70 if possible.  LDL goal of at least < 100 given baseline LDL > 200 mg/dL. Meds:   Zetia 10mg  daily Intolerant:  Lipitor, Crestor, Zocor (all caused muscle aches), pravastatin 20mg  and 40 mg qd (joint pain).  Niaspan (flushing).  Took Welchol in past but stopped due to lack of potency and cost.  Diet:  Breakfast - cereal and coffee.  Lunch is typically a soup and sandwich at home.  Dinner is typically meat and vegetable.  He does eat red meat 3-4 times a week.  Rarely eats fried foods.  Doesn't drink soda or tea.  Rarely eats eggs. Exercise:  Due to knee issues, he is no longer able to do significant aerobic activity on a regular basis. Social history:  Non-smoker.  Rarely drinks alcohol.  Family history:  Brother and father both have h/o CAD.  Labs: 09/2014: TC 149, LDL 82, TG 68, HDL 54, LFTs WNL (Pravastatin 20mg  and Zetia 10mg  daily) 07/2013: TC 229, LDL 159, TG 133, HDL 43, LFTs WNL (pt reports he has been out of pravastatin for 1-2 weeks prior to draw- Zetia 10mg  daily only) 01/2014:  TC 145, LDL 78, TG 76, HDL 52, LFTs normal (pravastatin 20 mg qd + Zetia 10 mg qd) 10/2013:  TC 312, LDL 232, TG 125, HDL 55, Glucose 104, Scr 1.1, LFTs normal, TSH 4.0 (not on lipid lowering therapy at time of labs) 09/2012:  LDL 107, TC 177 (pravastatin 40 mg qd) 06/2011:  LDL 77 (pravastatin 40 mg qd + Zetia 10 mg qd)  Current Outpatient Prescriptions  Medication Sig Dispense Refill  . amLODipine (NORVASC) 10 MG tablet Take one-half  tablet by  mouth daily 90 tablet 2  . Coenzyme Q10 (CO Q-10) 100 MG CAPS Take 200 mg by mouth daily.  0  . doxazosin (CARDURA) 8 MG tablet Take 1 tablet (8 mg total)  by mouth at bedtime. 90 tablet 2  . ezetimibe (ZETIA) 10 MG tablet Take 1 tablet (10 mg total) by mouth daily. 90 tablet 3  . losartan (COZAAR) 100 MG tablet Take 1 tablet (100 mg total) by mouth daily. 90 tablet 1  . metoprolol (LOPRESSOR) 50 MG tablet Take 1 tablet by mouth two  times daily 90 tablet 2  . pantoprazole (PROTONIX) 40 MG tablet Take 1 tablet (40 mg total) by mouth daily. 90 tablet 3  . pravastatin (PRAVACHOL) 10 MG tablet Take 1 tablet (10 mg total) by mouth daily. 30 tablet 6  . warfarin (COUMADIN) 2.5 MG tablet Take 1 tablet (2.5 mg total) by mouth as directed. 90 tablet 1   No current facility-administered medications for this visit.   Allergies  Allergen Reactions  . Iodine   . Iohexol      Code: HIVES, Desc: PT ALLERGIC TO IV CONTRAST;(REACTION DURING CARDIAC CATHETERIZATION); NEEDS PREMEDS PER DR CRENSHAW!, Onset Date: [id number]   . Statins     Joint aches  with Zocor, Lipitor, Crestor   Family History  Problem Relation Age of Onset  . Heart attack Father   . Stroke Mother   . Hypertension Mother   . Hypertension Brother   . Heart disease Brother   . Prostate cancer Brother    A/P:  1.  Hyperlipidemia-  Pt reports little change in joint pain with stopping pravastatin.  He is interested in restarting. Will start with pravastatin 10mg  daily and increase as tolerated.  Will continue Zetia 10mg  daily.  Plan to recheck labs in 2 months with July Coumadin check.

## 2014-11-22 NOTE — Patient Instructions (Signed)
Start pravastatin 10mg  daily.  We will recheck your labs in July with your Coumadin check.

## 2014-12-12 ENCOUNTER — Ambulatory Visit (INDEPENDENT_AMBULATORY_CARE_PROVIDER_SITE_OTHER): Payer: Medicare Other | Admitting: *Deleted

## 2014-12-12 DIAGNOSIS — I82409 Acute embolism and thrombosis of unspecified deep veins of unspecified lower extremity: Secondary | ICD-10-CM

## 2014-12-12 DIAGNOSIS — Z7901 Long term (current) use of anticoagulants: Secondary | ICD-10-CM

## 2014-12-12 DIAGNOSIS — I2699 Other pulmonary embolism without acute cor pulmonale: Secondary | ICD-10-CM | POA: Diagnosis not present

## 2014-12-12 DIAGNOSIS — Z5181 Encounter for therapeutic drug level monitoring: Secondary | ICD-10-CM | POA: Diagnosis not present

## 2014-12-12 LAB — POCT INR: INR: 2.2

## 2015-01-14 DIAGNOSIS — H5213 Myopia, bilateral: Secondary | ICD-10-CM | POA: Diagnosis not present

## 2015-01-14 DIAGNOSIS — H2513 Age-related nuclear cataract, bilateral: Secondary | ICD-10-CM | POA: Diagnosis not present

## 2015-01-16 ENCOUNTER — Other Ambulatory Visit (INDEPENDENT_AMBULATORY_CARE_PROVIDER_SITE_OTHER): Payer: Medicare Other | Admitting: *Deleted

## 2015-01-16 DIAGNOSIS — E785 Hyperlipidemia, unspecified: Secondary | ICD-10-CM | POA: Diagnosis not present

## 2015-01-16 LAB — LIPID PANEL
CHOLESTEROL: 194 mg/dL (ref 0–200)
HDL: 50.8 mg/dL (ref >39.00)
LDL CALC: 127 mg/dL — AB (ref 0–99)
NonHDL: 143.2
Total CHOL/HDL Ratio: 4
Triglycerides: 81 mg/dL (ref 0.0–149.0)
VLDL: 16.2 mg/dL (ref 0.0–40.0)

## 2015-01-16 LAB — HEPATIC FUNCTION PANEL
ALBUMIN: 4.1 g/dL (ref 3.5–5.2)
ALT: 18 U/L (ref 0–53)
AST: 20 U/L (ref 0–37)
Alkaline Phosphatase: 99 U/L (ref 39–117)
Bilirubin, Direct: 0.1 mg/dL (ref 0.0–0.3)
TOTAL PROTEIN: 6.7 g/dL (ref 6.0–8.3)
Total Bilirubin: 0.5 mg/dL (ref 0.2–1.2)

## 2015-01-16 NOTE — Addendum Note (Signed)
Addended by: Eulis Foster on: 01/16/2015 07:46 AM   Modules accepted: Orders

## 2015-01-23 ENCOUNTER — Ambulatory Visit (INDEPENDENT_AMBULATORY_CARE_PROVIDER_SITE_OTHER): Payer: Self-pay | Admitting: Pharmacist

## 2015-01-23 ENCOUNTER — Ambulatory Visit (INDEPENDENT_AMBULATORY_CARE_PROVIDER_SITE_OTHER): Payer: Medicare Other | Admitting: *Deleted

## 2015-01-23 DIAGNOSIS — Z7901 Long term (current) use of anticoagulants: Secondary | ICD-10-CM

## 2015-01-23 DIAGNOSIS — I2699 Other pulmonary embolism without acute cor pulmonale: Secondary | ICD-10-CM

## 2015-01-23 DIAGNOSIS — E785 Hyperlipidemia, unspecified: Secondary | ICD-10-CM

## 2015-01-23 DIAGNOSIS — Z5181 Encounter for therapeutic drug level monitoring: Secondary | ICD-10-CM | POA: Diagnosis not present

## 2015-01-23 DIAGNOSIS — I82409 Acute embolism and thrombosis of unspecified deep veins of unspecified lower extremity: Secondary | ICD-10-CM | POA: Diagnosis not present

## 2015-01-23 DIAGNOSIS — I25810 Atherosclerosis of coronary artery bypass graft(s) without angina pectoris: Secondary | ICD-10-CM

## 2015-01-23 LAB — POCT INR: INR: 2.2

## 2015-01-23 MED ORDER — PRAVASTATIN SODIUM 20 MG PO TABS: 20.0000 mg | ORAL_TABLET | Freq: Every day | ORAL | Status: DC

## 2015-01-23 NOTE — Progress Notes (Signed)
[name], male   DOB: 1935-11-26, 79 y.o.   MRN: [id number]  S/O: Patient is a 79yo male of Dr. Stanford Breed here for f/u of his hyperlipidemia. He has a baseline LDL of > 230 m/gd.  Patient started low dose pravastatin 20 mg + Zetia 10 mg qd last year but had to stop pravastatin in April due to joint pain.  Because he did not feel any relief of joint pain when he stopped pravastatin, he restarted pravastatin at a 10 mg dose in 11/2014 after lipid clinic visit.  He is taking Co-Enzyme Q 10 (200mg  daily) given his h/o joint aches.   RF:  CABG (2009), HTN, age - LDL goal < 70 if possible.  LDL goal of at least < 100 given baseline LDL > 200 mg/dL. Meds:   Zetia 10 mg daily, Pravastatin 10 mg daily Intolerant:  Lipitor, Crestor, Zocor (all caused muscle aches - doses unknown), pravastatin 20mg  and 40 mg qd (joint pain).  Niaspan (flushing).  Took Welchol in past but stopped due to lack of potency and cost.  Diet:  Breakfast - cereal and coffee.  Lunch is typically a soup and sandwich at home.  Dinner is typically meat and vegetable. Rarely eats fried foods.  Doesn't drink soda or tea.  Rarely eats eggs. Patient reports recent improvement in diet, specifically reducing red meat intake to only 1-2 servings per week. Also, notes that he has been eating less in general. Has lost 8-10 pounds and wishes to lose 8-10 more pounds. Exercise:  Due to knee issues, he is no longer able to do as much exercise as he used to. However, he reports walking ~2 miles/day and doing yard work for for several hours 3-4x/week. States that he needs to have a knee replacement but does not know when this will be. Social history:  Non-smoker.  Rarely drinks alcohol.  Family history:  Brother and father both have h/o CAD.  Labs: 01/2015: TC 194, LDL 127, TG 81, HDL 50.8, LFTs WNL (pravastatin 10mg  + Zetia 10mg  daily) 09/2014: TC 149, LDL 82, TG 68, HDL 54, LFTs WNL (pravastatin 20mg  + Zetia 10mg  daily) 07/2014: TC 229, LDL  159, TG 133, HDL 43, LFTs WNL (pt reports he has been out of pravastatin for 1-2 weeks prior to draw- Zetia 10mg  daily only) 01/2014:  TC 145, LDL 78, TG 76, HDL 52, LFTs normal (pravastatin 20 mg qd + Zetia 10 mg qd) 10/2013:  TC 312, LDL 232, TG 125, HDL 55, Glucose 104, Scr 1.1, LFTs normal, TSH 4.0 (not on lipid lowering therapy at time of labs) 09/2012:  LDL 107, TC 177 (pravastatin 40 mg qd) 06/2011:  LDL 77 (pravastatin 40 mg qd + Zetia 10 mg qd)  Current Outpatient Prescriptions  Medication Sig Dispense Refill  . amLODipine (NORVASC) 10 MG tablet Take one-half tablet by  mouth daily 90 tablet 2  . Coenzyme Q10 (CO Q-10) 100 MG CAPS Take 200 mg by mouth daily.  0  . doxazosin (CARDURA) 8 MG tablet Take 1 tablet (8 mg total)  by mouth at bedtime. 90 tablet 2  . ezetimibe (ZETIA) 10 MG tablet Take 1 tablet (10 mg total) by mouth daily. 90 tablet 3  . losartan (COZAAR) 100 MG tablet Take 1 tablet (100 mg total) by mouth daily. 90 tablet 1  . metoprolol (LOPRESSOR) 50 MG tablet Take 1 tablet by mouth two  times daily 90 tablet 2  . pantoprazole (PROTONIX) 40 MG tablet  Take 1 tablet (40 mg total) by mouth daily. 90 tablet 3  . pravastatin (PRAVACHOL) 20 MG tablet Take 1 tablet (20 mg total) by mouth daily. 90 tablet 3  . warfarin (COUMADIN) 2.5 MG tablet Take 1 tablet (2.5 mg total) by mouth as directed. 90 tablet 1   No current facility-administered medications for this visit.   Allergies  Allergen Reactions  . Iodine   . Iohexol      Code: HIVES, Desc: PT ALLERGIC TO IV CONTRAST;(REACTION DURING CARDIAC CATHETERIZATION); NEEDS PREMEDS PER DR CRENSHAW!, Onset Date: [id number]   . Statins     Joint aches with Zocor, Lipitor, Crestor   Family History  Problem Relation Age of Onset  . Heart attack Father   . Stroke Mother   . Hypertension Mother   . Hypertension Brother   . Heart disease Brother   . Prostate cancer Brother    A/P: 1.  Hyperlipidemia-  LDL elevated at 127 on Zetia  10mg  daily and pravastatin 10mg  daily. Discussed two potential options with patient: 1) increasing pravastatin back to 20mg  dose or 2) trying a PCSK9 inhibitor. Patient does not believe there was much difference in his aches when he was taking pravastatin vs. when he wasn't. Patient prefers to increase pravastatin back to 20mg  rather than start a new medication. New rx sent for pravastatin 20mg  daily, patient will continue Zetia 10mg  daily and CoQ10 200mg  daily. Expect LDL to decrease to 80s based on previous labwork when patient took pravastatin 20mg . Patient informed to call clinic if any questions/concerns. Will recheck labs in 6 months.  Tomasina Keasling E. Delbra Zellars, PharmD [facility] [address], [place], [place] [address] Phone: [phone]; Fax: [phone] 01/23/2015 10:02 AM

## 2015-01-23 NOTE — Progress Notes (Signed)
HPI: FU coronary artery disease status post coronary bypassing graft, as well as mitral valve repair in January 2009. The patient had an Myoview performed on July 12, 2008. This was normal with an ejection fraction of 66%. Last echocardiogram in October of 2013 showed normal LV function, status post mitral valve repair with normal gradients and trace mitral regurgitation. There was mild left atrial enlargement. Last carotid Dopplers performed in October 2015 showed minimal plaque and fu recommended as needed. Since I last saw him, the patient denies any dyspnea on exertion, orthopnea, PND, pedal edema, palpitations, syncope or chest pain.   Current Outpatient Prescriptions  Medication Sig Dispense Refill  . amLODipine (NORVASC) 10 MG tablet Take one-half tablet by  mouth daily 90 tablet 2  . Coenzyme Q10 (CO Q-10) 100 MG CAPS Take 200 mg by mouth daily.  0  . doxazosin (CARDURA) 8 MG tablet Take 1 tablet (8 mg total)  by mouth at bedtime. 90 tablet 2  . ezetimibe (ZETIA) 10 MG tablet Take 1 tablet (10 mg total) by mouth daily. 90 tablet 3  . losartan (COZAAR) 100 MG tablet Take 1 tablet (100 mg total) by mouth daily. 90 tablet 1  . metoprolol (LOPRESSOR) 50 MG tablet Take 1 tablet by mouth two  times daily 90 tablet 2  . pravastatin (PRAVACHOL) 20 MG tablet Take 1 tablet (20 mg total) by mouth daily. 90 tablet 3  . warfarin (COUMADIN) 2.5 MG tablet Take 1 tablet (2.5 mg total) by mouth as directed. 90 tablet 1  . pantoprazole (PROTONIX) 40 MG tablet Take 1 tablet (40 mg total) by mouth daily. 90 tablet 3   No current facility-administered medications for this visit.     Past Medical History  Diagnosis Date  . PE (pulmonary embolism)   . Deep vein thrombophlebitis of leg   . Essential hypertension, benign   . CAD (coronary artery disease)     s/p CABG  . Carotid stenosis   . Hyperlipidemia   . GERD (gastroesophageal reflux disease)   . Hemorrhoids   . Prostate cancer   . DJD  (degenerative joint disease)   . Sebaceous cyst     neck  . Seborrheic dermatitis   . Anxiety   . Hiatal hernia   . Tubular adenoma of colon 05/2011  . Complication of anesthesia     zenkers  diverticulum    Past Surgical History  Procedure Laterality Date  . Tandem and ovoid insertion    . Vasectomy    . Prostate cancer surgery  11/1996    radial retropubic prostatectomy  . Mitral valve repair    . Bilateral shoulder surgery    . Left knee arthroscopy    . I&d extremity  07/09/2011    Procedure: IRRIGATION AND DEBRIDEMENT EXTREMITY;  Surgeon: Tennis Must;  Location: [facility];  Service: Orthopedics;  Laterality: Left;  . Coronary artery bypass graft      x2    2009  . Zenker's diverticulectomy N/A 11/07/2012    Procedure: ZENKER'S DIVERTICULECTOMY ENDOSCOPIC;  Surgeon: Melida Quitter, MD;  Location: [facility];  Service: ENT;  Laterality: N/A;    History   Social History  . Marital Status: Married    Spouse Name: margaret  . Number of Children: 3  . Years of Education: N/A   Occupational History  . retired    Social History Main Topics  . Smoking status: Former Smoker -- 1.00 packs/day for 12  years    Types: Cigarettes    Quit date: 07/06/1968  . Smokeless tobacco: Never Used  . Alcohol Use: Yes     Comment: social use  . Drug Use: No  . Sexual Activity: Not on file   Other Topics Concern  . Not on file   Social History Narrative    ROS: no fevers or chills, productive cough, hemoptysis, dysphasia, odynophagia, melena, hematochezia, dysuria, hematuria, rash, seizure activity, orthopnea, PND, pedal edema, claudication. Remaining systems are negative.  Physical Exam: Well-developed well-nourished in no acute distress.  Skin is warm and dry.  HEENT is normal.  Neck is supple.  Chest is clear to auscultation with normal expansion.  Cardiovascular exam is regular rate and rhythm.  Abdominal exam nontender or distended. No masses palpated. Extremities  show no edema. neuro grossly intact  ECG sinus rhythm at a rate of 60. No ST changes.

## 2015-01-23 NOTE — Patient Instructions (Signed)
We sent over a new prescription for pravastatin 20mg  once daily. Continue taking your Zetia 10mg  daily and Coenzyme Q10 200mg  daily. b Come back in 6 months for bloodwork so we can check your cholesterol on Wednesday, January 18th.

## 2015-01-25 ENCOUNTER — Encounter: Payer: Self-pay | Admitting: Cardiology

## 2015-01-25 ENCOUNTER — Ambulatory Visit (INDEPENDENT_AMBULATORY_CARE_PROVIDER_SITE_OTHER): Payer: Medicare Other | Admitting: Cardiology

## 2015-01-25 VITALS — BP 170/60 | HR 60 | Ht 69.0 in | Wt 179.7 lb

## 2015-01-25 DIAGNOSIS — I480 Paroxysmal atrial fibrillation: Secondary | ICD-10-CM | POA: Diagnosis not present

## 2015-01-25 DIAGNOSIS — I739 Peripheral vascular disease, unspecified: Secondary | ICD-10-CM

## 2015-01-25 DIAGNOSIS — I779 Disorder of arteries and arterioles, unspecified: Secondary | ICD-10-CM

## 2015-01-25 DIAGNOSIS — I059 Rheumatic mitral valve disease, unspecified: Secondary | ICD-10-CM

## 2015-01-25 DIAGNOSIS — I25810 Atherosclerosis of coronary artery bypass graft(s) without angina pectoris: Secondary | ICD-10-CM | POA: Diagnosis not present

## 2015-01-25 MED ORDER — DOXAZOSIN MESYLATE 8 MG PO TABS: ORAL_TABLET | ORAL | Status: DC

## 2015-01-25 MED ORDER — LOSARTAN POTASSIUM 100 MG PO TABS: 100.0000 mg | ORAL_TABLET | Freq: Every day | ORAL | Status: DC

## 2015-01-25 NOTE — Assessment & Plan Note (Signed)
Status post mitral valve repair. Continue SBE prophylaxis. No new murmurs on examination.

## 2015-01-25 NOTE — Patient Instructions (Signed)
Your physician wants you to follow-up in: ONE YEAR WITH DR CRENSHAW You will receive a reminder letter in the mail two months in advance. If you don't receive a letter, please call our office to schedule the follow-up appointment.  

## 2015-01-25 NOTE — Assessment & Plan Note (Signed)
Minimal plaque on most recent carotid Dopplers. Continue statin.

## 2015-01-25 NOTE — Assessment & Plan Note (Signed)
Continue low-dose statin. He is followed in the lipid clinic. Intolerant to higher doses.

## 2015-01-25 NOTE — Assessment & Plan Note (Signed)
Blood pressure is mildly elevated. However he follows this at home and his systolic is typically 379. Continue present medications and follow.

## 2015-01-25 NOTE — Assessment & Plan Note (Signed)
Continue statin. Not on aspirin given need for Coumadin. 

## 2015-03-06 ENCOUNTER — Ambulatory Visit (INDEPENDENT_AMBULATORY_CARE_PROVIDER_SITE_OTHER): Payer: Medicare Other | Admitting: *Deleted

## 2015-03-06 DIAGNOSIS — Z7901 Long term (current) use of anticoagulants: Secondary | ICD-10-CM

## 2015-03-06 DIAGNOSIS — Z5181 Encounter for therapeutic drug level monitoring: Secondary | ICD-10-CM | POA: Diagnosis not present

## 2015-03-06 DIAGNOSIS — I2699 Other pulmonary embolism without acute cor pulmonale: Secondary | ICD-10-CM | POA: Diagnosis not present

## 2015-03-06 DIAGNOSIS — I82409 Acute embolism and thrombosis of unspecified deep veins of unspecified lower extremity: Secondary | ICD-10-CM

## 2015-03-06 LAB — POCT INR: INR: 2.3

## 2015-04-13 ENCOUNTER — Other Ambulatory Visit: Payer: Self-pay | Admitting: Pulmonary Disease

## 2015-04-22 ENCOUNTER — Other Ambulatory Visit: Payer: Self-pay

## 2015-04-22 ENCOUNTER — Other Ambulatory Visit: Payer: Self-pay | Admitting: Cardiology

## 2015-04-22 MED ORDER — PRAVASTATIN SODIUM 20 MG PO TABS: 20.0000 mg | ORAL_TABLET | Freq: Every day | ORAL | Status: DC

## 2015-04-22 MED ORDER — METOPROLOL TARTRATE 50 MG PO TABS: 50.0000 mg | ORAL_TABLET | Freq: Two times a day (BID) | ORAL | Status: DC

## 2015-04-22 NOTE — Telephone Encounter (Signed)
Rx(s) sent to pharmacy electronically.  

## 2015-04-29 ENCOUNTER — Ambulatory Visit (INDEPENDENT_AMBULATORY_CARE_PROVIDER_SITE_OTHER): Payer: Medicare Other | Admitting: *Deleted

## 2015-04-29 DIAGNOSIS — Z7901 Long term (current) use of anticoagulants: Secondary | ICD-10-CM | POA: Diagnosis not present

## 2015-04-29 DIAGNOSIS — Z5181 Encounter for therapeutic drug level monitoring: Secondary | ICD-10-CM

## 2015-04-29 DIAGNOSIS — I2699 Other pulmonary embolism without acute cor pulmonale: Secondary | ICD-10-CM

## 2015-04-29 DIAGNOSIS — I82409 Acute embolism and thrombosis of unspecified deep veins of unspecified lower extremity: Secondary | ICD-10-CM

## 2015-04-29 LAB — POCT INR: INR: 2.3

## 2015-06-10 ENCOUNTER — Ambulatory Visit (INDEPENDENT_AMBULATORY_CARE_PROVIDER_SITE_OTHER): Payer: Medicare Other | Admitting: *Deleted

## 2015-06-10 DIAGNOSIS — I82409 Acute embolism and thrombosis of unspecified deep veins of unspecified lower extremity: Secondary | ICD-10-CM | POA: Diagnosis not present

## 2015-06-10 DIAGNOSIS — Z5181 Encounter for therapeutic drug level monitoring: Secondary | ICD-10-CM

## 2015-06-10 DIAGNOSIS — Z7901 Long term (current) use of anticoagulants: Secondary | ICD-10-CM

## 2015-06-10 DIAGNOSIS — I2699 Other pulmonary embolism without acute cor pulmonale: Secondary | ICD-10-CM | POA: Diagnosis not present

## 2015-06-10 LAB — POCT INR: INR: 1.8

## 2015-06-14 DIAGNOSIS — Z23 Encounter for immunization: Secondary | ICD-10-CM | POA: Diagnosis not present

## 2015-07-19 DIAGNOSIS — L821 Other seborrheic keratosis: Secondary | ICD-10-CM | POA: Diagnosis not present

## 2015-07-19 DIAGNOSIS — L814 Other melanin hyperpigmentation: Secondary | ICD-10-CM | POA: Diagnosis not present

## 2015-07-19 DIAGNOSIS — D225 Melanocytic nevi of trunk: Secondary | ICD-10-CM | POA: Diagnosis not present

## 2015-07-19 DIAGNOSIS — Z85828 Personal history of other malignant neoplasm of skin: Secondary | ICD-10-CM | POA: Diagnosis not present

## 2015-07-19 DIAGNOSIS — L570 Actinic keratosis: Secondary | ICD-10-CM | POA: Diagnosis not present

## 2015-07-19 DIAGNOSIS — D2261 Melanocytic nevi of right upper limb, including shoulder: Secondary | ICD-10-CM | POA: Diagnosis not present

## 2015-07-24 ENCOUNTER — Ambulatory Visit (INDEPENDENT_AMBULATORY_CARE_PROVIDER_SITE_OTHER): Payer: Medicare Other | Admitting: *Deleted

## 2015-07-24 ENCOUNTER — Other Ambulatory Visit (INDEPENDENT_AMBULATORY_CARE_PROVIDER_SITE_OTHER): Payer: Medicare Other | Admitting: *Deleted

## 2015-07-24 DIAGNOSIS — I2699 Other pulmonary embolism without acute cor pulmonale: Secondary | ICD-10-CM

## 2015-07-24 DIAGNOSIS — Z5181 Encounter for therapeutic drug level monitoring: Secondary | ICD-10-CM | POA: Diagnosis not present

## 2015-07-24 DIAGNOSIS — Z7901 Long term (current) use of anticoagulants: Secondary | ICD-10-CM | POA: Diagnosis not present

## 2015-07-24 DIAGNOSIS — I82409 Acute embolism and thrombosis of unspecified deep veins of unspecified lower extremity: Secondary | ICD-10-CM

## 2015-07-24 DIAGNOSIS — E785 Hyperlipidemia, unspecified: Secondary | ICD-10-CM | POA: Diagnosis not present

## 2015-07-24 LAB — HEPATIC FUNCTION PANEL
ALK PHOS: 94 U/L (ref 40–115)
ALT: 27 U/L (ref 9–46)
AST: 27 U/L (ref 10–35)
Albumin: 3.9 g/dL (ref 3.6–5.1)
BILIRUBIN INDIRECT: 0.4 mg/dL (ref 0.2–1.2)
BILIRUBIN TOTAL: 0.5 mg/dL (ref 0.2–1.2)
Bilirubin, Direct: 0.1 mg/dL (ref <=0.2)
TOTAL PROTEIN: 6.5 g/dL (ref 6.1–8.1)

## 2015-07-24 LAB — LIPID PANEL
Cholesterol: 163 mg/dL (ref 125–200)
HDL: 53 mg/dL (ref >=40)
LDL CALC: 97 mg/dL (ref <130)
TRIGLYCERIDES: 64 mg/dL (ref <150)
Total CHOL/HDL Ratio: 3.1 Ratio (ref <=5.0)
VLDL: 13 mg/dL (ref <30)

## 2015-07-24 LAB — POCT INR: INR: 2.5

## 2015-07-24 NOTE — Addendum Note (Signed)
Addended by: Domenica Reamer R on: 07/24/2015 07:57 AM   Modules accepted: Orders

## 2015-07-26 ENCOUNTER — Encounter: Payer: Self-pay | Admitting: *Deleted

## 2015-09-09 ENCOUNTER — Other Ambulatory Visit (INDEPENDENT_AMBULATORY_CARE_PROVIDER_SITE_OTHER): Payer: Medicare Other

## 2015-09-09 ENCOUNTER — Encounter: Payer: Self-pay | Admitting: Pulmonary Disease

## 2015-09-09 ENCOUNTER — Ambulatory Visit (INDEPENDENT_AMBULATORY_CARE_PROVIDER_SITE_OTHER): Payer: Medicare Other | Admitting: Pulmonary Disease

## 2015-09-09 VITALS — BP 148/76 | HR 76 | Temp 97.9°F | Resp 16 | Ht 69.0 in | Wt 184.0 lb

## 2015-09-09 DIAGNOSIS — B029 Zoster without complications: Secondary | ICD-10-CM | POA: Insufficient documentation

## 2015-09-09 DIAGNOSIS — C61 Malignant neoplasm of prostate: Secondary | ICD-10-CM

## 2015-09-09 DIAGNOSIS — M150 Primary generalized (osteo)arthritis: Secondary | ICD-10-CM

## 2015-09-09 DIAGNOSIS — Z86711 Personal history of pulmonary embolism: Secondary | ICD-10-CM | POA: Diagnosis not present

## 2015-09-09 DIAGNOSIS — K219 Gastro-esophageal reflux disease without esophagitis: Secondary | ICD-10-CM

## 2015-09-09 DIAGNOSIS — Z125 Encounter for screening for malignant neoplasm of prostate: Secondary | ICD-10-CM

## 2015-09-09 DIAGNOSIS — Z86718 Personal history of other venous thrombosis and embolism: Secondary | ICD-10-CM

## 2015-09-09 DIAGNOSIS — I480 Paroxysmal atrial fibrillation: Secondary | ICD-10-CM

## 2015-09-09 DIAGNOSIS — Z8719 Personal history of other diseases of the digestive system: Secondary | ICD-10-CM

## 2015-09-09 DIAGNOSIS — R1310 Dysphagia, unspecified: Secondary | ICD-10-CM

## 2015-09-09 DIAGNOSIS — I10 Essential (primary) hypertension: Secondary | ICD-10-CM | POA: Diagnosis not present

## 2015-09-09 DIAGNOSIS — Z9889 Other specified postprocedural states: Secondary | ICD-10-CM

## 2015-09-09 DIAGNOSIS — M159 Polyosteoarthritis, unspecified: Secondary | ICD-10-CM

## 2015-09-09 LAB — CBC WITH DIFFERENTIAL/PLATELET
BASOS ABS: 0 10*3/uL (ref 0.0–0.1)
Basophils Relative: 0.8 % (ref 0.0–3.0)
Eosinophils Absolute: 0.1 10*3/uL (ref 0.0–0.7)
Eosinophils Relative: 1.8 % (ref 0.0–5.0)
HCT: 41.7 % (ref 39.0–52.0)
Hemoglobin: 14 g/dL (ref 13.0–17.0)
LYMPHS ABS: 1.6 10*3/uL (ref 0.7–4.0)
Lymphocytes Relative: 27.1 % (ref 12.0–46.0)
MCHC: 33.7 g/dL (ref 30.0–36.0)
MCV: 83.3 fl (ref 78.0–100.0)
MONO ABS: 0.5 10*3/uL (ref 0.1–1.0)
MONOS PCT: 8.1 % (ref 3.0–12.0)
NEUTROS ABS: 3.6 10*3/uL (ref 1.4–7.7)
NEUTROS PCT: 62.2 % (ref 43.0–77.0)
PLATELETS: 158 10*3/uL (ref 150.0–400.0)
RBC: 5 Mil/uL (ref 4.22–5.81)
RDW: 14.2 % (ref 11.5–15.5)
WBC: 5.8 10*3/uL (ref 4.0–10.5)

## 2015-09-09 LAB — BASIC METABOLIC PANEL
BUN: 20 mg/dL (ref 6–23)
CALCIUM: 9.4 mg/dL (ref 8.4–10.5)
CO2: 27 mEq/L (ref 19–32)
Chloride: 107 mEq/L (ref 96–112)
Creatinine, Ser: 0.95 mg/dL (ref 0.40–1.50)
GFR: 81.63 mL/min (ref >60.00)
GLUCOSE: 119 mg/dL — AB (ref 70–99)
POTASSIUM: 4.6 meq/L (ref 3.5–5.1)
SODIUM: 140 meq/L (ref 135–145)

## 2015-09-09 LAB — PROTIME-INR
INR: 1.6 ratio — ABNORMAL HIGH (ref 0.8–1.0)
PROTHROMBIN TIME: 17.5 s — AB (ref 9.6–13.1)

## 2015-09-09 LAB — PSA: PSA: 0.12 ng/mL (ref 0.10–4.00)

## 2015-09-09 LAB — TSH: TSH: 3.42 u[IU]/mL (ref 0.35–4.50)

## 2015-09-09 MED ORDER — FAMCICLOVIR 500 MG PO TABS: 500.0000 mg | ORAL_TABLET | Freq: Three times a day (TID) | ORAL | Status: DC

## 2015-09-09 MED ORDER — PREDNISONE 5 MG (21) PO TBPK: ORAL_TABLET | ORAL | Status: AC

## 2015-09-09 NOTE — Progress Notes (Signed)
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    Take 1 tablet by mouth as  directed  Modified Medications   No medications on file  Discontinued Medications   No medications on file

## 2015-09-09 NOTE — Patient Instructions (Signed)
Today we updated your med list in our EPIC system...    Continue your current medications the same...  Today we checked yur follow up blood work...    We will contact you w/ the results when available...   For your Shingles (left hip area)>>    Start the FAMCYCLOVIR 500mg  - one tab three times daily til gone...    Start the Prednisone dosepak & take as directed on the box...  Call for any questions...  Let's plan a follow up visit in 44yr, sooner if needed for any problems.Marland KitchenMarland Kitchen

## 2015-09-10 ENCOUNTER — Ambulatory Visit (INDEPENDENT_AMBULATORY_CARE_PROVIDER_SITE_OTHER): Payer: Medicare Other | Admitting: Interventional Cardiology

## 2015-09-10 DIAGNOSIS — Z5181 Encounter for therapeutic drug level monitoring: Secondary | ICD-10-CM

## 2015-09-10 NOTE — Progress Notes (Signed)
Quick Note:  ATC pt at preferred contact number - no answer and no option to LM. WCB. ______

## 2015-09-13 ENCOUNTER — Ambulatory Visit (INDEPENDENT_AMBULATORY_CARE_PROVIDER_SITE_OTHER): Payer: Medicare Other | Admitting: Surgery

## 2015-09-13 DIAGNOSIS — Z5181 Encounter for therapeutic drug level monitoring: Secondary | ICD-10-CM | POA: Diagnosis not present

## 2015-09-13 DIAGNOSIS — I2699 Other pulmonary embolism without acute cor pulmonale: Secondary | ICD-10-CM | POA: Diagnosis not present

## 2015-09-13 DIAGNOSIS — I82409 Acute embolism and thrombosis of unspecified deep veins of unspecified lower extremity: Secondary | ICD-10-CM

## 2015-09-13 DIAGNOSIS — Z7901 Long term (current) use of anticoagulants: Secondary | ICD-10-CM

## 2015-09-13 LAB — POCT INR: INR: 2.1

## 2015-09-18 NOTE — Progress Notes (Signed)
Quick Note:  Called and spoke with pt. Reviewed results and recs. Pt voiced understanding and had no further questions. ______ 

## 2015-10-14 ENCOUNTER — Other Ambulatory Visit: Payer: Self-pay | Admitting: Cardiology

## 2015-10-14 NOTE — Telephone Encounter (Signed)
REFILL 

## 2015-10-25 ENCOUNTER — Ambulatory Visit (INDEPENDENT_AMBULATORY_CARE_PROVIDER_SITE_OTHER): Payer: Medicare Other | Admitting: *Deleted

## 2015-10-25 DIAGNOSIS — Z5181 Encounter for therapeutic drug level monitoring: Secondary | ICD-10-CM | POA: Diagnosis not present

## 2015-10-25 DIAGNOSIS — I2699 Other pulmonary embolism without acute cor pulmonale: Secondary | ICD-10-CM | POA: Diagnosis not present

## 2015-10-25 DIAGNOSIS — I82409 Acute embolism and thrombosis of unspecified deep veins of unspecified lower extremity: Secondary | ICD-10-CM | POA: Diagnosis not present

## 2015-10-25 DIAGNOSIS — Z7901 Long term (current) use of anticoagulants: Secondary | ICD-10-CM

## 2015-10-25 LAB — POCT INR: INR: 2.2

## 2015-11-27 ENCOUNTER — Other Ambulatory Visit: Payer: Self-pay | Admitting: Cardiology

## 2015-11-27 ENCOUNTER — Other Ambulatory Visit: Payer: Self-pay

## 2015-11-27 MED ORDER — DOXAZOSIN MESYLATE 8 MG PO TABS: ORAL_TABLET | ORAL | Status: DC

## 2015-11-28 ENCOUNTER — Other Ambulatory Visit: Payer: Self-pay | Admitting: Cardiology

## 2015-12-06 ENCOUNTER — Ambulatory Visit (INDEPENDENT_AMBULATORY_CARE_PROVIDER_SITE_OTHER): Payer: Medicare Other | Admitting: Pharmacist

## 2015-12-06 DIAGNOSIS — I2699 Other pulmonary embolism without acute cor pulmonale: Secondary | ICD-10-CM

## 2015-12-06 DIAGNOSIS — Z7901 Long term (current) use of anticoagulants: Secondary | ICD-10-CM

## 2015-12-06 DIAGNOSIS — I82409 Acute embolism and thrombosis of unspecified deep veins of unspecified lower extremity: Secondary | ICD-10-CM

## 2015-12-06 DIAGNOSIS — Z5181 Encounter for therapeutic drug level monitoring: Secondary | ICD-10-CM

## 2015-12-06 LAB — POCT INR: INR: 3

## 2015-12-16 ENCOUNTER — Other Ambulatory Visit: Payer: Self-pay | Admitting: Cardiology

## 2016-01-17 ENCOUNTER — Ambulatory Visit (INDEPENDENT_AMBULATORY_CARE_PROVIDER_SITE_OTHER): Payer: Medicare Other

## 2016-01-17 DIAGNOSIS — I2699 Other pulmonary embolism without acute cor pulmonale: Secondary | ICD-10-CM | POA: Diagnosis not present

## 2016-01-17 DIAGNOSIS — Z7901 Long term (current) use of anticoagulants: Secondary | ICD-10-CM

## 2016-01-17 DIAGNOSIS — Z5181 Encounter for therapeutic drug level monitoring: Secondary | ICD-10-CM

## 2016-01-17 DIAGNOSIS — I82409 Acute embolism and thrombosis of unspecified deep veins of unspecified lower extremity: Secondary | ICD-10-CM | POA: Diagnosis not present

## 2016-01-17 LAB — POCT INR: INR: 2.9

## 2016-01-20 DIAGNOSIS — H5213 Myopia, bilateral: Secondary | ICD-10-CM | POA: Diagnosis not present

## 2016-01-20 DIAGNOSIS — H2513 Age-related nuclear cataract, bilateral: Secondary | ICD-10-CM | POA: Diagnosis not present

## 2016-02-04 NOTE — Progress Notes (Signed)
HPI: FU coronary artery disease status post coronary bypassing graft, as well as mitral valve repair in January 2009. The patient had an Myoview performed on July 12, 2008. This was normal with an ejection fraction of 66%. Last echocardiogram in October of 2013 showed normal LV function, status post mitral valve repair with normal gradients and trace mitral regurgitation. There was mild left atrial enlargement. Last carotid Dopplers performed in October 2015 showed minimal plaque and fu recommended as needed. Since I last saw him, the patient denies any dyspnea on exertion, orthopnea, PND, pedal edema, palpitations, syncope or chest pain.    Current Outpatient Prescriptions  Medication Sig Dispense Refill  . amLODipine (NORVASC) 10 MG tablet Take one-half tablet by  mouth daily 45 tablet 2  . Coenzyme Q10 (CO Q-10) 100 MG CAPS Take 200 mg by mouth daily.  0  . doxazosin (CARDURA) 8 MG tablet Take 1 tablet (8 mg total)  by mouth at bedtime. 90 tablet 1  . ezetimibe (ZETIA) 10 MG tablet Take 1 tablet (10 mg total) by mouth daily. 90 tablet 3  . famciclovir (FAMVIR) 500 MG tablet Take 1 tablet (500 mg total) by mouth 3 (three) times daily. 21 tablet 0  . losartan (COZAAR) 100 MG tablet Take 1 tablet (100 mg total) by mouth daily. 90 tablet 3  . metoprolol (LOPRESSOR) 50 MG tablet Take 1 tablet by mouth two  times daily 180 tablet 3  . pantoprazole (PROTONIX) 40 MG tablet Take 1 tablet (40 mg total) by mouth daily. 90 tablet 3  . pravastatin (PRAVACHOL) 20 MG tablet Take 1 tablet (20 mg total) by mouth daily. 90 tablet 2  . warfarin (COUMADIN) 2.5 MG tablet Take 1 tablet by mouth as  directed 90 tablet 0   No current facility-administered medications for this visit.      Past Medical History:  Diagnosis Date  . Anxiety   . CAD (coronary artery disease)    s/p CABG  . Carotid stenosis   . Complication of anesthesia    zenkers  diverticulum  . Deep vein thrombophlebitis of leg (HCC)    . DJD (degenerative joint disease)   . Essential hypertension, benign   . GERD (gastroesophageal reflux disease)   . Hemorrhoids   . Hiatal hernia   . Hyperlipidemia   . PE (pulmonary embolism)   . Prostate cancer ([place])   . Sebaceous cyst    neck  . Seborrheic dermatitis   . Tubular adenoma of colon 05/2011    Past Surgical History:  Procedure Laterality Date  . bilateral shoulder surgery    . CORONARY ARTERY BYPASS GRAFT     x2    2009  . I&D EXTREMITY  07/09/2011   Procedure: IRRIGATION AND DEBRIDEMENT EXTREMITY;  Surgeon: Tennis Must;  Location: [place];  Service: Orthopedics;  Laterality: Left;  . left knee arthroscopy    . MITRAL VALVE REPAIR    . prostate cancer surgery  11/1996   radial retropubic prostatectomy  . TANDEM AND OVOID INSERTION    . VASECTOMY    . ZENKER'S DIVERTICULECTOMY N/A 11/07/2012   Procedure: [id number] DIVERTICULECTOMY ENDOSCOPIC;  Surgeon: Melida Quitter, MD;  Location: [place];  Service: ENT;  Laterality: N/A;    Social History   Social History  . Marital status: Married    Spouse name: Buyer, retail  . Number of children: 3  . Years of education: N/A   Occupational History  . retired  Retired   Social History Main Topics  . Smoking status: Former Smoker    Packs/day: 1.00    Years: 12.00    Types: Cigarettes    Quit date: 07/06/1968  . Smokeless tobacco: Never Used  . Alcohol use Yes     Comment: social use  . Drug use: No  . Sexual activity: Not on file   Other Topics Concern  . Not on file   Social History Narrative  . No narrative on file    Family History  Problem Relation Age of Onset  . Heart attack Father   . Stroke Mother   . Hypertension Mother   . Hypertension Brother   . Heart disease Brother   . Prostate cancer Brother     ROS: no fevers or chills, productive cough, hemoptysis, dysphasia, odynophagia, melena, hematochezia, dysuria, hematuria, rash, seizure activity, orthopnea, PND, pedal edema,  claudication. Remaining systems are negative.  Physical Exam: Well-developed well-nourished in no acute distress.  Skin is warm and dry.  HEENT is normal.  Neck is supple.  Chest is clear to auscultation with normal expansion.  Cardiovascular exam is regular rate and rhythm.  Abdominal exam nontender or distended. No masses palpated. Extremities show no edema. neuro grossly intact  ECG Sinus rhythm at a rate of 60. No ST changes.  A/P  1 Hyperlipidemia-continue low-dose statin. He is intolerant to higher doses.  2 hypertension-blood pressure controlled. Continue present medications.  3 coronary artery disease-continue statin. Not on aspirin given need for Coumadin.  4 history of mitral valve repair-continue SBE prophylaxis.  5 carotid artery disease-continue statin. Minimal plaque on most recent carotid Dopplers.  [name], MD

## 2016-02-10 ENCOUNTER — Encounter (INDEPENDENT_AMBULATORY_CARE_PROVIDER_SITE_OTHER): Payer: Self-pay

## 2016-02-10 ENCOUNTER — Ambulatory Visit (INDEPENDENT_AMBULATORY_CARE_PROVIDER_SITE_OTHER): Payer: Medicare Other | Admitting: Cardiology

## 2016-02-10 ENCOUNTER — Encounter: Payer: Self-pay | Admitting: Cardiology

## 2016-02-10 VITALS — BP 142/60 | HR 60 | Ht 69.0 in | Wt 179.6 lb

## 2016-02-10 DIAGNOSIS — E785 Hyperlipidemia, unspecified: Secondary | ICD-10-CM | POA: Diagnosis not present

## 2016-02-10 DIAGNOSIS — I10 Essential (primary) hypertension: Secondary | ICD-10-CM

## 2016-02-10 DIAGNOSIS — I480 Paroxysmal atrial fibrillation: Secondary | ICD-10-CM | POA: Diagnosis not present

## 2016-02-10 DIAGNOSIS — I679 Cerebrovascular disease, unspecified: Secondary | ICD-10-CM

## 2016-02-10 DIAGNOSIS — I2510 Atherosclerotic heart disease of native coronary artery without angina pectoris: Secondary | ICD-10-CM | POA: Diagnosis not present

## 2016-02-10 NOTE — Patient Instructions (Signed)
Medication Instructions:  Your physician recommends that you continue on your current medications as directed. Please refer to the Current Medication list given to you today.   Labwork: none  Testing/Procedures: none  Follow-Up: Your physician wants you to follow-up in: 12 months with Dr. Crenshaw. You will receive a reminder letter in the mail two months in advance. If you don't receive a letter, please call our office to schedule the follow-up appointment.   Any Other Special Instructions Will Be Listed Below (If Applicable).     If you need a refill on your cardiac medications before your next appointment, please call your pharmacy.   

## 2016-02-28 ENCOUNTER — Encounter: Payer: Self-pay | Admitting: Cardiology

## 2016-02-28 ENCOUNTER — Ambulatory Visit (INDEPENDENT_AMBULATORY_CARE_PROVIDER_SITE_OTHER): Payer: Medicare Other | Admitting: *Deleted

## 2016-02-28 DIAGNOSIS — I82409 Acute embolism and thrombosis of unspecified deep veins of unspecified lower extremity: Secondary | ICD-10-CM

## 2016-02-28 DIAGNOSIS — I2699 Other pulmonary embolism without acute cor pulmonale: Secondary | ICD-10-CM | POA: Diagnosis not present

## 2016-02-28 DIAGNOSIS — Z7901 Long term (current) use of anticoagulants: Secondary | ICD-10-CM

## 2016-02-28 DIAGNOSIS — Z5181 Encounter for therapeutic drug level monitoring: Secondary | ICD-10-CM

## 2016-02-28 LAB — POCT INR: INR: 2.8

## 2016-02-28 NOTE — Progress Notes (Signed)
HPI: FU coronary artery disease status post coronary bypassing graft, as well as mitral valve repair in January 2009. The patient had an Myoview performed on July 12, 2008. This was normal with an ejection fraction of 66%. Last echocardiogram in October of 2013 showed normal LV function, status post mitral valve repair with normal gradients and trace mitral regurgitation. There was mild left atrial enlargement. Last carotid Dopplers performed in October 2015 showed minimal plaque and fu recommended as needed. Since I last saw him, The patient has had atrial fibrillation. Last Tuesday he had 2 hours of diaphoresis, dyspnea and tightness in his chest and shoulders. This recurred beginning Friday until he went to the emergency room yesterday and was found to be in atrial fibrillation. He is on chronic Coumadin and he was therefore cardioverted. He now feels well. Otherwise denies dyspnea on exertion, chest pain or syncope.  Current Outpatient Prescriptions  Medication Sig Dispense Refill  . amLODipine (NORVASC) 10 MG tablet Take one-half tablet by  mouth daily (Patient taking differently: Take one-half tablet by  mouth every evening) 45 tablet 2  . amoxicillin (AMOXIL) 500 MG capsule 2,000 mg.    . Coenzyme Q10 (CO Q-10) 100 MG CAPS Take 200 mg by mouth daily. (Patient taking differently: Take 200 mg by mouth at bedtime. )  0  . doxazosin (CARDURA) 8 MG tablet Take 1 tablet (8 mg total)  by mouth at bedtime. 90 tablet 1  . ezetimibe (ZETIA) 10 MG tablet Take 1 tablet (10 mg total) by mouth daily. (Patient taking differently: Take 10 mg by mouth at bedtime. ) 90 tablet 3  . losartan (COZAAR) 100 MG tablet Take 1 tablet (100 mg total) by mouth daily. 90 tablet 3  . metoprolol (LOPRESSOR) 50 MG tablet Take 1 tablet by mouth two  times daily 180 tablet 3  . pantoprazole (PROTONIX) 40 MG tablet Take 1 tablet (40 mg total) by mouth daily. (Patient taking differently: Take 40 mg by mouth at bedtime. )  90 tablet 3  . pravastatin (PRAVACHOL) 20 MG tablet Take 1 tablet (20 mg total) by mouth daily. 90 tablet 2  . warfarin (COUMADIN) 2.5 MG tablet Take 1 tablet by mouth as  directed (Patient taking differently: Take 1 tablet (2.5 mg) by mouth every evening as  directed 2.5 mg every Monday, 1.25 mg all other days) 90 tablet 0   No current facility-administered medications for this visit.      Past Medical History:  Diagnosis Date  . Anxiety   . CAD (coronary artery disease)    s/p CABG  . Carotid stenosis   . Complication of anesthesia    zenkers  diverticulum  . Deep vein thrombophlebitis of leg (HCC)   . DJD (degenerative joint disease)   . Essential hypertension, benign   . GERD (gastroesophageal reflux disease)   . Hemorrhoids   . Hiatal hernia   . Hyperlipidemia   . PE (pulmonary embolism)   . Prostate cancer ([place])   . Sebaceous cyst    neck  . Seborrheic dermatitis   . Tubular adenoma of colon 05/2011    Past Surgical History:  Procedure Laterality Date  . bilateral shoulder surgery    . CORONARY ARTERY BYPASS GRAFT     x2    2009  . I&D EXTREMITY  07/09/2011   Procedure: IRRIGATION AND DEBRIDEMENT EXTREMITY;  Surgeon: Tennis Must;  Location: [place];  Service: Orthopedics;  Laterality: Left;  .  left knee arthroscopy    . MITRAL VALVE REPAIR    . prostate cancer surgery  11/1996   radial retropubic prostatectomy  . TANDEM AND OVOID INSERTION    . VASECTOMY    . ZENKER'S DIVERTICULECTOMY N/A 11/07/2012   Procedure: [id number] DIVERTICULECTOMY ENDOSCOPIC;  Surgeon: Melida Quitter, MD;  Location: [place];  Service: ENT;  Laterality: N/A;    Social History   Social History  . Marital status: Married    Spouse name: Buyer, retail  . Number of children: 3  . Years of education: N/A   Occupational History  . retired Retired   Social History Main Topics  . Smoking status: Former Smoker    Packs/day: 1.00    Years: 12.00    Types: Cigarettes    Quit  date: 07/06/1968  . Smokeless tobacco: Never Used  . Alcohol use Yes     Comment: social use  . Drug use: No  . Sexual activity: Not on file   Other Topics Concern  . Not on file   Social History Narrative  . No narrative on file    Family History  Problem Relation Age of Onset  . Heart attack Father   . Stroke Mother   . Hypertension Mother   . Hypertension Brother   . Heart disease Brother   . Prostate cancer Brother     ROS: no fevers or chills, productive cough, hemoptysis, dysphasia, odynophagia, melena, hematochezia, dysuria, hematuria, rash, seizure activity, orthopnea, PND, pedal edema, claudication. Remaining systems are negative.  Physical Exam: Well-developed well-nourished in no acute distress.  Skin is warm and dry.  HEENT is normal.  Neck is supple.  Chest is clear to auscultation with normal expansion.  Cardiovascular exam is regular rate and rhythm.  Abdominal exam nontender or distended. No masses palpated. Extremities show no edema. neuro grossly intact  ECG Electrocardiogram August 27 showed atrial fibrillation with rapid ventricular response. Follow-up electrocardiogram showed he had converted to sinus rhythm.  A/P   1 Paroxysmal atrial fibrillation-the patient is in sinus rhythm status post cardioversion. Embolic risk factors include age greater than 64, hypertension and coronary artery disease. Chadsvasc 4. Continue Coumadin which he is on long-term. Increase metoprolol to 75 mg twice a day. Check echocardiogram and TSH. If he has more frequent episodes in the future we will need to consider an antiarrhythmic.  2 hypertension-Given that we are increasing metoprolol for atrial fibrillation I will discontinue amlodipine. Follow blood pressure and adjust regimen as needed.  3 coronary artery disease-continue statin. No aspirin given need for anticoagulation.  4 Hyperlipidemia-continue low-dose statin. He is intolerant to higher doses.  5 status post  mitral valve repair-continue SBE prophylaxis. Repeat echocardiogram.  6 carotid artery disease-mild on most recent study.  [name], MD

## 2016-02-29 ENCOUNTER — Other Ambulatory Visit: Payer: Self-pay | Admitting: Cardiology

## 2016-03-01 ENCOUNTER — Emergency Department (HOSPITAL_COMMUNITY)
Admission: EM | Admit: 2016-03-01 | Discharge: 2016-03-01 | Disposition: A | Discharge status: Home / Self Care | Payer: Medicare Other | Attending: Emergency Medicine | Admitting: Emergency Medicine

## 2016-03-01 ENCOUNTER — Emergency Department (HOSPITAL_COMMUNITY): Payer: Medicare Other

## 2016-03-01 ENCOUNTER — Encounter (HOSPITAL_COMMUNITY): Payer: Self-pay | Admitting: Emergency Medicine

## 2016-03-01 DIAGNOSIS — R079 Chest pain, unspecified: Secondary | ICD-10-CM | POA: Diagnosis not present

## 2016-03-01 DIAGNOSIS — Z79899 Other long term (current) drug therapy: Secondary | ICD-10-CM | POA: Diagnosis not present

## 2016-03-01 DIAGNOSIS — Z87891 Personal history of nicotine dependence: Secondary | ICD-10-CM | POA: Diagnosis not present

## 2016-03-01 DIAGNOSIS — I10 Essential (primary) hypertension: Secondary | ICD-10-CM | POA: Insufficient documentation

## 2016-03-01 DIAGNOSIS — Z951 Presence of aortocoronary bypass graft: Secondary | ICD-10-CM | POA: Insufficient documentation

## 2016-03-01 DIAGNOSIS — I2510 Atherosclerotic heart disease of native coronary artery without angina pectoris: Secondary | ICD-10-CM | POA: Insufficient documentation

## 2016-03-01 DIAGNOSIS — R000 Tachycardia, unspecified: Secondary | ICD-10-CM | POA: Diagnosis not present

## 2016-03-01 DIAGNOSIS — Z8546 Personal history of malignant neoplasm of prostate: Secondary | ICD-10-CM | POA: Insufficient documentation

## 2016-03-01 DIAGNOSIS — R0789 Other chest pain: Secondary | ICD-10-CM | POA: Diagnosis not present

## 2016-03-01 DIAGNOSIS — Z7901 Long term (current) use of anticoagulants: Secondary | ICD-10-CM | POA: Insufficient documentation

## 2016-03-01 DIAGNOSIS — I4891 Unspecified atrial fibrillation: Secondary | ICD-10-CM | POA: Diagnosis not present

## 2016-03-01 LAB — CBC
HEMATOCRIT: 41.1 % (ref 39.0–52.0)
Hemoglobin: 13.8 g/dL (ref 13.0–17.0)
MCH: 28.6 pg (ref 26.0–34.0)
MCHC: 33.6 g/dL (ref 30.0–36.0)
MCV: 85.3 fL (ref 78.0–100.0)
Platelets: 175 10*3/uL (ref 150–400)
RBC: 4.82 MIL/uL (ref 4.22–5.81)
RDW: 13.8 % (ref 11.5–15.5)
WBC: 5.5 10*3/uL (ref 4.0–10.5)

## 2016-03-01 LAB — PROTIME-INR
INR: 2.42
Prothrombin Time: 26.8 seconds — ABNORMAL HIGH (ref 11.4–15.2)

## 2016-03-01 LAB — MAGNESIUM: Magnesium: 2.3 mg/dL (ref 1.7–2.4)

## 2016-03-01 LAB — BASIC METABOLIC PANEL
Anion gap: 8 (ref 5–15)
BUN: 12 mg/dL (ref 6–20)
CHLORIDE: 109 mmol/L (ref 101–111)
CO2: 23 mmol/L (ref 22–32)
Calcium: 8.8 mg/dL — ABNORMAL LOW (ref 8.9–10.3)
Creatinine, Ser: 1.14 mg/dL (ref 0.61–1.24)
GFR calc Af Amer: >60 mL/min (ref >60)
GFR calc non Af Amer: >60 mL/min (ref >60)
GLUCOSE: 181 mg/dL — AB (ref 65–99)
POTASSIUM: 4.2 mmol/L (ref 3.5–5.1)
Sodium: 140 mmol/L (ref 135–145)

## 2016-03-01 LAB — I-STAT TROPONIN, ED
Troponin i, poc: 0 ng/mL (ref 0.00–0.08)
Troponin i, poc: 0 ng/mL (ref 0.00–0.08)

## 2016-03-01 MED ORDER — PROPOFOL 10 MG/ML IV BOLUS
0.5000 mg/kg | INTRAVENOUS | Status: DC | PRN
  Administered 2016-03-01: 39.7 mg via INTRAVENOUS
  Filled 2016-03-01: qty 20

## 2016-03-01 MED ORDER — ASPIRIN 81 MG PO CHEW
324.0000 mg | CHEWABLE_TABLET | Freq: Once | ORAL | Status: AC
  Administered 2016-03-01: 324 mg via ORAL
  Filled 2016-03-01: qty 4

## 2016-03-01 MED ORDER — METOPROLOL TARTRATE 5 MG/5ML IV SOLN
5.0000 mg | INTRAVENOUS | Status: AC | PRN
Start: 2016-03-01 — End: 2016-03-01
  Administered 2016-03-01 (×2): 5 mg via INTRAVENOUS
  Filled 2016-03-01 (×2): qty 5

## 2016-03-01 NOTE — Sedation Documentation (Addendum)
Fully alert, oriented and back to pre-procedure baseline.

## 2016-03-01 NOTE — ED Triage Notes (Signed)
Pt here with CP starting 1 week ago. Pt reports increasing anxiety and diaphoresis, SOB associated with CP. Pt denies radiation.

## 2016-03-01 NOTE — ED Provider Notes (Addendum)
[facility] DEPT Provider Note   CSN: [id number] Arrival date & time: 03/01/16  1759     History   Chief Complaint Chief Complaint  Patient presents with  . Chest Pain    HPI John [name] is a 80 y.o. male.  HPI Pt started having trouble with with dizziness and palpiations this past week.  It increases whenever he is active.  WHen he is at rest he feels fine.   He has been having some pressure in his neck and shoulders and will get clammy.  His heart rate was up to the 120s.  His BP has been fluctuating.  He has not had any syncopal episodes.   No pain in his chest but he does have some slight pressure.  He is due to see his cardiologist this coming week but because of his increasing sx he came to the ED.  Hx of CAD sp CABG.  He also has history of valve repair. Chads Vasc 2 Past Medical History:  Diagnosis Date  . Anxiety   . CAD (coronary artery disease)    s/p CABG  . Carotid stenosis   . Complication of anesthesia    zenkers  diverticulum  . Deep vein thrombophlebitis of leg (HCC)   . DJD (degenerative joint disease)   . Essential hypertension, benign   . GERD (gastroesophageal reflux disease)   . Hemorrhoids   . Hiatal hernia   . Hyperlipidemia   . PE (pulmonary embolism)   . Prostate cancer ([place])   . Sebaceous cyst    neck  . Seborrheic dermatitis   . Tubular adenoma of colon 05/2011    Patient Active Problem List   Diagnosis Date Noted  . Shingles 09/09/2015  . History of DVT (deep vein thrombosis) 09/21/2014  . History of excision of Zenker's diverticulum 10/04/2013  . Encounter for therapeutic drug monitoring 09/14/2013  . Zenker's diverticulum 09/12/2012  . Dysphagia 03/14/2012  . Bronchitis, acute 10/30/2011  . Colon polyps 09/07/2011  . CAD (coronary artery disease) 03/17/2011  . Panic anxiety syndrome 03/10/2011  . Long term (current) use of anticoagulants 08/31/2010  . Mitral valve disorder 12/10/2009  . PROSTATE CANCER 04/25/2009  .  History of pulmonary embolism 04/25/2009  . DEEP VENOUS THROMBOPHLEBITIS 04/25/2009  . HEMORRHOIDS 04/25/2009  . GASTROESOPHAGEAL REFLUX DISEASE 04/25/2009  . SEBORRHEIC DERMATITIS 04/25/2009  . SEBACEOUS CYST, NECK 04/25/2009  . Osteoarthritis 04/25/2009  . Orthostatic hypotension 04/24/2009  . NECK PAIN 04/24/2009  . DIZZINESS 04/24/2009  . CHEST PAIN UNSPECIFIED 04/24/2009  . ESSENTIAL HYPERTENSION, BENIGN 01/10/2009  . Carotid artery disease ([place]) 01/10/2009  . MITRAL VALVE REPLACEMENT, HX OF 01/10/2009  . Hyperlipemia 10/25/2008  . CAD, ARTERY BYPASS GRAFT 10/25/2008  . AORTIC STENOSIS, MILD 10/25/2008  . ATRIAL FIBRILLATION 10/25/2008    Past Surgical History:  Procedure Laterality Date  . bilateral shoulder surgery    . CORONARY ARTERY BYPASS GRAFT     x2    2009  . I&D EXTREMITY  07/09/2011   Procedure: IRRIGATION AND DEBRIDEMENT EXTREMITY;  Surgeon: Tennis Must;  Location: [facility];  Service: Orthopedics;  Laterality: Left;  . left knee arthroscopy    . MITRAL VALVE REPAIR    . prostate cancer surgery  11/1996   radial retropubic prostatectomy  . TANDEM AND OVOID INSERTION    . VASECTOMY    . ZENKER'S DIVERTICULECTOMY N/A 11/07/2012   Procedure: [id number] DIVERTICULECTOMY ENDOSCOPIC;  Surgeon: Melida Quitter, MD;  Location: [facility];  Service:  ENT;  Laterality: N/A;       Home Medications    Prior to Admission medications   Medication Sig Start Date End Date Taking? Authorizing Provider  amLODipine (NORVASC) 10 MG tablet Take one-half tablet by  mouth daily Patient taking differently: Take one-half tablet by  mouth every evening 04/22/15  Yes Lelon Perla, MD  amoxicillin (AMOXIL) 500 MG capsule 2,000 mg. 02/27/16  Yes Historical Provider, MD  Coenzyme Q10 (CO Q-10) 100 MG CAPS Take 200 mg by mouth daily. Patient taking differently: Take 200 mg by mouth at bedtime.  10/10/13  Yes Noralee Space, MD  doxazosin (CARDURA) 8 MG tablet Take 1 tablet (8 mg  total)  by mouth at bedtime. 11/27/15  Yes Lelon Perla, MD  ezetimibe (ZETIA) 10 MG tablet Take 1 tablet (10 mg total) by mouth daily. Patient taking differently: Take 10 mg by mouth at bedtime.  10/10/13  Yes Noralee Space, MD  losartan (COZAAR) 100 MG tablet Take 1 tablet (100 mg total) by mouth daily. 01/25/15  Yes Lelon Perla, MD  metoprolol (LOPRESSOR) 50 MG tablet Take 1 tablet by mouth two  times daily 12/16/15  Yes Lelon Perla, MD  pantoprazole (PROTONIX) 40 MG tablet Take 1 tablet (40 mg total) by mouth daily. Patient taking differently: Take 40 mg by mouth at bedtime.  10/04/13 09/08/16 Yes Noralee Space, MD  pravastatin (PRAVACHOL) 20 MG tablet Take 1 tablet (20 mg total) by mouth daily. 04/22/15  Yes Lelon Perla, MD  warfarin (COUMADIN) 2.5 MG tablet Take 1 tablet by mouth as  directed Patient taking differently: Take 1 tablet (2.5 mg) by mouth every evening as  directed 2.5 mg every Monday, 1.25 mg all other days 11/28/15  Yes Lelon Perla, MD    Family History Family History  Problem Relation Age of Onset  . Heart attack Father   . Stroke Mother   . Hypertension Mother   . Hypertension Brother   . Heart disease Brother   . Prostate cancer Brother     Social History Social History  Substance Use Topics  . Smoking status: Former Smoker    Packs/day: 1.00    Years: 12.00    Types: Cigarettes    Quit date: 07/06/1968  . Smokeless tobacco: Never Used  . Alcohol use Yes     Comment: social use     Allergies   Iodine; Iohexol; and Statins   Review of Systems Review of Systems  All other systems reviewed and are negative.    Physical Exam Updated Vital Signs BP 149/98   Pulse 118   Temp 97.8 F (36.6 C) (Oral)   Resp 16   Ht 5\' 9"  (1.753 m)   Wt 79.4 kg   SpO2 96%   BMI 25.84 kg/m   Physical Exam  Constitutional: He appears well-nourished. No distress.  HENT:  Head: Normocephalic and atraumatic.  Right Ear: External ear normal.  Left  Ear: External ear normal.  Eyes: Conjunctivae are normal. Right eye exhibits no discharge. Left eye exhibits no discharge. No scleral icterus.  Neck: Neck supple. No tracheal deviation present.  Cardiovascular: Intact distal pulses.  An irregularly irregular rhythm present. Tachycardia present.   Pulmonary/Chest: Effort normal and breath sounds normal. No stridor. No respiratory distress. He has no wheezes. He has no rales.  Abdominal: Soft. Bowel sounds are normal. He exhibits no distension. There is no tenderness. There is no rebound and no guarding.  Musculoskeletal: He exhibits no edema or tenderness.  Neurological: He is alert. He has normal strength. No cranial nerve deficit (no facial droop, extraocular movements intact, no slurred speech) or sensory deficit. He exhibits normal muscle tone. He displays no seizure activity. Coordination normal.  Skin: Skin is warm and dry. No rash noted.  Psychiatric: He has a normal mood and affect.  Nursing note and vitals reviewed.    ED Treatments / Results  Labs (all labs ordered are listed, but only abnormal results are displayed) Labs Reviewed  BASIC METABOLIC PANEL - Abnormal; Notable for the following:       Result Value   Glucose, Bld 181 (*)    Calcium 8.8 (*)    All other components within normal limits  PROTIME-INR - Abnormal; Notable for the following:    Prothrombin Time 26.8 (*)    All other components within normal limits  CBC  MAGNESIUM  I-STAT TROPOININ, ED  I-STAT TROPOININ, ED    EKG  EKG Interpretation  Date/Time:  Sunday March 01 2016 18:08:48 EDT Ventricular Rate:  133 PR Interval:    QRS Duration: 88 QT Interval:  324 QTC Calculation: 482 R Axis:   81 Text Interpretation:  Atrial fibrillation with rapid ventricular response Nonspecific ST abnormality Abnormal ECG No significant change since last tracing Confirmed by Roniqua Kintz  MD-J, Ambry Dix ([id number]) on 03/01/2016 6:23:35 PM       EKG  Interpretation  Date/Time:  Sunday March 01 2016 21:41:41 EDT Ventricular Rate:  61 PR Interval:  162 QRS Duration: 94 QT Interval:  456 QTC Calculation: 459 R Axis:   62 Text Interpretation:  Normal sinus rhythm Normal ECG atrial fibrillation has resolved since last tracing Confirmed by Denijah Karrer  MD-J, Reid Nawrot ([id number]) on 03/01/2016 9:45:30 PM        Radiology Dg Chest Portable 1 View  Result Date: 03/01/2016 CLINICAL DATA:  Chest pain. History of coronary artery disease and pulmonary embolus. EXAM: PORTABLE CHEST 1 VIEW COMPARISON:  09/21/2014 FINDINGS: Postoperative changes in the mediastinum. Shallow inspiration with linear atelectasis in the lung bases. No focal airspace disease or consolidation. No blunting of costophrenic angles. No pneumothorax. Calcification of the aorta. Mediastinal contours appear intact. IMPRESSION: Shallow inspiration with mild linear atelectasis in the lung bases. Electronically Signed   By: Lucienne Capers M.D.   On: 03/01/2016 19:09    Procedures .Sedation Date/Time: 03/01/2016 8:54 PM Performed by: Dorie Rank Authorized by: Dorie Rank   Consent:    Consent obtained:  Written   Consent given by:  Patient   Risks discussed:  Allergic reaction, inadequate sedation, nausea, prolonged hypoxia resulting in organ damage, prolonged sedation necessitating reversal, respiratory compromise necessitating ventilatory assistance and intubation and vomiting   Alternatives discussed:  Analgesia without sedation Indications:    Procedure performed:  Cardioversion   Procedure necessitating sedation performed by:  Different physician   Intended level of sedation:  Deep Pre-sedation assessment:    Time since last food or drink:  5   ASA classification: class 2 - patient with mild systemic disease     Neck mobility: normal     Mouth opening:  3 or more finger widths   Mallampati score:  II - soft palate, uvula, fauces visible   Pre-sedation assessments completed and  reviewed: airway patency, cardiovascular function, hydration status, mental status, nausea/vomiting, pain level, respiratory function and temperature     History of difficult intubation: no     Pre-sedation assessment completed:  03/01/2016 8:55 PM Immediate  pre-procedure details:    Reviewed: vital signs     Verified: bag valve mask available and emergency equipment available   Procedure details (see MAR for exact dosages):    Sedation start time:  03/01/2016 9:16 PM   Preoxygenation:  Nasal cannula   Sedation:  Propofol   Intra-procedure monitoring:  Cardiac monitor, blood pressure monitoring, continuous pulse oximetry, frequent LOC assessments and continuous capnometry   Intra-procedure events: none     Sedation end time:  03/01/2016 9:46 PM Post-procedure details:    Post-sedation assessment completed:  03/01/2016 9:46 PM   Attendance: Constant attendance by certified staff until patient recovered     Recovery: Patient returned to pre-procedure baseline     Estimated blood loss (see I/O flowsheets): no     Complications:  None   Post-sedation assessments completed and reviewed: airway patency, cardiovascular function, hydration status, mental status, nausea/vomiting, respiratory function and temperature     Patient is stable for discharge or admission: yes     Patient tolerance:  Tolerated well, no immediate complications Comments:     Converted back to NSR.      (including critical care time)  Medications Ordered in ED Medications  metoprolol (LOPRESSOR) injection 5 mg (5 mg Intravenous Given 03/01/16 1859)  aspirin chewable tablet 324 mg (324 mg Oral Given 03/01/16 1859)     Initial Impression / Assessment and Plan / ED Course  I have reviewed the triage vital signs and the nursing notes.  Pertinent labs & imaging results that were available during my care of the patient were reviewed by me and considered in my medical decision making (see chart for details).  Clinical Course   Comment By Time  Pt is still tachcyardic.  Will give additional dose of metoprolol.  Initial troponin negative.  Sx may be related to his a fib vs anginal type sx.  Will consult with cardiology. Dorie Rank, MD 08/27 2010  D/w Dr Marlowe Sax, cardiology.  Will see in the ED Dorie Rank, MD 08/27 2042    The patient was given a dose of metoprolol. He remains tachycardic. I have ordered additional doses. Patient presents exertional chest discomfort. May be related to his paroxysmal atrial fibrillation or could be a component of angina.  I will consult with cardiology.  Dr Marlowe Sax recommended cardioversion.  I performed the sedation and he directed the cardioversion.  Pt successfully converted with 200j.  Pt recovered without difficulty.  He feels well.  Has remained in SR.  He will see Dr Stanford Breed tomorrow as planned.  Final Clinical Impressions(s) / ED Diagnoses   Final diagnoses:  Nonspecific chest pain  Atrial fibrillation with [facility])    New Prescriptions New Prescriptions   No medications on file      Dorie Rank, MD 03/01/16 2211

## 2016-03-01 NOTE — Sedation Documentation (Signed)
Shock for cardioversion delivered by Cardiology at 2128

## 2016-03-02 ENCOUNTER — Ambulatory Visit (INDEPENDENT_AMBULATORY_CARE_PROVIDER_SITE_OTHER): Payer: Medicare Other | Admitting: Cardiology

## 2016-03-02 ENCOUNTER — Encounter: Payer: Self-pay | Admitting: Cardiology

## 2016-03-02 VITALS — BP 132/56 | HR 60 | Ht 69.0 in | Wt 182.0 lb

## 2016-03-02 DIAGNOSIS — Z0000 Encounter for general adult medical examination without abnormal findings: Secondary | ICD-10-CM | POA: Diagnosis not present

## 2016-03-02 DIAGNOSIS — Z79899 Other long term (current) drug therapy: Secondary | ICD-10-CM | POA: Diagnosis not present

## 2016-03-02 DIAGNOSIS — I10 Essential (primary) hypertension: Secondary | ICD-10-CM

## 2016-03-02 DIAGNOSIS — I4891 Unspecified atrial fibrillation: Secondary | ICD-10-CM | POA: Diagnosis not present

## 2016-03-02 DIAGNOSIS — I2510 Atherosclerotic heart disease of native coronary artery without angina pectoris: Secondary | ICD-10-CM | POA: Diagnosis not present

## 2016-03-02 LAB — TSH: TSH: 3.41 mIU/L (ref 0.40–4.50)

## 2016-03-02 MED ORDER — METOPROLOL TARTRATE 50 MG PO TABS: 75.0000 mg | ORAL_TABLET | Freq: Two times a day (BID) | ORAL | 3 refills | Status: DC

## 2016-03-02 NOTE — Patient Instructions (Signed)
Medication Instructions:  Your physician has recommended you make the following change in your medication:  1- CHANGE Metoprolol to 75 mg (1.5 tablets) by mouth twice a day. 2- STOP taking your Amlodipine.   Labwork: Your physician recommends that you return for lab work in: [name].   Testing/Procedures: Your physician has requested that you have an echocardiogram. Echocardiography is a painless test that uses sound waves to create images of your heart. It provides your doctor with information about the size and shape of your heart and how well your heart's chambers and valves are working. This procedure takes approximately one hour. There are no restrictions for this procedure.    Follow-Up: Your physician recommends that you schedule a follow-up appointment in: [name].   If you need a refill on your cardiac medications before your next appointment, please call your pharmacy.

## 2016-03-02 NOTE — Telephone Encounter (Signed)
Rx(s) sent to pharmacy electronically.  

## 2016-03-19 ENCOUNTER — Telehealth: Payer: Self-pay | Admitting: Cardiology

## 2016-03-19 ENCOUNTER — Other Ambulatory Visit: Payer: Self-pay

## 2016-03-19 ENCOUNTER — Ambulatory Visit (HOSPITAL_COMMUNITY): Payer: Medicare Other | Attending: Cardiology

## 2016-03-19 DIAGNOSIS — I272 Other secondary pulmonary hypertension: Secondary | ICD-10-CM | POA: Insufficient documentation

## 2016-03-19 DIAGNOSIS — I340 Nonrheumatic mitral (valve) insufficiency: Secondary | ICD-10-CM | POA: Insufficient documentation

## 2016-03-19 DIAGNOSIS — I4891 Unspecified atrial fibrillation: Secondary | ICD-10-CM | POA: Insufficient documentation

## 2016-03-19 DIAGNOSIS — I10 Essential (primary) hypertension: Secondary | ICD-10-CM

## 2016-03-19 DIAGNOSIS — Z79899 Other long term (current) drug therapy: Secondary | ICD-10-CM | POA: Diagnosis not present

## 2016-03-19 DIAGNOSIS — I2510 Atherosclerotic heart disease of native coronary artery without angina pectoris: Secondary | ICD-10-CM | POA: Diagnosis not present

## 2016-03-19 DIAGNOSIS — I517 Cardiomegaly: Secondary | ICD-10-CM | POA: Diagnosis not present

## 2016-03-19 DIAGNOSIS — I351 Nonrheumatic aortic (valve) insufficiency: Secondary | ICD-10-CM | POA: Insufficient documentation

## 2016-03-19 DIAGNOSIS — I050 Rheumatic mitral stenosis: Secondary | ICD-10-CM | POA: Diagnosis not present

## 2016-03-19 MED ORDER — AMLODIPINE BESYLATE 5 MG PO TABS: 5.0000 mg | ORAL_TABLET | Freq: Every day | ORAL | 3 refills | Status: DC

## 2016-03-19 MED ORDER — AMLODIPINE BESYLATE 5 MG PO TABS: 5.0000 mg | ORAL_TABLET | Freq: Every day | ORAL | Status: DC

## 2016-03-19 NOTE — Telephone Encounter (Addendum)
Discussed with dr Stanford Breed, pt to restart amlodipine 5 mg once daily. Patient voiced understanding to track his bp and let us know if his numbers do not get below 140/90. New script sent to the pharmacy

## 2016-03-19 NOTE — Progress Notes (Unsigned)
Talked to Clinical manager terry about B/P

## 2016-03-19 NOTE — Telephone Encounter (Signed)
Spoke with pt, after he returned home his bp is down to 147/58. He is feeling a lot better. He does report his bp is running above 140 consistently. Will discuss with dr Stanford Breed.

## 2016-03-19 NOTE — Telephone Encounter (Signed)
John [name] is calling because his blood pressure is running 215/94 in right arm and 210/84 in the left arm . Please call   Thanks

## 2016-03-19 NOTE — Telephone Encounter (Addendum)
Returned call to patient-pt reports he went for his Echo this AM and his BP was running high.  Right arm 215-94 Left arm-210/84, reports taking his AM medications before going for test-metoprolol 75mg  and losartan 100mg .  Reports face feeling "kind of numb", denies CP, SOB, HA, vision changes, weakness, confusion.  Reports attempting to take BP now that he is at home but his machine is giving him and error.  Took to MD Crenshaw in office-will address.

## 2016-04-10 ENCOUNTER — Ambulatory Visit (INDEPENDENT_AMBULATORY_CARE_PROVIDER_SITE_OTHER): Payer: Medicare Other | Admitting: *Deleted

## 2016-04-10 DIAGNOSIS — I82409 Acute embolism and thrombosis of unspecified deep veins of unspecified lower extremity: Secondary | ICD-10-CM | POA: Diagnosis not present

## 2016-04-10 DIAGNOSIS — I2699 Other pulmonary embolism without acute cor pulmonale: Secondary | ICD-10-CM | POA: Diagnosis not present

## 2016-04-10 DIAGNOSIS — Z7901 Long term (current) use of anticoagulants: Secondary | ICD-10-CM | POA: Diagnosis not present

## 2016-04-10 DIAGNOSIS — Z5181 Encounter for therapeutic drug level monitoring: Secondary | ICD-10-CM

## 2016-04-10 LAB — POCT INR: INR: 2.6

## 2016-04-15 DIAGNOSIS — Z23 Encounter for immunization: Secondary | ICD-10-CM | POA: Diagnosis not present

## 2016-04-23 ENCOUNTER — Other Ambulatory Visit: Payer: Self-pay | Admitting: Cardiology

## 2016-04-23 NOTE — Telephone Encounter (Signed)
refill 

## 2016-04-24 ENCOUNTER — Encounter: Payer: Self-pay | Admitting: Gastroenterology

## 2016-05-22 ENCOUNTER — Ambulatory Visit (INDEPENDENT_AMBULATORY_CARE_PROVIDER_SITE_OTHER): Payer: Medicare Other | Admitting: *Deleted

## 2016-05-22 DIAGNOSIS — Z7901 Long term (current) use of anticoagulants: Secondary | ICD-10-CM

## 2016-05-22 DIAGNOSIS — Z5181 Encounter for therapeutic drug level monitoring: Secondary | ICD-10-CM

## 2016-05-22 DIAGNOSIS — I2510 Atherosclerotic heart disease of native coronary artery without angina pectoris: Secondary | ICD-10-CM

## 2016-05-22 DIAGNOSIS — I2699 Other pulmonary embolism without acute cor pulmonale: Secondary | ICD-10-CM

## 2016-05-22 DIAGNOSIS — I82409 Acute embolism and thrombosis of unspecified deep veins of unspecified lower extremity: Secondary | ICD-10-CM | POA: Diagnosis not present

## 2016-05-22 LAB — POCT INR: INR: 2.9

## 2016-06-09 NOTE — Progress Notes (Signed)
HPI: FU coronary artery disease status post coronary bypassing graft, as well as mitral valve repair in January 2009. The patient had an Myoview performed on July 12, 2008. This was normal with an ejection fraction of 66%. Last carotid Dopplers performed in October 2015 showed minimal plaque and fu recommended as needed. Echo 9/17 showed normal LV function; s/p MV repair with mild MS and mild MR; moderate LAE; mild to moderate RVE; mild RAE. Pt also with h/o atrial fibrillation requiring DCCV. Since last seen, the patient denies any dyspnea on exertion, orthopnea, PND, pedal edema, palpitations, syncope or chest pain.   Current Outpatient Prescriptions  Medication Sig Dispense Refill  . amLODipine (NORVASC) 5 MG tablet Take 1 tablet (5 mg total) by mouth daily. 90 tablet 3  . amoxicillin (AMOXIL) 500 MG capsule 2,000 mg.    . Coenzyme Q10 (CO Q-10) 100 MG CAPS Take 200 mg by mouth daily. (Patient taking differently: Take 200 mg by mouth at bedtime. )  0  . doxazosin (CARDURA) 8 MG tablet TAKE 1 TABLET BY MOUTH AT  BEDTIME 90 tablet 0  . ezetimibe (ZETIA) 10 MG tablet Take 1 tablet (10 mg total) by mouth daily. (Patient taking differently: Take 10 mg by mouth at bedtime. ) 90 tablet 3  . losartan (COZAAR) 100 MG tablet Take 1 tablet by mouth  daily 90 tablet 4  . metoprolol (LOPRESSOR) 50 MG tablet Take 1.5 tablets (75 mg total) by mouth 2 (two) times daily. 270 tablet 3  . pantoprazole (PROTONIX) 40 MG tablet Take 1 tablet (40 mg total) by mouth daily. (Patient taking differently: Take 40 mg by mouth at bedtime. ) 90 tablet 3  . pravastatin (PRAVACHOL) 20 MG tablet Take 1 tablet by mouth  daily 90 tablet 4  . warfarin (COUMADIN) 2.5 MG tablet Take 1 tablet by mouth as  directed (Patient taking differently: Take 1 tablet (2.5 mg) by mouth every evening as  directed 2.5 mg every Monday, 1.25 mg all other days) 90 tablet 0   No current facility-administered medications for this visit.       Past Medical History:  Diagnosis Date  . Anxiety   . CAD (coronary artery disease)    s/p CABG  . Carotid stenosis   . Complication of anesthesia    zenkers  diverticulum  . Deep vein thrombophlebitis of leg (HCC)   . DJD (degenerative joint disease)   . Essential hypertension, benign   . GERD (gastroesophageal reflux disease)   . Hemorrhoids   . Hiatal hernia   . Hyperlipidemia   . PE (pulmonary embolism)   . Prostate cancer ([place])   . Sebaceous cyst    neck  . Seborrheic dermatitis   . Tubular adenoma of colon 05/2011    Past Surgical History:  Procedure Laterality Date  . bilateral shoulder surgery    . CORONARY ARTERY BYPASS GRAFT     x2    2009  . I&D EXTREMITY  07/09/2011   Procedure: IRRIGATION AND DEBRIDEMENT EXTREMITY;  Surgeon: Tennis Must;  Location: [facility];  Service: Orthopedics;  Laterality: Left;  . left knee arthroscopy    . MITRAL VALVE REPAIR    . prostate cancer surgery  11/1996   radial retropubic prostatectomy  . TANDEM AND OVOID INSERTION    . VASECTOMY    . ZENKER'S DIVERTICULECTOMY N/A 11/07/2012   Procedure: [id number] DIVERTICULECTOMY ENDOSCOPIC;  Surgeon: Melida Quitter, MD;  Location: [facility];  Service: ENT;  Laterality: N/A;    Social History   Social History  . Marital status: Married    Spouse name: Buyer, retail  . Number of children: 3  . Years of education: N/A   Occupational History  . retired Retired   Social History Main Topics  . Smoking status: Former Smoker    Packs/day: 1.00    Years: 12.00    Types: Cigarettes    Quit date: 07/06/1968  . Smokeless tobacco: Never Used  . Alcohol use Yes     Comment: social use  . Drug use: No  . Sexual activity: Not on file   Other Topics Concern  . Not on file   Social History Narrative  . No narrative on file    Family History  Problem Relation Age of Onset  . Heart attack Father   . Stroke Mother   . Hypertension Mother   . Hypertension Brother   . Heart  disease Brother   . Prostate cancer Brother     ROS: no fevers or chills, productive cough, hemoptysis, dysphasia, odynophagia, melena, hematochezia, dysuria, hematuria, rash, seizure activity, orthopnea, PND, pedal edema, claudication. Remaining systems are negative.  Physical Exam: Well-developed well-nourished in no acute distress.  Skin is warm and dry.  HEENT is normal.  Neck is supple.  Chest is clear to auscultation with normal expansion.  Cardiovascular exam is regular rate and rhythm.  Abdominal exam nontender or distended. No masses palpated. Extremities show no edema. neuro grossly intact  A/P  1 1 Paroxysmal atrial fibrillation-the patient is in sinus rhythm on exam today. Embolic risk factors include age greater than 17, hypertension and coronary artery disease. Chadsvasc 4. Continue Coumadin which he is on long-term. If he has more frequent episodes in the future we will need to consider an antiarrhythmic.  2 hypertension-Blood pressure controlled. Continue present meds. Follow blood pressure and adjust regimen as needed.  3 coronary artery disease-continue statin. No aspirin given need for anticoagulation.  4 Hyperlipidemia-continue low-dose statin. He is intolerant to higher doses.  5 status post mitral valve repair-continue SBE prophylaxis.   6 carotid artery disease-mild on most recent study.  [name], MD

## 2016-06-12 ENCOUNTER — Encounter: Payer: Self-pay | Admitting: Cardiology

## 2016-06-12 ENCOUNTER — Ambulatory Visit (INDEPENDENT_AMBULATORY_CARE_PROVIDER_SITE_OTHER): Payer: Medicare Other | Admitting: Cardiology

## 2016-06-12 VITALS — BP 140/70 | HR 58 | Ht 69.0 in | Wt 179.8 lb

## 2016-06-12 DIAGNOSIS — I10 Essential (primary) hypertension: Secondary | ICD-10-CM | POA: Diagnosis not present

## 2016-06-12 DIAGNOSIS — I2510 Atherosclerotic heart disease of native coronary artery without angina pectoris: Secondary | ICD-10-CM | POA: Diagnosis not present

## 2016-06-12 DIAGNOSIS — I480 Paroxysmal atrial fibrillation: Secondary | ICD-10-CM | POA: Diagnosis not present

## 2016-06-12 DIAGNOSIS — E7800 Pure hypercholesterolemia, unspecified: Secondary | ICD-10-CM | POA: Diagnosis not present

## 2016-06-12 NOTE — Patient Instructions (Signed)
Your physician wants you to follow-up in: 6 MONTHS WITH DR CRENSHAW You will receive a reminder letter in the mail two months in advance. If you don't receive a letter, please call our office to schedule the follow-up appointment.   If you need a refill on your cardiac medications before your next appointment, please call your pharmacy.  

## 2016-06-22 ENCOUNTER — Other Ambulatory Visit: Payer: Self-pay | Admitting: Cardiology

## 2016-07-03 ENCOUNTER — Ambulatory Visit (INDEPENDENT_AMBULATORY_CARE_PROVIDER_SITE_OTHER): Payer: Medicare Other | Admitting: *Deleted

## 2016-07-03 DIAGNOSIS — Z5181 Encounter for therapeutic drug level monitoring: Secondary | ICD-10-CM

## 2016-07-03 DIAGNOSIS — Z7901 Long term (current) use of anticoagulants: Secondary | ICD-10-CM

## 2016-07-03 DIAGNOSIS — I2510 Atherosclerotic heart disease of native coronary artery without angina pectoris: Secondary | ICD-10-CM

## 2016-07-03 DIAGNOSIS — I82409 Acute embolism and thrombosis of unspecified deep veins of unspecified lower extremity: Secondary | ICD-10-CM | POA: Diagnosis not present

## 2016-07-03 DIAGNOSIS — I2699 Other pulmonary embolism without acute cor pulmonale: Secondary | ICD-10-CM

## 2016-07-03 LAB — POCT INR: INR: 2.4

## 2016-08-11 DIAGNOSIS — L814 Other melanin hyperpigmentation: Secondary | ICD-10-CM | POA: Diagnosis not present

## 2016-08-11 DIAGNOSIS — D1801 Hemangioma of skin and subcutaneous tissue: Secondary | ICD-10-CM | POA: Diagnosis not present

## 2016-08-11 DIAGNOSIS — D692 Other nonthrombocytopenic purpura: Secondary | ICD-10-CM | POA: Diagnosis not present

## 2016-08-11 DIAGNOSIS — D225 Melanocytic nevi of trunk: Secondary | ICD-10-CM | POA: Diagnosis not present

## 2016-08-11 DIAGNOSIS — L821 Other seborrheic keratosis: Secondary | ICD-10-CM | POA: Diagnosis not present

## 2016-08-14 ENCOUNTER — Ambulatory Visit (INDEPENDENT_AMBULATORY_CARE_PROVIDER_SITE_OTHER): Payer: Medicare Other | Admitting: *Deleted

## 2016-08-14 DIAGNOSIS — Z7901 Long term (current) use of anticoagulants: Secondary | ICD-10-CM | POA: Diagnosis not present

## 2016-08-14 DIAGNOSIS — I2699 Other pulmonary embolism without acute cor pulmonale: Secondary | ICD-10-CM | POA: Diagnosis not present

## 2016-08-14 DIAGNOSIS — Z5181 Encounter for therapeutic drug level monitoring: Secondary | ICD-10-CM

## 2016-08-14 DIAGNOSIS — I82409 Acute embolism and thrombosis of unspecified deep veins of unspecified lower extremity: Secondary | ICD-10-CM | POA: Diagnosis not present

## 2016-08-14 LAB — POCT INR: INR: 2.4

## 2016-09-08 ENCOUNTER — Encounter (INDEPENDENT_AMBULATORY_CARE_PROVIDER_SITE_OTHER): Payer: Self-pay

## 2016-09-08 ENCOUNTER — Encounter: Payer: Self-pay | Admitting: Pulmonary Disease

## 2016-09-08 ENCOUNTER — Ambulatory Visit (INDEPENDENT_AMBULATORY_CARE_PROVIDER_SITE_OTHER): Payer: Medicare Other | Admitting: Pulmonary Disease

## 2016-09-08 VITALS — BP 128/70 | HR 56 | Ht 69.0 in | Wt 181.5 lb

## 2016-09-08 DIAGNOSIS — M150 Primary generalized (osteo)arthritis: Secondary | ICD-10-CM

## 2016-09-08 DIAGNOSIS — K219 Gastro-esophageal reflux disease without esophagitis: Secondary | ICD-10-CM

## 2016-09-08 DIAGNOSIS — I359 Nonrheumatic aortic valve disorder, unspecified: Secondary | ICD-10-CM

## 2016-09-08 DIAGNOSIS — Z8719 Personal history of other diseases of the digestive system: Secondary | ICD-10-CM

## 2016-09-08 DIAGNOSIS — Z9889 Other specified postprocedural states: Secondary | ICD-10-CM

## 2016-09-08 DIAGNOSIS — R1310 Dysphagia, unspecified: Secondary | ICD-10-CM

## 2016-09-08 DIAGNOSIS — I10 Essential (primary) hypertension: Secondary | ICD-10-CM

## 2016-09-08 DIAGNOSIS — C61 Malignant neoplasm of prostate: Secondary | ICD-10-CM

## 2016-09-08 DIAGNOSIS — E7800 Pure hypercholesterolemia, unspecified: Secondary | ICD-10-CM

## 2016-09-08 DIAGNOSIS — Z86711 Personal history of pulmonary embolism: Secondary | ICD-10-CM

## 2016-09-08 DIAGNOSIS — M159 Polyosteoarthritis, unspecified: Secondary | ICD-10-CM

## 2016-09-08 DIAGNOSIS — E559 Vitamin D deficiency, unspecified: Secondary | ICD-10-CM

## 2016-09-08 DIAGNOSIS — I480 Paroxysmal atrial fibrillation: Secondary | ICD-10-CM

## 2016-09-08 DIAGNOSIS — Z86718 Personal history of other venous thrombosis and embolism: Secondary | ICD-10-CM

## 2016-09-08 NOTE — Patient Instructions (Addendum)
Today we updated your med list in our EPIC system...    Continue your current medications the same...  Please return to our lab one morning soon for your follow up FASTING blood work...    We will contact you w/ the results when available & arrange for appt w/ DrOttelin if needed...  We will arrange for a follow up appt w/ DrStark to address your intermittent swallowing difficulty & the colonoscopy due...  Stay as active as possible!  Call for any questions or if we can be of service in any way...  Let's plan a follow up visit in 15yr, sooner if needed for problems.Marland KitchenMarland Kitchen
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
## 2016-09-10 ENCOUNTER — Other Ambulatory Visit (INDEPENDENT_AMBULATORY_CARE_PROVIDER_SITE_OTHER): Payer: Medicare Other

## 2016-09-10 DIAGNOSIS — I480 Paroxysmal atrial fibrillation: Secondary | ICD-10-CM | POA: Diagnosis not present

## 2016-09-10 DIAGNOSIS — Z86711 Personal history of pulmonary embolism: Secondary | ICD-10-CM | POA: Diagnosis not present

## 2016-09-10 DIAGNOSIS — E7800 Pure hypercholesterolemia, unspecified: Secondary | ICD-10-CM

## 2016-09-10 DIAGNOSIS — C61 Malignant neoplasm of prostate: Secondary | ICD-10-CM | POA: Diagnosis not present

## 2016-09-10 DIAGNOSIS — I10 Essential (primary) hypertension: Secondary | ICD-10-CM

## 2016-09-10 DIAGNOSIS — E559 Vitamin D deficiency, unspecified: Secondary | ICD-10-CM

## 2016-09-10 LAB — CBC WITH DIFFERENTIAL/PLATELET
BASOS PCT: 0.7 % (ref 0.0–3.0)
Basophils Absolute: 0 10*3/uL (ref 0.0–0.1)
EOS ABS: 0.3 10*3/uL (ref 0.0–0.7)
EOS PCT: 6.8 % — AB (ref 0.0–5.0)
HEMATOCRIT: 41.6 % (ref 39.0–52.0)
HEMOGLOBIN: 13.9 g/dL (ref 13.0–17.0)
LYMPHS PCT: 35.7 % (ref 12.0–46.0)
Lymphs Abs: 1.8 10*3/uL (ref 0.7–4.0)
MCHC: 33.5 g/dL (ref 30.0–36.0)
MCV: 85.5 fl (ref 78.0–100.0)
MONO ABS: 0.5 10*3/uL (ref 0.1–1.0)
Monocytes Relative: 9.1 % (ref 3.0–12.0)
NEUTROS ABS: 2.4 10*3/uL (ref 1.4–7.7)
Neutrophils Relative %: 47.7 % (ref 43.0–77.0)
PLATELETS: 160 10*3/uL (ref 150.0–400.0)
RBC: 4.87 Mil/uL (ref 4.22–5.81)
RDW: 14.5 % (ref 11.5–15.5)
WBC: 5 10*3/uL (ref 4.0–10.5)

## 2016-09-10 LAB — LIPID PANEL
CHOL/HDL RATIO: 3
CHOLESTEROL: 180 mg/dL (ref 0–200)
HDL: 52 mg/dL (ref >39.00)
LDL CALC: 114 mg/dL — AB (ref 0–99)
NonHDL: 128.08
TRIGLYCERIDES: 72 mg/dL (ref 0.0–149.0)
VLDL: 14.4 mg/dL (ref 0.0–40.0)

## 2016-09-10 LAB — COMPREHENSIVE METABOLIC PANEL
ALBUMIN: 4.1 g/dL (ref 3.5–5.2)
ALT: 24 U/L (ref 0–53)
AST: 20 U/L (ref 0–37)
Alkaline Phosphatase: 95 U/L (ref 39–117)
BUN: 23 mg/dL (ref 6–23)
CALCIUM: 9.3 mg/dL (ref 8.4–10.5)
CHLORIDE: 109 meq/L (ref 96–112)
CO2: 28 meq/L (ref 19–32)
CREATININE: 1.15 mg/dL (ref 0.40–1.50)
GFR: 65.3 mL/min (ref >60.00)
Glucose, Bld: 101 mg/dL — ABNORMAL HIGH (ref 70–99)
Potassium: 5.3 mEq/L — ABNORMAL HIGH (ref 3.5–5.1)
Sodium: 142 mEq/L (ref 135–145)
Total Bilirubin: 0.6 mg/dL (ref 0.2–1.2)
Total Protein: 6.7 g/dL (ref 6.0–8.3)

## 2016-09-10 LAB — TSH: TSH: 5.01 u[IU]/mL — ABNORMAL HIGH (ref 0.35–4.50)

## 2016-09-10 LAB — PSA: PSA: 0.11 ng/mL (ref 0.10–4.00)

## 2016-09-10 LAB — VITAMIN D 25 HYDROXY (VIT D DEFICIENCY, FRACTURES): VITD: 12.82 ng/mL — ABNORMAL LOW (ref 30.00–100.00)

## 2016-09-14 ENCOUNTER — Other Ambulatory Visit: Payer: Self-pay | Admitting: Pulmonary Disease

## 2016-09-14 MED ORDER — VITAMIN D (ERGOCALCIFEROL) 1.25 MG (50000 UNIT) PO CAPS: 50000.0000 [IU] | ORAL_CAPSULE | ORAL | 4 refills | Status: AC

## 2016-09-17 ENCOUNTER — Telehealth: Payer: Self-pay

## 2016-09-17 ENCOUNTER — Encounter: Payer: Self-pay | Admitting: Physician Assistant

## 2016-09-17 ENCOUNTER — Ambulatory Visit (INDEPENDENT_AMBULATORY_CARE_PROVIDER_SITE_OTHER): Payer: Medicare Other | Admitting: Physician Assistant

## 2016-09-17 VITALS — BP 108/64 | HR 80 | Ht 69.0 in | Wt 180.0 lb

## 2016-09-17 DIAGNOSIS — Z9889 Other specified postprocedural states: Secondary | ICD-10-CM | POA: Diagnosis not present

## 2016-09-17 DIAGNOSIS — Z8719 Personal history of other diseases of the digestive system: Secondary | ICD-10-CM

## 2016-09-17 DIAGNOSIS — R1312 Dysphagia, oropharyngeal phase: Secondary | ICD-10-CM | POA: Diagnosis not present

## 2016-09-17 DIAGNOSIS — Z7901 Long term (current) use of anticoagulants: Secondary | ICD-10-CM | POA: Diagnosis not present

## 2016-09-17 DIAGNOSIS — Z86010 Personal history of colonic polyps: Secondary | ICD-10-CM

## 2016-09-17 MED ORDER — NA SULFATE-K SULFATE-MG SULF 17.5-3.13-1.6 GM/177ML PO SOLN: ORAL | 0 refills | Status: DC

## 2016-09-17 MED ORDER — RANITIDINE HCL 150 MG PO TABS: 150.0000 mg | ORAL_TABLET | Freq: Every day | ORAL | 11 refills | Status: DC

## 2016-09-17 MED ORDER — PANTOPRAZOLE SODIUM 40 MG PO TBEC: 40.0000 mg | DELAYED_RELEASE_TABLET | Freq: Every day | ORAL | 3 refills | Status: DC

## 2016-09-17 NOTE — Telephone Encounter (Signed)
[facility] [address] [place] [address]  RE: John [name] DOB: 1936/06/01 MRN: [id number]   Dear [name] MD   We have scheduled the above patient for an endoscopic procedure. Our records show that he is on anticoagulation therapy.   Please advise as to how long the patient may come off his therapy of coumadin prior to the EGD/colonoscopy procedure, which is scheduled for 11/17/16 with Dr Lucio Edward.  Please fax back/ or route the completed form to [name], [place] at [phone].   Sincerely,    Amy Esterwood, PA-C

## 2016-09-17 NOTE — Progress Notes (Signed)
Reviewed and agree with management plan.  Steel Kerney T. Jaelle Campanile, MD FACG 

## 2016-09-17 NOTE — Telephone Encounter (Signed)
Dc coumadin 5 days prior to procedure and resume day of [name]

## 2016-09-17 NOTE — Progress Notes (Signed)
Subjective:    [name], male    DOB: 07-Jun-1936, 81 y.o.   MRN: [id number]  HPI  [name] is a very nice 81 year old white male known to Dr. Fuller Plan who comes in today to discuss recall colonoscopy and also has complaints of dysphagia. He has history of hypertension, coronary artery disease status post CABG, previous mitral valve repair, atrial fibrillation, history of DVT, history of Zenker's diverticulum for which she underwent diverticulectomy and history of prostate cancer. He is maintained on chronic Coumadin for atrial fibrillation. Patient last had colonoscopy in November 2012 with finding of several millimeter polyps all of which were removed and internal hemorrhoids. Biopsies showed 3 of these polyps to be tubular adenomas and 1 was hyperplastic. Last EGD was done in October 2013 finding of an irregular Z line though biopsies showed no evidence of Barrett's or He has small hiatal hernia there was no evidence of stricture. Barium swallow in 2013 did show a moderate sized Zenker's diverticulum and he subsequently underwent diverticulectomy. Patient has no current complaints of changes in bowel habits, abdominal pain melena or hematochezia. He does state that he has ongoing heartburn despite taking Protonix 40 mg by mouth every morning he says he frequently will still have heartburn in the evenings after dinner and sometimes waking him from sleep. He has also noticed that over the past 6 months he has developed recurrent dysphagia. He says this is happening at least a couple of times a week. This is generally associated with solid food. He says he swallows and feels as if it doesn't go down, and will have to call for regurgitate food or liquid back up.  Review of Systems Pertinent positive and negative review of systems were noted in the above HPI section.  All other review of systems was otherwise negative.  Outpatient Encounter Prescriptions as of 09/17/2016  Medication Sig  .  amLODipine (NORVASC) 5 MG tablet Take 1 tablet (5 mg total) by mouth daily.  Marland Kitchen amoxicillin (AMOXIL) 500 MG capsule 2,000 mg. For dental procedures  . Coenzyme Q10 (CO Q-10) 100 MG CAPS Take 200 mg by mouth daily. (Patient taking differently: Take 200 mg by mouth at bedtime. )  . doxazosin (CARDURA) 8 MG tablet TAKE 1 TABLET BY MOUTH AT  BEDTIME  . ezetimibe (ZETIA) 10 MG tablet Take 1 tablet (10 mg total) by mouth daily. (Patient taking differently: Take 10 mg by mouth at bedtime. )  . losartan (COZAAR) 100 MG tablet Take 1 tablet by mouth  daily  . metoprolol (LOPRESSOR) 50 MG tablet Take 1.5 tablets (75 mg total) by mouth 2 (two) times daily.  . pravastatin (PRAVACHOL) 20 MG tablet Take 1 tablet by mouth  daily  . Vitamin D, Ergocalciferol, (DRISDOL) 50000 units CAPS capsule Take 1 capsule (50,000 Units total) by mouth every 7 (seven) days.  Marland Kitchen warfarin (COUMADIN) 2.5 MG tablet Take 1/2 to 1 tablet by mouth daily as directed by coumadin clinic  . Na Sulfate-K Sulfate-Mg Sulf (SUPREP BOWEL PREP KIT) 17.5-3.13-1.6 GM/180ML SOLN Use as directed  . pantoprazole (PROTONIX) 40 MG tablet Take 1 tablet (40 mg total) by mouth daily before breakfast.  . ranitidine (ZANTAC) 150 MG tablet Take 1 tablet (150 mg total) by mouth daily before supper.  . [DISCONTINUED] pantoprazole (PROTONIX) 40 MG tablet Take 1 tablet (40 mg total) by mouth daily. (Patient taking differently: Take 40 mg by mouth at bedtime. )   No facility-administered encounter medications on file as of  09/17/2016.    Allergies  Allergen Reactions  . Iodine Hives  . Iohexol Hives     PT ALLERGIC TO IV CONTRAST;(REACTION DURING CARDIAC CATHETERIZATION); NEEDS PREMEDS PER DR CRENSHAW!, Onset Date: [id number]   . Statins Other (See Comments)    Joint aches with Zocor, Lipitor, Crestor   Patient Active Problem List   Diagnosis Date Noted  . Shingles 09/09/2015  . History of DVT (deep vein thrombosis) 09/21/2014  . History of excision of  Zenker's diverticulum 10/04/2013  . Encounter for therapeutic drug monitoring 09/14/2013  . Zenker's diverticulum 09/12/2012  . Dysphagia 03/14/2012  . Bronchitis, acute 10/30/2011  . Colon polyps 09/07/2011  . CAD (coronary artery disease) 03/17/2011  . Panic anxiety syndrome 03/10/2011  . Long term (current) use of anticoagulants 08/31/2010  . Mitral valve disorder 12/10/2009  . PROSTATE CANCER 04/25/2009  . History of pulmonary embolism 04/25/2009  . DEEP VENOUS THROMBOPHLEBITIS 04/25/2009  . HEMORRHOIDS 04/25/2009  . GASTROESOPHAGEAL REFLUX DISEASE 04/25/2009  . SEBORRHEIC DERMATITIS 04/25/2009  . SEBACEOUS CYST, NECK 04/25/2009  . Osteoarthritis 04/25/2009  . Orthostatic hypotension 04/24/2009  . NECK PAIN 04/24/2009  . DIZZINESS 04/24/2009  . CHEST PAIN UNSPECIFIED 04/24/2009  . ESSENTIAL HYPERTENSION, BENIGN 01/10/2009  . Carotid artery disease (Alex) 01/10/2009  . MITRAL VALVE REPLACEMENT, HX OF 01/10/2009  . Hyperlipemia 10/25/2008  . CAD, ARTERY BYPASS GRAFT 10/25/2008  . Aortic valve disorder 10/25/2008  . ATRIAL FIBRILLATION 10/25/2008   Social History   Social History  . Marital status: Married    Spouse name: Buyer, retail  . Number of children: 3  . Years of education: N/A   Occupational History  . retired Retired   Social History Main Topics  . Smoking status: Former Smoker    Packs/day: 1.00    Years: 12.00    Types: Cigarettes    Quit date: 07/06/1968  . Smokeless tobacco: Never Used  . Alcohol use Yes     Comment: social use  . Drug use: No  . Sexual activity: Not on file   Other Topics Concern  . Not on file   Social History Narrative  . No narrative on file    Mr. Gervais's family history includes Heart attack in his father; Heart disease in his brother; Hypertension in his brother and mother; Prostate cancer in his brother; Stroke in his mother.      Objective:    Vitals:   09/17/16 0948  BP: 108/64  Pulse: 80    Physical Exam  well-developed elderly white male in no acute distress, blood pressure 108/64, pulse 80, height 5 foot 9, weight 180, BMI 26.5. HEENT ;nontraumatic normocephalic EOMI PERRLA sclera anicteric, Cardiovascular ;regular rate and rhythm with S1-S2 is a sternal incisional scar, Pulmonary ;clear bilaterally, Abdomen ;soft, nontender nondistended bowel sounds are active there is no palpable mass or hepatosplenomegaly, Rectal; exam not done, Ext ; no clubbing cyanosis or edema skin warm and dry, Neuropsych; mood and affect appropriate       Assessment & Plan:   #34 81 year old white male with history of adenomatous colon polyps, due for follow-up colonoscopy. #2 chronic anticoagulation with Coumadin #3 history of atrial fibrillation #4 history of DVT #5 coronary artery disease status post CABG #6 status post mitral valve repair #7 history of prostate cancer #8 chronic GERD #9 history of Zenker's diverticulum status post diverticulectomy   Plan; patient will be scheduled for colonoscopy with Dr. Fuller Plan. Procedure discussed in detail with patient including risks and benefits and he is  agreeable to proceed. Will also schedule for EGD with possible dilation. Again procedure discussed in detail including risks and benefits and he is agreeable to proceed. Patient will need to hold Coumadin for 5 days prior to procedures. We will communicate with his cardiologist Dr. Stanford Breed to a sure that this is reasonable for this patient.  Continue Protonix 40 mg by mouth every morning Add Zantac 150 mg by mouth every p.m.  before dinner.-Prescription sent   Amy Genia Harold PA-C 09/17/2016   Cc: Noralee Space, MD

## 2016-09-17 NOTE — Patient Instructions (Addendum)
Normal BMI (Body Mass Index- based on height and weight) is between 23 and 30. Your BMI today is Body mass index is 26.58 kg/m. Marland Kitchen Please consider follow up  regarding your BMI with your Primary Care Provider.   Continue Protonix 40mg  daily 30 minutes before breakfast.  Refill sent to your mail order pharmacy.   We have sent the following medications to your pharmacy for you to pick up at your convenience: Zantac to take 30 minutes before supper.   You have been scheduled for an endoscopy and colonoscopy. Please follow the written instructions given to you at your visit today. Please pick up your prep supplies at the pharmacy within the next 1-3 days. If you use inhalers (even only as needed), please bring them with you on the day of your procedure. Your physician has requested that you go to www.startemmi.com and enter the access code given to you at your visit today. This web site gives a general overview about your procedure. However, you should still follow specific instructions given to you by our office regarding your preparation for the procedure.   You will be contaced by our office prior to your procedure for directions on holding your Coumadin/Warfarin.  If you do not hear from our office 1 week prior to your scheduled procedure, please call [phone] to discuss.   I appreciate the opportunity to care for you.

## 2016-09-18 NOTE — Telephone Encounter (Signed)
Patient informed and verbalized understanding of holding his coumadin.

## 2016-09-19 ENCOUNTER — Emergency Department (HOSPITAL_COMMUNITY): Payer: Medicare Other

## 2016-09-19 ENCOUNTER — Emergency Department (HOSPITAL_COMMUNITY)
Admission: EM | Admit: 2016-09-19 | Discharge: 2016-09-19 | Disposition: A | Discharge status: Home / Self Care | Payer: Medicare Other | Attending: Emergency Medicine | Admitting: Emergency Medicine

## 2016-09-19 ENCOUNTER — Encounter (HOSPITAL_COMMUNITY): Payer: Self-pay | Admitting: Emergency Medicine

## 2016-09-19 DIAGNOSIS — Z87891 Personal history of nicotine dependence: Secondary | ICD-10-CM | POA: Diagnosis not present

## 2016-09-19 DIAGNOSIS — I2510 Atherosclerotic heart disease of native coronary artery without angina pectoris: Secondary | ICD-10-CM | POA: Insufficient documentation

## 2016-09-19 DIAGNOSIS — Z8546 Personal history of malignant neoplasm of prostate: Secondary | ICD-10-CM | POA: Insufficient documentation

## 2016-09-19 DIAGNOSIS — Z7901 Long term (current) use of anticoagulants: Secondary | ICD-10-CM | POA: Diagnosis not present

## 2016-09-19 DIAGNOSIS — Z951 Presence of aortocoronary bypass graft: Secondary | ICD-10-CM | POA: Insufficient documentation

## 2016-09-19 DIAGNOSIS — R079 Chest pain, unspecified: Secondary | ICD-10-CM | POA: Diagnosis not present

## 2016-09-19 DIAGNOSIS — I4891 Unspecified atrial fibrillation: Secondary | ICD-10-CM | POA: Insufficient documentation

## 2016-09-19 DIAGNOSIS — Z79899 Other long term (current) drug therapy: Secondary | ICD-10-CM | POA: Insufficient documentation

## 2016-09-19 DIAGNOSIS — R000 Tachycardia, unspecified: Secondary | ICD-10-CM | POA: Diagnosis not present

## 2016-09-19 LAB — I-STAT TROPONIN, ED: Troponin i, poc: 0 ng/mL (ref 0.00–0.08)

## 2016-09-19 LAB — BASIC METABOLIC PANEL
ANION GAP: 8 (ref 5–15)
BUN: 25 mg/dL — ABNORMAL HIGH (ref 6–20)
CHLORIDE: 110 mmol/L (ref 101–111)
CO2: 21 mmol/L — ABNORMAL LOW (ref 22–32)
Calcium: 8.8 mg/dL — ABNORMAL LOW (ref 8.9–10.3)
Creatinine, Ser: 1.07 mg/dL (ref 0.61–1.24)
GFR calc Af Amer: >60 mL/min (ref >60)
GLUCOSE: 128 mg/dL — AB (ref 65–99)
POTASSIUM: 4.2 mmol/L (ref 3.5–5.1)
Sodium: 139 mmol/L (ref 135–145)

## 2016-09-19 LAB — CBC
HEMATOCRIT: 39.6 % (ref 39.0–52.0)
Hemoglobin: 13.2 g/dL (ref 13.0–17.0)
MCH: 28.6 pg (ref 26.0–34.0)
MCHC: 33.3 g/dL (ref 30.0–36.0)
MCV: 85.9 fL (ref 78.0–100.0)
PLATELETS: 149 10*3/uL — AB (ref 150–400)
RBC: 4.61 MIL/uL (ref 4.22–5.81)
RDW: 14.3 % (ref 11.5–15.5)
WBC: 5.3 10*3/uL (ref 4.0–10.5)

## 2016-09-19 LAB — PROTIME-INR
INR: 2.22
PROTHROMBIN TIME: 25 s — AB (ref 11.4–15.2)

## 2016-09-19 MED ORDER — METOPROLOL TARTRATE 5 MG/5ML IV SOLN
10.0000 mg | Freq: Once | INTRAVENOUS | Status: AC
  Administered 2016-09-19: 10 mg via INTRAVENOUS
  Filled 2016-09-19: qty 10

## 2016-09-19 MED ORDER — PROPOFOL 10 MG/ML IV BOLUS
40.0000 mg | Freq: Once | INTRAVENOUS | Status: DC
  Filled 2016-09-19: qty 20

## 2016-09-19 MED ORDER — PROPOFOL 10 MG/ML IV BOLUS
INTRAVENOUS | Status: AC | PRN
  Administered 2016-09-19: 40 mg via INTRAVENOUS

## 2016-09-19 NOTE — ED Triage Notes (Signed)
Pt states for the last 2 days he has been feeling weak and this monring started having some tightness in his chest and took his pulse and noticed it was "fast". Pt has a history of Afib and hr is 116 in afib in triage. Pt is warm and dry.

## 2016-09-19 NOTE — Discharge Instructions (Signed)
Call your cardiologist on Monday.  Continue taking your coumadin.

## 2016-09-19 NOTE — ED Provider Notes (Signed)
[facility] DEPT Provider Note   CSN: [id number] Arrival date & time: 09/19/16  1535     History   Chief Complaint Chief Complaint  Patient presents with  . Atrial Fibrillation    HPI John [name] is a 81 y.o. male.  81 yo M with a chief complaints of weakness and palpitations. The started yesterday afternoon while he was gardening. Patient checked his heart rate on his blood pressure cuff this morning and realized he was tachycardic. He did have a doctor's appointment yesterday as well had a normal heart rate. Patient had not had any symptoms similar to this over the past. He was cardioverted recently in the emergency department. He is chronically on Coumadin for a prior PE. He denies any other symptomatology. Denies ongoing chest pain on exertion denies lower extremity edema denies cough congestion fevers denies vomiting or diarrhea.   The history is provided by the patient.  Illness  This is a new problem. The current episode started yesterday. The problem occurs constantly. The problem has not changed since onset.Associated symptoms include chest pain. Pertinent negatives include no abdominal pain, no headaches and no shortness of breath. Nothing aggravates the symptoms. Nothing relieves the symptoms. He has tried nothing for the symptoms. The treatment provided no relief.    Past Medical History:  Diagnosis Date  . Anxiety   . CAD (coronary artery disease)    s/p CABG  . Carotid stenosis   . Complication of anesthesia    zenkers  diverticulum  . Deep vein thrombophlebitis of leg (HCC)   . DJD (degenerative joint disease)   . Essential hypertension, benign   . GERD (gastroesophageal reflux disease)   . Hemorrhoids   . Hiatal hernia   . Hyperlipidemia   . PE (pulmonary embolism)   . Prostate cancer ([place])   . Sebaceous cyst    neck  . Seborrheic dermatitis   . Tubular adenoma of colon 05/2011    Patient Active Problem List   Diagnosis Date Noted  . Shingles  09/09/2015  . History of DVT (deep vein thrombosis) 09/21/2014  . History of excision of Zenker's diverticulum 10/04/2013  . Encounter for therapeutic drug monitoring 09/14/2013  . Zenker's diverticulum 09/12/2012  . Dysphagia 03/14/2012  . Bronchitis, acute 10/30/2011  . Colon polyps 09/07/2011  . CAD (coronary artery disease) 03/17/2011  . Panic anxiety syndrome 03/10/2011  . Long term (current) use of anticoagulants 08/31/2010  . Mitral valve disorder 12/10/2009  . PROSTATE CANCER 04/25/2009  . History of pulmonary embolism 04/25/2009  . DEEP VENOUS THROMBOPHLEBITIS 04/25/2009  . HEMORRHOIDS 04/25/2009  . GASTROESOPHAGEAL REFLUX DISEASE 04/25/2009  . SEBORRHEIC DERMATITIS 04/25/2009  . SEBACEOUS CYST, NECK 04/25/2009  . Osteoarthritis 04/25/2009  . Orthostatic hypotension 04/24/2009  . NECK PAIN 04/24/2009  . DIZZINESS 04/24/2009  . CHEST PAIN UNSPECIFIED 04/24/2009  . ESSENTIAL HYPERTENSION, BENIGN 01/10/2009  . Carotid artery disease ([place]) 01/10/2009  . MITRAL VALVE REPLACEMENT, HX OF 01/10/2009  . Hyperlipemia 10/25/2008  . CAD, ARTERY BYPASS GRAFT 10/25/2008  . Aortic valve disorder 10/25/2008  . ATRIAL FIBRILLATION 10/25/2008    Past Surgical History:  Procedure Laterality Date  . bilateral shoulder surgery    . CORONARY ARTERY BYPASS GRAFT     x2    2009  . I&D EXTREMITY  07/09/2011   Procedure: IRRIGATION AND DEBRIDEMENT EXTREMITY;  Surgeon: Tennis Must;  Location: [facility];  Service: Orthopedics;  Laterality: Left;  . left knee arthroscopy    .  MITRAL VALVE REPAIR    . prostate cancer surgery  11/1996   radial retropubic prostatectomy  . TANDEM AND OVOID INSERTION    . VASECTOMY    . ZENKER'S DIVERTICULECTOMY N/A 11/07/2012   Procedure: ZENKER'S DIVERTICULECTOMY ENDOSCOPIC;  Surgeon: Melida Quitter, MD;  Location: [facility];  Service: ENT;  Laterality: N/A;       Home Medications    Prior to Admission medications   Medication Sig Start Date  End Date Taking? Authorizing Provider  amLODipine (NORVASC) 5 MG tablet Take 1 tablet (5 mg total) by mouth daily. 03/19/16   Lelon Perla, MD  amoxicillin (AMOXIL) 500 MG capsule 2,000 mg. For dental procedures 02/27/16   Historical Provider, MD  Coenzyme Q10 (CO Q-10) 100 MG CAPS Take 200 mg by mouth daily. Patient taking differently: Take 200 mg by mouth at bedtime.  10/10/13   Noralee Space, MD  doxazosin (CARDURA) 8 MG tablet TAKE 1 TABLET BY MOUTH AT  BEDTIME 04/23/16   Lelon Perla, MD  ezetimibe (ZETIA) 10 MG tablet Take 1 tablet (10 mg total) by mouth daily. Patient taking differently: Take 10 mg by mouth at bedtime.  10/10/13   Noralee Space, MD  losartan (COZAAR) 100 MG tablet Take 1 tablet by mouth  daily 03/02/16   Lelon Perla, MD  metoprolol (LOPRESSOR) 50 MG tablet Take 1.5 tablets (75 mg total) by mouth 2 (two) times daily. 03/02/16   Lelon Perla, MD  Na Sulfate-K Sulfate-Mg Sulf (SUPREP BOWEL PREP KIT) 17.5-3.13-1.6 GM/180ML SOLN Use as directed 09/17/16   Amy S Esterwood, PA-C  pantoprazole (PROTONIX) 40 MG tablet Take 1 tablet (40 mg total) by mouth daily before breakfast. 09/17/16 08/23/19  Amy S Esterwood, PA-C  pravastatin (PRAVACHOL) 20 MG tablet Take 1 tablet by mouth  daily 03/02/16   Lelon Perla, MD  ranitidine (ZANTAC) 150 MG tablet Take 1 tablet (150 mg total) by mouth daily before supper. 09/17/16   Amy S Esterwood, PA-C  Vitamin D, Ergocalciferol, (DRISDOL) 50000 units CAPS capsule Take 1 capsule (50,000 Units total) by mouth every 7 (seven) days. 09/14/16   Noralee Space, MD  warfarin (COUMADIN) 2.5 MG tablet Take 1/2 to 1 tablet by mouth daily as directed by coumadin clinic 06/22/16   Lelon Perla, MD    Family History Family History  Problem Relation Age of Onset  . Heart attack Father   . Stroke Mother   . Hypertension Mother   . Hypertension Brother   . Heart disease Brother   . Prostate cancer Brother     Social History Social History    Substance Use Topics  . Smoking status: Former Smoker    Packs/day: 1.00    Years: 12.00    Types: Cigarettes    Quit date: 07/06/1968  . Smokeless tobacco: Never Used  . Alcohol use Yes     Comment: social use     Allergies   Iodine; Iohexol; and Statins   Review of Systems Review of Systems  Constitutional: Negative for chills and fever.  HENT: Negative for congestion and facial swelling.   Eyes: Negative for discharge and visual disturbance.  Respiratory: Negative for shortness of breath.   Cardiovascular: Positive for chest pain and palpitations.  Gastrointestinal: Negative for abdominal pain, diarrhea and vomiting.  Musculoskeletal: Negative for arthralgias and myalgias.  Skin: Negative for color change and rash.  Neurological: Positive for weakness. Negative for tremors, syncope and headaches.  Psychiatric/Behavioral: Negative for  confusion and dysphoric mood.     Physical Exam Updated Vital Signs BP 125/68 (BP Location: Right Arm)   Pulse (!) 54   Temp 97.7 F (36.5 C) (Oral)   Resp 14   SpO2 93%   Physical Exam  Constitutional: He is oriented to person, place, and time. He appears well-developed and well-nourished.  HENT:  Head: Normocephalic and atraumatic.  Eyes: EOM are normal. Pupils are equal, round, and reactive to light.  Neck: Normal range of motion. Neck supple. No JVD present.  Cardiovascular: An irregularly irregular rhythm present. Tachycardia present.  Exam reveals no gallop and no friction rub.   No murmur heard. Pulmonary/Chest: No respiratory distress. He has no wheezes.  Abdominal: He exhibits no distension. There is no rebound and no guarding.  Musculoskeletal: Normal range of motion.  Neurological: He is alert and oriented to person, place, and time.  Skin: No rash noted. No pallor.  Psychiatric: He has a normal mood and affect. His behavior is normal.  Nursing note and vitals reviewed.    ED Treatments / Results  Labs (all labs  ordered are listed, but only abnormal results are displayed) Labs Reviewed  BASIC METABOLIC PANEL - Abnormal; Notable for the following:       Result Value   CO2 21 (*)    Glucose, Bld 128 (*)    BUN 25 (*)    Calcium 8.8 (*)    All other components within normal limits  CBC - Abnormal; Notable for the following:    Platelets 149 (*)    All other components within normal limits  PROTIME-INR - Abnormal; Notable for the following:    Prothrombin Time 25.0 (*)    All other components within normal limits  I-STAT TROPOININ, ED    EKG  EKG Interpretation  Date/Time:  Saturday September 19 2016 15:42:03 EDT Ventricular Rate:  116 PR Interval:    QRS Duration: 92 QT Interval:  352 QTC Calculation: 489 R Axis:   78 Text Interpretation:  Atrial fibrillation with rapid ventricular response Abnormal ECG Otherwise no significant change Confirmed by Tyrone Nine MD, DANIEL [phone]) on 09/19/2016 4:32:22 PM       Radiology Dg Chest 2 View  Result Date: 09/19/2016 CLINICAL DATA:  Chest tightness. EXAM: CHEST  2 VIEW COMPARISON:  03/01/2016 FINDINGS: Postsurgical changes from CABG and mitral valve repair are stable. Cardiomediastinal silhouette is normal. Mediastinal contours appear intact. There is no evidence of focal airspace consolidation, pleural effusion or pneumothorax. Osseous structures are without acute abnormality. Exaggerated thoracic kyphosis. Soft tissues are grossly normal. IMPRESSION: No active cardiopulmonary disease. Electronically Signed   By: Fidela Salisbury M.D.   On: 09/19/2016 16:25    Procedures .Cardioversion Date/Time: 09/19/2016 6:43 PM Performed by: Deno Etienne Authorized by: Deno Etienne   Consent:    Consent obtained:  Written   Consent given by:  Patient   Risks discussed:  Cutaneous burn, death, induced arrhythmia and pain   Alternatives discussed:  Rate-control medication Pre-procedure details:    Cardioversion basis:  Emergent   Rhythm:  Atrial fibrillation    Electrode placement:  Anterior-posterior Attempt one:    Cardioversion mode:  Synchronous   Waveform:  Biphasic   Shock (Joules):  200   Shock outcome:  Conversion to normal sinus rhythm Post-procedure details:    Patient status:  Awake   Patient tolerance of procedure:  Tolerated well, no immediate complications .Sedation Date/Time: 09/19/2016 6:44 PM Performed by: Deno Etienne Authorized by: Deno Etienne  Consent:    Consent obtained:  Written   Consent given by:  Patient   Risks discussed:  Allergic reaction, inadequate sedation, vomiting, prolonged sedation necessitating reversal, prolonged hypoxia resulting in organ damage and respiratory compromise necessitating ventilatory assistance and intubation   Alternatives discussed:  Analgesia without sedation Indications:    Procedure performed:  Cardioversion   Procedure necessitating sedation performed by:  Physician performing sedation   Intended level of sedation:  Moderate (conscious sedation) Pre-sedation assessment:    ASA classification: class 2 - patient with mild systemic disease     Neck mobility: normal     Mouth opening:  3 or more finger widths   Thyromental distance:  3 finger widths   Mallampati score:  I - soft palate, uvula, fauces, pillars visible   Pre-sedation assessments completed and reviewed: airway patency, cardiovascular function, hydration status, mental status, nausea/vomiting, pain level, respiratory function and temperature     History of difficult intubation: no   Immediate pre-procedure details:    Reassessment: Patient reassessed immediately prior to procedure     Reviewed: vital signs, relevant labs/tests and NPO status     Verified: bag valve mask available, emergency equipment available, intubation equipment available and oxygen available   Procedure details (see MAR for exact dosages):    Preoxygenation:  Nasal cannula   Sedation:  Propofol   Intra-procedure monitoring:  Blood pressure monitoring,  cardiac monitor, continuous capnometry, continuous pulse oximetry, frequent LOC assessments and frequent vital sign checks   Intra-procedure events: none   Post-procedure details:    Post-sedation assessments completed and reviewed: airway patency, cardiovascular function, hydration status, mental status, nausea/vomiting, pain level, respiratory function and temperature     Patient is stable for discharge or admission: yes     Patient tolerance:  Tolerated well, no immediate complications   (including critical care time)  Medications Ordered in ED Medications  propofol (DIPRIVAN) 10 mg/mL bolus/IV push 40 mg ( Intravenous Canceled Entry 09/19/16 1816)  metoprolol (LOPRESSOR) injection 10 mg (10 mg Intravenous Given 09/19/16 1735)  propofol (DIPRIVAN) 10 mg/mL bolus/IV push (40 mg Intravenous Given 09/19/16 1805)     Initial Impression / Assessment and Plan / ED Course  I have reviewed the triage vital signs and the nursing notes.  Pertinent labs & imaging results that were available during my care of the patient were reviewed by me and considered in my medical decision making (see chart for details).     81 yo M With acute onset of palpitations and weakness. This occurred yesterday afternoon. Patient checked his heart rate on his blood pressure cuff this morning and realized that he was tachycardic. He had a doctor's appointment yesterday where he had a normal heart rate. Patient states that he can feel in this acutely onset and he is also on Coumadin. I discussed risks and benefits of cardioversion with the patient to is electing for cardioversion at this time.  Chadsvasc 2.  Already on coumadin for prior PE.   Patient was cardioverted with no significant difficulty. Will have him follow-up with the [facility] fib clinic.  6:46 PM:  I have discussed the diagnosis/risks/treatment options with the patient and family and believe the pt to be eligible for discharge home to follow-up with Cards. We  also discussed returning to the ED immediately if new or worsening sx occur. We discussed the sx which are most concerning (e.g., sudden worsening chest pain, sob, palpitations) that necessitate immediate return. Medications administered to the patient during their  visit and any new prescriptions provided to the patient are listed below.  Medications given during this visit Medications  propofol (DIPRIVAN) 10 mg/mL bolus/IV push 40 mg ( Intravenous Canceled Entry 09/19/16 1816)  metoprolol (LOPRESSOR) injection 10 mg (10 mg Intravenous Given 09/19/16 1735)  propofol (DIPRIVAN) 10 mg/mL bolus/IV push (40 mg Intravenous Given 09/19/16 1805)     The patient appears reasonably screen and/or stabilized for discharge and I doubt any other medical condition or other [facility] requiring further screening, evaluation, or treatment in the ED at this time prior to discharge.     Final Clinical Impressions(s) / ED Diagnoses   Final diagnoses:  Atrial fibrillation with [facility])    New Prescriptions New Prescriptions   No medications on file     Deno Etienne, DO 09/19/16 1846

## 2016-09-21 ENCOUNTER — Telehealth: Payer: Self-pay | Admitting: Cardiology

## 2016-09-21 NOTE — Telephone Encounter (Signed)
Pt went to the ER on Saturday  With Atrial Fib.He was told to follow up with Dr Stanford Breed. First available appointments with anybody is next Wednesday. Please call pt to evaluate to see if this is alright.

## 2016-09-21 NOTE — Telephone Encounter (Signed)
PT HAS APPT  WITH  [facility]  ON  3-28 -18  AND  THIS  IS  OKAY ENCOURAGED  TO KEEP APPT  ALSO  AFIB  CLINIC  WS  SUGGESTED  TO   PT .PT WILL DISCUSS  WITH  HAO   AT APPT .Adonis Housekeeper

## 2016-09-23 ENCOUNTER — Ambulatory Visit (INDEPENDENT_AMBULATORY_CARE_PROVIDER_SITE_OTHER): Payer: Medicare Other | Admitting: Physician Assistant

## 2016-09-23 ENCOUNTER — Encounter: Payer: Self-pay | Admitting: Physician Assistant

## 2016-09-23 VITALS — BP 154/71 | HR 55 | Ht 69.0 in | Wt 185.6 lb

## 2016-09-23 DIAGNOSIS — E785 Hyperlipidemia, unspecified: Secondary | ICD-10-CM

## 2016-09-23 DIAGNOSIS — Z9889 Other specified postprocedural states: Secondary | ICD-10-CM

## 2016-09-23 DIAGNOSIS — I25810 Atherosclerosis of coronary artery bypass graft(s) without angina pectoris: Secondary | ICD-10-CM | POA: Diagnosis not present

## 2016-09-23 DIAGNOSIS — I480 Paroxysmal atrial fibrillation: Secondary | ICD-10-CM

## 2016-09-23 DIAGNOSIS — I6529 Occlusion and stenosis of unspecified carotid artery: Secondary | ICD-10-CM

## 2016-09-23 NOTE — Progress Notes (Signed)
Cardiology Office Note    Date:  09/23/2016   ID:  [name], DOB 20-Mar-1936, MRN [id number]  PCP:  Noralee Space, MD  Cardiologist:  Dr. Stanford Breed  Chief Complaint  Patient presents with  . Follow-up    seen for Dr. Stanford Breed    History of Present Illness:  John [name] is a 81 y.o. male with PMH of carotid artery stenosis, GERD, hyperlipidemia, h/o PE, prostate cancer, atrial fibrillation on coumadin, mitral valve repair, and CAD s/p CABG in January 2009. He had a Myoview performed on 07/12/2008 which is normal with ejection fraction of 66%. Last carotid Doppler performed in October 2015 showed minimal plaque and a follow-up was needed. Echocardiogram obtained in September 2017 shows normal LV function, s/p MV repair was mild MS and mild MR. He required DCCV in the past for atrial fibrillation.  His last appointment with Dr. Stanford Breed was on 06/12/2016. He has been followed closely at the Coumadin clinic, his INR has always been therapeutic for the past 4 months. He was previously seen in the emergency room on 03/01/2016 for atrial fibrillation, he underwent DC cardioversion in the emergency room. He returned to the ED on 09/19/2016, he underwent another cardioversion. He was instructed to follow-up with cardiology as outpatient.  He presents today for follow-up, so far he has had 2 episodes of atrial fibrillation in the last year. His INR has always been very well controlled. Unfortunately when he is in the office, his heart rate has been in the 50s while in sinus rhythm. This is making the up titration of the beta blocker quite difficult. I discussed various options with the patient including potentially increased beta blocker up to 100 mg twice a day and see how he responds, versus continue on current rate control medication with additional 50 mg metoprolol as needed if having recurrent palpitation, versus initiation of amiodarone therapy. He had brief amiodarone therapy after his previous  bypass surgery, however this was later discontinued. Eventually we decided to continue on the current medication with additional 50 mg metoprolol on as-needed basis for any recurrence. If he has another episode, then I would recommend initiation of amiodarone therapy for control purpose as it appears he is quite symptomatic during the episode. I also advised the patient to contact cardiology service before seeking attention in the emergency room. If he his heart rate is in the 110 to 120s with stable blood pressure, I think we often able to manage on the outpatient basis.    Past Medical History:  Diagnosis Date  . Anxiety   . CAD (coronary artery disease)    s/p CABG  . Carotid stenosis   . Complication of anesthesia    zenkers  diverticulum  . Deep vein thrombophlebitis of leg (HCC)   . DJD (degenerative joint disease)   . Essential hypertension, benign   . GERD (gastroesophageal reflux disease)   . Hemorrhoids   . Hiatal hernia   . Hyperlipidemia   . PE (pulmonary embolism)   . Prostate cancer ([place])   . Sebaceous cyst    neck  . Seborrheic dermatitis   . Tubular adenoma of colon 05/2011    Past Surgical History:  Procedure Laterality Date  . bilateral shoulder surgery    . CORONARY ARTERY BYPASS GRAFT     x2    2009  . I&D EXTREMITY  07/09/2011   Procedure: IRRIGATION AND DEBRIDEMENT EXTREMITY;  Surgeon: Tennis Must;  Location: [facility];  Service: Orthopedics;  Laterality: Left;  . left knee arthroscopy    . MITRAL VALVE REPAIR    . prostate cancer surgery  11/1996   radial retropubic prostatectomy  . TANDEM AND OVOID INSERTION    . VASECTOMY    . ZENKER'S DIVERTICULECTOMY N/A 11/07/2012   Procedure: ZENKER'S DIVERTICULECTOMY ENDOSCOPIC;  Surgeon: Melida Quitter, MD;  [facility];  Service: ENT;  Laterality: N/A;    Current Medications: Outpatient Medications Prior to Visit  Medication Sig Dispense Refill  . amLODipine (NORVASC) 5 MG tablet Take 1  tablet (5 mg total) by mouth daily. 90 tablet 3  . amoxicillin (AMOXIL) 500 MG capsule 2,000 mg. For dental procedures    . Coenzyme Q10 (CO Q-10) 100 MG CAPS Take 200 mg by mouth daily. (Patient taking differently: Take 200 mg by mouth at bedtime. )  0  . doxazosin (CARDURA) 8 MG tablet TAKE 1 TABLET BY MOUTH AT  BEDTIME 90 tablet 0  . ezetimibe (ZETIA) 10 MG tablet Take 1 tablet (10 mg total) by mouth daily. (Patient taking differently: Take 10 mg by mouth at bedtime. ) 90 tablet 3  . losartan (COZAAR) 100 MG tablet Take 1 tablet by mouth  daily 90 tablet 4  . metoprolol (LOPRESSOR) 50 MG tablet Take 1.5 tablets (75 mg total) by mouth 2 (two) times daily. 270 tablet 3  . Na Sulfate-K Sulfate-Mg Sulf (SUPREP BOWEL PREP KIT) 17.5-3.13-1.6 GM/180ML SOLN Use as directed 2 Bottle 0  . pantoprazole (PROTONIX) 40 MG tablet Take 1 tablet (40 mg total) by mouth daily before breakfast. 90 tablet 3  . pravastatin (PRAVACHOL) 20 MG tablet Take 1 tablet by mouth  daily 90 tablet 4  . ranitidine (ZANTAC) 150 MG tablet Take 1 tablet (150 mg total) by mouth daily before supper. 30 tablet 11  . Vitamin D, Ergocalciferol, (DRISDOL) 50000 units CAPS capsule Take 1 capsule (50,000 Units total) by mouth every 7 (seven) days. 12 capsule 4  . warfarin (COUMADIN) 2.5 MG tablet Take 1/2 to 1 tablet by mouth daily as directed by coumadin clinic 90 tablet 0   No facility-administered medications prior to visit.      Allergies:   Iodine; Iohexol; and Statins   Social History   Social History  . Marital status: Married    Spouse name: Buyer, retail  . Number of children: 3  . Years of education: N/A   Occupational History  . retired Retired   Social History Main Topics  . Smoking status: Former Smoker    Packs/day: 1.00    Years: 12.00    Types: Cigarettes    Quit date: 07/06/1968  . Smokeless tobacco: Never Used  . Alcohol use Yes     Comment: social use  . Drug use: No  . Sexual activity: Not Asked   Other  Topics Concern  . None   Social History Narrative  . None     Family History:  The patient's family history includes Heart attack in his father; Heart disease in his brother; Hypertension in his brother and mother; Prostate cancer in his brother; Stroke in his mother.   ROS:   Please see the history of present illness.    ROS All other systems reviewed and are negative.   PHYSICAL EXAM:   VS:  BP (!) 154/71   Pulse (!) 55   Ht 5' 9"  (1.753 m)   Wt 185 lb 9.6 oz (84.2 kg)   BMI 27.41 kg/m    GEN:  Well nourished, well developed, in no acute distress  HEENT: normal  Neck: no JVD, carotid bruits, or masses Cardiac: RRR; no murmurs, rubs, or gallops,no edema  Respiratory:  clear to auscultation bilaterally, normal work of breathing GI: soft, nontender, nondistended, + BS MS: no deformity or atrophy  Skin: warm and dry, no rash Neuro:  Alert and Oriented x 3, Strength and sensation are intact Psych: euthymic mood, full affect  Wt Readings from Last 3 Encounters:  09/23/16 185 lb 9.6 oz (84.2 kg)  09/17/16 180 lb (81.6 kg)  09/08/16 181 lb 8 oz (82.3 kg)      Studies/Labs Reviewed:   EKG:  EKG is not ordered today.   Recent Labs: 03/01/2016: Magnesium 2.3 09/10/2016: ALT 24; TSH 5.01 09/19/2016: BUN 25; Creatinine, Ser 1.07; Hemoglobin 13.2; Platelets 149; Potassium 4.2; Sodium 139   Lipid Panel    Component Value Date/Time   CHOL 180 09/10/2016 0938   TRIG 72.0 09/10/2016 0938   HDL 52.00 09/10/2016 0938   CHOLHDL 3 09/10/2016 0938   VLDL 14.4 09/10/2016 0938   LDLCALC 114 (H) 09/10/2016 0938   LDLDIRECT 155.5 03/11/2011 0828    Additional studies/ records that were reviewed today include:   Echo 03/19/2016 LV EF: 55% -   60%  Study Conclusions  - Left ventricle: The cavity size was normal. Wall thickness was   increased in a pattern of mild LVH. Systolic function was normal.   The estimated ejection fraction was in the range of 55% to 60%.   Indeterminant  diastolic function (s/p mitral valve repair). Wall   motion was normal; there were no regional wall motion   abnormalities. - Aortic valve: There was no stenosis. There was trivial   regurgitation. - Mitral valve: Status post mitral valve repair. The findings are   consistent with mild stenosis. There was mild regurgitation.   Pressure half-time: 142 ms. Mean gradient (D): 6 mm Hg. Valve   area by pressure half-time: 1.55 cm^2. - Left atrium: The atrium was moderately dilated. - Right ventricle: The cavity size was mildly to moderately   dilated. Systolic function was normal. - Right atrium: The atrium was mildly dilated. - Tricuspid valve: Peak RV-RA gradient (S): 45 mm Hg. - Pulmonary arteries: PA peak pressure: 48 mm Hg (S). - Inferior vena cava: The vessel was normal in size. The   respirophasic diameter changes were in the normal range (= 50%),   consistent with normal central venous pressure.  Impressions:  - Normal LV size with mild LV hypertrophy. EF 55-60%. Mild to   moderately dilated RV with normal systolic function. Status post   mitral valve repair with mild regurgitation and mild stenosis.   Mild pulmonary hypertension.   ASSESSMENT:    1. PAF (paroxysmal atrial fibrillation) (HCC)   2. Stenosis of carotid artery, unspecified laterality   3. Hyperlipidemia, unspecified hyperlipidemia type   4. H/O mitral valve repair   5. Coronary artery disease involving coronary bypass graft of native heart without angina pectoris      PLAN:  In order of problems listed above:  1. PAF on Coumadin: He has had 2 episode of atrial fibrillation in the last year, given the infrequent reccurence, we have discussed various options. Eventually we decided to continue on current medication for rate control and take additional 50 mg metoprolol on a as needed basis if has recurrence. I am unable to up titrate the beta blocker due to baseline bradycardia. If this atrial fibrillation does  recur, I would recommend initiation of amiodarone therapy. Although amiodarone can potentially decrease his heart rate as well, however I think he would be able to tolerate a low-dose amiodarone just fine. He is not a candidate for class Ic antiarrhythmic drug due to h/o CAD and CABG  2. CAD s/p CABG: No obvious angina  3. History of mitral valve repair: Last echo on 03/19/2016 demonstrated mild mitral stenosis, mild MR, stable mitral valve repair  4. Hyperlipidemia: On Pravachol  5. Carotid artery stenosis: Very mild plaque on previous carotid Doppler 04/16/2014, recommend repeat ultrasound on as needed basis    Medication Adjustments/Labs and Tests Ordered: Current medicines are reviewed at length with the patient today.  Concerns regarding medicines are outlined above.  Medication changes, Labs and Tests ordered today are listed in the Patient Instructions below. Patient Instructions  Your physician recommends that you continue on your current medications as directed. Please refer to the Current Medication list given to you today.   Your physician recommends that you schedule a follow-up appointment in: 3 MONTHS  WITH  DR  CRENSHAW   IF  HAS  AN  EPISODE  OF AFIB  MAY  TAKE  AN  ADDITIONAL METOPROLOL  AND CALL  OFFICE      Weston Brass Almyra Deforest, [place]  09/23/2016 11:27 PM    [facility] [place], [place],   [address] Phone: [phone]; Fax: [phone]

## 2016-09-23 NOTE — Patient Instructions (Addendum)
Your physician recommends that you continue on your current medications as directed. Please refer to the Current Medication list given to you today.   Your physician recommends that you schedule a follow-up appointment in: 3 MONTHS  WITH  DR  CRENSHAW   IF  HAS  AN  EPISODE  OF AFIB  MAY  TAKE  AN  ADDITIONAL METOPROLOL  AND CALL  OFFICE

## 2016-09-25 ENCOUNTER — Ambulatory Visit (INDEPENDENT_AMBULATORY_CARE_PROVIDER_SITE_OTHER): Payer: Medicare Other | Admitting: *Deleted

## 2016-09-25 DIAGNOSIS — Z7901 Long term (current) use of anticoagulants: Secondary | ICD-10-CM | POA: Diagnosis not present

## 2016-09-25 DIAGNOSIS — I82409 Acute embolism and thrombosis of unspecified deep veins of unspecified lower extremity: Secondary | ICD-10-CM | POA: Diagnosis not present

## 2016-09-25 DIAGNOSIS — Z5181 Encounter for therapeutic drug level monitoring: Secondary | ICD-10-CM | POA: Diagnosis not present

## 2016-09-25 DIAGNOSIS — I2699 Other pulmonary embolism without acute cor pulmonale: Secondary | ICD-10-CM | POA: Diagnosis not present

## 2016-09-25 DIAGNOSIS — I6529 Occlusion and stenosis of unspecified carotid artery: Secondary | ICD-10-CM

## 2016-09-25 LAB — POCT INR: INR: 3.5

## 2016-09-30 ENCOUNTER — Ambulatory Visit: Payer: Medicare Other | Admitting: Physician Assistant

## 2016-10-12 ENCOUNTER — Ambulatory Visit (INDEPENDENT_AMBULATORY_CARE_PROVIDER_SITE_OTHER): Payer: Medicare Other | Admitting: *Deleted

## 2016-10-12 DIAGNOSIS — I82409 Acute embolism and thrombosis of unspecified deep veins of unspecified lower extremity: Secondary | ICD-10-CM

## 2016-10-12 DIAGNOSIS — I2699 Other pulmonary embolism without acute cor pulmonale: Secondary | ICD-10-CM

## 2016-10-12 DIAGNOSIS — Z5181 Encounter for therapeutic drug level monitoring: Secondary | ICD-10-CM

## 2016-10-12 DIAGNOSIS — I6529 Occlusion and stenosis of unspecified carotid artery: Secondary | ICD-10-CM

## 2016-10-12 DIAGNOSIS — Z7901 Long term (current) use of anticoagulants: Secondary | ICD-10-CM | POA: Diagnosis not present

## 2016-10-12 LAB — POCT INR: INR: 2.4

## 2016-10-30 ENCOUNTER — Other Ambulatory Visit: Payer: Self-pay | Admitting: Cardiology

## 2016-11-02 ENCOUNTER — Ambulatory Visit (INDEPENDENT_AMBULATORY_CARE_PROVIDER_SITE_OTHER): Payer: Medicare Other

## 2016-11-02 ENCOUNTER — Encounter (INDEPENDENT_AMBULATORY_CARE_PROVIDER_SITE_OTHER): Payer: Self-pay

## 2016-11-02 DIAGNOSIS — Z7901 Long term (current) use of anticoagulants: Secondary | ICD-10-CM | POA: Diagnosis not present

## 2016-11-02 DIAGNOSIS — Z5181 Encounter for therapeutic drug level monitoring: Secondary | ICD-10-CM | POA: Diagnosis not present

## 2016-11-02 DIAGNOSIS — I82409 Acute embolism and thrombosis of unspecified deep veins of unspecified lower extremity: Secondary | ICD-10-CM | POA: Diagnosis not present

## 2016-11-02 DIAGNOSIS — I6529 Occlusion and stenosis of unspecified carotid artery: Secondary | ICD-10-CM

## 2016-11-02 DIAGNOSIS — I2699 Other pulmonary embolism without acute cor pulmonale: Secondary | ICD-10-CM | POA: Diagnosis not present

## 2016-11-02 LAB — POCT INR: INR: 2.5

## 2016-11-02 NOTE — Telephone Encounter (Signed)
REFILL 

## 2016-11-17 ENCOUNTER — Encounter: Payer: Self-pay | Admitting: Gastroenterology

## 2016-11-17 ENCOUNTER — Ambulatory Visit (AMBULATORY_SURGERY_CENTER): Payer: Medicare Other | Admitting: Gastroenterology

## 2016-11-17 VITALS — BP 133/56 | HR 50 | Temp 97.1°F | Resp 11 | Ht 69.0 in | Wt 185.0 lb

## 2016-11-17 DIAGNOSIS — Z86010 Personal history of colonic polyps: Secondary | ICD-10-CM | POA: Diagnosis present

## 2016-11-17 DIAGNOSIS — R1310 Dysphagia, unspecified: Secondary | ICD-10-CM | POA: Diagnosis not present

## 2016-11-17 DIAGNOSIS — K208 Other esophagitis: Secondary | ICD-10-CM | POA: Diagnosis not present

## 2016-11-17 DIAGNOSIS — D123 Benign neoplasm of transverse colon: Secondary | ICD-10-CM

## 2016-11-17 DIAGNOSIS — D124 Benign neoplasm of descending colon: Secondary | ICD-10-CM

## 2016-11-17 MED ORDER — SODIUM CHLORIDE 0.9 % IV SOLN: 500.0000 mL | INTRAVENOUS | Status: DC

## 2016-11-17 NOTE — Patient Instructions (Addendum)
RESUME YOUR COUMADIN ON Wednesday, 11/18/16.   YOU HAD AN ENDOSCOPIC PROCEDURE TODAY AT [facility]:   Refer to the procedure report that was given to you for any specific questions about what was found during the examination.  If the procedure report does not answer your questions, please call your gastroenterologist to clarify.  If you requested that your care partner not be given the details of your procedure findings, then the procedure report has been included in a sealed envelope for you to review at your convenience later.  YOU SHOULD EXPECT: Some feelings of bloating in the abdomen. Passage of more gas than usual.  Walking can help get rid of the air that was put into your GI tract during the procedure and reduce the bloating. If you had a lower endoscopy (such as a colonoscopy or flexible sigmoidoscopy) you may notice spotting of blood in your stool or on the toilet paper. If you underwent a bowel prep for your procedure, you may not have a normal bowel movement for a few days.  Please Note:  You might notice some irritation and congestion in your nose or some drainage.  This is from the oxygen used during your procedure.  There is no need for concern and it should clear up in a day or so.  SYMPTOMS TO REPORT IMMEDIATELY:   Following lower endoscopy (colonoscopy or flexible sigmoidoscopy):  Excessive amounts of blood in the stool  Significant tenderness or worsening of abdominal pains  Swelling of the abdomen that is new, acute  Fever of 100F or higher   Following upper endoscopy (EGD)  Vomiting of blood or coffee ground material  New chest pain or pain under the shoulder blades  Painful or persistently difficult swallowing  New shortness of breath  Fever of 100F or higher  Black, tarry-looking stools  For urgent or emergent issues, a gastroenterologist can be reached at any hour by calling [phone].   DIET:  CLEAR LIQUIDS FOR 2 HOURS ( UNTIL 1 PM)   AFTER 1 PM UNTIL THE MORNING ONLY SOFT FOODS.  NO ASPIRIN, ASPIRIN PRODUCTS OR NSAIDS (MOTRIN,IBUPROFEN,ADVIL, ALEVE, NAPROSYN ETC) FOR TWO WEEKS, MAY 29,2018. HANDOUTS GIVEN: POST DILATATION DIET, POLYPS, HEMORRHOIDS.    ACTIVITY:  You should plan to take it easy for the rest of today and you should NOT DRIVE or use heavy machinery until tomorrow (because of the sedation medicines used during the test).    FOLLOW UP: Our staff will call the number listed on your records the next business day following your procedure to check on you and address any questions or concerns that you may have regarding the information given to you following your procedure. If we do not reach you, we will leave a message.  However, if you are feeling well and you are not experiencing any problems, there is no need to return our call.  We will assume that you have returned to your regular daily activities without incident.  If any biopsies were taken you will be contacted by phone or by letter within the next 1-3 weeks.  Please call us at [phone] if you have not heard about the biopsies in 3 weeks.    SIGNATURES/CONFIDENTIALITY: You and/or your care partner have signed paperwork which will be entered into your electronic medical record.  These signatures attest to the fact that that the information above on your After Visit Summary has been reviewed and is understood.  Full responsibility of the confidentiality of  this discharge information lies with you and/or your care-partner.

## 2016-11-17 NOTE — Op Note (Signed)
[facility] Patient Name: John [name] Procedure Date: 11/17/2016 10:21 AM MRN: [id number] Endoscopist: Ladene Artist , MD Age: 81 Referring MD:  Date of Birth: 14-Aug-1935 Gender: Male Account #: [id number] Procedure:                Upper GI endoscopy Indications:              Dysphagia Medicines:                Monitored Anesthesia Care Procedure:                Pre-Anesthesia Assessment:                           - Prior to the procedure, a History and Physical                            was performed, and patient medications and                            allergies were reviewed. The patient's tolerance of                            previous anesthesia was also reviewed. The risks                            and benefits of the procedure and the sedation                            options and risks were discussed with the patient.                            All questions were answered, and informed consent                            was obtained. Prior Anticoagulants: The patient has                            taken Coumadin (warfarin), last dose was 5 days                            prior to procedure. ASA Grade Assessment: III - A                            patient with severe systemic disease. After                            reviewing the risks and benefits, the patient was                            deemed in satisfactory condition to undergo the                            procedure.  After obtaining informed consent, the endoscope was                            passed under direct vision. Throughout the                            procedure, the patient's blood pressure, pulse, and                            oxygen saturations were monitored continuously. The                            Model GIF-[id number]) scope was introduced                            through the mouth, and advanced to the second part                            of  duodenum. The upper GI endoscopy was                            accomplished without difficulty. The patient                            tolerated the procedure well. Scope In: Scope Out: Findings:                 The Z-line was variable and was found 38 cm from                            the incisors. Biopsies were taken with a cold                            forceps for histology.                           No endoscopic abnormality was evident in the                            esophagus to explain the patient's complaint of                            dysphagia. It was decided, however, to proceed with                            dilation of the entire esophagus. A guidewire was                            placed and the scope was withdrawn. Dilation was                            performed with a Savary dilator with no resistance  at 16 mm.                           A small hiatal hernia was present.                           The exam of the stomach was otherwise normal.                           The duodenal bulb and second portion of the                            duodenum were normal. Complications:            No immediate complications. Estimated Blood Loss:     Estimated blood loss was minimal. Impression:               - Z-line variable, 38 cm from the incisors.                            Biopsied.                           - No endoscopic esophageal abnormality to explain                            patient's dysphagia. Esophagus dilated. Dilated.                           - Small hiatal hernia.                           - Normal duodenal bulb and second portion of the                            duodenum. Recommendation:           - Patient has a contact number available for                            emergencies. The signs and symptoms of potential                            delayed complications were discussed with the                             patient. Return to normal activities tomorrow.                            Written discharge instructions were provided to the                            patient.                           - Clear liquid diet for 2 hours, then advance as  tolerated to soft diet today. Resume prior diet                            tomorrow.                           - Continue present medications.                           - Await pathology results.                           - If dysphagia persists proceed with MBSS and                            barium esophagram.                           - Resume Coumadin (warfarin) at prior dose                            tomorrow. Refer to managing physician for further                            adjustment of therapy. Ladene Artist, MD 11/17/2016 11:16:30 AM This report has been signed electronically.

## 2016-11-17 NOTE — Progress Notes (Signed)
Spontaneous respirations throughout. VSS. Resting comfortably. To PACU on room air. Report to  [employer].

## 2016-11-17 NOTE — Progress Notes (Signed)
Pt's states no medical or surgical changes since previsit or office visit. 

## 2016-11-17 NOTE — Op Note (Signed)
[place] Patient Name: John [name] Procedure Date: 11/17/2016 10:21 AM MRN: [id number] Endoscopist: Ladene Artist , MD Age: 81 Referring MD:  Date of Birth: Oct 17, 1935 Gender: Male Account #: [id number] Procedure:                Colonoscopy Indications:              Surveillance: Personal history of adenomatous                            polyps on last colonoscopy 5 years ago Medicines:                Monitored Anesthesia Care Procedure:                Pre-Anesthesia Assessment:                           - Prior to the procedure, a History and Physical                            was performed, and patient medications and                            allergies were reviewed. The patient's tolerance of                            previous anesthesia was also reviewed. The risks                            and benefits of the procedure and the sedation                            options and risks were discussed with the patient.                            All questions were answered, and informed consent                            was obtained. Prior Anticoagulants: The patient has                            taken Coumadin (warfarin), last dose was 5 days                            prior to procedure. ASA Grade Assessment: III - A                            patient with severe systemic disease. After                            reviewing the risks and benefits, the patient was                            deemed in satisfactory condition to undergo the  procedure.                           After obtaining informed consent, the colonoscope                            was passed under direct vision. Throughout the                            procedure, the patient's blood pressure, pulse, and                            oxygen saturations were monitored continuously. The                            Model [id number]-[id number]) scope was introduced                   through the anus and advanced to the the cecum,                            identified by appendiceal orifice and ileocecal                            valve. The ileocecal valve, appendiceal orifice,                            and rectum were photographed. The quality of the                            bowel preparation was good. The colonoscopy was                            performed without difficulty. The patient tolerated                            the procedure well. Scope In: 10:46:40 AM Scope Out: 10:59:16 AM Scope Withdrawal Time: 0 hours 10 minutes 27 seconds  Total Procedure Duration: 0 hours 12 minutes 36 seconds  Findings:                 The perianal and digital rectal examinations were                            normal.                           Four sessile polyps were found in the descending                            colon (2) and transverse colon (2). The polyps were                            6 to 7 mm in size. These polyps were removed with a  cold snare. Resection and retrieval were complete.                           Internal hemorrhoids were found during                            retroflexion. The hemorrhoids were small and Grade                            I (internal hemorrhoids that do not prolapse).                           The exam was otherwise without abnormality on                            direct and retroflexion views. Complications:            No immediate complications. Estimated blood loss:                            None. Estimated Blood Loss:     Estimated blood loss: none. Impression:               - Four 6 to 7 mm polyps in the descending colon and                            in the transverse colon, removed with a cold snare.                            Resected and retrieved.                           - Internal hemorrhoids.                           - The examination was otherwise normal on direct                             and retroflexion views. Recommendation:           - Resume Coumadin (warfarin) tomorrow at prior                            dose. Refer to managing physician for further                            adjustment of therapy.                           - Patient has a contact number available for                            emergencies. The signs and symptoms of potential                            delayed complications were discussed with the  patient. Return to normal activities tomorrow.                            Written discharge instructions were provided to the                            patient.                           - Resume previous diet.                           - Continue present medications.                           - Await pathology results.                           - No aspirin, ibuprofen, naproxen, or other                            non-steroidal anti-inflammatory drugs for 2 weeks                            after polyp removal.                           - No repeat colonoscopy due to age. Ladene Artist, MD 11/17/2016 11:12:08 AM This report has been signed electronically.

## 2016-11-17 NOTE — Progress Notes (Signed)
Called to room to assist during endoscopic procedure.  Patient ID and intended procedure confirmed with present staff. Received instructions for my participation in the procedure from the performing physician.  

## 2016-11-18 ENCOUNTER — Telehealth: Payer: Self-pay | Admitting: *Deleted

## 2016-11-18 NOTE — Telephone Encounter (Signed)
  Follow up Call-  Call back number 11/17/2016  Post procedure Call Back phone  # [phone]  Permission to leave phone message Yes  Some recent data might be hidden     Patient questions:  Do you have a fever, pain , or abdominal swelling? No. Pain Score  0 *  Have you tolerated food without any problems? Yes.    Have you been able to return to your normal activities? Yes.    Do you have any questions about your discharge instructions: Diet   No. Medications  No. Follow up visit  No.  Do you have questions or concerns about your Care? No.  Actions: * If pain score is 4 or above: No action needed, pain <4.

## 2016-11-25 ENCOUNTER — Ambulatory Visit (INDEPENDENT_AMBULATORY_CARE_PROVIDER_SITE_OTHER): Payer: Medicare Other | Admitting: *Deleted

## 2016-11-25 DIAGNOSIS — I82409 Acute embolism and thrombosis of unspecified deep veins of unspecified lower extremity: Secondary | ICD-10-CM

## 2016-11-25 DIAGNOSIS — I2699 Other pulmonary embolism without acute cor pulmonale: Secondary | ICD-10-CM | POA: Diagnosis not present

## 2016-11-25 DIAGNOSIS — Z5181 Encounter for therapeutic drug level monitoring: Secondary | ICD-10-CM

## 2016-11-25 DIAGNOSIS — I6529 Occlusion and stenosis of unspecified carotid artery: Secondary | ICD-10-CM

## 2016-11-25 DIAGNOSIS — Z7901 Long term (current) use of anticoagulants: Secondary | ICD-10-CM | POA: Diagnosis not present

## 2016-11-25 LAB — POCT INR: INR: 1.6

## 2016-11-30 ENCOUNTER — Encounter: Payer: Self-pay | Admitting: Gastroenterology

## 2016-12-07 ENCOUNTER — Ambulatory Visit (INDEPENDENT_AMBULATORY_CARE_PROVIDER_SITE_OTHER): Payer: Medicare Other | Admitting: *Deleted

## 2016-12-07 DIAGNOSIS — I2699 Other pulmonary embolism without acute cor pulmonale: Secondary | ICD-10-CM

## 2016-12-07 DIAGNOSIS — Z7901 Long term (current) use of anticoagulants: Secondary | ICD-10-CM | POA: Diagnosis not present

## 2016-12-07 DIAGNOSIS — I6529 Occlusion and stenosis of unspecified carotid artery: Secondary | ICD-10-CM

## 2016-12-07 DIAGNOSIS — I82409 Acute embolism and thrombosis of unspecified deep veins of unspecified lower extremity: Secondary | ICD-10-CM | POA: Diagnosis not present

## 2016-12-07 DIAGNOSIS — Z5181 Encounter for therapeutic drug level monitoring: Secondary | ICD-10-CM

## 2016-12-07 LAB — POCT INR: INR: 2.7

## 2016-12-25 NOTE — Progress Notes (Signed)
HPI: FU coronary artery disease status post coronary bypassing graft, as well as mitral valve repair in January 2009. The patient had an Myoview performed on July 12, 2008. This was normal with an ejection fraction of 66%. Last carotid Dopplers performed in October 2015 showed minimal plaque and fu recommended as needed. Echo 9/17 showed normal LV function; s/p MV repair with mild MS and mild MR; moderate LAE; mild to moderate RVE; mild RAE. Pt also with h/o atrial fibrillation requiring DCCV. Patient underwent cardioversion in the emergency room in March 2018 and June 2018. Since last seen, other than atrial fibrillation he has mild dyspnea on exertion but no chest pain or syncope. No bleeding.  Current Outpatient Prescriptions  Medication Sig Dispense Refill  . amLODipine (NORVASC) 5 MG tablet Take 1 tablet (5 mg total) by mouth daily. 90 tablet 3  . amoxicillin (AMOXIL) 500 MG capsule 2,000 mg. For dental procedures    . Coenzyme Q10 (CO Q-10) 100 MG CAPS Take 200 mg by mouth daily. (Patient taking differently: Take 200 mg by mouth at bedtime. )  0  . doxazosin (CARDURA) 8 MG tablet TAKE 1 TABLET BY MOUTH AT  BEDTIME 90 tablet 1  . ezetimibe (ZETIA) 10 MG tablet Take 1 tablet (10 mg total) by mouth daily. (Patient taking differently: Take 10 mg by mouth at bedtime. ) 90 tablet 3  . losartan (COZAAR) 100 MG tablet Take 1 tablet by mouth  daily 90 tablet 4  . metoprolol (LOPRESSOR) 50 MG tablet Take 1.5 tablets (75 mg total) by mouth 2 (two) times daily. 270 tablet 3  . pantoprazole (PROTONIX) 40 MG tablet Take 1 tablet (40 mg total) by mouth daily before breakfast. 90 tablet 3  . pravastatin (PRAVACHOL) 20 MG tablet Take 1 tablet by mouth  daily 90 tablet 4  . ranitidine (ZANTAC) 150 MG tablet Take 1 tablet (150 mg total) by mouth daily before supper. 30 tablet 11  . Vitamin D, Ergocalciferol, (DRISDOL) 50000 units CAPS capsule Take 1 capsule (50,000 Units total) by mouth every 7 (seven)  days. 12 capsule 4  . warfarin (COUMADIN) 2.5 MG tablet Take 1/2 to 1 tablet by mouth daily as directed by coumadin clinic 90 tablet 0   Current Facility-Administered Medications  Medication Dose Route Frequency Provider Last Rate Last Dose  . 0.9 %  sodium chloride infusion  500 mL Intravenous Continuous Ladene Artist, MD         Past Medical History:  Diagnosis Date  . Anxiety   . Atrial fibrillation ([place])   . CAD (coronary artery disease)    s/p CABG  . Carotid stenosis   . Complication of anesthesia    zenkers  diverticulum  . Deep vein thrombophlebitis of leg ([facility])   . DJD (degenerative joint disease)   . Essential hypertension, benign   . GERD (gastroesophageal reflux disease)   . Hemorrhoids   . Hiatal hernia   . Hyperlipidemia   . PE (pulmonary embolism)   . Prostate cancer ([place])   . Sebaceous cyst    neck  . Seborrheic dermatitis   . Tubular adenoma of colon 05/2011    Past Surgical History:  Procedure Laterality Date  . bilateral shoulder surgery    . CORONARY ARTERY BYPASS GRAFT     x2    2009  . I&D EXTREMITY  07/09/2011   Procedure: IRRIGATION AND DEBRIDEMENT EXTREMITY;  Surgeon: Tennis Must;  Location: [facility];  Service: Orthopedics;  Laterality: Left;  . left knee arthroscopy    . MITRAL VALVE REPAIR    . prostate cancer surgery  11/1996   radial retropubic prostatectomy  . TANDEM AND OVOID INSERTION    . VASECTOMY    . ZENKER'S DIVERTICULECTOMY N/A 11/07/2012   Procedure: [id number] DIVERTICULECTOMY ENDOSCOPIC;  Surgeon: Melida Quitter, MD;  Location: [facility];  Service: ENT;  Laterality: N/A;    Social History   Social History  . Marital status: Married    Spouse name: Buyer, retail  . Number of children: 3  . Years of education: N/A   Occupational History  . retired Retired   Social History Main Topics  . Smoking status: Former Smoker    Packs/day: 1.00    Years: 12.00    Types: Cigarettes    Quit date: 07/06/1968  . Smokeless  tobacco: Never Used  . Alcohol use Yes     Comment: social use  . Drug use: No  . Sexual activity: Not on file   Other Topics Concern  . Not on file   Social History Narrative  . No narrative on file    Family History  Problem Relation Age of Onset  . Heart attack Father   . Stroke Mother   . Hypertension Mother   . Hypertension Brother   . Heart disease Brother   . Prostate cancer Brother     ROS: no fevers or chills, productive cough, hemoptysis, dysphasia, odynophagia, melena, hematochezia, dysuria, hematuria, rash, seizure activity, orthopnea, PND, pedal edema, claudication. Remaining systems are negative.  Physical Exam: Well-developed well-nourished in no acute distress.  Skin is warm and dry.  HEENT is normal.  Neck is supple. No bruits. Chest is clear to auscultation with normal expansion.  Cardiovascular exam is regular rate and rhythm.  Abdominal exam nontender or distended. No masses palpated. Extremities show no edema. neuro grossly intact  ECG- sinus bradycardia at a rate of 57. Normal axis.personally reviewed  A/P  1 . Paroxysmal atrial fibrillation-the patient is in sinus rhythm today. Continue Coumadin. He has required 3 cardioversions in the past 6-8 months. We will therefore need to add an antiarrhythmic to help maintain sinus rhythm. I will begin amiodarone 200 mg twice a day for 2 weeks and then 200 mg daily thereafter. Check TSH, liver functions and chest x-ray in 3 months. Given potential interaction with Coumadin we will have him follow-up closely in Coumadin clinic as he may need reduced dose of Coumadin. His baseline heart rate is in the 50s. Decrease metoprolol from 75 twice a day to 50 twice a day and follow.   2 hypertension-blood pressure is elevated. We are also decreasing his metoprolol. Increase amlodipine to 10 mg daily.  3 Coronary artery disease-continue statin. No aspirin given need for anticoagulation.  4 hyperlipidemia-continue  low-dose statin. He is intolerant to higher doses.  5 status post mitral valve repair-continue SBE prophylaxis.  6 carotid artery disease-mild on most recent Dopplers.  [name], MD

## 2016-12-26 ENCOUNTER — Encounter (HOSPITAL_COMMUNITY): Payer: Self-pay | Admitting: Emergency Medicine

## 2016-12-26 ENCOUNTER — Emergency Department (HOSPITAL_COMMUNITY)
Admission: EM | Admit: 2016-12-26 | Discharge: 2016-12-26 | Disposition: A | Discharge status: Home / Self Care | Payer: Medicare Other | Attending: Emergency Medicine | Admitting: Emergency Medicine

## 2016-12-26 ENCOUNTER — Emergency Department (HOSPITAL_COMMUNITY): Payer: Medicare Other

## 2016-12-26 ENCOUNTER — Other Ambulatory Visit: Payer: Self-pay

## 2016-12-26 ENCOUNTER — Telehealth: Payer: Self-pay | Admitting: Internal Medicine

## 2016-12-26 DIAGNOSIS — C61 Malignant neoplasm of prostate: Secondary | ICD-10-CM | POA: Insufficient documentation

## 2016-12-26 DIAGNOSIS — I4891 Unspecified atrial fibrillation: Secondary | ICD-10-CM | POA: Diagnosis not present

## 2016-12-26 DIAGNOSIS — Z87891 Personal history of nicotine dependence: Secondary | ICD-10-CM | POA: Insufficient documentation

## 2016-12-26 DIAGNOSIS — I10 Essential (primary) hypertension: Secondary | ICD-10-CM | POA: Insufficient documentation

## 2016-12-26 DIAGNOSIS — Z79899 Other long term (current) drug therapy: Secondary | ICD-10-CM | POA: Insufficient documentation

## 2016-12-26 DIAGNOSIS — Z951 Presence of aortocoronary bypass graft: Secondary | ICD-10-CM | POA: Insufficient documentation

## 2016-12-26 DIAGNOSIS — R0789 Other chest pain: Secondary | ICD-10-CM | POA: Diagnosis not present

## 2016-12-26 DIAGNOSIS — I2510 Atherosclerotic heart disease of native coronary artery without angina pectoris: Secondary | ICD-10-CM | POA: Diagnosis not present

## 2016-12-26 DIAGNOSIS — R42 Dizziness and giddiness: Secondary | ICD-10-CM | POA: Diagnosis not present

## 2016-12-26 DIAGNOSIS — R000 Tachycardia, unspecified: Secondary | ICD-10-CM | POA: Diagnosis not present

## 2016-12-26 LAB — CBC
HCT: 42.6 % (ref 39.0–52.0)
HEMOGLOBIN: 14.4 g/dL (ref 13.0–17.0)
MCH: 28.3 pg (ref 26.0–34.0)
MCHC: 33.8 g/dL (ref 30.0–36.0)
MCV: 83.7 fL (ref 78.0–100.0)
Platelets: 161 10*3/uL (ref 150–400)
RBC: 5.09 MIL/uL (ref 4.22–5.81)
RDW: 13.7 % (ref 11.5–15.5)
WBC: 7.5 10*3/uL (ref 4.0–10.5)

## 2016-12-26 LAB — BASIC METABOLIC PANEL
ANION GAP: 9 (ref 5–15)
BUN: 20 mg/dL (ref 6–20)
CALCIUM: 9.3 mg/dL (ref 8.9–10.3)
CHLORIDE: 105 mmol/L (ref 101–111)
CO2: 22 mmol/L (ref 22–32)
Creatinine, Ser: 1.15 mg/dL (ref 0.61–1.24)
GFR calc Af Amer: >60 mL/min (ref >60)
GFR calc non Af Amer: 59 mL/min — ABNORMAL LOW (ref >60)
GLUCOSE: 131 mg/dL — AB (ref 65–99)
Potassium: 4.3 mmol/L (ref 3.5–5.1)
Sodium: 136 mmol/L (ref 135–145)

## 2016-12-26 LAB — I-STAT TROPONIN, ED: TROPONIN I, POC: 0 ng/mL (ref 0.00–0.08)

## 2016-12-26 LAB — PROTIME-INR
INR: 2.1
Prothrombin Time: 23.9 seconds — ABNORMAL HIGH (ref 11.4–15.2)

## 2016-12-26 MED ORDER — METOPROLOL TARTRATE 5 MG/5ML IV SOLN
5.0000 mg | Freq: Once | INTRAVENOUS | Status: AC
  Administered 2016-12-26: 5 mg via INTRAVENOUS
  Filled 2016-12-26: qty 5

## 2016-12-26 MED ORDER — PROPOFOL 10 MG/ML IV BOLUS
40.0000 mg | Freq: Once | INTRAVENOUS | Status: AC
  Administered 2016-12-26: 40 mg via INTRAVENOUS
  Filled 2016-12-26: qty 20

## 2016-12-26 NOTE — ED Notes (Signed)
Ambulated in hall at this time.  Highest heart rate noted to be 76 bpm.  Tolerated well.

## 2016-12-26 NOTE — Progress Notes (Signed)
RRT at bedside for synchronize cardioversion. ETCO2 monitoring is 33. No distress or complications noted. MD/RN at bedside.

## 2016-12-26 NOTE — ED Notes (Signed)
Taken to xray at this time. 

## 2016-12-26 NOTE — ED Triage Notes (Signed)
Pt to ER for evaluation of central chest pressure and dizziness this morning. Reports hx of atrial fibrillation and feels he is in currently in it. On coumadin. Pain 3/10 at triage. NAD. A/o x4.

## 2016-12-26 NOTE — Telephone Encounter (Signed)
Patient called and is concerned if he is in atrial fibrillation. He is having some chest tightness associated with it. His HR is 115. Recommended to call 911 and go to the nearest ED to get evaluated. If low risk and heart rate is controlled with medical therapy, he can be discharged home from the ED to follow up as outpatient.

## 2016-12-26 NOTE — ED Provider Notes (Signed)
[facility] DEPT Provider Note   CSN: [id number] Arrival date & time: 12/26/16  1837     History   Chief Complaint Chief Complaint  Patient presents with  . Chest Pain    HPI John [name] is a 81 y.o. male.  HPI   81 yo M with h/o recurrent AFib, on Coumadin, CAD, here with palpitations. Pt states he awoke this Am with mild palpitations and a dull, substernal chest pressure/uneasiness. This has persisted throughout the day with associated DOE and shortness of breath. He took his BP/HR at home and noted his HR was in the 100-120s, which is usual for him when he goes into AFib. He states his sx feel exactly similar to his afib episodes. No new CP or concerning sx. No recent illnesses or med changes.  Past Medical History:  Diagnosis Date  . Anxiety   . Atrial fibrillation ([place])   . CAD (coronary artery disease)    s/p CABG  . Carotid stenosis   . Complication of anesthesia    zenkers  diverticulum  . Deep vein thrombophlebitis of leg ([facility])   . DJD (degenerative joint disease)   . Essential hypertension, benign   . GERD (gastroesophageal reflux disease)   . Hemorrhoids   . Hiatal hernia   . Hyperlipidemia   . PE (pulmonary embolism)   . Prostate cancer ([place])   . Sebaceous cyst    neck  . Seborrheic dermatitis   . Tubular adenoma of colon 05/2011    Patient Active Problem List   Diagnosis Date Noted  . Shingles 09/09/2015  . History of DVT (deep vein thrombosis) 09/21/2014  . History of excision of Zenker's diverticulum 10/04/2013  . Encounter for therapeutic drug monitoring 09/14/2013  . Zenker's diverticulum 09/12/2012  . Dysphagia 03/14/2012  . Bronchitis, acute 10/30/2011  . Colon polyps 09/07/2011  . CAD (coronary artery disease) 03/17/2011  . Panic anxiety syndrome 03/10/2011  . Long term (current) use of anticoagulants 08/31/2010  . Mitral valve disorder 12/10/2009  . PROSTATE CANCER 04/25/2009  . History of pulmonary embolism 04/25/2009  . DEEP  VENOUS THROMBOPHLEBITIS 04/25/2009  . HEMORRHOIDS 04/25/2009  . GASTROESOPHAGEAL REFLUX DISEASE 04/25/2009  . SEBORRHEIC DERMATITIS 04/25/2009  . SEBACEOUS CYST, NECK 04/25/2009  . Osteoarthritis 04/25/2009  . Orthostatic hypotension 04/24/2009  . NECK PAIN 04/24/2009  . DIZZINESS 04/24/2009  . CHEST PAIN UNSPECIFIED 04/24/2009  . ESSENTIAL HYPERTENSION, BENIGN 01/10/2009  . Carotid artery disease ([place]) 01/10/2009  . MITRAL VALVE REPLACEMENT, HX OF 01/10/2009  . Hyperlipemia 10/25/2008  . CAD, ARTERY BYPASS GRAFT 10/25/2008  . Aortic valve disorder 10/25/2008  . ATRIAL FIBRILLATION 10/25/2008    Past Surgical History:  Procedure Laterality Date  . bilateral shoulder surgery    . CORONARY ARTERY BYPASS GRAFT     x2    2009  . I&D EXTREMITY  07/09/2011   Procedure: IRRIGATION AND DEBRIDEMENT EXTREMITY;  Surgeon: Tennis Must;  Location: [facility];  Service: Orthopedics;  Laterality: Left;  . left knee arthroscopy    . MITRAL VALVE REPAIR    . prostate cancer surgery  11/1996   radial retropubic prostatectomy  . TANDEM AND OVOID INSERTION    . VASECTOMY    . ZENKER'S DIVERTICULECTOMY N/A 11/07/2012   Procedure: ZENKER'S DIVERTICULECTOMY ENDOSCOPIC;  Surgeon: Melida Quitter, MD;  Location: [facility];  Service: ENT;  Laterality: N/A;       Home Medications    Prior to Admission medications   Medication Sig Start  Date End Date Taking? Authorizing Provider  amLODipine (NORVASC) 5 MG tablet Take 1 tablet (5 mg total) by mouth daily. 03/19/16   Lelon Perla, MD  amoxicillin (AMOXIL) 500 MG capsule 2,000 mg. For dental procedures 02/27/16   [provider]  Coenzyme Q10 (CO Q-10) 100 MG CAPS Take 200 mg by mouth daily. Patient taking differently: Take 200 mg by mouth at bedtime.  10/10/13   Noralee Space, MD  doxazosin (CARDURA) 8 MG tablet TAKE 1 TABLET BY MOUTH AT  BEDTIME 11/02/16   Lelon Perla, MD  ezetimibe (ZETIA) 10 MG tablet Take 1 tablet (10 mg  total) by mouth daily. Patient taking differently: Take 10 mg by mouth at bedtime.  10/10/13   Noralee Space, MD  losartan (COZAAR) 100 MG tablet Take 1 tablet by mouth  daily 03/02/16   Lelon Perla, MD  metoprolol (LOPRESSOR) 50 MG tablet Take 1.5 tablets (75 mg total) by mouth 2 (two) times daily. 03/02/16   Lelon Perla, MD  pantoprazole (PROTONIX) 40 MG tablet Take 1 tablet (40 mg total) by mouth daily before breakfast. 09/17/16 08/23/19  Esterwood, Amy S, PA-C  pravastatin (PRAVACHOL) 20 MG tablet Take 1 tablet by mouth  daily 03/02/16   Lelon Perla, MD  ranitidine (ZANTAC) 150 MG tablet Take 1 tablet (150 mg total) by mouth daily before supper. 09/17/16   Esterwood, Amy S, PA-C  Vitamin D, Ergocalciferol, (DRISDOL) 50000 units CAPS capsule Take 1 capsule (50,000 Units total) by mouth every 7 (seven) days. 09/14/16   Noralee Space, MD  warfarin (COUMADIN) 2.5 MG tablet Take 1/2 to 1 tablet by mouth daily as directed by coumadin clinic 06/22/16   Lelon Perla, MD    Family History Family History  Problem Relation Age of Onset  . Heart attack Father   . Stroke Mother   . Hypertension Mother   . Hypertension Brother   . Heart disease Brother   . Prostate cancer Brother     Social History Social History  Substance Use Topics  . Smoking status: Former Smoker    Packs/day: 1.00    Years: 12.00    Types: Cigarettes    Quit date: 07/06/1968  . Smokeless tobacco: Never Used  . Alcohol use Yes     Comment: social use     Allergies   Iodine; Iohexol; and Statins   Review of Systems Review of Systems  Constitutional: Positive for fatigue.  Respiratory: Positive for shortness of breath.   Cardiovascular: Positive for chest pain and palpitations.  Neurological: Positive for weakness.  All other systems reviewed and are negative.    Physical Exam Updated Vital Signs BP (!) 147/66   Pulse (!) 57   Temp 98.6 F (37 C) (Oral)   Resp 12   SpO2 95%   Physical  Exam  Constitutional: He is oriented to person, place, and time. He appears well-developed and well-nourished. No distress.  HENT:  Head: Normocephalic and atraumatic.  Eyes: Conjunctivae are normal.  Neck: Neck supple.  Cardiovascular: Normal heart sounds.  An irregularly irregular rhythm present. Tachycardia present.  Exam reveals no friction rub.   No murmur heard. Pulmonary/Chest: Effort normal and breath sounds normal. No respiratory distress. He has no wheezes. He has no rales.  Abdominal: He exhibits no distension.  Musculoskeletal: He exhibits no edema.  Neurological: He is alert and oriented to person, place, and time. He exhibits normal muscle tone.  Skin: Skin is warm.  Capillary refill takes less than 2 seconds.  Psychiatric: He has a normal mood and affect.  Nursing note and vitals reviewed.    ED Treatments / Results  Labs (all labs ordered are listed, but only abnormal results are displayed) Labs Reviewed  BASIC METABOLIC PANEL - Abnormal; Notable for the following:       Result Value   Glucose, Bld 131 (*)    GFR calc non Af Amer 59 (*)    All other components within normal limits  PROTIME-INR - Abnormal; Notable for the following:    Prothrombin Time 23.9 (*)    All other components within normal limits  CBC  I-STAT TROPOININ, ED    EKG  EKG Interpretation  Date/Time:  Saturday December 26 2016 21:45:51 EDT Ventricular Rate:  54 PR Interval:    QRS Duration: 105 QT Interval:  447 QTC Calculation: 424 R Axis:   79 Text Interpretation:  Since last EKG, sinus rhythm has replaced atrial fibrillation Confirmed by Duffy Bruce [phone]) on 12/26/2016 10:29:03 PM       Radiology Dg Chest 2 View  Result Date: 12/26/2016 CLINICAL DATA:  Central chest pressure and dizziness as of this morning with tight pain down the back of his neck; Pt thinks he is in A-fib; Hx of CABG X 2 along with heart valve repair; Non-smoker; Is in care of cardiologist EXAM: CHEST  2 VIEW  COMPARISON:  Chest x-rays dated 09/19/2016 in 02/2026 2017. FINDINGS: Heart size and mediastinal contours are stable. Median sternotomy wires appear intact and stable in alignment. Atherosclerotic changes noted at the aortic arch. Lungs are clear.  No pleural effusion or pneumothorax seen. Stable kyphosis of the thoracic spine. No acute or suspicious osseous finding. IMPRESSION: No active cardiopulmonary disease. No evidence of pneumonia or pulmonary edema. Aortic atherosclerosis. Electronically Signed   By: Franki Cabot M.D.   On: 12/26/2016 20:39    Procedures .Critical Care Performed by: Duffy Bruce Authorized by: Duffy Bruce   Critical care provider statement:    Critical care time (minutes):  35   Critical care time was exclusive of:  Separately billable procedures and treating other patients   Critical care was necessary to treat or prevent imminent or life-threatening deterioration of the following conditions:  Cardiac failure and circulatory failure   Critical care was time spent personally by me on the following activities:  Development of treatment plan with patient or surrogate, evaluation of patient's response to treatment, examination of patient, obtaining history from patient or surrogate, ordering and performing treatments and interventions, ordering and review of laboratory studies, ordering and review of radiographic studies, pulse oximetry, re-evaluation of patient's condition and review of old charts   I assumed direction of critical care for this patient from another provider in my specialty: no   .Cardioversion Date/Time: 12/26/2016 11:46 PM Performed by: Duffy Bruce Authorized by: Duffy Bruce   Consent:    Consent obtained:  Written   Consent given by:  Patient   Risks discussed:  Cutaneous burn, death, induced arrhythmia and pain   Alternatives discussed:  Rate-control medication and alternative treatment Universal protocol:    Procedure explained and  questions answered to patient or proxy's satisfaction: yes     Relevant documents present and verified: yes     Test results available and properly labeled: yes     Imaging studies available: yes     Required blood products, implants, devices, and special equipment available: yes     Patient identity confirmed:  Arm band Pre-procedure details:    Cardioversion basis:  Emergent   Rhythm:  Atrial fibrillation   Electrode placement:  Anterior-posterior Attempt one:    Cardioversion mode:  Synchronous   Shock (Joules):  100   Shock outcome:  Conversion to normal sinus rhythm Post-procedure details:    Patient status:  Awake   Patient tolerance of procedure:  Tolerated well, no immediate complications .Sedation Date/Time: 12/26/2016 11:47 PM Performed by: Duffy Bruce Authorized by: Duffy Bruce   Consent:    Consent obtained:  Verbal   Consent given by:  Patient   Risks discussed:  Inadequate sedation, allergic reaction, nausea, prolonged hypoxia resulting in organ damage, respiratory compromise necessitating ventilatory assistance and intubation, vomiting and prolonged sedation necessitating reversal   Alternatives discussed:  Analgesia without sedation Indications:    Procedure performed:  Cardioversion   Procedure necessitating sedation performed by:  Physician performing sedation   Intended level of sedation:  Deep Pre-sedation assessment:    Time since last food or drink:  4   ASA classification: class 2 - patient with mild systemic disease     Neck mobility: normal     Mouth opening:  3 or more finger widths   Thyromental distance:  3 finger widths   Mallampati score:  I - soft palate, uvula, fauces, pillars visible   Pre-sedation assessments completed and reviewed: airway patency, cardiovascular function, hydration status, mental status, nausea/vomiting, pain level, respiratory function and temperature     History of difficult intubation: no   Immediate pre-procedure  details:    Reassessment: Patient reassessed immediately prior to procedure     Reviewed: vital signs     Verified: bag valve mask available   Procedure details (see MAR for exact dosages):    Preoxygenation:  Nasal cannula   Sedation:  Propofol   Intra-procedure monitoring:  Blood pressure monitoring, continuous capnometry, continuous pulse oximetry, frequent LOC assessments, frequent vital sign checks and cardiac monitor   Intra-procedure events: none   Post-procedure details:    Attendance: Constant attendance by certified staff until patient recovered     Recovery: Patient returned to pre-procedure baseline     Post-sedation assessments completed and reviewed: airway patency, cardiovascular function, hydration status, mental status, nausea/vomiting, pain level, respiratory function and temperature     Patient is stable for discharge or admission: yes     Patient tolerance:  Tolerated well, no immediate complications   (including critical care time)  Medications Ordered in ED Medications  metoprolol tartrate (LOPRESSOR) injection 5 mg (5 mg Intravenous Given 12/26/16 2049)  propofol (DIPRIVAN) 10 mg/mL bolus/IV push 40 mg (40 mg Intravenous Given 12/26/16 2142)     Initial Impression / Assessment and Plan / ED Course  I have reviewed the triage vital signs and the nursing notes.  Pertinent labs & imaging results that were available during my care of the patient were reviewed by me and considered in my medical decision making (see chart for details).     81 yo well appearing male with h/o pAFib here with recurrent, symptomatic AFib. He has been compliant with his coumadin. Lab work unremarkable. No apparent trigger but pt has h/o similar presentations in the past. He has a mild chest pressure but this is not atypical for him and EKG non-ischemic, trop neg - doubt ischemic event. After informed consent, pt cardioverted electrically as above, tolerated very well. He is ambulatory  without difficulty sp cardioversion, with resolution of sx, neurologically intact. Repeat EKG shows NSR. Will  have him f/u with his Cardiologist, advised adherence with meds, and good return precautions. CHADS2-VASC 2, on Coumadin.  Final Clinical Impressions(s) / ED Diagnoses   Final diagnoses:  Atrial fibrillation with rapid ventricular response [facility])    New Prescriptions Discharge Medication List as of 12/26/2016 10:32 PM       Duffy Bruce, MD 12/26/16 2348

## 2016-12-31 ENCOUNTER — Ambulatory Visit (INDEPENDENT_AMBULATORY_CARE_PROVIDER_SITE_OTHER): Payer: Medicare Other | Admitting: Cardiology

## 2016-12-31 ENCOUNTER — Encounter: Payer: Self-pay | Admitting: Cardiology

## 2016-12-31 VITALS — BP 160/82 | HR 57 | Ht 69.0 in | Wt 180.0 lb

## 2016-12-31 DIAGNOSIS — I4891 Unspecified atrial fibrillation: Secondary | ICD-10-CM | POA: Diagnosis not present

## 2016-12-31 DIAGNOSIS — I2510 Atherosclerotic heart disease of native coronary artery without angina pectoris: Secondary | ICD-10-CM | POA: Diagnosis not present

## 2016-12-31 DIAGNOSIS — I10 Essential (primary) hypertension: Secondary | ICD-10-CM

## 2016-12-31 DIAGNOSIS — I6529 Occlusion and stenosis of unspecified carotid artery: Secondary | ICD-10-CM

## 2016-12-31 DIAGNOSIS — Z79899 Other long term (current) drug therapy: Secondary | ICD-10-CM | POA: Diagnosis not present

## 2016-12-31 MED ORDER — AMIODARONE HCL 200 MG PO TABS: ORAL_TABLET | ORAL | 3 refills | Status: DC

## 2016-12-31 MED ORDER — AMIODARONE HCL 200 MG PO TABS: 200.0000 mg | ORAL_TABLET | Freq: Every day | ORAL | 3 refills | Status: DC

## 2016-12-31 MED ORDER — AMLODIPINE BESYLATE 10 MG PO TABS: 10.0000 mg | ORAL_TABLET | Freq: Every day | ORAL | 3 refills | Status: DC

## 2016-12-31 MED ORDER — METOPROLOL TARTRATE 50 MG PO TABS: 50.0000 mg | ORAL_TABLET | Freq: Two times a day (BID) | ORAL | 3 refills | Status: DC

## 2016-12-31 NOTE — Patient Instructions (Signed)
Medication Instructions:   START AMIODARONE 200 MG TWICE DAILY X 2 WEEKS THEN DECREASE TO 1 TABLET ONCE DAILY  INCREASE AMLODIPINE TO 10 MG ONCE DAILY= 2 OF THE 5 MG TABLETS ONCE DAILY  DECREASE METOPROLOL TO 50 MG TWICE DAILY= 1 OF THE 50 MG TABLETS TWICE DAILY  Follow-Up:  Your physician recommends that you schedule a follow-up appointment in: [place]

## 2017-01-04 ENCOUNTER — Ambulatory Visit (INDEPENDENT_AMBULATORY_CARE_PROVIDER_SITE_OTHER): Payer: Medicare Other | Admitting: *Deleted

## 2017-01-04 DIAGNOSIS — I2699 Other pulmonary embolism without acute cor pulmonale: Secondary | ICD-10-CM | POA: Diagnosis not present

## 2017-01-04 DIAGNOSIS — Z5181 Encounter for therapeutic drug level monitoring: Secondary | ICD-10-CM

## 2017-01-04 DIAGNOSIS — I82409 Acute embolism and thrombosis of unspecified deep veins of unspecified lower extremity: Secondary | ICD-10-CM

## 2017-01-04 DIAGNOSIS — Z7901 Long term (current) use of anticoagulants: Secondary | ICD-10-CM

## 2017-01-04 DIAGNOSIS — I6529 Occlusion and stenosis of unspecified carotid artery: Secondary | ICD-10-CM

## 2017-01-04 LAB — POCT INR: INR: 2.9

## 2017-01-09 DIAGNOSIS — B029 Zoster without complications: Secondary | ICD-10-CM | POA: Diagnosis not present

## 2017-01-18 ENCOUNTER — Ambulatory Visit (INDEPENDENT_AMBULATORY_CARE_PROVIDER_SITE_OTHER): Payer: Medicare Other | Admitting: *Deleted

## 2017-01-18 DIAGNOSIS — I6529 Occlusion and stenosis of unspecified carotid artery: Secondary | ICD-10-CM

## 2017-01-18 DIAGNOSIS — Z5181 Encounter for therapeutic drug level monitoring: Secondary | ICD-10-CM

## 2017-01-18 LAB — POCT INR: INR: 3.4

## 2017-01-25 DIAGNOSIS — H5213 Myopia, bilateral: Secondary | ICD-10-CM | POA: Diagnosis not present

## 2017-01-25 DIAGNOSIS — H2513 Age-related nuclear cataract, bilateral: Secondary | ICD-10-CM | POA: Diagnosis not present

## 2017-02-01 ENCOUNTER — Encounter (INDEPENDENT_AMBULATORY_CARE_PROVIDER_SITE_OTHER): Payer: Self-pay

## 2017-02-01 ENCOUNTER — Ambulatory Visit (INDEPENDENT_AMBULATORY_CARE_PROVIDER_SITE_OTHER): Payer: Medicare Other | Admitting: *Deleted

## 2017-02-01 DIAGNOSIS — Z5181 Encounter for therapeutic drug level monitoring: Secondary | ICD-10-CM

## 2017-02-01 DIAGNOSIS — I6529 Occlusion and stenosis of unspecified carotid artery: Secondary | ICD-10-CM

## 2017-02-01 LAB — POCT INR: INR: 4

## 2017-02-04 DIAGNOSIS — H25812 Combined forms of age-related cataract, left eye: Secondary | ICD-10-CM | POA: Diagnosis not present

## 2017-02-04 DIAGNOSIS — H2512 Age-related nuclear cataract, left eye: Secondary | ICD-10-CM | POA: Diagnosis not present

## 2017-02-15 ENCOUNTER — Ambulatory Visit (INDEPENDENT_AMBULATORY_CARE_PROVIDER_SITE_OTHER): Payer: Medicare Other | Admitting: *Deleted

## 2017-02-15 DIAGNOSIS — Z5181 Encounter for therapeutic drug level monitoring: Secondary | ICD-10-CM | POA: Diagnosis not present

## 2017-02-15 DIAGNOSIS — I6529 Occlusion and stenosis of unspecified carotid artery: Secondary | ICD-10-CM | POA: Diagnosis not present

## 2017-02-15 LAB — POCT INR: INR: 3.5

## 2017-02-18 DIAGNOSIS — H25811 Combined forms of age-related cataract, right eye: Secondary | ICD-10-CM | POA: Diagnosis not present

## 2017-02-18 DIAGNOSIS — H2511 Age-related nuclear cataract, right eye: Secondary | ICD-10-CM | POA: Diagnosis not present

## 2017-03-01 ENCOUNTER — Ambulatory Visit (INDEPENDENT_AMBULATORY_CARE_PROVIDER_SITE_OTHER): Payer: Medicare Other | Admitting: *Deleted

## 2017-03-01 DIAGNOSIS — I6529 Occlusion and stenosis of unspecified carotid artery: Secondary | ICD-10-CM | POA: Diagnosis not present

## 2017-03-01 DIAGNOSIS — Z5181 Encounter for therapeutic drug level monitoring: Secondary | ICD-10-CM

## 2017-03-01 LAB — POCT INR: INR: 3.8

## 2017-03-01 MED ORDER — WARFARIN SODIUM 1 MG PO TABS: 1.0000 mg | ORAL_TABLET | Freq: Every day | ORAL | 0 refills | Status: DC

## 2017-03-15 ENCOUNTER — Ambulatory Visit (INDEPENDENT_AMBULATORY_CARE_PROVIDER_SITE_OTHER): Payer: Medicare Other | Admitting: *Deleted

## 2017-03-15 ENCOUNTER — Telehealth: Payer: Self-pay | Admitting: *Deleted

## 2017-03-15 DIAGNOSIS — I480 Paroxysmal atrial fibrillation: Secondary | ICD-10-CM

## 2017-03-15 DIAGNOSIS — Z5181 Encounter for therapeutic drug level monitoring: Secondary | ICD-10-CM

## 2017-03-15 LAB — POCT INR: INR: 2.1

## 2017-03-15 NOTE — Telephone Encounter (Signed)
-----   Message from Lelon Perla, MD sent at 03/15/2017  9:35 AM EDT ----- John [name]  ----- Message ----- From: Zenovia Jarred, RN Sent: 03/15/2017   8:52 AM To: Lelon Perla, MD  Pt seen today in coumadin clinic states he just feels crummy.  C/O SOB over the past week and States it is worsening with activity. Ankles have started to swell also within this past week. He states he checked his B/P at home and it has been normal. Today in office 130/60. O2 sat 96%, HR at home he states have been 35-50. Today HR  is 50 in office. Pt states he feels that all of his symptoms are from Amiodarone. He did not take it this morning because of this and is afraid after reviewing information on the internet. Please contact pt @ [phone].

## 2017-03-15 NOTE — Telephone Encounter (Signed)
Spoke with pt, Follow up scheduled with hao meng pa.

## 2017-03-18 ENCOUNTER — Encounter: Payer: Self-pay | Admitting: Physician Assistant

## 2017-03-18 ENCOUNTER — Ambulatory Visit (INDEPENDENT_AMBULATORY_CARE_PROVIDER_SITE_OTHER): Payer: Medicare Other | Admitting: Physician Assistant

## 2017-03-18 VITALS — BP 126/54 | HR 50 | Ht 69.0 in | Wt 188.0 lb

## 2017-03-18 DIAGNOSIS — I4891 Unspecified atrial fibrillation: Secondary | ICD-10-CM

## 2017-03-18 DIAGNOSIS — Z79899 Other long term (current) drug therapy: Secondary | ICD-10-CM | POA: Diagnosis not present

## 2017-03-18 DIAGNOSIS — E785 Hyperlipidemia, unspecified: Secondary | ICD-10-CM

## 2017-03-18 DIAGNOSIS — I739 Peripheral vascular disease, unspecified: Secondary | ICD-10-CM

## 2017-03-18 DIAGNOSIS — Z9889 Other specified postprocedural states: Secondary | ICD-10-CM | POA: Diagnosis not present

## 2017-03-18 DIAGNOSIS — R600 Localized edema: Secondary | ICD-10-CM | POA: Diagnosis not present

## 2017-03-18 DIAGNOSIS — I779 Disorder of arteries and arterioles, unspecified: Secondary | ICD-10-CM

## 2017-03-18 DIAGNOSIS — R001 Bradycardia, unspecified: Secondary | ICD-10-CM | POA: Diagnosis not present

## 2017-03-18 DIAGNOSIS — I10 Essential (primary) hypertension: Secondary | ICD-10-CM | POA: Diagnosis not present

## 2017-03-18 DIAGNOSIS — I25810 Atherosclerosis of coronary artery bypass graft(s) without angina pectoris: Secondary | ICD-10-CM

## 2017-03-18 DIAGNOSIS — I6529 Occlusion and stenosis of unspecified carotid artery: Secondary | ICD-10-CM

## 2017-03-18 MED ORDER — FUROSEMIDE 20 MG PO TABS: 20.0000 mg | ORAL_TABLET | Freq: Every day | ORAL | 3 refills | Status: DC | PRN

## 2017-03-18 MED ORDER — METOPROLOL TARTRATE 25 MG PO TABS: 12.5000 mg | ORAL_TABLET | Freq: Two times a day (BID) | ORAL | 5 refills | Status: DC

## 2017-03-18 NOTE — Progress Notes (Signed)
Cardiology Office Note    Date:  03/19/2017   ID:  John [name], DOB [birth year], MRN [id number]  PCP:  Noralee Space, MD  Cardiologist:  Dr. Stanford Breed   Chief Complaint  Patient presents with  . Medication Problem    pt c/o problem with Amiodarone; he has stopped taking it because he states he could not walk, HR dropped to 35-40 range, and was very tired all the time.    History of Present Illness:  John [name] is a 81 y.o. male with PMH of CAD s/p CABG, mitral valve repair Jan 2009, Carotid artery disease, hyperlipidemia and history of atrial fibrillation. He had myoview in January 2010 which showed normal perfusion with EF 66%. Carotid Doppler obtained in October 2015 showed minimal plaque and follow-up Doppler on a as-needed basis. Last echocardiogram in September 2017 showed normal LV function, stable mitral valve repair with mild MS and mild MR, moderate LAE, mild to moderate RVE. He had cardioversion in the ED in March 2018 and also June 2018. He is taking Coumadin. Due to the fact that he had 3 cardioversions in the past 6-8 month, amiodarone was added to his medical regimen during the last office visit on 12/23/2016. Amlodipine was increased to 10 mg daily for elevated blood pressure.  He presents today for cardiology office visit. He has not taking amiodarone since Sunday. He says his dropping his heart rate down to the 30s and 40s. He was feeling very fatigued. His heart rate today is 50 bpm. He denies any recent chest pain. He does have at least 1+ pitting edema bilaterally. I do not think he needed a scheduled diuretic, I did give him Lasix 20 mg on as-needed basis. As far as his atrial fibrillation, so far he has had 3 cardioversions, without antiarrhythmic therapy, he likely will have recurrence of atrial fibrillation. We discussed various options. He is not a candidate for class IC antiarrhythmic medication given history of structural heart disease and bypass surgery. He is not a  candidate for sotalol due to bradycardia. The only options left is either amiodarone or Tikosyn. His hesitant to try Tikosyn at this time, I recommended a trial of amiodarone but with lower dose of metoprolol. I will drop his metoprolol from 50 mg twice a day down to 12.5 mg twice a day while starting on amiodarone again. I will see the patient back in 5 days to reassess the heart rate.   Past Medical History:  Diagnosis Date  . Anxiety   . Atrial fibrillation ([place])   . CAD (coronary artery disease)    s/p CABG  . Carotid stenosis   . Complication of anesthesia    zenkers  diverticulum  . Deep vein thrombophlebitis of leg ([facility])   . DJD (degenerative joint disease)   . Essential hypertension, benign   . GERD (gastroesophageal reflux disease)   . Hemorrhoids   . Hiatal hernia   . Hyperlipidemia   . PE (pulmonary embolism)   . Prostate cancer ([place])   . Sebaceous cyst    neck  . Seborrheic dermatitis   . Tubular adenoma of colon 05/2011    Past Surgical History:  Procedure Laterality Date  . bilateral shoulder surgery    . CORONARY ARTERY BYPASS GRAFT     x2    2009  . I&D EXTREMITY  07/09/2011   Procedure: IRRIGATION AND DEBRIDEMENT EXTREMITY;  Surgeon: Tennis Must;  Location: [facility];  Service: Orthopedics;  Laterality: Left;  . left knee arthroscopy    . MITRAL VALVE REPAIR    . prostate cancer surgery  11/1996   radial retropubic prostatectomy  . TANDEM AND OVOID INSERTION    . VASECTOMY    . ZENKER'S DIVERTICULECTOMY N/A 11/07/2012   Procedure: ZENKER'S DIVERTICULECTOMY ENDOSCOPIC;  Surgeon: Melida Quitter, MD;  [facility];  Service: ENT;  Laterality: N/A;    Current Medications: Outpatient Medications Prior to Visit  Medication Sig Dispense Refill  . amiodarone (PACERONE) 200 MG tablet TAKE 1 TABLET TWICE DAILY X 2 WEEKS THEN TAKE 1 TABLET DAILY 14 tablet 3  . amLODipine (NORVASC) 10 MG tablet Take 1 tablet (10 mg total) by mouth daily. 90 tablet 3    . amoxicillin (AMOXIL) 500 MG capsule 2,000 mg. For dental procedures    . Coenzyme Q10 (CO Q-10) 100 MG CAPS Take 200 mg by mouth daily. (Patient taking differently: Take 200 mg by mouth at bedtime. )  0  . doxazosin (CARDURA) 8 MG tablet TAKE 1 TABLET BY MOUTH AT  BEDTIME 90 tablet 1  . ezetimibe (ZETIA) 10 MG tablet Take 1 tablet (10 mg total) by mouth daily. (Patient taking differently: Take 10 mg by mouth at bedtime. ) 90 tablet 3  . losartan (COZAAR) 100 MG tablet Take 1 tablet by mouth  daily 90 tablet 4  . pantoprazole (PROTONIX) 40 MG tablet Take 1 tablet (40 mg total) by mouth daily before breakfast. 90 tablet 3  . pravastatin (PRAVACHOL) 20 MG tablet Take 1 tablet by mouth  daily 90 tablet 4  . ranitidine (ZANTAC) 150 MG tablet Take 1 tablet (150 mg total) by mouth daily before supper. 30 tablet 11  . Vitamin D, Ergocalciferol, (DRISDOL) 50000 units CAPS capsule Take 1 capsule (50,000 Units total) by mouth every 7 (seven) days. 12 capsule 4  . warfarin (COUMADIN) 1 MG tablet Take 1 tablet (1 mg total) by mouth daily. 90 tablet 0  . metoprolol tartrate (LOPRESSOR) 50 MG tablet Take 1 tablet (50 mg total) by mouth 2 (two) times daily. 270 tablet 3   Facility-Administered Medications Prior to Visit  Medication Dose Route Frequency Provider Last Rate Last Dose  . 0.9 %  sodium chloride infusion  500 mL Intravenous Continuous Ladene Artist, MD         Allergies:   Iodine; Iohexol; and Statins   Social History   Social History  . Marital status: Married    Spouse name: Buyer, retail  . Number of children: 3  . Years of education: N/A   Occupational History  . retired Retired   Social History Main Topics  . Smoking status: Former Smoker    Packs/day: 1.00    Years: 12.00    Types: Cigarettes    Quit date: 07/06/1968  . Smokeless tobacco: Never Used  . Alcohol use Yes     Comment: social use  . Drug use: No  . Sexual activity: Not Asked   Other Topics Concern  . None    Social History Narrative  . None     Family History:  The patient's family history includes Heart attack in his father; Heart disease in his brother; Hypertension in his brother and mother; Prostate cancer in his brother; Stroke in his mother.   ROS:   Please see the history of present illness.    ROS All other systems reviewed and are negative.   PHYSICAL EXAM:   VS:  BP (!) 126/54 (BP Location:  Left Arm, Patient Position: Sitting, Cuff Size: Normal)   Pulse (!) 50   Ht 5\' 9"  (1.753 m)   Wt 188 lb (85.3 kg)   BMI 27.76 kg/m    GEN: Well nourished, well developed, in no acute distress  HEENT: normal  Neck: no JVD, carotid bruits, or masses Cardiac: RRR; no murmurs, rubs, or gallops. 1+ edema  Respiratory:  clear to auscultation bilaterally, normal work of breathing GI: soft, nontender, nondistended, + BS MS: no deformity or atrophy  Skin: warm and dry, no rash Neuro:  Alert and Oriented x 3, Strength and sensation are intact Psych: euthymic mood, full affect  Wt Readings from Last 3 Encounters:  03/18/17 188 lb (85.3 kg)  12/31/16 180 lb (81.6 kg)  11/17/16 185 lb (83.9 kg)      Studies/Labs Reviewed:   EKG:  EKG is ordered today.  The ekg ordered today demonstrates Sinus bradycardia, heart rate 50, no ischemia  Recent Labs: 09/10/2016: ALT 24; TSH 5.01 12/26/2016: BUN 20; Creatinine, Ser 1.15; Hemoglobin 14.4; Platelets 161; Potassium 4.3; Sodium 136   Lipid Panel    Component Value Date/Time   CHOL 180 09/10/2016 0938   TRIG 72.0 09/10/2016 0938   HDL 52.00 09/10/2016 0938   CHOLHDL 3 09/10/2016 0938   VLDL 14.4 09/10/2016 0938   LDLCALC 114 (H) 09/10/2016 0938   LDLDIRECT 155.5 03/11/2011 0828    Additional studies/ records that were reviewed today include:   Echo 03/19/2016 LV EF: 55% -   60%  Study Conclusions  - Left ventricle: The cavity size was normal. Wall thickness was   increased in a pattern of mild LVH. Systolic function was normal.    The estimated ejection fraction was in the range of 55% to 60%.   Indeterminant diastolic function (s/p mitral valve repair). Wall   motion was normal; there were no regional wall motion   abnormalities. - Aortic valve: There was no stenosis. There was trivial   regurgitation. - Mitral valve: Status post mitral valve repair. The findings are   consistent with mild stenosis. There was mild regurgitation.   Pressure half-time: 142 ms. Mean gradient (D): 6 mm Hg. Valve   area by pressure half-time: 1.55 cm^2. - Left atrium: The atrium was moderately dilated. - Right ventricle: The cavity size was mildly to moderately   dilated. Systolic function was normal. - Right atrium: The atrium was mildly dilated. - Tricuspid valve: Peak RV-RA gradient (S): 45 mm Hg. - Pulmonary arteries: PA peak pressure: 48 mm Hg (S). - Inferior vena cava: The vessel was normal in size. The   respirophasic diameter changes were in the normal range (= 50%),   consistent with normal central venous pressure.  Impressions:  - Normal LV size with mild LV hypertrophy. EF 55-60%. Mild to   moderately dilated RV with normal systolic function. Status post   mitral valve repair with mild regurgitation and mild stenosis.   Mild pulmonary hypertension.   ASSESSMENT:    1. Atrial fibrillation, unspecified type ([place])   2. Essential hypertension   3. Medication management   4. Bradycardia   5. Lower extremity edema   6. Coronary artery disease involving coronary bypass graft of native heart without angina pectoris   7. H/O mitral valve repair   8. Bilateral carotid artery disease ([place])   9. Hyperlipidemia, unspecified hyperlipidemia type      PLAN:  In order of problems listed above:  1. Paroxysmal atrial fibrillation: Due to recurrence of  atrial fibrillation despite multiple cardioversion, he was placed on amiodarone therapy, however this created significant bradycardia and dizziness that prompted the  discontinuation of medication. His last dose of amiodarone is Sunday. Due to history of structural heart disease, he is not a candidate for class IC antiarrhythmic medication. Possible options including amiodarone and Tikosyn. He is very hesitant to try Tikosyn at this time. We plan to restart amiodarone with lower dose of metoprolol to reduce the possibility of bradycardia. I will bring the patient back in 5 days for close outpatient follow-up.  2. Lower extremity edema: He has at least 1+ lower extremity edema bilaterally, I do not think he needed a scheduled diuretic. I plan to give him Lasix 20 mg daily. He will need to take for the first 2 days then transition to as needed doses.   3. CAD status post CABG: Denies any recent chest discomfort to suggest angina.  4. Hypertension: Blood pressure stable on today's physical exam.  5. History of aortic valve repair: Last echocardiogram obtained on 03/19/2016 which showed stable aortic valve anatomy.  6. Carotid artery disease: Previous Doppler in 2015 showed minimal disease, follow-up Doppler on an as needed basis  7. Hyperlipidemia: Continue Zetia and Pravachol.    Medication Adjustments/Labs and Tests Ordered: Current medicines are reviewed at length with the patient today.  Concerns regarding medicines are outlined above.  Medication changes, Labs and Tests ordered today are listed in the Patient Instructions below. Patient Instructions  Medication Instructions:   RESUME taking your AMIODARONE tomorrow (9/14)  START METOPROLOL TARTRATE 12.5mg  TWICE DAILY (tablet size is 25mg , please cut these in half and take 1/2 in AM and 1/2 in PM)  ADD FUROSEMIDE (LASIX) AS NEEDED for swelling  Labwork:   none  Testing/Procedures:  none  Follow-Up:  With Almyra Deforest on 9/18 at 2pm  If you need a refill on your cardiac medications before your next appointment, please call your pharmacy.      John [name], [place]  03/19/2017 [facility] [place], [place], [place]  [address] Phone: [phone]; Fax: [phone]

## 2017-03-18 NOTE — Patient Instructions (Signed)
Medication Instructions:   RESUME taking your AMIODARONE tomorrow (9/14)  START METOPROLOL TARTRATE 12.5mg  TWICE DAILY (tablet size is 25mg , please cut these in half and take 1/2 in AM and 1/2 in PM)  ADD FUROSEMIDE (LASIX) AS NEEDED for swelling  Labwork:   none  Testing/Procedures:  none  Follow-Up:  With John [name] on 9/18 at 2pm  If you need a refill on your cardiac medications before your next appointment, please call your pharmacy.

## 2017-03-19 ENCOUNTER — Encounter: Payer: Self-pay | Admitting: Physician Assistant

## 2017-03-19 NOTE — Progress Notes (Signed)
HPI: FU coronary artery disease status post coronary bypassing graft, as well as mitral valve repair in January 2009. The patient had an Myoview performed on July 12, 2008. This was normal with an ejection fraction of 66%. Last carotid Dopplers performed in October 2015 showed minimal plaque and fu recommended as needed. Echo 9/17 showed normal LV function; s/p MV repair with mild MS and mild MR; moderate LAE; mild to moderate RVE; mild RAE. Pt also with h/o atrial fibrillation requiring DCCV. Patient underwent cardioversion in the emergency room in March 2018 and June 2018. Amiodarone added at previous office visit because of recurrent atrial fibrillation. However he was seen Sept 2018 and was noted to be bradycardic. Metoprolol was decreased. Since last seen, he is improving. His energy has improved. He denies dyspnea, exertional chest pain or syncope. He does have pedal edema.  Current Outpatient Prescriptions  Medication Sig Dispense Refill  . amiodarone (PACERONE) 200 MG tablet TAKE 1 TABLET TWICE DAILY X 2 WEEKS THEN TAKE 1 TABLET DAILY 14 tablet 3  . amLODipine (NORVASC) 10 MG tablet Take 1 tablet (10 mg total) by mouth daily. 90 tablet 3  . amoxicillin (AMOXIL) 500 MG capsule 2,000 mg. For dental procedures    . Coenzyme Q10 (CO Q-10) 100 MG CAPS Take 200 mg by mouth daily. (Patient taking differently: Take 200 mg by mouth at bedtime. )  0  . doxazosin (CARDURA) 8 MG tablet TAKE 1 TABLET BY MOUTH AT  BEDTIME 90 tablet 1  . ezetimibe (ZETIA) 10 MG tablet Take 1 tablet (10 mg total) by mouth daily. (Patient taking differently: Take 10 mg by mouth at bedtime. ) 90 tablet 3  . furosemide (LASIX) 20 MG tablet Take 1 tablet (20 mg total) by mouth daily as needed. 30 tablet 3  . losartan (COZAAR) 100 MG tablet Take 1 tablet by mouth  daily 90 tablet 4  . metoprolol tartrate (LOPRESSOR) 25 MG tablet Take 0.5 tablets (12.5 mg total) by mouth 2 (two) times daily. 30 tablet 5  . pantoprazole  (PROTONIX) 40 MG tablet Take 1 tablet (40 mg total) by mouth daily before breakfast. 90 tablet 3  . pravastatin (PRAVACHOL) 20 MG tablet Take 1 tablet by mouth  daily 90 tablet 4  . ranitidine (ZANTAC) 150 MG tablet Take 1 tablet (150 mg total) by mouth daily before supper. 30 tablet 11  . Vitamin D, Ergocalciferol, (DRISDOL) 50000 units CAPS capsule Take 1 capsule (50,000 Units total) by mouth every 7 (seven) days. 12 capsule 4  . warfarin (COUMADIN) 1 MG tablet Take 1 tablet (1 mg total) by mouth daily. 90 tablet 0   Current Facility-Administered Medications  Medication Dose Route Frequency Provider Last Rate Last Dose  . 0.9 %  sodium chloride infusion  500 mL Intravenous Continuous Ladene Artist, MD         Past Medical History:  Diagnosis Date  . Anxiety   . Atrial fibrillation ([place])   . CAD (coronary artery disease)    s/p CABG  . Carotid stenosis   . Complication of anesthesia    zenkers  diverticulum  . Deep vein thrombophlebitis of leg ([facility])   . DJD (degenerative joint disease)   . Essential hypertension, benign   . GERD (gastroesophageal reflux disease)   . Hemorrhoids   . Hiatal hernia   . Hyperlipidemia   . PE (pulmonary embolism)   . Prostate cancer ([place])   . Sebaceous cyst  neck  . Seborrheic dermatitis   . Tubular adenoma of colon 05/2011    Past Surgical History:  Procedure Laterality Date  . bilateral shoulder surgery    . CORONARY ARTERY BYPASS GRAFT     x2    2009  . I&D EXTREMITY  07/09/2011   Procedure: IRRIGATION AND DEBRIDEMENT EXTREMITY;  Surgeon: Tennis Must;  Location: [facility];  Service: Orthopedics;  Laterality: Left;  . left knee arthroscopy    . MITRAL VALVE REPAIR    . prostate cancer surgery  11/1996   radial retropubic prostatectomy  . TANDEM AND OVOID INSERTION    . VASECTOMY    . ZENKER'S DIVERTICULECTOMY N/A 11/07/2012   Procedure: GYIRSW'N DIVERTICULECTOMY ENDOSCOPIC;  Surgeon: Melida Quitter, MD;  Location: [facility];  Service: ENT;  Laterality: N/A;    Social History   Social History  . Marital status: Married    Spouse name: Buyer, retail  . Number of children: 3  . Years of education: N/A   Occupational History  . retired Retired   Social History Main Topics  . Smoking status: Former Smoker    Packs/day: 1.00    Years: 12.00    Types: Cigarettes    Quit date: 07/06/1968  . Smokeless tobacco: Never Used  . Alcohol use Yes     Comment: social use  . Drug use: No  . Sexual activity: Not on file   Other Topics Concern  . Not on file   Social History Narrative  . No narrative on file    Family History  Problem Relation Age of Onset  . Heart attack Father   . Stroke Mother   . Hypertension Mother   . Hypertension Brother   . Heart disease Brother   . Prostate cancer Brother     ROS: no fevers or chills, productive cough, hemoptysis, dysphasia, odynophagia, melena, hematochezia, dysuria, hematuria, rash, seizure activity, orthopnea, PND, pedal edema, claudication. Remaining systems are negative.  Physical Exam: Well-developed well-nourished in no acute distress.  Skin is warm and dry.  HEENT is normal.  Neck is supple.  Chest is clear to auscultation with normal expansion.  Cardiovascular exam is regular rate and rhythm.  Abdominal exam nontender or distended. No masses palpated. Extremities show 1+ edema. neuro grossly intact  A/P  1 Paroxysmal atrial fibrillation-the patient remains in sinus rhythm this morning. We will continue with amiodarone to maintain sinus rhythm. Continue Coumadin. Continue metoprolol. Note patient had chest x-ray September 2018 with no acute abnormalities. TSH was elevated at 14.20. Free T4 normal at 1.24. Alkaline phosphatase mildly elevated at 147. We will add Synthroid and recheck liver functions and TSH in 12 weeks.  2 hypertension-blood pressure is now controlled. However he has developed pedal edema. This may be related to higher dose  amlodipine. Decreased to 5 mg daily. Follow blood pressure and add medications as needed.  3 Coronary artery disease-continue statin. Patient is not on aspirin given need for anticoagulation.  4 prior mitral valve repair-continue SBE prophylaxis. Repeat echocardiogram to assess LV function and mitral valve repair given pedal edema.   5 hyperlipidemia-continue statin. He is intolerant to higher doses.  6 carotid artery disease-continue statin. Mild on most recent Dopplers.  7 elevated TSH-Likely secondary to amiodarone. At Synthroid 25 g daily. Repeat TSH 12 weeks.  [name], MD

## 2017-03-23 ENCOUNTER — Ambulatory Visit (INDEPENDENT_AMBULATORY_CARE_PROVIDER_SITE_OTHER): Payer: Medicare Other | Admitting: Physician Assistant

## 2017-03-23 ENCOUNTER — Other Ambulatory Visit: Payer: Self-pay | Admitting: Cardiology

## 2017-03-23 ENCOUNTER — Encounter: Payer: Self-pay | Admitting: Physician Assistant

## 2017-03-23 VITALS — BP 114/57 | HR 64 | Ht 69.0 in | Wt 183.0 lb

## 2017-03-23 DIAGNOSIS — I25810 Atherosclerosis of coronary artery bypass graft(s) without angina pectoris: Secondary | ICD-10-CM | POA: Diagnosis not present

## 2017-03-23 DIAGNOSIS — R600 Localized edema: Secondary | ICD-10-CM | POA: Diagnosis not present

## 2017-03-23 DIAGNOSIS — I480 Paroxysmal atrial fibrillation: Secondary | ICD-10-CM

## 2017-03-23 DIAGNOSIS — I10 Essential (primary) hypertension: Secondary | ICD-10-CM | POA: Diagnosis not present

## 2017-03-23 DIAGNOSIS — I779 Disorder of arteries and arterioles, unspecified: Secondary | ICD-10-CM | POA: Diagnosis not present

## 2017-03-23 DIAGNOSIS — Z9889 Other specified postprocedural states: Secondary | ICD-10-CM

## 2017-03-23 DIAGNOSIS — Z79899 Other long term (current) drug therapy: Secondary | ICD-10-CM | POA: Diagnosis not present

## 2017-03-23 DIAGNOSIS — E785 Hyperlipidemia, unspecified: Secondary | ICD-10-CM | POA: Diagnosis not present

## 2017-03-23 DIAGNOSIS — Z9189 Other specified personal risk factors, not elsewhere classified: Secondary | ICD-10-CM

## 2017-03-23 DIAGNOSIS — I739 Peripheral vascular disease, unspecified: Secondary | ICD-10-CM

## 2017-03-23 DIAGNOSIS — R001 Bradycardia, unspecified: Secondary | ICD-10-CM

## 2017-03-23 DIAGNOSIS — I6529 Occlusion and stenosis of unspecified carotid artery: Secondary | ICD-10-CM

## 2017-03-23 MED ORDER — FUROSEMIDE 20 MG PO TABS: 20.0000 mg | ORAL_TABLET | Freq: Every day | ORAL | 3 refills | Status: DC

## 2017-03-23 NOTE — Telephone Encounter (Signed)
Rx(s) sent to pharmacy electronically.  

## 2017-03-23 NOTE — Patient Instructions (Addendum)
Medication Instructions:   START taking Lasix (furosemide) 20mg  DAILY instead of as needed.   Labwork:   BMET, TSH, Liver Function Panel in 6 days (Monday the 24th of September) You may obtain these labs in our [facility] office.  Testing/Procedures:  Your physician would like for you to have a chest X-ray in the next week. This can be done at [facility] ([place], [place]).  You do not need an appointment for this exam.  A chest x-ray takes a picture of the organs and structures inside the chest, including the heart, lungs, and blood vessels. This test can show several things, including, whether the heart is enlarges; whether fluid is building up in the lungs; and whether pacemaker / defibrillator leads are still in place.   Follow-Up:  As scheduled with Dr. Stanford Breed  If you need a refill on your cardiac medications before your next appointment, please call your pharmacy.

## 2017-03-23 NOTE — Progress Notes (Addendum)
Cardiology Office Note    Date:  03/25/2017   ID:  John [name], DOB 1936-05-17, MRN [id number]  PCP:  Noralee Space, MD  Cardiologist:  Dr. Stanford Breed   Chief Complaint  Patient presents with  . Follow-up    seen for Dr. Stanford Breed, recent medication adjustment for bradycardia    History of Present Illness:  John [name] is a 81 y.o. male  with PMH of CAD s/p CABG, mitral valve repair Jan 2009, carotid artery disease, hyperlipidemia and history of atrial fibrillation. He had myoview in January 2010 which showed normal perfusion with EF 66%. Carotid Doppler obtained in October 2015 showed minimal plaque and follow-up Doppler on a as-needed basis. Last echocardiogram in September 2017 showed normal LV function, stable mitral valve repair with mild MS and mild MR, moderate LAE, mild to moderate RVE. He had cardioversion in the ED in March 2018 and also June 2018. He is taking Coumadin. Due to the fact that he had 3 cardioversions in the past 6-8 month, amiodarone was added to his medical regimen during the last office visit on 12/23/2016. Amlodipine was increased to 10 mg daily for elevated blood pressure.  I recently saw the patient on 03/18/2017 due to significant weakness and dizziness after starting on amiodarone therapy. After started on amiodarone, he noted his heart rate dipping down to the 30s and 40s. During the last office visit, his heart rate was 50 bpm. I reduced her 50 mg twice a day of metoprolol down to 12.5 mg twice a day and asked him to restart on the amiodarone. He presents today for cardiology office visit. He is no longer having the dizziness he had previously. His heart rate has also improved, now running mostly in the 50s and 60s. He is doing very well from atrial fibrillation perspective, he is maintaining sinus rhythm on the current medication. Otherwise he denies any significant chest pain, orthopnea or PND. He continued to have lower extremity edema, although his weight has  decreased since the last visit. I will change the 20 mg PRN Lasix to daily dosing. He will need a basic metabolic panel in 7 days. He is also due for TSH and liver function test on amiodarone therapy. Dr. Stanford Breed recommended chest x-ray during the last office visit make sure there is no evidence of pulmonary fibrosis.  Past Medical History:  Diagnosis Date  . Anxiety   . Atrial fibrillation ([place])   . CAD (coronary artery disease)    s/p CABG  . Carotid stenosis   . Complication of anesthesia    zenkers  diverticulum  . Deep vein thrombophlebitis of leg (HCC)   . DJD (degenerative joint disease)   . Essential hypertension, benign   . GERD (gastroesophageal reflux disease)   . Hemorrhoids   . Hiatal hernia   . Hyperlipidemia   . PE (pulmonary embolism)   . Prostate cancer ([place])   . Sebaceous cyst    neck  . Seborrheic dermatitis   . Tubular adenoma of colon 05/2011    Past Surgical History:  Procedure Laterality Date  . bilateral shoulder surgery    . CORONARY ARTERY BYPASS GRAFT     x2    2009  . I&D EXTREMITY  07/09/2011   Procedure: IRRIGATION AND DEBRIDEMENT EXTREMITY;  Surgeon: Tennis Must;  Location: [place];  Service: Orthopedics;  Laterality: Left;  . left knee arthroscopy    . MITRAL VALVE REPAIR    . prostate  cancer surgery  11/1996   radial retropubic prostatectomy  . TANDEM AND OVOID INSERTION    . VASECTOMY    . ZENKER'S DIVERTICULECTOMY N/A 11/07/2012   Procedure: ZENKER'S DIVERTICULECTOMY ENDOSCOPIC;  Surgeon: Melida Quitter, MD;  Location: [facility] OR;  Service: ENT;  Laterality: N/A;    Current Medications: Outpatient Medications Prior to Visit  Medication Sig Dispense Refill  . amiodarone (PACERONE) 200 MG tablet TAKE 1 TABLET TWICE DAILY X 2 WEEKS THEN TAKE 1 TABLET DAILY 14 tablet 3  . amLODipine (NORVASC) 10 MG tablet Take 1 tablet (10 mg total) by mouth daily. 90 tablet 3  . amoxicillin (AMOXIL) 500 MG capsule 2,000 mg. For dental procedures     . Coenzyme Q10 (CO Q-10) 100 MG CAPS Take 200 mg by mouth daily. (Patient taking differently: Take 200 mg by mouth at bedtime. )  0  . doxazosin (CARDURA) 8 MG tablet TAKE 1 TABLET BY MOUTH AT  BEDTIME 90 tablet 3  . ezetimibe (ZETIA) 10 MG tablet Take 1 tablet (10 mg total) by mouth daily. (Patient taking differently: Take 10 mg by mouth at bedtime. ) 90 tablet 3  . losartan (COZAAR) 100 MG tablet Take 1 tablet by mouth  daily 90 tablet 4  . metoprolol tartrate (LOPRESSOR) 25 MG tablet Take 0.5 tablets (12.5 mg total) by mouth 2 (two) times daily. 30 tablet 5  . pantoprazole (PROTONIX) 40 MG tablet Take 1 tablet (40 mg total) by mouth daily before breakfast. 90 tablet 3  . pravastatin (PRAVACHOL) 20 MG tablet TAKE 1 TABLET BY MOUTH  DAILY 90 tablet 3  . ranitidine (ZANTAC) 150 MG tablet Take 1 tablet (150 mg total) by mouth daily before supper. 30 tablet 11  . Vitamin D, Ergocalciferol, (DRISDOL) 50000 units CAPS capsule Take 1 capsule (50,000 Units total) by mouth every 7 (seven) days. 12 capsule 4  . warfarin (COUMADIN) 1 MG tablet Take 1 tablet (1 mg total) by mouth daily. 90 tablet 0  . furosemide (LASIX) 20 MG tablet Take 1 tablet (20 mg total) by mouth daily as needed. 30 tablet 3   Facility-Administered Medications Prior to Visit  Medication Dose Route Frequency Provider Last Rate Last Dose  . 0.9 %  sodium chloride infusion  500 mL Intravenous Continuous Ladene Artist, MD         Allergies:   Iodine; Iohexol; and Statins   Social History   Social History  . Marital status: Married    Spouse name: Buyer, retail  . Number of children: 3  . Years of education: N/A   Occupational History  . retired Retired   Social History Main Topics  . Smoking status: Former Smoker    Packs/day: 1.00    Years: 12.00    Types: Cigarettes    Quit date: 07/06/1968  . Smokeless tobacco: Never Used  . Alcohol use Yes     Comment: social use  . Drug use: No  . Sexual activity: Not Asked    Other Topics Concern  . None   Social History Narrative  . None     Family History:  The patient's family history includes Heart attack in his father; Heart disease in his brother; Hypertension in his brother and mother; Prostate cancer in his brother; Stroke in his mother.   ROS:   Please see the history of present illness.    ROS All other systems reviewed and are negative.   PHYSICAL EXAM:   VS:  BP (!) 114/57 (BP  Location: Right Arm, Patient Position: Sitting, Cuff Size: Normal)   Pulse 64   Ht 5\' 9"  (1.753 m)   Wt 183 lb (83 kg)   BMI 27.02 kg/m    GEN: Well nourished, well developed, in no acute distress  HEENT: normal  Neck: no JVD, carotid bruits, or masses Cardiac: RRR; no murmurs, rubs, or gallops. 1+ pitting edema  Respiratory:  clear to auscultation bilaterally, normal work of breathing GI: soft, nontender, nondistended, + BS MS: no deformity or atrophy  Skin: warm and dry, no rash Neuro:  Alert and Oriented x 3, Strength and sensation are intact Psych: euthymic mood, full affect  Wt Readings from Last 3 Encounters:  03/23/17 183 lb (83 kg)  03/18/17 188 lb (85.3 kg)  12/31/16 180 lb (81.6 kg)      Studies/Labs Reviewed:   EKG:  EKG is ordered today.  The ekg ordered today demonstrates Normal sinus rhythm, LVH  Recent Labs: 09/10/2016: ALT 24; TSH 5.01 12/26/2016: BUN 20; Creatinine, Ser 1.15; Hemoglobin 14.4; Platelets 161; Potassium 4.3; Sodium 136   Lipid Panel    Component Value Date/Time   CHOL 180 09/10/2016 0938   TRIG 72.0 09/10/2016 0938   HDL 52.00 09/10/2016 0938   CHOLHDL 3 09/10/2016 0938   VLDL 14.4 09/10/2016 0938   LDLCALC 114 (H) 09/10/2016 0938   LDLDIRECT 155.5 03/11/2011 0828    Additional studies/ records that were reviewed today include:  Echo 03/19/2016 LV EF: 55% - 60%  Study Conclusions  - Left ventricle: The cavity size was normal. Wall thickness was increased in a pattern of mild LVH. Systolic function was  normal. The estimated ejection fraction was in the range of 55% to 60%. Indeterminant diastolic function (s/p mitral valve repair). Wall motion was normal; there were no regional wall motion abnormalities. - Aortic valve: There was no stenosis. There was trivial regurgitation. - Mitral valve: Status post mitral valve repair. The findings are consistent with mild stenosis. There was mild regurgitation. Pressure half-time: 142 ms. Mean gradient (D): 6 mm Hg. Valve area by pressure half-time: 1.55 cm^2. - Left atrium: The atrium was moderately dilated. - Right ventricle: The cavity size was mildly to moderately dilated. Systolic function was normal. - Right atrium: The atrium was mildly dilated. - Tricuspid valve: Peak RV-RA gradient (S): 45 mm Hg. - Pulmonary arteries: PA peak pressure: 48 mm Hg (S). - Inferior vena cava: The vessel was normal in size. The respirophasic diameter changes were in the normal range (= 50%), consistent with normal central venous pressure.  Impressions:  - Normal LV size with mild LV hypertrophy. EF 55-60%. Mild to moderately dilated RV with normal systolic function. Status post mitral valve repair with mild regurgitation and mild stenosis. Mild pulmonary hypertension.     ASSESSMENT:    1. Paroxysmal atrial fibrillation (HCC)   2. Essential hypertension, benign   3. Hyperlipidemia, unspecified hyperlipidemia type   4. On amiodarone therapy   5. Encounter for long-term (current) use of medications   6. At risk for pulmonary fibrosis   7. Bradycardia   8. Lower extremity edema   9. Coronary artery disease involving coronary bypass graft of native heart without angina pectoris   10. H/O mitral valve repair   11. Bilateral carotid artery disease ([place])      PLAN:  In order of problems listed above:  1. Paroxysmal atrial fibrillation: Had significant bradycardia on 50 mg twice a day of metoprolol and low-dose  amiodarone, heart rate dipping  down to 30s and 40s, I have since reduced his metoprolol to 12.5 mg twice a day. He is no longer dizzy and his heart rate is maintaining in the 50s and 60s. He is currently taking Coumadin. Given the need for antiarrhythmic medication, we'll plan for TSH, liver function test and outpatient chest x-ray prior to the next visit.  2. Lower shunt edema: I placed him on 20mg  PRN Lasix, he continued to have at least 1+ pitting edema. I will transition Lasix 20 mg to daily dosing. He will need a basic metabolic panel next week  3. CAD status post CABG: Denies any chest pain  4. Hypertension: Blood pressure stable  5. History of mitral valve repair: Stable on recent echocardiogram 03/19/2016  6. Carotid artery disease: Previous Doppler in 2015 show minimal disease, follow-up as needed  7. Hyperlipidemia: On Zetia and Pravachol, unable to tolerate higher doses of statin. Last LDL obtained on 09/10/2016 was 114, LDL goal is less than 70    Medication Adjustments/Labs and Tests Ordered: Current medicines are reviewed at length with the patient today.  Concerns regarding medicines are outlined above.  Medication changes, Labs and Tests ordered today are listed in the Patient Instructions below. Patient Instructions  Medication Instructions:   START taking Lasix (furosemide) 20mg  DAILY instead of as needed.   Labwork:   BMET, TSH, Liver Function Panel in 6 days (Monday the 24th of September) You may obtain these labs in our [facility] office.  Testing/Procedures:  Your physician would like for you to have a chest X-ray in the next week. This can be done at [place] at [facility] ([place], [place]).  You do not need an appointment for this exam.  A chest x-ray takes a picture of the organs and structures inside the chest, including the heart, lungs, and blood vessels. This test can show several things, including, whether the heart  is enlarges; whether fluid is building up in the lungs; and whether pacemaker / defibrillator leads are still in place.   Follow-Up:  As scheduled with Dr. Stanford Breed  If you need a refill on your cardiac medications before your next appointment, please call your pharmacy.      Hilbert Corrigan, [place]  03/25/2017 7:22 AM    [facility] [place], [place], [place]  [address] Phone: [phone]; Fax: [phone]

## 2017-03-24 ENCOUNTER — Other Ambulatory Visit: Payer: Self-pay | Admitting: Cardiology

## 2017-03-25 ENCOUNTER — Other Ambulatory Visit: Payer: Self-pay

## 2017-03-25 ENCOUNTER — Encounter: Payer: Self-pay | Admitting: Physician Assistant

## 2017-03-25 DIAGNOSIS — Z23 Encounter for immunization: Secondary | ICD-10-CM | POA: Diagnosis not present

## 2017-03-25 MED ORDER — FUROSEMIDE 20 MG PO TABS: 20.0000 mg | ORAL_TABLET | Freq: Every day | ORAL | 3 refills | Status: DC

## 2017-03-29 ENCOUNTER — Other Ambulatory Visit: Payer: Medicare Other | Admitting: *Deleted

## 2017-03-29 ENCOUNTER — Ambulatory Visit (INDEPENDENT_AMBULATORY_CARE_PROVIDER_SITE_OTHER): Payer: Medicare Other | Admitting: *Deleted

## 2017-03-29 ENCOUNTER — Ambulatory Visit
Admission: RE | Admit: 2017-03-29 | Discharge: 2017-03-29 | Disposition: A | Discharge status: Home / Self Care | Payer: Medicare Other | Source: Ambulatory Visit | Attending: Physician Assistant | Admitting: Physician Assistant

## 2017-03-29 DIAGNOSIS — I480 Paroxysmal atrial fibrillation: Secondary | ICD-10-CM | POA: Diagnosis not present

## 2017-03-29 DIAGNOSIS — Z86711 Personal history of pulmonary embolism: Secondary | ICD-10-CM

## 2017-03-29 DIAGNOSIS — Z5181 Encounter for therapeutic drug level monitoring: Secondary | ICD-10-CM | POA: Diagnosis not present

## 2017-03-29 DIAGNOSIS — I10 Essential (primary) hypertension: Secondary | ICD-10-CM | POA: Diagnosis not present

## 2017-03-29 DIAGNOSIS — Z79899 Other long term (current) drug therapy: Secondary | ICD-10-CM | POA: Diagnosis not present

## 2017-03-29 DIAGNOSIS — Z86718 Personal history of other venous thrombosis and embolism: Secondary | ICD-10-CM | POA: Diagnosis not present

## 2017-03-29 DIAGNOSIS — R0602 Shortness of breath: Secondary | ICD-10-CM | POA: Diagnosis not present

## 2017-03-29 LAB — BASIC METABOLIC PANEL
BUN/Creatinine Ratio: 15 (ref 10–24)
BUN: 24 mg/dL (ref 8–27)
CALCIUM: 9.1 mg/dL (ref 8.6–10.2)
CO2: 22 mmol/L (ref 20–29)
CREATININE: 1.61 mg/dL — AB (ref 0.76–1.27)
Chloride: 105 mmol/L (ref 96–106)
GFR, EST AFRICAN AMERICAN: 47 mL/min/{1.73_m2} — AB (ref >59)
GFR, EST NON AFRICAN AMERICAN: 40 mL/min/{1.73_m2} — AB (ref >59)
Glucose: 102 mg/dL — ABNORMAL HIGH (ref 65–99)
Potassium: 4.3 mmol/L (ref 3.5–5.2)
Sodium: 142 mmol/L (ref 134–144)

## 2017-03-29 LAB — HEPATIC FUNCTION PANEL
ALBUMIN: 4.6 g/dL (ref 3.5–4.8)
ALT: 35 IU/L (ref 0–44)
AST: 30 IU/L (ref 0–40)
Alkaline Phosphatase: 147 IU/L — ABNORMAL HIGH (ref 39–117)
Bilirubin Total: 0.6 mg/dL (ref 0.0–1.2)
Bilirubin, Direct: 0.2 mg/dL (ref 0.00–0.40)
TOTAL PROTEIN: 6.7 g/dL (ref 6.0–8.5)

## 2017-03-29 LAB — TSH: TSH: 16.25 u[IU]/mL — AB (ref 0.450–4.500)

## 2017-03-29 LAB — POCT INR: INR: 1.8

## 2017-03-30 ENCOUNTER — Telehealth: Payer: Self-pay | Admitting: *Deleted

## 2017-03-30 DIAGNOSIS — R7989 Other specified abnormal findings of blood chemistry: Secondary | ICD-10-CM

## 2017-03-30 DIAGNOSIS — Z79899 Other long term (current) drug therapy: Secondary | ICD-10-CM

## 2017-03-30 NOTE — Telephone Encounter (Signed)
-----   Message from [place], [place] sent at 03/30/2017 10:12 AM EDT ----- Kidney function worsened, I am concerned that daily dosing of lasix maybe too much for him, go back to previous as needed dosing. Thyroid lab abnormal, check TSH with free T4 again, if thyroid low, will need to change amiodarone to another drug due to concern of toxicity.

## 2017-03-30 NOTE — Telephone Encounter (Signed)
Ideally, obtain TSH and free T4 1-2 days prior to visit.

## 2017-03-30 NOTE — Telephone Encounter (Signed)
Results and recommendations discussed with patient, who verbalized understanding and thanks.  He will stop lasix daily and use PRN. Aware of recommendation for thyroid level recheck.   He is coming in to see Dr. Stanford Breed on Friday for his 3 mo f/u - OK to wait until Friday for the labwork recheck on that day, or should he come in earlier?

## 2017-03-30 NOTE — Telephone Encounter (Signed)
Pt advised, will come in tomorrow for repeat draw.

## 2017-03-31 DIAGNOSIS — Z79899 Other long term (current) drug therapy: Secondary | ICD-10-CM | POA: Diagnosis not present

## 2017-03-31 DIAGNOSIS — R946 Abnormal results of thyroid function studies: Secondary | ICD-10-CM | POA: Diagnosis not present

## 2017-03-31 LAB — T4, FREE: Free T4: 1.24 ng/dL (ref 0.82–1.77)

## 2017-03-31 LAB — TSH: TSH: 14.2 u[IU]/mL — ABNORMAL HIGH (ref 0.450–4.500)

## 2017-04-01 NOTE — Telephone Encounter (Signed)
Likely developing subclinical hypothyroidism, labwork to be sent to PCP's office. Question if related to amiodarone in the past few month. Has appt with Dr. Stanford Breed tomorrow.

## 2017-04-02 ENCOUNTER — Encounter: Payer: Self-pay | Admitting: Cardiology

## 2017-04-02 ENCOUNTER — Ambulatory Visit (INDEPENDENT_AMBULATORY_CARE_PROVIDER_SITE_OTHER): Payer: Medicare Other | Admitting: Cardiology

## 2017-04-02 VITALS — BP 132/54 | HR 62 | Ht 69.0 in | Wt 184.4 lb

## 2017-04-02 DIAGNOSIS — I059 Rheumatic mitral valve disease, unspecified: Secondary | ICD-10-CM | POA: Diagnosis not present

## 2017-04-02 DIAGNOSIS — R5383 Other fatigue: Secondary | ICD-10-CM | POA: Diagnosis not present

## 2017-04-02 DIAGNOSIS — I10 Essential (primary) hypertension: Secondary | ICD-10-CM

## 2017-04-02 DIAGNOSIS — I6529 Occlusion and stenosis of unspecified carotid artery: Secondary | ICD-10-CM | POA: Diagnosis not present

## 2017-04-02 DIAGNOSIS — I2510 Atherosclerotic heart disease of native coronary artery without angina pectoris: Secondary | ICD-10-CM | POA: Diagnosis not present

## 2017-04-02 DIAGNOSIS — Z9889 Other specified postprocedural states: Secondary | ICD-10-CM

## 2017-04-02 MED ORDER — LEVOTHYROXINE SODIUM 25 MCG PO TABS: 25.0000 ug | ORAL_TABLET | Freq: Every day | ORAL | 0 refills | Status: DC

## 2017-04-02 MED ORDER — AMLODIPINE BESYLATE 5 MG PO TABS: 5.0000 mg | ORAL_TABLET | Freq: Every day | ORAL | 3 refills | Status: DC

## 2017-04-02 MED ORDER — LEVOTHYROXINE SODIUM 25 MCG PO TABS: 25.0000 ug | ORAL_TABLET | Freq: Every day | ORAL | 3 refills | Status: DC

## 2017-04-02 NOTE — Patient Instructions (Addendum)
Medication Instructions:  DECREASE- Amlodipine 5 mg daily START- Levothyroxine 25 mcg daily   If you need a refill on your cardiac medications before your next appointment, please call your pharmacy.  Labwork: TSH and Liver function in 12 weeks HERE IN OUR OFFICE AT [facility]  Testing/Procedures: Your physician has requested that you have an echocardiogram. Echocardiography is a painless test that uses sound waves to create images of your heart. It provides your doctor with information about the size and shape of your heart and how well your heart's chambers and valves are working. This procedure takes approximately one hour. There are no restrictions for this procedure.   Follow-Up: Your physician wants you to follow-up in: 3 Months with Dr Stanford Breed.    Thank you for choosing [facility] at [facility]!!

## 2017-04-12 ENCOUNTER — Other Ambulatory Visit: Payer: Self-pay

## 2017-04-12 ENCOUNTER — Ambulatory Visit (INDEPENDENT_AMBULATORY_CARE_PROVIDER_SITE_OTHER): Payer: Medicare Other

## 2017-04-12 ENCOUNTER — Ambulatory Visit (HOSPITAL_COMMUNITY): Payer: Medicare Other | Attending: Internal Medicine

## 2017-04-12 DIAGNOSIS — I4891 Unspecified atrial fibrillation: Secondary | ICD-10-CM | POA: Insufficient documentation

## 2017-04-12 DIAGNOSIS — I2510 Atherosclerotic heart disease of native coronary artery without angina pectoris: Secondary | ICD-10-CM | POA: Insufficient documentation

## 2017-04-12 DIAGNOSIS — I480 Paroxysmal atrial fibrillation: Secondary | ICD-10-CM | POA: Diagnosis not present

## 2017-04-12 DIAGNOSIS — Z9889 Other specified postprocedural states: Secondary | ICD-10-CM | POA: Diagnosis not present

## 2017-04-12 DIAGNOSIS — I059 Rheumatic mitral valve disease, unspecified: Secondary | ICD-10-CM | POA: Diagnosis not present

## 2017-04-12 DIAGNOSIS — I081 Rheumatic disorders of both mitral and tricuspid valves: Secondary | ICD-10-CM | POA: Diagnosis not present

## 2017-04-12 DIAGNOSIS — Z5181 Encounter for therapeutic drug level monitoring: Secondary | ICD-10-CM

## 2017-04-12 DIAGNOSIS — R001 Bradycardia, unspecified: Secondary | ICD-10-CM | POA: Diagnosis not present

## 2017-04-12 LAB — POCT INR: INR: 2.2

## 2017-05-11 ENCOUNTER — Other Ambulatory Visit: Payer: Self-pay | Admitting: Cardiology

## 2017-05-21 ENCOUNTER — Ambulatory Visit (INDEPENDENT_AMBULATORY_CARE_PROVIDER_SITE_OTHER): Payer: Medicare Other | Admitting: Pharmacist

## 2017-05-21 DIAGNOSIS — I4891 Unspecified atrial fibrillation: Secondary | ICD-10-CM | POA: Diagnosis not present

## 2017-05-21 DIAGNOSIS — Z5181 Encounter for therapeutic drug level monitoring: Secondary | ICD-10-CM | POA: Diagnosis not present

## 2017-05-21 LAB — POCT INR: INR: 2.3

## 2017-05-21 NOTE — Patient Instructions (Signed)
Continue on same dosage 1mg  daily.   Recheck INR in 4 weeks.  Call us with a ny bleeding or concerns #  [phone]

## 2017-06-18 ENCOUNTER — Ambulatory Visit (INDEPENDENT_AMBULATORY_CARE_PROVIDER_SITE_OTHER): Payer: Medicare Other | Admitting: *Deleted

## 2017-06-18 DIAGNOSIS — Z5181 Encounter for therapeutic drug level monitoring: Secondary | ICD-10-CM

## 2017-06-18 DIAGNOSIS — I4891 Unspecified atrial fibrillation: Secondary | ICD-10-CM | POA: Diagnosis not present

## 2017-06-18 LAB — POCT INR: INR: 2.1

## 2017-06-30 DIAGNOSIS — I6529 Occlusion and stenosis of unspecified carotid artery: Secondary | ICD-10-CM | POA: Insufficient documentation

## 2017-06-30 DIAGNOSIS — F419 Anxiety disorder, unspecified: Secondary | ICD-10-CM | POA: Insufficient documentation

## 2017-06-30 DIAGNOSIS — T4145XA Adverse effect of unspecified anesthetic, initial encounter: Secondary | ICD-10-CM | POA: Insufficient documentation

## 2017-06-30 DIAGNOSIS — T8859XA Other complications of anesthesia, initial encounter: Secondary | ICD-10-CM | POA: Insufficient documentation

## 2017-06-30 DIAGNOSIS — K219 Gastro-esophageal reflux disease without esophagitis: Secondary | ICD-10-CM | POA: Insufficient documentation

## 2017-06-30 DIAGNOSIS — C61 Malignant neoplasm of prostate: Secondary | ICD-10-CM | POA: Insufficient documentation

## 2017-06-30 DIAGNOSIS — M1990 Unspecified osteoarthritis, unspecified site: Secondary | ICD-10-CM | POA: Insufficient documentation

## 2017-06-30 DIAGNOSIS — E785 Hyperlipidemia, unspecified: Secondary | ICD-10-CM | POA: Insufficient documentation

## 2017-06-30 DIAGNOSIS — K449 Diaphragmatic hernia without obstruction or gangrene: Secondary | ICD-10-CM | POA: Insufficient documentation

## 2017-06-30 DIAGNOSIS — I80209 Phlebitis and thrombophlebitis of unspecified deep vessels of unspecified lower extremity: Secondary | ICD-10-CM | POA: Insufficient documentation

## 2017-07-13 NOTE — Progress Notes (Signed)
HPI: FU coronary artery disease status post coronary bypassing graft, as well as mitral valve repair in January 2009. The patient had an Myoview performed on July 12, 2008. This was normal with an ejection fraction of 66%. Last carotid Dopplers performed in October 2015 showed minimal plaque and fu recommended as needed. Pt also with h/o atrial fibrillation and on amiodarone. Echo repeated 10/18 and showed normal LV function; s/p MV repair; mean gradient 5 mmHg and mild MR; moderate LAE; mild RVE. Since last seen, the patient denies any dyspnea on exertion, orthopnea, PND, pedal edema, palpitations, syncope or chest pain.   Current Outpatient Medications  Medication Sig Dispense Refill  . amiodarone (PACERONE) 200 MG tablet TAKE 1 TABLET TWICE DAILY X 2 WEEKS THEN TAKE 1 TABLET DAILY 14 tablet 3  . amLODipine (NORVASC) 5 MG tablet Take 1 tablet (5 mg total) by mouth daily. 90 tablet 3  . amoxicillin (AMOXIL) 500 MG capsule 2,000 mg. For dental procedures    . Coenzyme Q10 (CO Q-10) 100 MG CAPS Take 200 mg by mouth daily. (Patient taking differently: Take 200 mg by mouth at bedtime. )  0  . doxazosin (CARDURA) 8 MG tablet TAKE 1 TABLET BY MOUTH AT  BEDTIME 90 tablet 3  . ezetimibe (ZETIA) 10 MG tablet Take 1 tablet (10 mg total) by mouth daily. (Patient taking differently: Take 10 mg by mouth at bedtime. ) 90 tablet 3  . furosemide (LASIX) 20 MG tablet Take 20 mg by mouth daily.    Marland Kitchen levothyroxine (SYNTHROID) 25 MCG tablet Take 1 tablet (25 mcg total) by mouth daily before breakfast. 30 tablet 0  . losartan (COZAAR) 100 MG tablet TAKE 1 TABLET BY MOUTH  DAILY 90 tablet 3  . metoprolol tartrate (LOPRESSOR) 25 MG tablet Take 0.5 tablets (12.5 mg total) by mouth 2 (two) times daily. 30 tablet 5  . pantoprazole (PROTONIX) 40 MG tablet Take 1 tablet (40 mg total) by mouth daily before breakfast. 90 tablet 3  . pravastatin (PRAVACHOL) 20 MG tablet TAKE 1 TABLET BY MOUTH  DAILY 90 tablet 3  .  ranitidine (ZANTAC) 150 MG tablet Take 1 tablet (150 mg total) by mouth daily before supper. 30 tablet 11  . Vitamin D, Ergocalciferol, (DRISDOL) 50000 units CAPS capsule Take 1 capsule (50,000 Units total) by mouth every 7 (seven) days. 12 capsule 4  . warfarin (COUMADIN) 1 MG tablet TAKE 1 TABLET BY MOUTH  DAILY 90 tablet 0   Current Facility-Administered Medications  Medication Dose Route Frequency Provider Last Rate Last Dose  . 0.9 %  sodium chloride infusion  500 mL Intravenous Continuous Ladene Artist, MD         Past Medical History:  Diagnosis Date  . Anxiety   . Atrial fibrillation ([place])   . CAD (coronary artery disease)    s/p CABG  . Carotid stenosis   . Complication of anesthesia    zenkers  diverticulum  . Deep vein thrombophlebitis of leg (HCC)   . DJD (degenerative joint disease)   . Essential hypertension, benign   . GERD (gastroesophageal reflux disease)   . Hemorrhoids   . Hiatal hernia   . Hyperlipidemia   . PE (pulmonary embolism)   . Prostate cancer ([place])   . Sebaceous cyst    neck  . Seborrheic dermatitis   . Tubular adenoma of colon 05/2011    Past Surgical History:  Procedure Laterality Date  . bilateral shoulder surgery    .  CORONARY ARTERY BYPASS GRAFT     x2    2009  . I&D EXTREMITY  07/09/2011   Procedure: IRRIGATION AND DEBRIDEMENT EXTREMITY;  Surgeon: Tennis Must;  Location: [facility];  Service: Orthopedics;  Laterality: Left;  . left knee arthroscopy    . MITRAL VALVE REPAIR    . prostate cancer surgery  11/1996   radial retropubic prostatectomy  . TANDEM AND OVOID INSERTION    . VASECTOMY    . ZENKER'S DIVERTICULECTOMY N/A 11/07/2012   Procedure: ZENKER'S DIVERTICULECTOMY ENDOSCOPIC;  Surgeon: Melida Quitter, MD;  Location: [facility];  Service: ENT;  Laterality: N/A;    Social History   Socioeconomic History  . Marital status: Married    Spouse name: Buyer, retail  . Number of children: 3  . Years of education: Not on file   . Highest education level: Not on file  Social Needs  . Financial resource strain: Not on file  . Food insecurity - worry: Not on file  . Food insecurity - inability: Not on file  . Transportation needs - medical: Not on file  . Transportation needs - non-medical: Not on file  Occupational History  . Occupation: retired    Fish farm manager: RETIRED  Tobacco Use  . Smoking status: Former Smoker    Packs/day: 1.00    Years: 12.00    Pack years: 12.00    Types: Cigarettes    Last attempt to quit: 07/06/1968    Years since quitting: 49.0  . Smokeless tobacco: Never Used  Substance and Sexual Activity  . Alcohol use: Yes    Comment: social use  . Drug use: No  . Sexual activity: Not on file  Other Topics Concern  . Not on file  Social History Narrative  . Not on file    Family History  Problem Relation Age of Onset  . Heart attack Father   . Stroke Mother   . Hypertension Mother   . Hypertension Brother   . Heart disease Brother   . Prostate cancer Brother     ROS: arthralgias but no fevers or chills, productive cough, hemoptysis, dysphasia, odynophagia, melena, hematochezia, dysuria, hematuria, rash, seizure activity, orthopnea, PND, pedal edema, claudication. Remaining systems are negative.  Physical Exam: Well-developed well-nourished in no acute distress.  Skin is warm and dry.  HEENT is normal.  Neck is supple.  Chest is clear to auscultation with normal expansion.  Cardiovascular exam is regular rate and rhythm.  Abdominal exam nontender or distended. No masses palpated. Extremities show no edema. neuro grossly intact   A/P  1 paroxysmal atrial fibrillation-patient remains in sinus rhythm today. Continue amiodarone. Check chest x-ray, TSH and liver functions. Continue metoprolol and Coumadin. Check Hgb.  2 coronary artery disease- Patient is not on aspirin given need for anticoagulation.  3 hypertension-blood pressure is controlled. Continue present medications.  Check renal function.  4 prior mitral valve repair-continue SBE prophylaxis.  5 hyperlipidemia-continue statin. He was intolerant to higher doses previously.  6 carotid artery disease-continue statin.  7 history of elevated TSH- Recheck today.  [name], MD

## 2017-07-19 ENCOUNTER — Ambulatory Visit (HOSPITAL_COMMUNITY)
Admission: RE | Admit: 2017-07-19 | Discharge: 2017-07-19 | Disposition: A | Discharge status: Home / Self Care | Payer: Medicare Other | Source: Ambulatory Visit | Attending: Cardiology | Admitting: Cardiology

## 2017-07-19 ENCOUNTER — Ambulatory Visit (INDEPENDENT_AMBULATORY_CARE_PROVIDER_SITE_OTHER): Payer: Medicare Other | Admitting: Cardiology

## 2017-07-19 ENCOUNTER — Encounter: Payer: Self-pay | Admitting: Cardiology

## 2017-07-19 ENCOUNTER — Ambulatory Visit (INDEPENDENT_AMBULATORY_CARE_PROVIDER_SITE_OTHER): Payer: Medicare Other | Admitting: Pharmacist Clinician (PhC)/ Clinical Pharmacy Specialist

## 2017-07-19 VITALS — BP 140/58 | HR 60 | Ht 69.0 in | Wt 187.0 lb

## 2017-07-19 DIAGNOSIS — I4891 Unspecified atrial fibrillation: Secondary | ICD-10-CM

## 2017-07-19 DIAGNOSIS — I480 Paroxysmal atrial fibrillation: Secondary | ICD-10-CM | POA: Insufficient documentation

## 2017-07-19 DIAGNOSIS — Z5181 Encounter for therapeutic drug level monitoring: Secondary | ICD-10-CM | POA: Diagnosis not present

## 2017-07-19 DIAGNOSIS — I10 Essential (primary) hypertension: Secondary | ICD-10-CM

## 2017-07-19 DIAGNOSIS — I2510 Atherosclerotic heart disease of native coronary artery without angina pectoris: Secondary | ICD-10-CM | POA: Diagnosis not present

## 2017-07-19 DIAGNOSIS — E7800 Pure hypercholesterolemia, unspecified: Secondary | ICD-10-CM

## 2017-07-19 DIAGNOSIS — I700 Atherosclerosis of aorta: Secondary | ICD-10-CM | POA: Insufficient documentation

## 2017-07-19 LAB — CBC
Hematocrit: 41.3 % (ref 37.5–51.0)
Hemoglobin: 14.2 g/dL (ref 13.0–17.7)
MCH: 28.7 pg (ref 26.6–33.0)
MCHC: 34.4 g/dL (ref 31.5–35.7)
MCV: 83 fL (ref 79–97)
PLATELETS: 190 10*3/uL (ref 150–379)
RBC: 4.95 x10E6/uL (ref 4.14–5.80)
RDW: 15.5 % — AB (ref 12.3–15.4)
WBC: 5.1 10*3/uL (ref 3.4–10.8)

## 2017-07-19 LAB — BASIC METABOLIC PANEL
BUN/Creatinine Ratio: 24 (ref 10–24)
BUN: 33 mg/dL — ABNORMAL HIGH (ref 8–27)
CALCIUM: 9.3 mg/dL (ref 8.6–10.2)
CHLORIDE: 106 mmol/L (ref 96–106)
CO2: 24 mmol/L (ref 20–29)
Creatinine, Ser: 1.39 mg/dL — ABNORMAL HIGH (ref 0.76–1.27)
GFR calc non Af Amer: 48 mL/min/{1.73_m2} — ABNORMAL LOW (ref >59)
GFR, EST AFRICAN AMERICAN: 55 mL/min/{1.73_m2} — AB (ref >59)
Glucose: 97 mg/dL (ref 65–99)
POTASSIUM: 5.1 mmol/L (ref 3.5–5.2)
SODIUM: 143 mmol/L (ref 134–144)

## 2017-07-19 LAB — HEPATIC FUNCTION PANEL
ALBUMIN: 4.5 g/dL (ref 3.5–4.8)
ALK PHOS: 127 IU/L — AB (ref 39–117)
ALT: 41 IU/L (ref 0–44)
AST: 30 IU/L (ref 0–40)
Bilirubin Total: 0.5 mg/dL (ref 0.0–1.2)
Bilirubin, Direct: 0.17 mg/dL (ref 0.00–0.40)
TOTAL PROTEIN: 6.8 g/dL (ref 6.0–8.5)

## 2017-07-19 LAB — TSH: TSH: 8.89 u[IU]/mL — AB (ref 0.450–4.500)

## 2017-07-19 LAB — POCT INR: INR: 2.3

## 2017-07-19 MED ORDER — APIXABAN 5 MG PO TABS: 5.0000 mg | ORAL_TABLET | Freq: Two times a day (BID) | ORAL | 0 refills | Status: DC

## 2017-07-19 MED ORDER — APIXABAN 5 MG PO TABS: 5.0000 mg | ORAL_TABLET | Freq: Two times a day (BID) | ORAL | 1 refills | Status: DC

## 2017-07-19 NOTE — Patient Instructions (Addendum)
Medication Instructions:   DO NOT TAKE WARFARIN DOSE TODAY  START ELIQUIS 5 MG ONE TABLET TWICE DAILY TAKE FIRST DOSE TOMORROW MORNING  Labwork:  Your physician recommends that you HAVE LAB WORK TODAY  Testing/Procedures:  A chest x-ray takes a picture of the organs and structures inside the chest, including the heart, lungs, and blood vessels. This test can show several things, including, whether the heart is enlarges; whether fluid is building up in the lungs; and whether pacemaker / defibrillator leads are still in place. AT [facility]  Follow-Up:  Your physician wants you to follow-up in: [place] will receive a reminder letter in the mail two months in advance. If you don't receive a letter, please call our office to schedule the follow-up appointment.   If you need a refill on your cardiac medications before your next appointment, please call your pharmacy.

## 2017-07-20 ENCOUNTER — Telehealth: Payer: Self-pay | Admitting: *Deleted

## 2017-07-20 DIAGNOSIS — R7989 Other specified abnormal findings of blood chemistry: Secondary | ICD-10-CM

## 2017-07-20 DIAGNOSIS — I480 Paroxysmal atrial fibrillation: Secondary | ICD-10-CM

## 2017-07-20 DIAGNOSIS — N289 Disorder of kidney and ureter, unspecified: Secondary | ICD-10-CM

## 2017-07-20 MED ORDER — LEVOTHYROXINE SODIUM 50 MCG PO TABS: 50.0000 ug | ORAL_TABLET | Freq: Every day | ORAL | 3 refills | Status: DC

## 2017-07-20 NOTE — Telephone Encounter (Signed)
Spoke with pt, aware of lab results, medication changes and need for repeat labs. Lab orders mailed to the pt

## 2017-07-20 NOTE — Telephone Encounter (Signed)
-----   Message from Lelon Perla, MD sent at 07/19/2017  5:19 PM EST ----- Would change lasix to 20 mg daily only as needed; increase synthroid to 50 micrograms daily; bmet, TSH and free T4 12 weeks [name]

## 2017-07-22 ENCOUNTER — Other Ambulatory Visit: Payer: Self-pay

## 2017-07-31 ENCOUNTER — Other Ambulatory Visit: Payer: Self-pay | Admitting: Cardiology

## 2017-07-31 DIAGNOSIS — Z79899 Other long term (current) drug therapy: Secondary | ICD-10-CM

## 2017-07-31 DIAGNOSIS — I4891 Unspecified atrial fibrillation: Secondary | ICD-10-CM

## 2017-07-31 DIAGNOSIS — I10 Essential (primary) hypertension: Secondary | ICD-10-CM

## 2017-08-11 DIAGNOSIS — D1801 Hemangioma of skin and subcutaneous tissue: Secondary | ICD-10-CM | POA: Diagnosis not present

## 2017-08-11 DIAGNOSIS — L853 Xerosis cutis: Secondary | ICD-10-CM | POA: Diagnosis not present

## 2017-08-11 DIAGNOSIS — D225 Melanocytic nevi of trunk: Secondary | ICD-10-CM | POA: Diagnosis not present

## 2017-08-11 DIAGNOSIS — D2262 Melanocytic nevi of left upper limb, including shoulder: Secondary | ICD-10-CM | POA: Diagnosis not present

## 2017-08-11 DIAGNOSIS — L821 Other seborrheic keratosis: Secondary | ICD-10-CM | POA: Diagnosis not present

## 2017-08-11 DIAGNOSIS — Z85828 Personal history of other malignant neoplasm of skin: Secondary | ICD-10-CM | POA: Diagnosis not present

## 2017-08-11 DIAGNOSIS — D692 Other nonthrombocytopenic purpura: Secondary | ICD-10-CM | POA: Diagnosis not present

## 2017-08-20 ENCOUNTER — Other Ambulatory Visit: Payer: Self-pay | Admitting: Physician Assistant

## 2017-09-09 ENCOUNTER — Other Ambulatory Visit (INDEPENDENT_AMBULATORY_CARE_PROVIDER_SITE_OTHER): Payer: Medicare Other

## 2017-09-09 ENCOUNTER — Encounter: Payer: Self-pay | Admitting: Pulmonary Disease

## 2017-09-09 ENCOUNTER — Ambulatory Visit: Payer: Medicare Other | Admitting: Pulmonary Disease

## 2017-09-09 VITALS — BP 128/62 | HR 56 | Temp 97.8°F | Ht 69.0 in | Wt 185.6 lb

## 2017-09-09 DIAGNOSIS — Z8719 Personal history of other diseases of the digestive system: Secondary | ICD-10-CM

## 2017-09-09 DIAGNOSIS — K219 Gastro-esophageal reflux disease without esophagitis: Secondary | ICD-10-CM

## 2017-09-09 DIAGNOSIS — C61 Malignant neoplasm of prostate: Secondary | ICD-10-CM

## 2017-09-09 DIAGNOSIS — I10 Essential (primary) hypertension: Secondary | ICD-10-CM | POA: Diagnosis not present

## 2017-09-09 DIAGNOSIS — I359 Nonrheumatic aortic valve disorder, unspecified: Secondary | ICD-10-CM

## 2017-09-09 DIAGNOSIS — Z9889 Other specified postprocedural states: Secondary | ICD-10-CM

## 2017-09-09 DIAGNOSIS — M150 Primary generalized (osteo)arthritis: Secondary | ICD-10-CM

## 2017-09-09 DIAGNOSIS — E782 Mixed hyperlipidemia: Secondary | ICD-10-CM | POA: Diagnosis not present

## 2017-09-09 DIAGNOSIS — E559 Vitamin D deficiency, unspecified: Secondary | ICD-10-CM

## 2017-09-09 DIAGNOSIS — I480 Paroxysmal atrial fibrillation: Secondary | ICD-10-CM

## 2017-09-09 DIAGNOSIS — Z86711 Personal history of pulmonary embolism: Secondary | ICD-10-CM

## 2017-09-09 DIAGNOSIS — K635 Polyp of colon: Secondary | ICD-10-CM

## 2017-09-09 DIAGNOSIS — D124 Benign neoplasm of descending colon: Secondary | ICD-10-CM

## 2017-09-09 DIAGNOSIS — M159 Polyosteoarthritis, unspecified: Secondary | ICD-10-CM

## 2017-09-09 DIAGNOSIS — Z86718 Personal history of other venous thrombosis and embolism: Secondary | ICD-10-CM

## 2017-09-09 LAB — LIPID PANEL
CHOLESTEROL: 180 mg/dL (ref 0–200)
HDL: 58.9 mg/dL (ref >39.00)
LDL Cholesterol: 107 mg/dL — ABNORMAL HIGH (ref 0–99)
NONHDL: 121.58
Total CHOL/HDL Ratio: 3
Triglycerides: 72 mg/dL (ref 0.0–149.0)
VLDL: 14.4 mg/dL (ref 0.0–40.0)

## 2017-09-09 LAB — CBC WITH DIFFERENTIAL/PLATELET
Basophils Absolute: 0 10*3/uL (ref 0.0–0.1)
Basophils Relative: 0.6 % (ref 0.0–3.0)
EOS PCT: 2.1 % (ref 0.0–5.0)
Eosinophils Absolute: 0.1 10*3/uL (ref 0.0–0.7)
HCT: 38.4 % — ABNORMAL LOW (ref 39.0–52.0)
Hemoglobin: 12.9 g/dL — ABNORMAL LOW (ref 13.0–17.0)
LYMPHS ABS: 1.2 10*3/uL (ref 0.7–4.0)
Lymphocytes Relative: 20.9 % (ref 12.0–46.0)
MCHC: 33.7 g/dL (ref 30.0–36.0)
MCV: 86.1 fl (ref 78.0–100.0)
MONO ABS: 0.5 10*3/uL (ref 0.1–1.0)
MONOS PCT: 9.7 % (ref 3.0–12.0)
NEUTROS PCT: 66.7 % (ref 43.0–77.0)
Neutro Abs: 3.8 10*3/uL (ref 1.4–7.7)
Platelets: 164 10*3/uL (ref 150.0–400.0)
RBC: 4.45 Mil/uL (ref 4.22–5.81)
RDW: 15.2 % (ref 11.5–15.5)
WBC: 5.7 10*3/uL (ref 4.0–10.5)

## 2017-09-09 LAB — COMPREHENSIVE METABOLIC PANEL
ALBUMIN: 4.2 g/dL (ref 3.5–5.2)
ALT: 20 U/L (ref 0–53)
AST: 18 U/L (ref 0–37)
Alkaline Phosphatase: 112 U/L (ref 39–117)
BILIRUBIN TOTAL: 0.9 mg/dL (ref 0.2–1.2)
BUN: 21 mg/dL (ref 6–23)
CALCIUM: 9.3 mg/dL (ref 8.4–10.5)
CO2: 28 mEq/L (ref 19–32)
CREATININE: 1.33 mg/dL (ref 0.40–1.50)
Chloride: 107 mEq/L (ref 96–112)
GFR: 55.07 mL/min — ABNORMAL LOW (ref >60.00)
Glucose, Bld: 101 mg/dL — ABNORMAL HIGH (ref 70–99)
Potassium: 4.6 mEq/L (ref 3.5–5.1)
Sodium: 141 mEq/L (ref 135–145)
Total Protein: 6.6 g/dL (ref 6.0–8.3)

## 2017-09-09 LAB — PSA: PSA: 0.15 ng/mL (ref 0.10–4.00)
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
## 2017-09-09 NOTE — Patient Instructions (Signed)
Today we updated your med list in our EPIC system...    Continue your current medications the same...  Today we did your follow up FASTING blood work...    We will contact you w/ the results when available...   Continue your regular check ups w/ CARDIOLOGY- DrCrenshaw, and he is adjusting your Thyroid due to the Amiodarone medication...  Call for any questions or if we can be of service in any way.Marland KitchenMarland Kitchen

## 2017-09-12 LAB — VITAMIN D 1,25 DIHYDROXY
VITAMIN D 1, 25 (OH) TOTAL: 30 pg/mL (ref 18–72)
VITAMIN D3 1, 25 (OH): 30 pg/mL
Vitamin D2 1, 25 (OH)2: <8 pg/mL

## 2017-09-13 ENCOUNTER — Other Ambulatory Visit: Payer: Self-pay | Admitting: Cardiology

## 2017-09-13 DIAGNOSIS — I4891 Unspecified atrial fibrillation: Secondary | ICD-10-CM

## 2017-09-14 ENCOUNTER — Other Ambulatory Visit: Payer: Self-pay | Admitting: Internal Medicine

## 2017-09-14 MED ORDER — EZETIMIBE 10 MG PO TABS: 10.0000 mg | ORAL_TABLET | Freq: Every day | ORAL | 3 refills | Status: DC

## 2017-10-04 DIAGNOSIS — I480 Paroxysmal atrial fibrillation: Secondary | ICD-10-CM | POA: Diagnosis not present

## 2017-10-04 DIAGNOSIS — R7989 Other specified abnormal findings of blood chemistry: Secondary | ICD-10-CM | POA: Diagnosis not present

## 2017-10-04 DIAGNOSIS — N289 Disorder of kidney and ureter, unspecified: Secondary | ICD-10-CM | POA: Diagnosis not present

## 2017-10-04 LAB — T4, FREE: FREE T4: 1.68 ng/dL (ref 0.82–1.77)

## 2017-10-04 LAB — BASIC METABOLIC PANEL
BUN/Creatinine Ratio: 14 (ref 10–24)
BUN: 18 mg/dL (ref 8–27)
CALCIUM: 8.9 mg/dL (ref 8.6–10.2)
CO2: 21 mmol/L (ref 20–29)
CREATININE: 1.27 mg/dL (ref 0.76–1.27)
Chloride: 107 mmol/L — ABNORMAL HIGH (ref 96–106)
GFR calc Af Amer: 62 mL/min/{1.73_m2} (ref >59)
GFR calc non Af Amer: 53 mL/min/{1.73_m2} — ABNORMAL LOW (ref >59)
GLUCOSE: 104 mg/dL — AB (ref 65–99)
Potassium: 4.5 mmol/L (ref 3.5–5.2)
Sodium: 142 mmol/L (ref 134–144)

## 2017-10-04 LAB — TSH: TSH: 8.96 u[IU]/mL — AB (ref 0.450–4.500)

## 2017-11-12 ENCOUNTER — Other Ambulatory Visit: Payer: Self-pay | Admitting: Cardiology

## 2018-01-10 ENCOUNTER — Telehealth: Payer: Self-pay | Admitting: Cardiology

## 2018-01-10 NOTE — Telephone Encounter (Signed)
Spoke with patient and he would like to go back to Warfarin secondary to cost and increased bruising/bleeding. Also patient has had increased shortness of breath with exertion last couple of weeks. Scheduled appointment with Lurena Joiner for Friday. Patient denies any black/dark stools or visible bleeding.  He will discuss the Warfarin/Eliquis change

## 2018-01-10 NOTE — Telephone Encounter (Signed)
Pt calling and stating since he has been on Eliquis he bruises really bad and want to know if he need to continue to take it. Pt wish to talk to nurse

## 2018-01-14 ENCOUNTER — Ambulatory Visit (INDEPENDENT_AMBULATORY_CARE_PROVIDER_SITE_OTHER): Payer: Medicare Other | Admitting: Cardiology

## 2018-01-14 ENCOUNTER — Encounter: Payer: Self-pay | Admitting: Cardiology

## 2018-01-14 ENCOUNTER — Ambulatory Visit (INDEPENDENT_AMBULATORY_CARE_PROVIDER_SITE_OTHER): Payer: Medicare Other | Admitting: Pharmacist

## 2018-01-14 DIAGNOSIS — R5383 Other fatigue: Secondary | ICD-10-CM | POA: Diagnosis not present

## 2018-01-14 DIAGNOSIS — I739 Peripheral vascular disease, unspecified: Secondary | ICD-10-CM | POA: Insufficient documentation

## 2018-01-14 DIAGNOSIS — I480 Paroxysmal atrial fibrillation: Secondary | ICD-10-CM | POA: Insufficient documentation

## 2018-01-14 DIAGNOSIS — Z951 Presence of aortocoronary bypass graft: Secondary | ICD-10-CM | POA: Diagnosis not present

## 2018-01-14 DIAGNOSIS — R0989 Other specified symptoms and signs involving the circulatory and respiratory systems: Secondary | ICD-10-CM | POA: Diagnosis not present

## 2018-01-14 DIAGNOSIS — Z7901 Long term (current) use of anticoagulants: Secondary | ICD-10-CM

## 2018-01-14 DIAGNOSIS — I2510 Atherosclerotic heart disease of native coronary artery without angina pectoris: Secondary | ICD-10-CM

## 2018-01-14 DIAGNOSIS — I10 Essential (primary) hypertension: Secondary | ICD-10-CM

## 2018-01-14 DIAGNOSIS — R7989 Other specified abnormal findings of blood chemistry: Secondary | ICD-10-CM

## 2018-01-14 MED ORDER — WARFARIN SODIUM 1 MG PO TABS: 1.0000 mg | ORAL_TABLET | Freq: Every day | ORAL | 3 refills | Status: DC
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
## 2018-01-14 NOTE — Assessment & Plan Note (Signed)
Pt had PE at age 82 and recurrent DVT after this. He has been on anticoagulation since.

## 2018-01-14 NOTE — Patient Instructions (Signed)
Take Eliquis 5mg  twice daily and warfarin 1mg  daily today 7/12, tomorrow 7/13 and Sunday 7/14. Then STOP taking Eliquis and continue taking warfarin 1mg  daily. Next INR in 1 week at [facility].

## 2018-01-14 NOTE — Assessment & Plan Note (Signed)
NSR, SB-HR 47

## 2018-01-14 NOTE — Patient Instructions (Addendum)
Medication Instructions:  DISCONTINUE Metoprolol  Labwork: NONE   Testing/Procedures: Your physician has requested that you have a lower extremity arterial exercise duplex. During this test, exercise and ultrasound are used to evaluate arterial blood flow in the legs. Allow one hour for this exam. There are no restrictions or special instructions. PLEASE SCHEDULE [facility]  Follow-Up: Your physician recommends that you schedule a follow-up appointment in: 2-3 [place], PA-C  Any Other Special Instructions Will Be Listed Below (If Applicable). If you need a refill on your cardiac medications before your next appointment, please call your pharmacy.

## 2018-01-14 NOTE — Assessment & Plan Note (Signed)
secondary to Amio-normal free T4.

## 2018-01-14 NOTE — Assessment & Plan Note (Signed)
H/O CABG x 2 (L-LAD, SVG-OM1) and MV repair-2009

## 2018-01-14 NOTE — Assessment & Plan Note (Signed)
Pt's main complaint today is progressive exertional fatigue.

## 2018-01-14 NOTE — Assessment & Plan Note (Signed)
Pt has bilateral hip pain c/w claudication, bilateral FA bruits, and decreased distal pulses.

## 2018-01-14 NOTE — Assessment & Plan Note (Signed)
Controlled.  

## 2018-01-17 ENCOUNTER — Other Ambulatory Visit: Payer: Self-pay | Admitting: Cardiology

## 2018-01-17 DIAGNOSIS — R0989 Other specified symptoms and signs involving the circulatory and respiratory systems: Secondary | ICD-10-CM

## 2018-01-17 DIAGNOSIS — I739 Peripheral vascular disease, unspecified: Secondary | ICD-10-CM

## 2018-01-20 ENCOUNTER — Ambulatory Visit (INDEPENDENT_AMBULATORY_CARE_PROVIDER_SITE_OTHER): Payer: Medicare Other | Admitting: *Deleted

## 2018-01-20 DIAGNOSIS — Z9889 Other specified postprocedural states: Secondary | ICD-10-CM | POA: Diagnosis not present

## 2018-01-20 DIAGNOSIS — I480 Paroxysmal atrial fibrillation: Secondary | ICD-10-CM | POA: Diagnosis not present

## 2018-01-20 DIAGNOSIS — Z5181 Encounter for therapeutic drug level monitoring: Secondary | ICD-10-CM

## 2018-01-20 DIAGNOSIS — Z86718 Personal history of other venous thrombosis and embolism: Secondary | ICD-10-CM | POA: Diagnosis not present

## 2018-01-20 DIAGNOSIS — Z86711 Personal history of pulmonary embolism: Secondary | ICD-10-CM | POA: Diagnosis not present

## 2018-01-20 LAB — POCT INR: INR: 1.2 — AB (ref 2.0–3.0)

## 2018-01-20 NOTE — Patient Instructions (Signed)
Description   Today July 18th take 1 and 1/2 tablets then tomorrow July 19th take 1 and 1/2 tablets then continue taking warfarin 1mg  (1 tablet)  daily. Next INR in 1 week .

## 2018-01-21 ENCOUNTER — Ambulatory Visit (HOSPITAL_COMMUNITY)
Admission: RE | Admit: 2018-01-21 | Discharge: 2018-01-21 | Disposition: A | Discharge status: Home / Self Care | Payer: Medicare Other | Source: Ambulatory Visit | Attending: Cardiology | Admitting: Cardiology

## 2018-01-21 DIAGNOSIS — R0989 Other specified symptoms and signs involving the circulatory and respiratory systems: Secondary | ICD-10-CM | POA: Diagnosis not present

## 2018-01-21 DIAGNOSIS — I739 Peripheral vascular disease, unspecified: Secondary | ICD-10-CM | POA: Insufficient documentation

## 2018-01-27 ENCOUNTER — Ambulatory Visit (INDEPENDENT_AMBULATORY_CARE_PROVIDER_SITE_OTHER): Payer: Medicare Other | Admitting: *Deleted

## 2018-01-27 DIAGNOSIS — I480 Paroxysmal atrial fibrillation: Secondary | ICD-10-CM

## 2018-01-27 DIAGNOSIS — Z5181 Encounter for therapeutic drug level monitoring: Secondary | ICD-10-CM | POA: Diagnosis not present

## 2018-01-27 LAB — POCT INR: INR: 2.3 (ref 2.0–3.0)

## 2018-01-27 NOTE — Patient Instructions (Signed)
Description   Continue taking warfarin 1mg  (1 tablet)  daily. Next INR in 2 weeks. Call us with any medication changes or concerns, [phone] [facility].

## 2018-02-03 ENCOUNTER — Other Ambulatory Visit: Payer: Self-pay | Admitting: Cardiology

## 2018-02-03 DIAGNOSIS — I10 Essential (primary) hypertension: Secondary | ICD-10-CM

## 2018-02-07 DIAGNOSIS — H33051 Total retinal detachment, right eye: Secondary | ICD-10-CM | POA: Diagnosis not present

## 2018-02-07 DIAGNOSIS — H33021 Retinal detachment with multiple breaks, right eye: Secondary | ICD-10-CM | POA: Diagnosis not present

## 2018-02-07 DIAGNOSIS — Z961 Presence of intraocular lens: Secondary | ICD-10-CM | POA: Diagnosis not present

## 2018-02-07 DIAGNOSIS — H33301 Unspecified retinal break, right eye: Secondary | ICD-10-CM | POA: Diagnosis not present

## 2018-02-08 ENCOUNTER — Ambulatory Visit (INDEPENDENT_AMBULATORY_CARE_PROVIDER_SITE_OTHER): Payer: Medicare Other | Admitting: Cardiology

## 2018-02-08 ENCOUNTER — Encounter: Payer: Self-pay | Admitting: Cardiology

## 2018-02-08 VITALS — BP 149/62 | HR 87 | Ht 69.0 in | Wt 181.0 lb

## 2018-02-08 DIAGNOSIS — Z7901 Long term (current) use of anticoagulants: Secondary | ICD-10-CM

## 2018-02-08 DIAGNOSIS — Z9889 Other specified postprocedural states: Secondary | ICD-10-CM

## 2018-02-08 DIAGNOSIS — R0989 Other specified symptoms and signs involving the circulatory and respiratory systems: Secondary | ICD-10-CM

## 2018-02-08 DIAGNOSIS — R5383 Other fatigue: Secondary | ICD-10-CM | POA: Diagnosis not present

## 2018-02-08 DIAGNOSIS — I10 Essential (primary) hypertension: Secondary | ICD-10-CM | POA: Diagnosis not present

## 2018-02-08 DIAGNOSIS — I6522 Occlusion and stenosis of left carotid artery: Secondary | ICD-10-CM | POA: Diagnosis not present

## 2018-02-08 DIAGNOSIS — Z951 Presence of aortocoronary bypass graft: Secondary | ICD-10-CM | POA: Diagnosis not present

## 2018-02-08 DIAGNOSIS — Z86718 Personal history of other venous thrombosis and embolism: Secondary | ICD-10-CM | POA: Diagnosis not present

## 2018-02-08 DIAGNOSIS — Z01818 Encounter for other preprocedural examination: Secondary | ICD-10-CM | POA: Insufficient documentation

## 2018-02-08 DIAGNOSIS — Z86711 Personal history of pulmonary embolism: Secondary | ICD-10-CM | POA: Diagnosis not present

## 2018-02-08 NOTE — Patient Instructions (Addendum)
Medication Instructions:  Your physician recommends that you continue on your current medications as directed. Please refer to the Current Medication list given to you today.   Labwork: None   Testing/Procedures: Your physician has requested that you have a carotid duplex. This test is an ultrasound of the carotid arteries in your neck. It looks at blood flow through these arteries that supply the brain with blood. Allow one hour for this exam. There are no restrictions or special instructions.  Follow-Up: Your physician recommends that you schedule a follow-up appointment in: 3 months with Dr Stanford Breed.   RESCHEDULE COUMADIN APPOINTMENT TO Wednesday OR Thursday OF NEXT WEEK   Any Other Special Instructions Will Be Listed Below (If Applicable). If you need a refill on your cardiac medications before your next appointment, please call your pharmacy.

## 2018-02-08 NOTE — Progress Notes (Signed)
02/08/2018 John [name]   07/14/1935  [id number]  Primary Physician Noralee Space, MD Primary Cardiologist: Dr Stanford Breed  HPI:  Pleasant 82 y/o male followed by Dr Stanford Breed with a history of PAF on chronic anticoagulation, CAD and MV disease s/p CABG x 2 and MV repair in 2009, and treated HTN. I saw him in the office 01/14/18 with complaints of fatigue. His HR was in th 40's-sinu bradycardia. I had him stop his beta blocker. He is in the office today for follow up and also tell me he has a detatched retina and needs clearance for urgent surgery this week. He was told by Dr Zadie Rhine to stop his Coumadin last PM. The pt denies chest pain or unusual dyspnea. He says he feels better since his Metoprolol was stopped.    Current Outpatient Medications  Medication Sig Dispense Refill  . amiodarone (PACERONE) 200 MG tablet TAKE 1 TABLET BY MOUTH  DAILY 90 tablet 2  . amLODipine (NORVASC) 5 MG tablet TAKE 1 TABLET BY MOUTH  DAILY 90 tablet 3  . amoxicillin (AMOXIL) 500 MG capsule 2,000 mg. For dental procedures    . Coenzyme Q10 (CO Q-10) 100 MG CAPS Take 200 mg by mouth daily. (Patient taking differently: Take 200 mg by mouth at bedtime. )  0  . doxazosin (CARDURA) 8 MG tablet TAKE 1 TABLET BY MOUTH AT  BEDTIME 90 tablet 3  . ezetimibe (ZETIA) 10 MG tablet Take 1 tablet (10 mg total) by mouth daily. 90 tablet 3  . furosemide (LASIX) 20 MG tablet Take 20 mg by mouth daily as needed.    Marland Kitchen levothyroxine (SYNTHROID, LEVOTHROID) 50 MCG tablet Take 1 tablet (50 mcg total) by mouth daily before breakfast. 90 tablet 3  . losartan (COZAAR) 100 MG tablet TAKE 1 TABLET BY MOUTH  DAILY 90 tablet 3  . pantoprazole (PROTONIX) 40 MG tablet TAKE 1 TABLET BY MOUTH  DAILY BEFORE BREAKFAST 90 tablet 3  . pravastatin (PRAVACHOL) 20 MG tablet TAKE 1 TABLET BY MOUTH  DAILY 90 tablet 3  . ranitidine (ZANTAC) 150 MG tablet Take 1 tablet (150 mg total) by mouth daily before supper. (Patient taking differently: Take 150 mg  by mouth daily before supper. As needed) 30 tablet 11  . Vitamin D, Ergocalciferol, (DRISDOL) 50000 units CAPS capsule Take 1 capsule (50,000 Units total) by mouth every 7 (seven) days. 12 capsule 4  . warfarin (COUMADIN) 1 MG tablet Take 1 tablet (1 mg total) by mouth daily. 90 tablet 3   Current Facility-Administered Medications  Medication Dose Route Frequency Provider Last Rate Last Dose  . 0.9 %  sodium chloride infusion  500 mL Intravenous Continuous Ladene Artist, MD        Allergies  Allergen Reactions  . Iodine Hives  . Iohexol Hives     PT ALLERGIC TO IV CONTRAST;(REACTION DURING CARDIAC CATHETERIZATION); NEEDS PREMEDS PER DR CRENSHAW!, Onset Date: [id number]   . Statins Other (See Comments)    Joint aches with Zocor, Lipitor, Crestor    Past Medical History:  Diagnosis Date  . Anxiety   . Atrial fibrillation ([place])   . CAD (coronary artery disease)    s/p CABG  . Carotid stenosis    mild by doppler  . Complication of anesthesia    zenkers  diverticulum  . Deep vein thrombophlebitis of leg (HCC)   . DJD (degenerative joint disease)   . Elevated TSH    secondary to Amio-normal free T4.   Marland Kitchen  Essential hypertension, benign   . GERD (gastroesophageal reflux disease)   . Hemorrhoids   . Hiatal hernia   . Hyperlipidemia   . PAF (paroxysmal atrial fibrillation) (HCC)    Amiodarone  . PE (pulmonary embolism)    pre 2007. Chronic anticoagulation  . Prostate cancer ([place])   . Sebaceous cyst    neck  . Seborrheic dermatitis   . Tubular adenoma of colon 05/2011    Social History   Socioeconomic History  . Marital status: Married    Spouse name: Buyer, retail  . Number of children: 3  . Years of education: Not on file  . Highest education level: Not on file  Occupational History  . Occupation: retired    Fish farm manager: RETIRED  Social Needs  . Financial resource strain: Not on file  . Food insecurity:    Worry: Not on file    Inability: Not on file  . Transportation  needs:    Medical: Not on file    Non-medical: Not on file  Tobacco Use  . Smoking status: Former Smoker    Packs/day: 1.00    Years: 12.00    Pack years: 12.00    Types: Cigarettes    Last attempt to quit: 07/06/1968    Years since quitting: 49.6  . Smokeless tobacco: Never Used  Substance and Sexual Activity  . Alcohol use: Yes    Comment: social use  . Drug use: No  . Sexual activity: Not on file  Lifestyle  . Physical activity:    Days per week: Not on file    Minutes per session: Not on file  . Stress: Not on file  Relationships  . Social connections:    Talks on phone: Not on file    Gets together: Not on file    Attends religious service: Not on file    Active member of club or organization: Not on file    Attends meetings of clubs or organizations: Not on file    Relationship status: Not on file  . Intimate partner violence:    Fear of current or ex partner: Not on file    Emotionally abused: Not on file    Physically abused: Not on file    Forced sexual activity: Not on file  Other Topics Concern  . Not on file  Social History Narrative  . Not on file     Family History  Problem Relation Age of Onset  . Heart attack Father   . Stroke Mother   . Hypertension Mother   . Hypertension Brother   . Heart disease Brother   . Prostate cancer Brother      Review of Systems: General: negative for chills, fever, night sweats or weight changes.  Cardiovascular: negative for chest pain, dyspnea on exertion, edema, orthopnea, palpitations, paroxysmal nocturnal dyspnea or shortness of breath Dermatological: negative for rash Respiratory: negative for cough or wheezing Urologic: negative for hematuria Abdominal: negative for nausea, vomiting, diarrhea, bright red blood per rectum, melena, or hematemesis Neurologic: negative for visual changes, syncope, or dizziness All other systems reviewed and are otherwise negative except as noted above.    Blood pressure (!)  149/62, pulse 87, height 5\' 9"  (1.753 m), weight 181 lb (82.1 kg).  General appearance: alert, cooperative and no distress Neck: no JVD and Lt CA bruit Lungs: clear to auscultation bilaterally Heart: regular rate and rhythm Extremities: extremities normal, atraumatic, no cyanosis or edema Skin: Skin color, texture, turgor normal. No rashes or lesions  Neurologic: Grossly normal   ASSESSMENT AND PLAN:   Pre op clearance- Urgent retinal detachment surgery scheduled for later this week  Fatigue Better off beta blocker.  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 He has been on anticoagulation since. CHADS VASC-4 for PAF  Elevated TSH secondary to Amio-normal free T4.   Claudication in peripheral vascular disease ([place]) Lt CA bruit  Essential hypertension Controlled    PLAN  Discussed with the pharmacist- OK to hold Coumadin without crossover.  Pt seen and examined today. Based on ACC/AHA guidelines, JAHMEIR GEISEN would be at acceptable risk for the planned procedure without further cardiovascular testing.   Resume Coumadin when able post op.   I will route this recommendation to the requesting party via Epic fax function and remove from pre-op pool.  Please call with questions.   Kerin Ransom PA-C 02/08/2018 10:25 AM

## 2018-02-11 DIAGNOSIS — H33021 Retinal detachment with multiple breaks, right eye: Secondary | ICD-10-CM | POA: Diagnosis not present

## 2018-03-01 ENCOUNTER — Encounter (INDEPENDENT_AMBULATORY_CARE_PROVIDER_SITE_OTHER): Payer: Self-pay

## 2018-03-01 ENCOUNTER — Ambulatory Visit (INDEPENDENT_AMBULATORY_CARE_PROVIDER_SITE_OTHER): Payer: Medicare Other | Admitting: *Deleted

## 2018-03-01 DIAGNOSIS — Z5181 Encounter for therapeutic drug level monitoring: Secondary | ICD-10-CM

## 2018-03-01 DIAGNOSIS — I480 Paroxysmal atrial fibrillation: Secondary | ICD-10-CM | POA: Diagnosis not present

## 2018-03-01 LAB — POCT INR: INR: 3 (ref 2.0–3.0)

## 2018-03-01 NOTE — Patient Instructions (Signed)
Description   Today take 1/2 tablet then continue taking warfarin 1mg  (1 tablet)  daily. Next INR in 4 weeks. Call us with any medication changes or concerns, [phone] [facility].

## 2018-03-04 ENCOUNTER — Ambulatory Visit (HOSPITAL_COMMUNITY)
Admission: RE | Admit: 2018-03-04 | Discharge: 2018-03-04 | Disposition: A | Discharge status: Home / Self Care | Payer: Medicare Other | Source: Ambulatory Visit | Attending: Cardiovascular Disease | Admitting: Cardiovascular Disease

## 2018-03-04 DIAGNOSIS — I6522 Occlusion and stenosis of left carotid artery: Secondary | ICD-10-CM | POA: Diagnosis not present

## 2018-03-04 DIAGNOSIS — R0989 Other specified symptoms and signs involving the circulatory and respiratory systems: Secondary | ICD-10-CM | POA: Insufficient documentation

## 2018-03-08 DIAGNOSIS — H3341 Traction detachment of retina, right eye: Secondary | ICD-10-CM | POA: Diagnosis not present

## 2018-03-14 DIAGNOSIS — H33051 Total retinal detachment, right eye: Secondary | ICD-10-CM | POA: Diagnosis not present

## 2018-03-14 DIAGNOSIS — H3341 Traction detachment of retina, right eye: Secondary | ICD-10-CM | POA: Diagnosis not present

## 2018-03-14 DIAGNOSIS — H3521 Other non-diabetic proliferative retinopathy, right eye: Secondary | ICD-10-CM | POA: Diagnosis not present

## 2018-03-15 DIAGNOSIS — Z23 Encounter for immunization: Secondary | ICD-10-CM | POA: Diagnosis not present

## 2018-03-17 ENCOUNTER — Other Ambulatory Visit: Payer: Self-pay | Admitting: Cardiology

## 2018-03-17 ENCOUNTER — Telehealth: Payer: Self-pay | Admitting: Cardiology

## 2018-03-17 NOTE — Telephone Encounter (Signed)
New message:        *STAT* If patient is at the pharmacy, call can be transferred to refill team.   1. Which medications need to be refilled? (please list name of each medication and dose if known) warfarin (COUMADIN) 1 MG tablet  2. Which pharmacy/location (including street and city if local pharmacy) is medication to be sent to?[id number] - [place], [place] - [address]  3. Do they need a 30 day or 90 day supply? [place]

## 2018-03-18 ENCOUNTER — Ambulatory Visit (INDEPENDENT_AMBULATORY_CARE_PROVIDER_SITE_OTHER): Payer: Medicare Other

## 2018-03-18 DIAGNOSIS — I480 Paroxysmal atrial fibrillation: Secondary | ICD-10-CM

## 2018-03-18 DIAGNOSIS — Z5181 Encounter for therapeutic drug level monitoring: Secondary | ICD-10-CM | POA: Diagnosis not present

## 2018-03-18 LAB — POCT INR: INR: 1.2 — AB (ref 2.0–3.0)

## 2018-03-18 MED ORDER — WARFARIN SODIUM 1 MG PO TABS: 1.0000 mg | ORAL_TABLET | Freq: Every day | ORAL | 0 refills | Status: DC

## 2018-03-18 MED ORDER — WARFARIN SODIUM 1 MG PO TABS: ORAL_TABLET | ORAL | 0 refills | Status: DC

## 2018-03-18 MED ORDER — WARFARIN SODIUM 1 MG PO TABS: ORAL_TABLET | ORAL | 1 refills | Status: DC

## 2018-03-18 NOTE — Patient Instructions (Signed)
Description   Take 1.5 mgs today and tomorrow, then resume same dosage of warfarin 1mg  (1 tablet) daily. Next INR in 10 days. Call us with any medication changes or concerns, [phone] [facility].

## 2018-03-22 DIAGNOSIS — Z09 Encounter for follow-up examination after completed treatment for conditions other than malignant neoplasm: Secondary | ICD-10-CM | POA: Diagnosis not present

## 2018-03-22 DIAGNOSIS — H3341 Traction detachment of retina, right eye: Secondary | ICD-10-CM | POA: Diagnosis not present

## 2018-03-28 ENCOUNTER — Ambulatory Visit (INDEPENDENT_AMBULATORY_CARE_PROVIDER_SITE_OTHER): Payer: Medicare Other | Admitting: *Deleted

## 2018-03-28 DIAGNOSIS — I480 Paroxysmal atrial fibrillation: Secondary | ICD-10-CM | POA: Diagnosis not present

## 2018-03-28 DIAGNOSIS — Z5181 Encounter for therapeutic drug level monitoring: Secondary | ICD-10-CM | POA: Diagnosis not present

## 2018-03-28 LAB — POCT INR: INR: 2.2 (ref 2.0–3.0)

## 2018-03-28 NOTE — Patient Instructions (Signed)
Description   Continue taking warfarin 1mg  (1 tablet) daily. Next INR in 4 weeks per request. Call us with any medication changes or concerns, [phone] [facility].

## 2018-04-19 DIAGNOSIS — H35371 Puckering of macula, right eye: Secondary | ICD-10-CM | POA: Diagnosis not present

## 2018-04-19 DIAGNOSIS — Z09 Encounter for follow-up examination after completed treatment for conditions other than malignant neoplasm: Secondary | ICD-10-CM | POA: Diagnosis not present

## 2018-04-25 ENCOUNTER — Ambulatory Visit (INDEPENDENT_AMBULATORY_CARE_PROVIDER_SITE_OTHER): Payer: Medicare Other | Admitting: *Deleted

## 2018-04-25 DIAGNOSIS — Z5181 Encounter for therapeutic drug level monitoring: Secondary | ICD-10-CM

## 2018-04-25 DIAGNOSIS — I480 Paroxysmal atrial fibrillation: Secondary | ICD-10-CM

## 2018-04-25 LAB — POCT INR: INR: 2.2 (ref 2.0–3.0)

## 2018-04-25 NOTE — Patient Instructions (Signed)
Description   Continue taking warfarin 1mg (1 tablet) daily. Next INR in 6 weeks per request. Call us with any medication changes or concerns, #[phone] [facility].      

## 2018-05-03 NOTE — Progress Notes (Signed)
HPI: FU coronary artery disease status post coronary bypassing graft, as well as mitral valve repair in January 2009. The patient had an Myoview performed on July 12, 2008. This was normal with an ejection fraction of 66%. Pt also with h/o atrial fibrillation and on amiodarone. Echo repeated 10/18 and showed normal LV function; s/p MV repair; mean gradient 5 mmHg and mild MR; moderate LAE; mild RVE.   ABIs July 2019 normal.  Carotid Dopplers August 2019 showed 1 to 39% bilateral stenosis.  Since last seen,there is no dyspnea, chest pain, palpitations or syncope.  No bleeding.  Current Outpatient Medications  Medication Sig Dispense Refill  . amiodarone (PACERONE) 200 MG tablet TAKE 1 TABLET BY MOUTH  DAILY 90 tablet 2  . amLODipine (NORVASC) 5 MG tablet TAKE 1 TABLET BY MOUTH  DAILY 90 tablet 3  . amoxicillin (AMOXIL) 500 MG capsule 2,000 mg. For dental procedures    . Coenzyme Q10 (CO Q-10) 100 MG CAPS Take 200 mg by mouth daily. (Patient taking differently: Take 200 mg by mouth at bedtime. )  0  . doxazosin (CARDURA) 8 MG tablet TAKE 1 TABLET BY MOUTH AT  BEDTIME 90 tablet 3  . ezetimibe (ZETIA) 10 MG tablet Take 1 tablet (10 mg total) by mouth daily. 90 tablet 3  . furosemide (LASIX) 20 MG tablet Take 20 mg by mouth daily as needed.    Marland Kitchen levothyroxine (SYNTHROID, LEVOTHROID) 50 MCG tablet Take 1 tablet (50 mcg total) by mouth daily before breakfast. 90 tablet 3  . losartan (COZAAR) 100 MG tablet TAKE 1 TABLET BY MOUTH  DAILY 90 tablet 3  . pantoprazole (PROTONIX) 40 MG tablet TAKE 1 TABLET BY MOUTH  DAILY BEFORE BREAKFAST 90 tablet 3  . pravastatin (PRAVACHOL) 20 MG tablet TAKE 1 TABLET BY MOUTH  DAILY 90 tablet 3  . ranitidine (ZANTAC) 150 MG tablet Take 1 tablet (150 mg total) by mouth daily before supper. (Patient taking differently: Take 150 mg by mouth daily before supper. As needed) 30 tablet 11  . Vitamin D, Ergocalciferol, (DRISDOL) 50000 units CAPS capsule Take 1 capsule  (50,000 Units total) by mouth every 7 (seven) days. 12 capsule 4  . warfarin (COUMADIN) 1 MG tablet Take as directed by [facility] 90 tablet 1   Current Facility-Administered Medications  Medication Dose Route Frequency Provider Last Rate Last Dose  . 0.9 %  sodium chloride infusion  500 mL Intravenous Continuous Ladene Artist, MD         Past Medical History:  Diagnosis Date  . Anxiety   . Atrial fibrillation ([place])   . CAD (coronary artery disease)    s/p CABG  . Carotid stenosis    mild by doppler  . Complication of anesthesia    zenkers  diverticulum  . Deep vein thrombophlebitis of leg (HCC)   . DJD (degenerative joint disease)   . Elevated TSH    secondary to Amio-normal free T4.   . Essential hypertension, benign   . GERD (gastroesophageal reflux disease)   . Hemorrhoids   . Hiatal hernia   . Hyperlipidemia   . PAF (paroxysmal atrial fibrillation) (HCC)    Amiodarone  . PE (pulmonary embolism)    pre 2007. Chronic anticoagulation  . Prostate cancer ([place])   . Sebaceous cyst    neck  . Seborrheic dermatitis   . Tubular adenoma of colon 05/2011    Past Surgical History:  Procedure Laterality Date  . bilateral  shoulder surgery    . CORONARY ARTERY BYPASS GRAFT     x2    2009  . I&D EXTREMITY  07/09/2011   Procedure: IRRIGATION AND DEBRIDEMENT EXTREMITY;  Surgeon: Tennis Must;  Location: [facility];  Service: Orthopedics;  Laterality: Left;  . left knee arthroscopy    . MITRAL VALVE REPAIR    . prostate cancer surgery  11/1996   radial retropubic prostatectomy  . TANDEM AND OVOID INSERTION    . VASECTOMY    . ZENKER'S DIVERTICULECTOMY N/A 11/07/2012   Procedure: ZENKER'S DIVERTICULECTOMY ENDOSCOPIC;  Surgeon: Melida Quitter, MD;  Location: [facility];  Service: ENT;  Laterality: N/A;    Social History   Socioeconomic History  . Marital status: Married    Spouse name: Buyer, retail  . Number of children: 3  . Years of education: Not on file  .  Highest education level: Not on file  Occupational History  . Occupation: retired    Fish farm manager: RETIRED  Social Needs  . Financial resource strain: Not on file  . Food insecurity:    Worry: Not on file    Inability: Not on file  . Transportation needs:    Medical: Not on file    Non-medical: Not on file  Tobacco Use  . Smoking status: Former Smoker    Packs/day: 1.00    Years: 12.00    Pack years: 12.00    Types: Cigarettes    Last attempt to quit: 07/06/1968    Years since quitting: 49.8  . Smokeless tobacco: Never Used  Substance and Sexual Activity  . Alcohol use: Yes    Comment: social use  . Drug use: No  . Sexual activity: Not on file  Lifestyle  . Physical activity:    Days per week: Not on file    Minutes per session: Not on file  . Stress: Not on file  Relationships  . Social connections:    Talks on phone: Not on file    Gets together: Not on file    Attends religious service: Not on file    Active member of club or organization: Not on file    Attends meetings of clubs or organizations: Not on file    Relationship status: Not on file  . Intimate partner violence:    Fear of current or ex partner: Not on file    Emotionally abused: Not on file    Physically abused: Not on file    Forced sexual activity: Not on file  Other Topics Concern  . Not on file  Social History Narrative  . Not on file    Family History  Problem Relation Age of Onset  . Heart attack Father   . Stroke Mother   . Hypertension Mother   . Hypertension Brother   . Heart disease Brother   . Prostate cancer Brother     ROS: Poor vision in eye from recent retinal detachment but no fevers or chills, productive cough, hemoptysis, dysphasia, odynophagia, melena, hematochezia, dysuria, hematuria, rash, seizure activity, orthopnea, PND, pedal edema, claudication. Remaining systems are negative.  Physical Exam: Well-developed well-nourished in no acute distress.  Skin is warm and dry.    HEENT is normal.  Neck is supple.  Chest is clear to auscultation with normal expansion.  Cardiovascular exam is regular rate and rhythm.  Abdominal exam nontender or distended. No masses palpated. Extremities show no edema. neuro grossly intact   A/P  1 paroxysmal atrial fibrillation-patient remains in  sinus rhythm on exam.  We will continue amiodarone.  Check chest x-ray, TSH, free T4  and liver functions.  Continue Coumadin.  Check hemoglobin.  Metoprolol discontinued previously because of bradycardia and fatigue.  2 hypertension-patient's blood pressure is controlled today.  Continue present medications and follow.  Check potassium and renal function.  3 coronary artery disease-continue statin.  No aspirin given need for Coumadin.  4 prior mitral valve repair-continue SBE prophylaxis.  5 hyperlipidemia-continue statin.  He did not tolerate higher doses of statin previously. Check lipids.   6 carotid artery disease-mild on most recent Dopplers.  [name], MD

## 2018-05-10 DIAGNOSIS — H35371 Puckering of macula, right eye: Secondary | ICD-10-CM | POA: Diagnosis not present

## 2018-05-10 DIAGNOSIS — Z09 Encounter for follow-up examination after completed treatment for conditions other than malignant neoplasm: Secondary | ICD-10-CM | POA: Diagnosis not present

## 2018-05-16 ENCOUNTER — Ambulatory Visit (INDEPENDENT_AMBULATORY_CARE_PROVIDER_SITE_OTHER): Payer: Medicare Other | Admitting: Cardiology

## 2018-05-16 ENCOUNTER — Encounter: Payer: Self-pay | Admitting: Cardiology

## 2018-05-16 VITALS — BP 162/62 | HR 75 | Ht 69.0 in | Wt 187.4 lb

## 2018-05-16 DIAGNOSIS — I2510 Atherosclerotic heart disease of native coronary artery without angina pectoris: Secondary | ICD-10-CM | POA: Diagnosis not present

## 2018-05-16 DIAGNOSIS — I480 Paroxysmal atrial fibrillation: Secondary | ICD-10-CM | POA: Diagnosis not present

## 2018-05-16 DIAGNOSIS — I10 Essential (primary) hypertension: Secondary | ICD-10-CM

## 2018-05-16 DIAGNOSIS — I6522 Occlusion and stenosis of left carotid artery: Secondary | ICD-10-CM | POA: Diagnosis not present

## 2018-05-16 DIAGNOSIS — Z79899 Other long term (current) drug therapy: Secondary | ICD-10-CM | POA: Diagnosis not present

## 2018-05-16 DIAGNOSIS — E7800 Pure hypercholesterolemia, unspecified: Secondary | ICD-10-CM | POA: Diagnosis not present

## 2018-05-16 DIAGNOSIS — Z7901 Long term (current) use of anticoagulants: Secondary | ICD-10-CM | POA: Diagnosis not present

## 2018-05-16 NOTE — Patient Instructions (Signed)
Medication Instructions:  Your physician recommends that you continue on your current medications as directed. Please refer to the Current Medication list given to you today.  If you need a refill on your cardiac medications before your next appointment, please call your pharmacy.   Lab work: Tsh, Free T4, Lft, Lipid, Cbc, Bmet If you have labs (blood work) drawn today and your tests are completely normal, you will receive your results only by: Marland Kitchen MyChart Message (if you have MyChart) OR . A paper copy in the mail If you have any lab test that is abnormal or we need to change your treatment, we will call you to review the results.  Testing/Procedures: A chest x-ray takes a picture of the organs and structures inside the chest, including the heart, lungs, and blood vessels. This test can show several things, including, whether the heart is enlarges; whether fluid is building up in the lungs; and whether pacemaker / defibrillator leads are still in place. (To be done at [facility])  Follow-Up: At [facility], you and your health needs are our priority.  As part of our continuing mission to provide you with exceptional heart care, we have created designated Provider Care Teams.  These Care Teams include your primary Cardiologist (physician) and Advanced Practice Providers (APPs -  Physician Assistants and Nurse Practitioners) who all work together to provide you with the care you need, when you need it. You will need a follow up appointment in 6 months.  Please call our office 2 months in advance to schedule this appointment.  You may see [name], MD or one of the following Advanced Practice Providers on your designated Care Team:   Kerin Ransom, PA-C Roby Lofts, [place] . Sande Rives, PA-C  Any Other Special Instructions Will Be Listed Below (If Applicable).

## 2018-05-17 LAB — CBC WITH DIFFERENTIAL/PLATELET
BASOS ABS: 0 10*3/uL (ref 0.0–0.2)
Basos: 0 %
EOS (ABSOLUTE): 0.1 10*3/uL (ref 0.0–0.4)
Eos: 2 %
HEMOGLOBIN: 13.3 g/dL (ref 13.0–17.7)
Hematocrit: 39.4 % (ref 37.5–51.0)
Immature Grans (Abs): 0 10*3/uL (ref 0.0–0.1)
Immature Granulocytes: 0 %
LYMPHS ABS: 1.1 10*3/uL (ref 0.7–3.1)
Lymphs: 24 %
MCH: 29 pg (ref 26.6–33.0)
MCHC: 33.8 g/dL (ref 31.5–35.7)
MCV: 86 fL (ref 79–97)
MONOCYTES: 11 %
MONOS ABS: 0.5 10*3/uL (ref 0.1–0.9)
Neutrophils Absolute: 3 10*3/uL (ref 1.4–7.0)
Neutrophils: 63 %
PLATELETS: 201 10*3/uL (ref 150–450)
RBC: 4.58 x10E6/uL (ref 4.14–5.80)
RDW: 14.3 % (ref 12.3–15.4)
WBC: 4.7 10*3/uL (ref 3.4–10.8)

## 2018-05-17 LAB — LIPID PANEL
CHOLESTEROL TOTAL: 162 mg/dL (ref 100–199)
Chol/HDL Ratio: 2.2 ratio (ref 0.0–5.0)
HDL: 73 mg/dL (ref >39)
LDL CALC: 78 mg/dL (ref 0–99)
TRIGLYCERIDES: 54 mg/dL (ref 0–149)
VLDL CHOLESTEROL CAL: 11 mg/dL (ref 5–40)

## 2018-05-17 LAB — BASIC METABOLIC PANEL
BUN/Creatinine Ratio: 19 (ref 10–24)
BUN: 27 mg/dL (ref 8–27)
CHLORIDE: 108 mmol/L — AB (ref 96–106)
CO2: 21 mmol/L (ref 20–29)
CREATININE: 1.44 mg/dL — AB (ref 0.76–1.27)
Calcium: 9.1 mg/dL (ref 8.6–10.2)
GFR calc Af Amer: 53 mL/min/{1.73_m2} — ABNORMAL LOW (ref >59)
GFR calc non Af Amer: 46 mL/min/{1.73_m2} — ABNORMAL LOW (ref >59)
GLUCOSE: 99 mg/dL (ref 65–99)
Potassium: 4.6 mmol/L (ref 3.5–5.2)
Sodium: 145 mmol/L — ABNORMAL HIGH (ref 134–144)

## 2018-05-17 LAB — HEPATIC FUNCTION PANEL
ALK PHOS: 135 IU/L — AB (ref 39–117)
ALT: 29 IU/L (ref 0–44)
AST: 31 IU/L (ref 0–40)
Albumin: 4.4 g/dL (ref 3.5–4.7)
BILIRUBIN TOTAL: 0.5 mg/dL (ref 0.0–1.2)
BILIRUBIN, DIRECT: 0.16 mg/dL (ref 0.00–0.40)
Total Protein: 6.4 g/dL (ref 6.0–8.5)

## 2018-05-17 LAB — TSH: TSH: 9.57 u[IU]/mL — ABNORMAL HIGH (ref 0.450–4.500)

## 2018-05-17 LAB — T4, FREE: Free T4: 1.3 ng/dL (ref 0.82–1.77)

## 2018-06-06 ENCOUNTER — Ambulatory Visit (INDEPENDENT_AMBULATORY_CARE_PROVIDER_SITE_OTHER): Payer: Medicare Other | Admitting: Pharmacist

## 2018-06-06 DIAGNOSIS — I480 Paroxysmal atrial fibrillation: Secondary | ICD-10-CM | POA: Diagnosis not present

## 2018-06-06 DIAGNOSIS — Z5181 Encounter for therapeutic drug level monitoring: Secondary | ICD-10-CM

## 2018-06-06 DIAGNOSIS — H6123 Impacted cerumen, bilateral: Secondary | ICD-10-CM | POA: Diagnosis not present

## 2018-06-06 LAB — POCT INR: INR: 2.2 (ref 2.0–3.0)

## 2018-06-06 NOTE — Patient Instructions (Signed)
Description   Continue taking warfarin 1mg  (1 tablet) daily. Next INR in 6 weeks per request. Call us with any medication changes or concerns, [phone] [facility].

## 2018-06-09 ENCOUNTER — Telehealth: Payer: Self-pay | Admitting: Pulmonary Disease

## 2018-06-09 NOTE — Telephone Encounter (Signed)
Rec'd from [facility] forwarded 2 pages to Dr. Teressa Lower.

## 2018-06-15 DIAGNOSIS — H3341 Traction detachment of retina, right eye: Secondary | ICD-10-CM | POA: Diagnosis not present

## 2018-06-15 DIAGNOSIS — H33011 Retinal detachment with single break, right eye: Secondary | ICD-10-CM | POA: Diagnosis not present

## 2018-06-15 DIAGNOSIS — H35371 Puckering of macula, right eye: Secondary | ICD-10-CM | POA: Diagnosis not present

## 2018-06-20 DIAGNOSIS — Z87891 Personal history of nicotine dependence: Secondary | ICD-10-CM | POA: Diagnosis not present

## 2018-06-20 DIAGNOSIS — H7403 Tympanosclerosis, bilateral: Secondary | ICD-10-CM | POA: Diagnosis not present

## 2018-06-20 DIAGNOSIS — J343 Hypertrophy of nasal turbinates: Secondary | ICD-10-CM | POA: Diagnosis not present

## 2018-06-20 DIAGNOSIS — Z974 Presence of external hearing-aid: Secondary | ICD-10-CM | POA: Diagnosis not present

## 2018-06-20 DIAGNOSIS — H938X1 Other specified disorders of right ear: Secondary | ICD-10-CM | POA: Diagnosis not present

## 2018-06-20 DIAGNOSIS — Z9089 Acquired absence of other organs: Secondary | ICD-10-CM | POA: Diagnosis not present

## 2018-06-20 DIAGNOSIS — H906 Mixed conductive and sensorineural hearing loss, bilateral: Secondary | ICD-10-CM | POA: Diagnosis not present

## 2018-06-20 DIAGNOSIS — H6122 Impacted cerumen, left ear: Secondary | ICD-10-CM | POA: Diagnosis not present

## 2018-07-08 DIAGNOSIS — H35371 Puckering of macula, right eye: Secondary | ICD-10-CM | POA: Diagnosis not present

## 2018-07-08 DIAGNOSIS — H35351 Cystoid macular degeneration, right eye: Secondary | ICD-10-CM | POA: Diagnosis not present

## 2018-07-18 ENCOUNTER — Ambulatory Visit (INDEPENDENT_AMBULATORY_CARE_PROVIDER_SITE_OTHER): Payer: Medicare Other

## 2018-07-18 DIAGNOSIS — Z5181 Encounter for therapeutic drug level monitoring: Secondary | ICD-10-CM | POA: Diagnosis not present

## 2018-07-18 DIAGNOSIS — I480 Paroxysmal atrial fibrillation: Secondary | ICD-10-CM

## 2018-07-18 LAB — POCT INR: INR: 2.1 (ref 2.0–3.0)

## 2018-07-18 NOTE — Patient Instructions (Signed)
Description   Continue taking warfarin 1mg  (1 tablet) daily. Next INR in 6 weeks per request. Call us with any medication changes or concerns, [phone] [facility].

## 2018-08-11 DIAGNOSIS — D1801 Hemangioma of skin and subcutaneous tissue: Secondary | ICD-10-CM | POA: Diagnosis not present

## 2018-08-11 DIAGNOSIS — Z85828 Personal history of other malignant neoplasm of skin: Secondary | ICD-10-CM | POA: Diagnosis not present

## 2018-08-11 DIAGNOSIS — L570 Actinic keratosis: Secondary | ICD-10-CM | POA: Diagnosis not present

## 2018-08-11 DIAGNOSIS — L718 Other rosacea: Secondary | ICD-10-CM | POA: Diagnosis not present

## 2018-08-11 DIAGNOSIS — L821 Other seborrheic keratosis: Secondary | ICD-10-CM | POA: Diagnosis not present

## 2018-08-11 DIAGNOSIS — D225 Melanocytic nevi of trunk: Secondary | ICD-10-CM | POA: Diagnosis not present

## 2018-08-11 DIAGNOSIS — D692 Other nonthrombocytopenic purpura: Secondary | ICD-10-CM | POA: Diagnosis not present

## 2018-08-16 DIAGNOSIS — H35351 Cystoid macular degeneration, right eye: Secondary | ICD-10-CM | POA: Diagnosis not present

## 2018-08-16 DIAGNOSIS — Z09 Encounter for follow-up examination after completed treatment for conditions other than malignant neoplasm: Secondary | ICD-10-CM | POA: Diagnosis not present

## 2018-08-22 ENCOUNTER — Other Ambulatory Visit: Payer: Self-pay | Admitting: Cardiology

## 2018-08-22 ENCOUNTER — Other Ambulatory Visit: Payer: Self-pay | Admitting: Physician Assistant

## 2018-08-22 ENCOUNTER — Other Ambulatory Visit: Payer: Self-pay | Admitting: Pulmonary Disease

## 2018-08-22 DIAGNOSIS — I4891 Unspecified atrial fibrillation: Secondary | ICD-10-CM

## 2018-08-22 DIAGNOSIS — R7989 Other specified abnormal findings of blood chemistry: Secondary | ICD-10-CM

## 2018-08-29 ENCOUNTER — Encounter (INDEPENDENT_AMBULATORY_CARE_PROVIDER_SITE_OTHER): Payer: Self-pay

## 2018-08-29 ENCOUNTER — Ambulatory Visit (INDEPENDENT_AMBULATORY_CARE_PROVIDER_SITE_OTHER): Payer: Medicare Other

## 2018-08-29 DIAGNOSIS — Z5181 Encounter for therapeutic drug level monitoring: Secondary | ICD-10-CM

## 2018-08-29 DIAGNOSIS — I480 Paroxysmal atrial fibrillation: Secondary | ICD-10-CM

## 2018-08-29 LAB — POCT INR: INR: 2.2 (ref 2.0–3.0)

## 2018-08-29 NOTE — Patient Instructions (Signed)
Description   Continue taking warfarin 1mg  (1 tablet) daily. Next INR in 6 weeks per request. Call us with any medication changes or concerns, [phone] [facility].

## 2018-09-14 ENCOUNTER — Other Ambulatory Visit: Payer: Self-pay | Admitting: Cardiology

## 2018-10-17 ENCOUNTER — Other Ambulatory Visit: Payer: Self-pay | Admitting: Gastroenterology

## 2018-10-17 DIAGNOSIS — H35351 Cystoid macular degeneration, right eye: Secondary | ICD-10-CM | POA: Diagnosis not present

## 2018-10-17 DIAGNOSIS — H33011 Retinal detachment with single break, right eye: Secondary | ICD-10-CM | POA: Diagnosis not present

## 2018-10-17 DIAGNOSIS — H532 Diplopia: Secondary | ICD-10-CM | POA: Diagnosis not present

## 2018-10-17 DIAGNOSIS — H35371 Puckering of macula, right eye: Secondary | ICD-10-CM | POA: Diagnosis not present

## 2018-10-18 ENCOUNTER — Telehealth: Payer: Self-pay

## 2018-10-18 NOTE — Telephone Encounter (Signed)
novoicemail set up to [employer] for [employer] drive thru

## 2018-10-19 ENCOUNTER — Ambulatory Visit (INDEPENDENT_AMBULATORY_CARE_PROVIDER_SITE_OTHER): Payer: Medicare Other | Admitting: *Deleted

## 2018-10-19 ENCOUNTER — Other Ambulatory Visit: Payer: Self-pay

## 2018-10-19 DIAGNOSIS — Z5181 Encounter for therapeutic drug level monitoring: Secondary | ICD-10-CM

## 2018-10-19 DIAGNOSIS — I480 Paroxysmal atrial fibrillation: Secondary | ICD-10-CM | POA: Diagnosis not present

## 2018-10-19 LAB — POCT INR: INR: 2.6 (ref 2.0–3.0)

## 2018-10-19 NOTE — Patient Instructions (Signed)
Description   Spoke with pt and instructed to continue taking warfarin 1mg  (1 tablet) daily. Next INR in 8 weeks.. Call us with any medication changes or concerns, [phone] [facility].

## 2018-11-02 ENCOUNTER — Other Ambulatory Visit: Payer: Self-pay | Admitting: Gastroenterology

## 2018-11-09 NOTE — Progress Notes (Signed)
Virtual Visit via Video Note changed to phone visit as no smart phone   This visit type was conducted due to national recommendations for restrictions regarding the COVID-19 Pandemic (e.g. social distancing) in an effort to limit this patient's exposure and mitigate transmission in our community.  Due to his co-morbid illnesses, this patient is at least at moderate risk for complications without adequate follow up.  This format is felt to be most appropriate for this patient at this time.  All issues noted in this document were discussed and addressed.  A limited physical exam was performed with this format.  Please refer to the patient's chart for his consent to telehealth for [facility].   Date:  11/10/2018   ID:  John [name], DOB 20-Sep-1935, MRN [id number]  Patient Location: Home Provider Location: Home  PCP:  Noralee Space, MD  Cardiologist:  [name], MD   Evaluation Performed:  Follow-Up Visit  Chief Complaint:  FU atrial fibrillation  History of Present Illness:    FU coronary artery disease status post coronary bypassing graft, as well as mitral valve repair in January 2009. The patient had an Myoview performed on July 12, 2008. This was normal with an ejection fraction of 66%. Pt also with h/o atrial fibrillationand on amiodarone. Echo repeated 10/18 and showed normal LV function; s/p MV repair; mean gradient 5 mmHg and mild MR; moderate LAE; mild RVE.  ABIs July 2019 normal.  Carotid Dopplers August 2019 showed 1 to 39% bilateral stenosis.  Since last seen,patient denies dyspnea, chest pain, palpitations or syncope.  The patient does not have symptoms concerning for COVID-19 infection (fever, chills, cough, or new shortness of breath).    Past Medical History:  Diagnosis Date  . Anxiety   . Atrial fibrillation ([place])   . CAD (coronary artery disease)    s/p CABG  . Carotid stenosis    mild by doppler  . Complication of anesthesia    zenkers  diverticulum   . Deep vein thrombophlebitis of leg (HCC)   . DJD (degenerative joint disease)   . Elevated TSH    secondary to Amio-normal free T4.   . Essential hypertension, benign   . GERD (gastroesophageal reflux disease)   . Hemorrhoids   . Hiatal hernia   . Hyperlipidemia   . PAF (paroxysmal atrial fibrillation) (HCC)    Amiodarone  . PE (pulmonary embolism)    pre 2007. Chronic anticoagulation  . Prostate cancer ([place])   . Sebaceous cyst    neck  . Seborrheic dermatitis   . Tubular adenoma of colon 05/2011   Past Surgical History:  Procedure Laterality Date  . bilateral shoulder surgery    . CORONARY ARTERY BYPASS GRAFT     x2    2009  . I&D EXTREMITY  07/09/2011   Procedure: IRRIGATION AND DEBRIDEMENT EXTREMITY;  Surgeon: Tennis Must;  Location: [place];  Service: Orthopedics;  Laterality: Left;  . left knee arthroscopy    . MITRAL VALVE REPAIR    . prostate cancer surgery  11/1996   radial retropubic prostatectomy  . TANDEM AND OVOID INSERTION    . VASECTOMY    . ZENKER'S DIVERTICULECTOMY N/A 11/07/2012   Procedure: ZENKER'S DIVERTICULECTOMY ENDOSCOPIC;  Surgeon: Melida Quitter, MD;  Location: [facility];  Service: ENT;  Laterality: N/A;     Current Meds  Medication Sig  . amiodarone (PACERONE) 200 MG tablet TAKE 1 TABLET BY MOUTH  DAILY  . amLODipine (NORVASC)  5 MG tablet TAKE 1 TABLET BY MOUTH  DAILY  . Coenzyme Q10 (CO Q-10) 100 MG CAPS Take 200 mg by mouth daily. (Patient taking differently: Take 200 mg by mouth at bedtime. )  . doxazosin (CARDURA) 8 MG tablet TAKE 1 TABLET BY MOUTH AT  BEDTIME  . ezetimibe (ZETIA) 10 MG tablet TAKE 1 TABLET BY MOUTH  DAILY  . furosemide (LASIX) 20 MG tablet Take 20 mg by mouth daily as needed.  Marland Kitchen levothyroxine (SYNTHROID, LEVOTHROID) 50 MCG tablet TAKE 1 TABLET BY MOUTH  DAILY BEFORE BREAKFAST  . losartan (COZAAR) 100 MG tablet TAKE 1 TABLET BY MOUTH  DAILY  . pantoprazole (PROTONIX) 40 MG tablet Take 1 tablet (40 mg total) by  mouth daily before breakfast. MUST HAVE OFFICE VISIT FOR FURTHER REFILLS  . pravastatin (PRAVACHOL) 20 MG tablet TAKE 1 TABLET BY MOUTH  DAILY  . ranitidine (ZANTAC) 150 MG tablet Take 1 tablet (150 mg total) by mouth daily before supper. (Patient taking differently: Take 150 mg by mouth daily before supper. As needed)  . Vitamin D, Ergocalciferol, (DRISDOL) 50000 units CAPS capsule Take 1 capsule (50,000 Units total) by mouth every 7 (seven) days.  Marland Kitchen warfarin (COUMADIN) 1 MG tablet TAKE AS DIRECTED BY  COUMADIN CLINIC  . [DISCONTINUED] amoxicillin (AMOXIL) 500 MG capsule 2,000 mg. For dental procedures   Current Facility-Administered Medications for the 11/10/18 encounter (Telemedicine) with Lelon Perla, MD  Medication  . 0.9 %  sodium chloride infusion     Allergies:   Iodine; Iohexol; and Statins   Social History   Tobacco Use  . Smoking status: Former Smoker    Packs/day: 1.00    Years: 12.00    Pack years: 12.00    Types: Cigarettes    Last attempt to quit: 07/06/1968    Years since quitting: 50.3  . Smokeless tobacco: Never Used  Substance Use Topics  . Alcohol use: Yes    Comment: social use  . Drug use: No     Family Hx: The patient's family history includes Heart attack in his father; Heart disease in his brother; Hypertension in his brother and mother; Prostate cancer in his brother; Stroke in his mother.  ROS:   Please see the history of present illness.    No fevers, chills or productive cough.  Problems with vision because of macular degeneration.  This also causes problems with balance. All other systems reviewed and are negative.  Recent Labs: 05/16/2018: ALT 29; BUN 27; Creatinine, Ser 1.44; Hemoglobin 13.3; Platelets 201; Potassium 4.6; Sodium 145; TSH 9.570   Recent Lipid Panel Lab Results  Component Value Date/Time   CHOL 162 05/16/2018 08:27 AM   TRIG 54 05/16/2018 08:27 AM   HDL 73 05/16/2018 08:27 AM   CHOLHDL 2.2 05/16/2018 08:27 AM   CHOLHDL  3 09/09/2017 09:47 AM   LDLCALC 78 05/16/2018 08:27 AM   LDLDIRECT 155.5 03/11/2011 08:28 AM    Wt Readings from Last 3 Encounters:  11/10/18 175 lb (79.4 kg)  05/16/18 187 lb 6.4 oz (85 kg)  02/08/18 181 lb (82.1 kg)     Objective:    Vital Signs:  BP (!) 141/64   Pulse 73   Ht 5\' 9"  (1.753 m)   Wt 175 lb (79.4 kg)   BMI 25.84 kg/m    VITAL SIGNS:  reviewed  No acute distress Normal affect Answers questions appropriately Remainder of physical examination not performed (telehealth visit; coronavirus pandemic)  ASSESSMENT & PLAN:  1. Paroxysmal atrial fibrillation-by history patient remains in sinus rhythm.  We will continue amiodarone at present dose.  Check chest x-ray, TSH and liver functions.  Continue Coumadin.  Check hemoglobin.  Note metoprolol discontinued previously because of bradycardia and fatigue. 2. Hypertension-patient's blood pressure is controlled.  Continue present medications and follow.  Check potassium and renal function. 3. Coronary artery disease-patient is not on aspirin given need for Coumadin.  Continue statin. 4. Hyperlipidemia-continue statin.  Note he did not tolerate higher doses in the past. 5. Prior mitral valve repair-continue SBE prophylaxis. 6. Carotid artery disease-mild on most recent Dopplers.  COVID-19 Education: The importance of social distancing was discussed today.  Time:   Today, I have spent 15 minutes with the patient with telehealth technology discussing the above problems.     Medication Adjustments/Labs and Tests Ordered: Current medicines are reviewed at length with the patient today.  Concerns regarding medicines are outlined above.   Tests Ordered: No orders of the defined types were placed in this encounter.   Medication Changes: No orders of the defined types were placed in this encounter.   Disposition:  Follow up in 6 month(s)  Signed, [name], MD  11/10/2018 11:02 AM    [place]

## 2018-11-10 ENCOUNTER — Telehealth: Payer: Self-pay | Admitting: *Deleted

## 2018-11-10 ENCOUNTER — Telehealth (INDEPENDENT_AMBULATORY_CARE_PROVIDER_SITE_OTHER): Payer: Medicare Other | Admitting: Cardiology

## 2018-11-10 VITALS — BP 141/64 | HR 73 | Ht 69.0 in | Wt 175.0 lb

## 2018-11-10 DIAGNOSIS — I480 Paroxysmal atrial fibrillation: Secondary | ICD-10-CM

## 2018-11-10 DIAGNOSIS — E7800 Pure hypercholesterolemia, unspecified: Secondary | ICD-10-CM

## 2018-11-10 DIAGNOSIS — I10 Essential (primary) hypertension: Secondary | ICD-10-CM

## 2018-11-10 DIAGNOSIS — I059 Rheumatic mitral valve disease, unspecified: Secondary | ICD-10-CM

## 2018-11-10 NOTE — Patient Instructions (Signed)
Medication Instructions:  NO CHANGE If you need a refill on your cardiac medications before your next appointment, please call your pharmacy.   Lab work: Your physician recommends that you return for lab work in: 4-6 WEEKS If you have labs (blood work) drawn today and your tests are completely normal, you will receive your results only by: Marland Kitchen MyChart Message (if you have MyChart) OR . A paper copy in the mail If you have any lab test that is abnormal or we need to change your treatment, we will call you to review the results.  Testing/Procedures: A chest x-ray takes a picture of the organs and structures inside the chest, including the heart, lungs, and blood vessels. This test can show several things, including, whether the heart is enlarges; whether fluid is building up in the lungs; and whether pacemaker / defibrillator leads are still in place.  [address] Follow-Up: At [facility], you and your health needs are our priority.  As part of our continuing mission to provide you with exceptional heart care, we have created designated Provider Care Teams.  These Care Teams include your primary Cardiologist (physician) and Advanced Practice Providers (APPs -  Physician Assistants and Nurse Practitioners) who all work together to provide you with the care you need, when you need it. You will need a follow up appointment in 6 months.  Please call our office 2 months in advance to schedule this appointment.  You may see [name], MD or one of the following Advanced Practice Providers on your designated Care Team:   Kerin Ransom, PA-C Roby Lofts, [place] . Sande Rives, PA-C

## 2018-11-10 NOTE — Telephone Encounter (Signed)
Virtual Visit Pre-Appointment Phone Call  "(Name), I am calling you today to discuss your upcoming appointment. We are currently trying to limit exposure to the virus that causes COVID-19 by seeing patients at home rather than in the office."  1. "What is the BEST phone number to call the day of the visit?" - include this in appointment notes  2. Do you have or have access to (through a family member/friend) a smartphone with video capability that we can use for your visit?" a. If yes - list this number in appt notes as cell (if different from BEST phone #) and list the appointment type as a VIDEO visit in appointment notes b. If no - list the appointment type as a PHONE visit in appointment notes  3. Confirm consent - "In the setting of the current Covid19 crisis, you are scheduled for a (phone or video) visit with your provider on (date) at (time).  Just as we do with many in-office visits, in order for you to participate in this visit, we must obtain consent.  If you'd like, I can send this to your mychart (if signed up) or email for you to review.  Otherwise, I can obtain your verbal consent now.  All virtual visits are billed to your insurance company just like a normal visit would be.  By agreeing to a virtual visit, we'd like you to understand that the technology does not allow for your provider to perform an examination, and thus may limit your provider's ability to fully assess your condition. If your provider identifies any concerns that need to be evaluated in person, we will make arrangements to do so.  Finally, though the technology is pretty good, we cannot assure that it will always work on either your or our end, and in the setting of a video visit, we may have to convert it to a phone-only visit.  In either situation, we cannot ensure that we have a secure connection.  Are you willing to proceed?" STAFF: Did the patient verbally acknowledge consent to telehealth visit? Document  YES/NO here: YES  4. Advise patient to be prepared - "Two hours prior to your appointment, go ahead and check your blood pressure, pulse, oxygen saturation, and your weight (if you have the equipment to check those) and write them all down. When your visit starts, your provider will ask you for this information. If you have an Apple Watch or Kardia device, please plan to have heart rate information ready on the day of your appointment. Please have a pen and paper handy nearby the day of the visit as well."  5. Give patient instructions for MyChart download to smartphone OR Doximity/Doxy.me as below if video visit (depending on what platform provider is using)  6. Inform patient they will receive a phone call 15 minutes prior to their appointment time (may be from unknown caller ID) so they should be prepared to answer    TELEPHONE CALL NOTE  John [name] has been deemed a candidate for a follow-up tele-health visit to limit community exposure during the Covid-19 pandemic. I spoke with the patient via phone to ensure availability of phone/video source, confirm preferred email & phone number, and discuss instructions and expectations.  I reminded John [name] to be prepared with any vital sign and/or heart rhythm information that could potentially be obtained via home monitoring, at the time of his visit. I reminded John [name] to expect a phone call prior to  his visit.  Venetia Maxon, CMA 11/10/2018 10:15 AM   INSTRUCTIONS FOR DOWNLOADING THE MYCHART APP TO SMARTPHONE  - The patient must first make sure to have activated MyChart and know their login information - If Apple, go to [employer] and type in MyChart in the search bar and download the app. If Android, ask patient to go to [employer] and type in [place] in the search bar and download the app. The app is free but as with any other app downloads, their phone may require them to verify saved payment information or Apple/Android  password.  - The patient will need to then log into the app with their MyChart username and password, and select [facility] as their healthcare provider to link the account. When it is time for your visit, go to the MyChart app, find appointments, and click Begin Video Visit. Be sure to Select Allow for your device to access the Microphone and Camera for your visit. You will then be connected, and your provider will be with you shortly.  **If they have any issues connecting, or need assistance please contact MyChart service desk [phone])**  **If using a computer, in order to ensure the best quality for their visit they will need to use either of the following Internet Browsers: [employer], or Google Chrome**  IF USING DOXIMITY or DOXY.ME - The patient will receive a link just prior to their visit by text.     FULL LENGTH CONSENT FOR TELE-HEALTH VISIT   I hereby voluntarily request, consent and authorize [facility] and its employed or contracted physicians, physician assistants, nurse practitioners or other licensed health care professionals (the Practitioner), to provide me with telemedicine health care services (the Services") as deemed necessary by the treating Practitioner. I acknowledge and consent to receive the Services by the Practitioner via telemedicine. I understand that the telemedicine visit will involve communicating with the Practitioner through live audiovisual communication technology and the disclosure of certain medical information by electronic transmission. I acknowledge that I have been given the opportunity to request an in-person assessment or other available alternative prior to the telemedicine visit and am voluntarily participating in the telemedicine visit.  I understand that I have the right to withhold or withdraw my consent to the use of telemedicine in the course of my care at any time, without affecting my right to future care or treatment,  and that the Practitioner or I may terminate the telemedicine visit at any time. I understand that I have the right to inspect all information obtained and/or recorded in the course of the telemedicine visit and may receive copies of available information for a reasonable fee.  I understand that some of the potential risks of receiving the Services via telemedicine include:   Delay or interruption in medical evaluation due to technological equipment failure or disruption;  Information transmitted may not be sufficient (e.g. poor resolution of images) to allow for appropriate medical decision making by the Practitioner; and/or   In rare instances, security protocols could fail, causing a breach of personal health information.  Furthermore, I acknowledge that it is my responsibility to provide information about my medical history, conditions and care that is complete and accurate to the best of my ability. I acknowledge that Practitioner's advice, recommendations, and/or decision may be based on factors not within their control, such as incomplete or inaccurate data provided by me or distortions of diagnostic images or specimens that may result from electronic transmissions.  I understand that the practice of medicine is not an exact science and that Practitioner makes no warranties or guarantees regarding treatment outcomes. I acknowledge that I will receive a copy of this consent concurrently upon execution via email to the email address I last provided but may also request a printed copy by calling the office of [facility].    I understand that my insurance will be billed for this visit.   I have read or had this consent read to me.  I understand the contents of this consent, which adequately explains the benefits and risks of the Services being provided via telemedicine.   I have been provided ample opportunity to ask questions regarding this consent and the Services and have had my questions  answered to my satisfaction.  I give my informed consent for the services to be provided through the use of telemedicine in my medical care  By participating in this telemedicine visit I agree to the above.        Cardiac Questionnaire:    Since your last visit or hospitalization:    1. Have you been having new or worsening chest pain? YES   2. Have you been having new or worsening shortness of breath? YES 3. Have you been having new or worsening leg swelling, wt gain, or increase in abdominal girth (pants fitting more tightly)? NO   4. Have you had any passing out spells? NO    *A YES to any of these questions would result in the appointment being kept. *If all the answers to these questions are NO, we should indicate that given the current situation regarding the worldwide coronarvirus pandemic, at the recommendation of the CDC, we are looking to limit gatherings in our waiting area, and thus will reschedule their appointment beyond four weeks from today.   _____________   COVID-19 Pre-Screening Questions:   Do you currently have a fever? NO  Have you recently travelled on a cruise, internationally, or to [place], [place], [place], [place], [place], or [place], [place] [employer])? NO  Have you been in contact with someone that is currently pending confirmation of Covid19 testing or has been confirmed to have the [place] virus?  NO  Are you currently experiencing fatigue or cough? NO

## 2018-11-15 ENCOUNTER — Other Ambulatory Visit: Payer: Self-pay | Admitting: Pulmonary Disease

## 2018-11-17 ENCOUNTER — Telehealth: Payer: Medicare Other | Admitting: Cardiology

## 2018-12-07 ENCOUNTER — Telehealth: Payer: Self-pay

## 2018-12-07 NOTE — Telephone Encounter (Signed)

## 2018-12-14 ENCOUNTER — Ambulatory Visit
Admission: RE | Admit: 2018-12-14 | Discharge: 2018-12-14 | Disposition: A | Discharge status: Home / Self Care | Payer: Medicare Other | Source: Ambulatory Visit | Attending: Cardiology | Admitting: Cardiology

## 2018-12-14 ENCOUNTER — Ambulatory Visit (INDEPENDENT_AMBULATORY_CARE_PROVIDER_SITE_OTHER): Payer: Medicare Other | Admitting: Pharmacist Clinician (PhC)/ Clinical Pharmacy Specialist

## 2018-12-14 ENCOUNTER — Other Ambulatory Visit: Payer: Self-pay

## 2018-12-14 DIAGNOSIS — Z5181 Encounter for therapeutic drug level monitoring: Secondary | ICD-10-CM | POA: Diagnosis not present

## 2018-12-14 DIAGNOSIS — I480 Paroxysmal atrial fibrillation: Secondary | ICD-10-CM | POA: Diagnosis not present

## 2018-12-14 LAB — COMPREHENSIVE METABOLIC PANEL
ALT: 24 IU/L (ref 0–44)
AST: 27 IU/L (ref 0–40)
Albumin/Globulin Ratio: 2 (ref 1.2–2.2)
Albumin: 4.3 g/dL (ref 3.7–4.7)
Alkaline Phosphatase: 133 IU/L — ABNORMAL HIGH (ref 39–117)
BUN/Creatinine Ratio: 17 (ref 10–24)
BUN: 24 mg/dL (ref 8–27)
Bilirubin Total: 0.5 mg/dL (ref 0.0–1.2)
CO2: 21 mmol/L (ref 20–29)
Calcium: 9.1 mg/dL (ref 8.6–10.2)
Chloride: 109 mmol/L — ABNORMAL HIGH (ref 96–106)
Creatinine, Ser: 1.42 mg/dL — ABNORMAL HIGH (ref 0.76–1.27)
GFR calc Af Amer: 54 mL/min/{1.73_m2} — ABNORMAL LOW (ref >59)
GFR calc non Af Amer: 46 mL/min/{1.73_m2} — ABNORMAL LOW (ref >59)
Globulin, Total: 2.1 g/dL (ref 1.5–4.5)
Glucose: 94 mg/dL (ref 65–99)
Potassium: 4.7 mmol/L (ref 3.5–5.2)
Sodium: 143 mmol/L (ref 134–144)
Total Protein: 6.4 g/dL (ref 6.0–8.5)

## 2018-12-14 LAB — CBC
Hematocrit: 41.1 % (ref 37.5–51.0)
Hemoglobin: 13.4 g/dL (ref 13.0–17.7)
MCH: 28.8 pg (ref 26.6–33.0)
MCHC: 32.6 g/dL (ref 31.5–35.7)
MCV: 88 fL (ref 79–97)
Platelets: 199 10*3/uL (ref 150–450)
RBC: 4.66 x10E6/uL (ref 4.14–5.80)
RDW: 14.3 % (ref 11.6–15.4)
WBC: 5.7 10*3/uL (ref 3.4–10.8)

## 2018-12-14 LAB — POCT INR: INR: 2.3 (ref 2.0–3.0)

## 2018-12-14 LAB — TSH: TSH: 7.45 u[IU]/mL — ABNORMAL HIGH (ref 0.450–4.500)

## 2018-12-26 DIAGNOSIS — H5213 Myopia, bilateral: Secondary | ICD-10-CM | POA: Diagnosis not present

## 2018-12-26 DIAGNOSIS — H532 Diplopia: Secondary | ICD-10-CM | POA: Diagnosis not present

## 2018-12-26 DIAGNOSIS — H35351 Cystoid macular degeneration, right eye: Secondary | ICD-10-CM | POA: Diagnosis not present

## 2018-12-26 DIAGNOSIS — Z961 Presence of intraocular lens: Secondary | ICD-10-CM | POA: Diagnosis not present

## 2019-01-09 ENCOUNTER — Other Ambulatory Visit: Payer: Self-pay | Admitting: Cardiology

## 2019-01-09 DIAGNOSIS — I4891 Unspecified atrial fibrillation: Secondary | ICD-10-CM

## 2019-01-16 DIAGNOSIS — H33011 Retinal detachment with single break, right eye: Secondary | ICD-10-CM | POA: Diagnosis not present

## 2019-01-16 DIAGNOSIS — H35371 Puckering of macula, right eye: Secondary | ICD-10-CM | POA: Diagnosis not present

## 2019-01-16 DIAGNOSIS — H35351 Cystoid macular degeneration, right eye: Secondary | ICD-10-CM | POA: Diagnosis not present

## 2019-01-16 DIAGNOSIS — H532 Diplopia: Secondary | ICD-10-CM | POA: Diagnosis not present

## 2019-01-17 DIAGNOSIS — H532 Diplopia: Secondary | ICD-10-CM | POA: Diagnosis not present

## 2019-01-17 DIAGNOSIS — H5010 Unspecified exotropia: Secondary | ICD-10-CM | POA: Diagnosis not present

## 2019-02-01 ENCOUNTER — Other Ambulatory Visit: Payer: Self-pay | Admitting: Cardiology

## 2019-02-01 DIAGNOSIS — I10 Essential (primary) hypertension: Secondary | ICD-10-CM

## 2019-02-07 ENCOUNTER — Other Ambulatory Visit: Payer: Self-pay

## 2019-02-07 ENCOUNTER — Ambulatory Visit (INDEPENDENT_AMBULATORY_CARE_PROVIDER_SITE_OTHER): Payer: Medicare Other | Admitting: Pharmacist Clinician (PhC)/ Clinical Pharmacy Specialist

## 2019-02-07 DIAGNOSIS — I480 Paroxysmal atrial fibrillation: Secondary | ICD-10-CM

## 2019-02-07 DIAGNOSIS — Z5181 Encounter for therapeutic drug level monitoring: Secondary | ICD-10-CM | POA: Diagnosis not present

## 2019-02-07 LAB — POCT INR: INR: 2.8 (ref 2.0–3.0)

## 2019-04-03 ENCOUNTER — Ambulatory Visit (INDEPENDENT_AMBULATORY_CARE_PROVIDER_SITE_OTHER): Payer: Medicare Other | Admitting: Pharmacist Clinician (PhC)/ Clinical Pharmacy Specialist

## 2019-04-03 ENCOUNTER — Other Ambulatory Visit: Payer: Self-pay

## 2019-04-03 DIAGNOSIS — Z5181 Encounter for therapeutic drug level monitoring: Secondary | ICD-10-CM | POA: Diagnosis not present

## 2019-04-03 DIAGNOSIS — I480 Paroxysmal atrial fibrillation: Secondary | ICD-10-CM

## 2019-04-03 LAB — POCT INR: INR: 2.1 (ref 2.0–3.0)

## 2019-04-14 ENCOUNTER — Other Ambulatory Visit: Payer: Self-pay | Admitting: Cardiology

## 2019-04-14 NOTE — Telephone Encounter (Signed)
Refill request

## 2019-04-19 ENCOUNTER — Telehealth: Payer: Self-pay | Admitting: Cardiology

## 2019-04-19 MED ORDER — WARFARIN SODIUM 1 MG PO TABS: ORAL_TABLET | ORAL | 0 refills | Status: DC

## 2019-04-19 NOTE — Telephone Encounter (Signed)
New message      *STAT* If patient is at the pharmacy, call can be transferred to refill team.   1. Which medications need to be refilled? (please list name of each medication and dose if known) warfarin 1mg   2. Which pharmacy/location (including street and city if local pharmacy) is medication to be sent to? [facility] [address]  3. Do they need a 30 day or 90 day supply?  Need 7 day supply until mailorder presc arrives---pt will be out today

## 2019-04-24 DIAGNOSIS — H532 Diplopia: Secondary | ICD-10-CM | POA: Diagnosis not present

## 2019-04-24 DIAGNOSIS — H33051 Total retinal detachment, right eye: Secondary | ICD-10-CM | POA: Diagnosis not present

## 2019-04-24 DIAGNOSIS — H35351 Cystoid macular degeneration, right eye: Secondary | ICD-10-CM | POA: Diagnosis not present

## 2019-04-24 DIAGNOSIS — H33011 Retinal detachment with single break, right eye: Secondary | ICD-10-CM | POA: Diagnosis not present

## 2019-05-04 DIAGNOSIS — Z23 Encounter for immunization: Secondary | ICD-10-CM | POA: Diagnosis not present

## 2019-05-29 ENCOUNTER — Other Ambulatory Visit: Payer: Self-pay

## 2019-05-29 ENCOUNTER — Ambulatory Visit (INDEPENDENT_AMBULATORY_CARE_PROVIDER_SITE_OTHER): Payer: Medicare Other | Admitting: Pharmacist

## 2019-05-29 DIAGNOSIS — Z5181 Encounter for therapeutic drug level monitoring: Secondary | ICD-10-CM | POA: Diagnosis not present

## 2019-05-29 DIAGNOSIS — I480 Paroxysmal atrial fibrillation: Secondary | ICD-10-CM

## 2019-05-29 LAB — POCT INR: INR: 2.3 (ref 2.0–3.0)

## 2019-06-07 NOTE — Progress Notes (Signed)
HPI: FU coronary artery disease status post coronary bypassing graft, as well as mitral valve repair in January 2009. The patient had an Myoview performed on July 12, 2008. This was normal with an ejection fraction of 66%. Pt also with h/o atrial fibrillationand on amiodarone. Echo repeated 10/18 and showed normal LV function; s/p MV repair; mean gradient 5 mmHg and mild MR; moderate LAE; mild RVE.ABIs July 2019 normal. Carotid Dopplers August 2019 showed 1 to 39% bilateral stenosis. Since last seen,the patient has dyspnea with more extreme activities but not with routine activities. It is relieved with rest. It is not associated with chest pain. There is no orthopnea, PND or pedal edema. There is no syncope or palpitations. There is no exertional chest pain.   Current Outpatient Medications  Medication Sig Dispense Refill  . amiodarone (PACERONE) 200 MG tablet TAKE 1 TABLET BY MOUTH  DAILY 90 tablet 1  . amLODipine (NORVASC) 5 MG tablet TAKE 1 TABLET BY MOUTH  DAILY 90 tablet 3  . Coenzyme Q10 (CO Q-10) 100 MG CAPS Take 200 mg by mouth daily. (Patient taking differently: Take 200 mg by mouth at bedtime. )  0  . doxazosin (CARDURA) 8 MG tablet TAKE 1 TABLET BY MOUTH AT  BEDTIME 90 tablet 3  . ezetimibe (ZETIA) 10 MG tablet TAKE 1 TABLET BY MOUTH  DAILY 90 tablet 0  . furosemide (LASIX) 20 MG tablet Take 20 mg by mouth daily as needed.    Marland Kitchen levothyroxine (SYNTHROID, LEVOTHROID) 50 MCG tablet TAKE 1 TABLET BY MOUTH  DAILY BEFORE BREAKFAST 90 tablet 1  . losartan (COZAAR) 100 MG tablet TAKE 1 TABLET BY MOUTH  DAILY 90 tablet 3  . pantoprazole (PROTONIX) 40 MG tablet Take 1 tablet (40 mg total) by mouth daily before breakfast. MUST HAVE OFFICE VISIT FOR FURTHER REFILLS 90 tablet 0  . pravastatin (PRAVACHOL) 20 MG tablet TAKE 1 TABLET BY MOUTH  DAILY 90 tablet 3  . ranitidine (ZANTAC) 150 MG tablet Take 1 tablet (150 mg total) by mouth daily before supper. (Patient taking differently: Take  150 mg by mouth daily before supper. As needed) 30 tablet 11  . Vitamin D, Ergocalciferol, (DRISDOL) 50000 units CAPS capsule Take 1 capsule (50,000 Units total) by mouth every 7 (seven) days. 12 capsule 4  . warfarin (COUMADIN) 1 MG tablet Take 1 tablet daily or as directed by Coumadin Clinic 7 tablet 0   Current Facility-Administered Medications  Medication Dose Route Frequency Provider Last Rate Last Dose  . 0.9 %  sodium chloride infusion  500 mL Intravenous Continuous Ladene Artist, MD         Past Medical History:  Diagnosis Date  . Anxiety   . Atrial fibrillation ([place])   . CAD (coronary artery disease)    s/p CABG  . Carotid stenosis    mild by doppler  . Complication of anesthesia    zenkers  diverticulum  . Deep vein thrombophlebitis of leg (HCC)   . DJD (degenerative joint disease)   . Elevated TSH    secondary to Amio-normal free T4.   . Essential hypertension, benign   . GERD (gastroesophageal reflux disease)   . Hemorrhoids   . Hiatal hernia   . Hyperlipidemia   . PAF (paroxysmal atrial fibrillation) (HCC)    Amiodarone  . PE (pulmonary embolism)    pre 2007. Chronic anticoagulation  . Prostate cancer ([place])   . Sebaceous cyst    neck  .  Seborrheic dermatitis   . Tubular adenoma of colon 05/2011    Past Surgical History:  Procedure Laterality Date  . bilateral shoulder surgery    . CORONARY ARTERY BYPASS GRAFT     x2    2009  . I&D EXTREMITY  07/09/2011   Procedure: IRRIGATION AND DEBRIDEMENT EXTREMITY;  Surgeon: Tennis Must;  Location: [place];  Service: Orthopedics;  Laterality: Left;  . left knee arthroscopy    . MITRAL VALVE REPAIR    . prostate cancer surgery  11/1996   radial retropubic prostatectomy  . TANDEM AND OVOID INSERTION    . VASECTOMY    . ZENKER'S DIVERTICULECTOMY N/A 11/07/2012   Procedure: ZENKER'S DIVERTICULECTOMY ENDOSCOPIC;  Surgeon: Melida Quitter, MD;  Location: [place];  Service: ENT;  Laterality: N/A;     Social History   Socioeconomic History  . Marital status: Married    Spouse name: Buyer, retail  . Number of children: 3  . Years of education: Not on file  . Highest education level: Not on file  Occupational History  . Occupation: retired    Fish farm manager: RETIRED  Social Needs  . Financial resource strain: Not on file  . Food insecurity    Worry: Not on file    Inability: Not on file  . Transportation needs    Medical: Not on file    Non-medical: Not on file  Tobacco Use  . Smoking status: Former Smoker    Packs/day: 1.00    Years: 12.00    Pack years: 12.00    Types: Cigarettes    Quit date: 07/06/1968    Years since quitting: 50.9  . Smokeless tobacco: Never Used  Substance and Sexual Activity  . Alcohol use: Yes    Comment: social use  . Drug use: No  . Sexual activity: Not on file  Lifestyle  . Physical activity    Days per week: Not on file    Minutes per session: Not on file  . Stress: Not on file  Relationships  . Social Herbalist on phone: Not on file    Gets together: Not on file    Attends religious service: Not on file    Active member of club or organization: Not on file    Attends meetings of clubs or organizations: Not on file    Relationship status: Not on file  . Intimate partner violence    Fear of current or ex partner: Not on file    Emotionally abused: Not on file    Physically abused: Not on file    Forced sexual activity: Not on file  Other Topics Concern  . Not on file  Social History Narrative  . Not on file    Family History  Problem Relation Age of Onset  . Heart attack Father   . Stroke Mother   . Hypertension Mother   . Hypertension Brother   . Heart disease Brother   . Prostate cancer Brother     ROS: no fevers or chills, productive cough, hemoptysis, dysphasia, odynophagia, melena, hematochezia, dysuria, hematuria, rash, seizure activity, orthopnea, PND, pedal edema, claudication. Remaining systems are negative.   Physical Exam: Well-developed well-nourished in no acute distress.  Skin is warm and dry.  HEENT is normal.  Neck is supple.  Chest is clear to auscultation with normal expansion.  Cardiovascular exam is regular rate and rhythm.  Abdominal exam nontender or distended. No masses palpated. Extremities show no edema. neuro grossly  intact  ECG-sinus rhythm with first-degree AV block, no ST changes.  Personally reviewed  A/P  1 paroxysmal atrial fibrillation-patient is in sinus rhythm today.  Continue amiodarone at present dose.  Continue Coumadin.  Check hemoglobin, TSH, liver functions and chest x-ray.  As noted previously his beta-blocker was discontinued because of fatigue and bradycardia.  2 hypertension-blood pressure elevated; however he follows this at home and typically controlled.  Continue present medications and follow. Check potassium and renal function.  3 coronary artery disease-continue Coumadin for paroxysmal atrial fibrillation.  No aspirin.  Continue statin.  4 prior mitral valve repair-continue SBE prophylaxis.  5 hyperlipidemia-continue statin and zetia.  6 carotid artery disease-mild on most recent Dopplers.  [name], MD

## 2019-06-14 ENCOUNTER — Ambulatory Visit (INDEPENDENT_AMBULATORY_CARE_PROVIDER_SITE_OTHER): Payer: Medicare Other | Admitting: Pharmacist Clinician (PhC)/ Clinical Pharmacy Specialist

## 2019-06-14 ENCOUNTER — Encounter: Payer: Self-pay | Admitting: Cardiology

## 2019-06-14 ENCOUNTER — Ambulatory Visit (INDEPENDENT_AMBULATORY_CARE_PROVIDER_SITE_OTHER): Payer: Medicare Other | Admitting: Cardiology

## 2019-06-14 ENCOUNTER — Other Ambulatory Visit: Payer: Self-pay

## 2019-06-14 VITALS — BP 183/72 | HR 72 | Temp 98.4°F | Ht 69.0 in | Wt 175.0 lb

## 2019-06-14 DIAGNOSIS — I480 Paroxysmal atrial fibrillation: Secondary | ICD-10-CM | POA: Diagnosis not present

## 2019-06-14 DIAGNOSIS — E7800 Pure hypercholesterolemia, unspecified: Secondary | ICD-10-CM

## 2019-06-14 DIAGNOSIS — I059 Rheumatic mitral valve disease, unspecified: Secondary | ICD-10-CM

## 2019-06-14 DIAGNOSIS — Z5181 Encounter for therapeutic drug level monitoring: Secondary | ICD-10-CM | POA: Diagnosis not present

## 2019-06-14 DIAGNOSIS — Z79899 Other long term (current) drug therapy: Secondary | ICD-10-CM | POA: Diagnosis not present

## 2019-06-14 DIAGNOSIS — I10 Essential (primary) hypertension: Secondary | ICD-10-CM

## 2019-06-14 DIAGNOSIS — I2510 Atherosclerotic heart disease of native coronary artery without angina pectoris: Secondary | ICD-10-CM | POA: Diagnosis not present

## 2019-06-14 LAB — POCT INR: INR: 2.4 (ref 2.0–3.0)

## 2019-06-14 NOTE — Patient Instructions (Signed)
Medication Instructions:  NO CHANGES *If you need a refill on your cardiac medications before your next appointment, please call your pharmacy*  Lab Work: Your physician recommends that you return for lab work PRIOR TO EATING  If you have labs (blood work) drawn today and your tests are completely normal, you will receive your results only by: Marland Kitchen MyChart Message (if you have MyChart) OR . A paper copy in the mail If you have any lab test that is abnormal or we need to change your treatment, we will call you to review the results.  Testing/Procedures: A chest x-ray takes a picture of the organs and structures inside the chest, including the heart, lungs, and blood vessels. This test can show several things, including, whether the heart is enlarges; whether fluid is building up in the lungs; and whether pacemaker / defibrillator leads are still in place. [place] [facility]   Follow-Up: At [facility], you and your health needs are our priority.  As part of our continuing mission to provide you with exceptional heart care, we have created designated Provider Care Teams.  These Care Teams include your primary Cardiologist (physician) and Advanced Practice Providers (APPs -  Physician Assistants and Nurse Practitioners) who all work together to provide you with the care you need, when you need it.  Your next appointment:   6 month(s)  The format for your next appointment:   Either In Person or Virtual  Provider:   You may see [name], MD or one of the following Advanced Practice Providers on your designated Care Team:    Kerin Ransom, PA-C  [place], [place]  Coletta Memos, [place]

## 2019-06-26 ENCOUNTER — Other Ambulatory Visit: Payer: Self-pay | Admitting: Cardiology

## 2019-06-26 DIAGNOSIS — I4891 Unspecified atrial fibrillation: Secondary | ICD-10-CM

## 2019-07-29 ENCOUNTER — Ambulatory Visit: Payer: Medicare Other

## 2019-07-31 ENCOUNTER — Ambulatory Visit: Payer: Medicare Other | Attending: Internal Medicine

## 2019-07-31 DIAGNOSIS — Z23 Encounter for immunization: Secondary | ICD-10-CM | POA: Insufficient documentation

## 2019-07-31 NOTE — Progress Notes (Signed)
   Covid-19 Vaccination Clinic  Name:  John [name]    MRN: [id number] DOB: October 07, 1935  07/31/2019  John [name] was observed post Covid-19 immunization for 15 minutes without incidence. He was provided with Vaccine Information Sheet and instruction to access the V-Safe system.   John [name] was instructed to call 911 with any severe reactions post vaccine: Marland Kitchen Difficulty breathing  . Swelling of your face and throat  . A fast heartbeat  . A bad rash all over your body  . Dizziness and weakness    Immunizations Administered    Name Date Dose VIS Date Route   Pfizer COVID-19 Vaccine 07/31/2019 10:47 AM 0.3 mL 06/16/2019 Intramuscular   Manufacturer: [place]   Lot: [id number]   [place]: [id number]

## 2019-08-08 DIAGNOSIS — E782 Mixed hyperlipidemia: Secondary | ICD-10-CM | POA: Diagnosis not present

## 2019-08-08 DIAGNOSIS — K449 Diaphragmatic hernia without obstruction or gangrene: Secondary | ICD-10-CM | POA: Diagnosis not present

## 2019-08-08 DIAGNOSIS — Z0000 Encounter for general adult medical examination without abnormal findings: Secondary | ICD-10-CM | POA: Diagnosis not present

## 2019-08-08 DIAGNOSIS — K219 Gastro-esophageal reflux disease without esophagitis: Secondary | ICD-10-CM | POA: Diagnosis not present

## 2019-08-08 DIAGNOSIS — I6529 Occlusion and stenosis of unspecified carotid artery: Secondary | ICD-10-CM | POA: Diagnosis not present

## 2019-08-08 DIAGNOSIS — H9193 Unspecified hearing loss, bilateral: Secondary | ICD-10-CM | POA: Diagnosis not present

## 2019-08-08 DIAGNOSIS — I4891 Unspecified atrial fibrillation: Secondary | ICD-10-CM | POA: Diagnosis not present

## 2019-08-08 DIAGNOSIS — I10 Essential (primary) hypertension: Secondary | ICD-10-CM | POA: Diagnosis not present

## 2019-08-08 DIAGNOSIS — R7989 Other specified abnormal findings of blood chemistry: Secondary | ICD-10-CM | POA: Diagnosis not present

## 2019-08-08 DIAGNOSIS — Z7901 Long term (current) use of anticoagulants: Secondary | ICD-10-CM | POA: Diagnosis not present

## 2019-08-08 DIAGNOSIS — H532 Diplopia: Secondary | ICD-10-CM | POA: Diagnosis not present

## 2019-08-08 DIAGNOSIS — I2510 Atherosclerotic heart disease of native coronary artery without angina pectoris: Secondary | ICD-10-CM | POA: Diagnosis not present

## 2019-08-09 ENCOUNTER — Other Ambulatory Visit: Payer: Self-pay

## 2019-08-09 ENCOUNTER — Ambulatory Visit (INDEPENDENT_AMBULATORY_CARE_PROVIDER_SITE_OTHER): Payer: Medicare Other | Admitting: Pharmacist Clinician (PhC)/ Clinical Pharmacy Specialist

## 2019-08-09 DIAGNOSIS — Z5181 Encounter for therapeutic drug level monitoring: Secondary | ICD-10-CM | POA: Diagnosis not present

## 2019-08-09 DIAGNOSIS — I480 Paroxysmal atrial fibrillation: Secondary | ICD-10-CM

## 2019-08-09 DIAGNOSIS — E7800 Pure hypercholesterolemia, unspecified: Secondary | ICD-10-CM | POA: Diagnosis not present

## 2019-08-09 LAB — COMPREHENSIVE METABOLIC PANEL
ALT: 25 IU/L (ref 0–44)
AST: 24 IU/L (ref 0–40)
Albumin/Globulin Ratio: 2 (ref 1.2–2.2)
Albumin: 4.3 g/dL (ref 3.6–4.6)
Alkaline Phosphatase: 139 IU/L — ABNORMAL HIGH (ref 39–117)
BUN/Creatinine Ratio: 16 (ref 10–24)
BUN: 23 mg/dL (ref 8–27)
Bilirubin Total: 0.4 mg/dL (ref 0.0–1.2)
CO2: 21 mmol/L (ref 20–29)
Calcium: 9.2 mg/dL (ref 8.6–10.2)
Chloride: 108 mmol/L — ABNORMAL HIGH (ref 96–106)
Creatinine, Ser: 1.45 mg/dL — ABNORMAL HIGH (ref 0.76–1.27)
GFR calc Af Amer: 52 mL/min/{1.73_m2} — ABNORMAL LOW (ref >59)
GFR calc non Af Amer: 45 mL/min/{1.73_m2} — ABNORMAL LOW (ref >59)
Globulin, Total: 2.2 g/dL (ref 1.5–4.5)
Glucose: 93 mg/dL (ref 65–99)
Potassium: 5.2 mmol/L (ref 3.5–5.2)
Sodium: 144 mmol/L (ref 134–144)
Total Protein: 6.5 g/dL (ref 6.0–8.5)

## 2019-08-09 LAB — CBC
Hematocrit: 40.4 % (ref 37.5–51.0)
Hemoglobin: 13.5 g/dL (ref 13.0–17.7)
MCH: 28.7 pg (ref 26.6–33.0)
MCHC: 33.4 g/dL (ref 31.5–35.7)
MCV: 86 fL (ref 79–97)
Platelets: 197 10*3/uL (ref 150–450)
RBC: 4.71 x10E6/uL (ref 4.14–5.80)
RDW: 13.9 % (ref 11.6–15.4)
WBC: 6.2 10*3/uL (ref 3.4–10.8)

## 2019-08-09 LAB — LIPID PANEL
Chol/HDL Ratio: 2.4 ratio (ref 0.0–5.0)
Cholesterol, Total: 163 mg/dL (ref 100–199)
HDL: 69 mg/dL (ref >39)
LDL Chol Calc (NIH): 82 mg/dL (ref 0–99)
Triglycerides: 58 mg/dL (ref 0–149)
VLDL Cholesterol Cal: 12 mg/dL (ref 5–40)

## 2019-08-09 LAB — POCT INR: INR: 2 (ref 2.0–3.0)

## 2019-08-09 LAB — TSH: TSH: 5.07 u[IU]/mL — ABNORMAL HIGH (ref 0.450–4.500)

## 2019-08-10 ENCOUNTER — Encounter: Payer: Self-pay | Admitting: *Deleted

## 2019-08-15 DIAGNOSIS — Z8546 Personal history of malignant neoplasm of prostate: Secondary | ICD-10-CM | POA: Diagnosis not present

## 2019-08-21 ENCOUNTER — Ambulatory Visit: Payer: Medicare Other | Attending: Internal Medicine

## 2019-08-21 DIAGNOSIS — Z23 Encounter for immunization: Secondary | ICD-10-CM | POA: Insufficient documentation

## 2019-08-21 NOTE — Progress Notes (Signed)
   Covid-19 Vaccination Clinic  Name:  John [name]    MRN: [id number] DOB: 09-Mar-1936  08/21/2019  [name] was observed post Covid-19 immunization for 15 minutes without incidence. He was provided with Vaccine Information Sheet and instruction to access the V-Safe system.   [name] was instructed to call 911 with any severe reactions post vaccine: Marland Kitchen Difficulty breathing  . Swelling of your face and throat  . A fast heartbeat  . A bad rash all over your body  . Dizziness and weakness    Immunizations Administered    Name Date Dose VIS Date Route   Pfizer COVID-19 Vaccine 08/21/2019 10:50 AM 0.3 mL 06/16/2019 Intramuscular   Manufacturer: [place]   Lot: [id number]   [place]: [id number]

## 2019-09-17 ENCOUNTER — Other Ambulatory Visit: Payer: Self-pay | Admitting: Cardiology

## 2019-09-17 DIAGNOSIS — R7989 Other specified abnormal findings of blood chemistry: Secondary | ICD-10-CM

## 2019-09-20 ENCOUNTER — Ambulatory Visit (INDEPENDENT_AMBULATORY_CARE_PROVIDER_SITE_OTHER): Payer: Medicare Other | Admitting: Pharmacist Clinician (PhC)/ Clinical Pharmacy Specialist

## 2019-09-20 ENCOUNTER — Other Ambulatory Visit: Payer: Self-pay

## 2019-09-20 DIAGNOSIS — I480 Paroxysmal atrial fibrillation: Secondary | ICD-10-CM

## 2019-09-20 DIAGNOSIS — Z5181 Encounter for therapeutic drug level monitoring: Secondary | ICD-10-CM | POA: Diagnosis not present

## 2019-09-20 LAB — POCT INR: INR: 2.5 (ref 2.0–3.0)

## 2019-10-12 ENCOUNTER — Other Ambulatory Visit: Payer: Self-pay | Admitting: Cardiology

## 2019-10-16 ENCOUNTER — Encounter (INDEPENDENT_AMBULATORY_CARE_PROVIDER_SITE_OTHER): Payer: Self-pay | Admitting: Ophthalmology

## 2019-10-16 ENCOUNTER — Ambulatory Visit (INDEPENDENT_AMBULATORY_CARE_PROVIDER_SITE_OTHER): Payer: Medicare Other | Admitting: Ophthalmology

## 2019-10-16 ENCOUNTER — Other Ambulatory Visit: Payer: Self-pay

## 2019-10-16 DIAGNOSIS — H35351 Cystoid macular degeneration, right eye: Secondary | ICD-10-CM

## 2019-10-16 DIAGNOSIS — Z8669 Personal history of other diseases of the nervous system and sense organs: Secondary | ICD-10-CM

## 2019-10-16 DIAGNOSIS — H532 Diplopia: Secondary | ICD-10-CM

## 2019-10-16 DIAGNOSIS — H33011 Retinal detachment with single break, right eye: Secondary | ICD-10-CM | POA: Diagnosis not present

## 2019-10-16 NOTE — Progress Notes (Signed)
10/16/2019     CHIEF COMPLAINT Patient presents for Retina Follow Up   HISTORY OF PRESENT ILLNESS: John [name] is a 84 y.o. male who presents to the clinic today for:   HPI    Retina Follow Up    Patient presents with  Other.  In right eye.  This started 6 months ago.  Severity is moderate.  Duration of 6 months.  Since onset it is stable.          Comments    6 Month F/U OD, FP OU  Pt denies noticeable changes to [place] OU since last visit. Pt denies ocular pain, flashes of light, or floaters OU.         Last edited by Rockie Neighbours, [place] on 10/16/2019  8:53 AM. (History)      Referring physician: Deland Pretty, MD [address] [place],  [place] [id number]  HISTORICAL INFORMATION:   Selected notes from the MEDICAL RECORD NUMBER    Lab Results  Component Value Date   HGBA1C  07/22/2007    5.7 (NOTE)   The ADA recommends the following therapeutic goals for glycemic   control related to Hgb A1C measurement:   Goal of Therapy:   < 7.0% Hgb A1C   Action Suggested:  > 8.0% Hgb A1C   Ref:  Diabetes Care, 22, Suppl. 1, 1999     CURRENT MEDICATIONS: No current outpatient medications on file. (Ophthalmic Drugs)   No current facility-administered medications for this visit. (Ophthalmic Drugs)   Current Outpatient Medications (Other)  Medication Sig  . amiodarone (PACERONE) 200 MG tablet TAKE 1 TABLET BY MOUTH  DAILY  . amLODipine (NORVASC) 5 MG tablet TAKE 1 TABLET BY MOUTH  DAILY  . Coenzyme Q10 (CO Q-10) 100 MG CAPS Take 200 mg by mouth daily. (Patient taking differently: Take 200 mg by mouth at bedtime. )  . doxazosin (CARDURA) 8 MG tablet TAKE 1 TABLET BY MOUTH AT  BEDTIME  . ezetimibe (ZETIA) 10 MG tablet TAKE 1 TABLET BY MOUTH  DAILY  . furosemide (LASIX) 20 MG tablet Take 20 mg by mouth daily as needed.  Marland Kitchen levothyroxine (SYNTHROID) 50 MCG tablet TAKE 1 TABLET BY MOUTH  DAILY BEFORE BREAKFAST  . losartan (COZAAR) 100 MG tablet TAKE 1 TABLET BY MOUTH   DAILY  . pantoprazole (PROTONIX) 40 MG tablet Take 1 tablet (40 mg total) by mouth daily before breakfast. MUST HAVE OFFICE VISIT FOR FURTHER REFILLS  . pravastatin (PRAVACHOL) 20 MG tablet TAKE 1 TABLET BY MOUTH  DAILY  . ranitidine (ZANTAC) 150 MG tablet Take 1 tablet (150 mg total) by mouth daily before supper. (Patient taking differently: Take 150 mg by mouth daily before supper. As needed)  . Vitamin D, Ergocalciferol, (DRISDOL) 50000 units CAPS capsule Take 1 capsule (50,000 Units total) by mouth every 7 (seven) days.  Marland Kitchen warfarin (COUMADIN) 1 MG tablet TAKE 1 TABLET BY MOUTH  DAILY OR AS DIRECTED BY  COUMADIN CLINIC   Current Facility-Administered Medications (Other)  Medication Route  . 0.9 %  sodium chloride infusion Intravenous      REVIEW OF SYSTEMS:    ALLERGIES Allergies  Allergen Reactions  . Iodine Hives  . Iohexol Hives     PT ALLERGIC TO IV CONTRAST;(REACTION DURING CARDIAC CATHETERIZATION); NEEDS PREMEDS PER DR CRENSHAW!, Onset Date: [id number]   . Statins Other (See Comments)    Joint aches with Zocor, Lipitor, Crestor    PAST MEDICAL HISTORY Past Medical History:  Diagnosis Date  . Anxiety   . Atrial fibrillation ([place])   . CAD (coronary artery disease)    s/p CABG  . Carotid stenosis    mild by doppler  . Complication of anesthesia    zenkers  diverticulum  . Deep vein thrombophlebitis of leg (HCC)   . DJD (degenerative joint disease)   . Elevated TSH    secondary to Amio-normal free T4.   . Essential hypertension, benign   . GERD (gastroesophageal reflux disease)   . Hemorrhoids   . Hiatal hernia   . Hyperlipidemia   . PAF (paroxysmal atrial fibrillation) (H[facility])    Amiodarone  . PE (pulmonary embolism)    pre 2007. Chronic anticoagulation  . Prostate cancer ([place])   . Sebaceous cyst    neck  . Seborrheic dermatitis   . Tubular adenoma of colon 05/2011   Past Surgical History:  Procedure Laterality Date  . bilateral shoulder surgery    .  CORONARY ARTERY BYPASS GRAFT     x2    2009  . I & D EXTREMITY  07/09/2011   Procedure: IRRIGATION AND DEBRIDEMENT EXTREMITY;  Surgeon: Tennis Must;  Location: [place];  Service: Orthopedics;  Laterality: Left;  . left knee arthroscopy    . MITRAL VALVE REPAIR    . prostate cancer surgery  11/1996   radial retropubic prostatectomy  . TANDEM AND OVOID INSERTION    . VASECTOMY    . ZENKER'S DIVERTICULECTOMY N/A 11/07/2012   Procedure: ZENKER'S DIVERTICULECTOMY ENDOSCOPIC;  Surgeon: Melida Quitter, MD;  Location: [facility];  Service: ENT;  Laterality: N/A;    FAMILY HISTORY Family History  Problem Relation Age of Onset  . Heart attack Father   . Stroke Mother   . Hypertension Mother   . Hypertension Brother   . Heart disease Brother   . Prostate cancer Brother     SOCIAL HISTORY Social History   Tobacco Use  . Smoking status: Former Smoker    Packs/day: 1.00    Years: 12.00    Pack years: 12.00    Types: Cigarettes    Quit date: 07/06/1968    Years since quitting: 51.3  . Smokeless tobacco: Never Used  Substance Use Topics  . Alcohol use: Yes    Comment: social use  . Drug use: No         OPHTHALMIC EXAM:  Base Eye Exam    Visual Acuity (Snellen - Linear)      Right Left   Dist [place] 20/200 20/20 -1   Dist ph [place] 20/100 -2        Tonometry (Tonopen, 8:59 AM)      Right Left   Pressure 12 18       Pupils      Pupils Dark Light Shape React APD   Right PERRL 4 3 Round Brisk None   Left PERRL 4 3 Round Brisk None       Visual Fields (Counting fingers)      Left Right    Full    Restrictions  Total superior temporal, inferior temporal, superior nasal deficiencies       Extraocular Movement      Right Left    Full Full       Neuro/Psych    Oriented x3: Yes   Mood/Affect: Normal       Dilation    Both eyes: 1.0% Mydriacyl, 2.5% Phenylephrine @ 8:59 AM  Slit Lamp and Fundus Exam    External Exam      Right Left   External Normal  Normal       Slit Lamp Exam      Right Left   Lids/Lashes Normal    Conjunctiva/Sclera White and quiet    Cornea Clear    Anterior Chamber Deep and quiet    Iris Round and reactive    Lens Posterior chamber intraocular lens    Anterior Vitreous Vitrectomized, clear        Fundus Exam      Right Left   Posterior Vitreous Vitrectomized, clear Central vitreous floaters, Posterior vitreous detachment   Disc  Normal   C/D Ratio 0.7 0.4   Macula Old chorioretinal scars.  Detached Normal   Vessels  Normal   Periphery Old retinopexy laser scars attached 360 Normal          IMAGING AND PROCEDURES  Imaging and Procedures for @TODAY @  Color Fundus Photography Optos - OU - Both Eyes       Right Eye Progression has improved. Disc findings include increased cup to disc ratio.   Left Eye Progression has improved. Disc findings include normal observations. Macula : normal observations. Vessels : normal observations. Periphery : normal observations.   Notes History of complex retinal detachment and its repair right eye with multifocal chorioretinal scars.  Macula is flat and attached.  Some optic atrophy is noted.  Media is clear.  Left eye with vitreous debris, retinas attached medial clear vessels normal                ASSESSMENT/PLAN:  @PROBAPNOTE @    ICD-10-CM   1. Cystoid macular edema of right eye  H35.351 Color Fundus Photography Optos - OU - Both Eyes  2. Diplopia  H53.2   3. Retinal detachment of right eye with single break  H33.011   4. History of retinal detachment  Z86.69     1.  2.  3.  Ophthalmic Meds Ordered this visit:  No orders of the defined types were placed in this encounter.      Return in about 9 months (around 07/17/2020) for DILATE OU, OCT.  Patient Instructions  The nature of cystoid macular edema including causes, exacerbating factors, and treatments including steroid and nonsteroidal anti-inflammatory drops, periocular  injections of steroids, and intravitreal injections of steroids were reviewed. An informational brochure was offered.  The patient's questions were answered. The potential side effects of the various treatments were reviewed including the potential for intraocular pressure rise with steroid treatments.  I often use topical NSAIDS alone or in conjunction with periocular steroids to resolve this condition.   Patient instructed not to compress or "rub" on the eye.  More rarely, surgery may be considered if the condition is not improving.    Explained the diagnoses, plan, and follow up with the patient and they expressed understanding.  Patient expressed understanding of the importance of proper follow up care.   Clent Demark Glorimar Stroope M.D. Diseases & Surgery of the Retina and Vitreous Retina & Diabetic [place] @TODAY @     Abbreviations: M myopia (nearsighted); A astigmatism; H hyperopia (farsighted); P presbyopia; Mrx spectacle prescription;  CTL contact lenses; OD right eye; OS left eye; OU both eyes  XT exotropia; ET esotropia; PEK punctate epithelial keratitis; PEE punctate epithelial erosions; DES dry eye syndrome; MGD meibomian gland dysfunction; ATs artificial tears; PFAT's preservative free artificial tears; [place] nuclear sclerotic cataract; PSC posterior subcapsular cataract;  ERM epi-retinal membrane; PVD posterior vitreous detachment; RD retinal detachment; DM diabetes mellitus; DR diabetic retinopathy; NPDR non-proliferative diabetic retinopathy; PDR proliferative diabetic retinopathy; CSME clinically significant macular edema; DME diabetic macular edema; dbh dot blot hemorrhages; CWS cotton wool spot; POAG primary open angle glaucoma; C/D cup-to-disc ratio; HVF humphrey visual field; GVF goldmann visual field; OCT optical coherence tomography; IOP intraocular pressure; BRVO Branch retinal vein occlusion; CRVO central retinal vein occlusion; CRAO central retinal artery occlusion; BRAO branch retinal  artery occlusion; RT retinal tear; SB scleral buckle; PPV pars plana vitrectomy; VH Vitreous hemorrhage; PRP panretinal laser photocoagulation; IVK intravitreal kenalog; VMT vitreomacular traction; MH Macular hole;  NVD neovascularization of the disc; NVE neovascularization elsewhere; AREDS age related eye disease study; ARMD age related macular degeneration; POAG primary open angle glaucoma; EBMD epithelial/anterior basement membrane dystrophy; ACIOL anterior chamber intraocular lens; IOL intraocular lens; PCIOL posterior chamber intraocular lens; Phaco/IOL phacoemulsification with intraocular lens placement; [place] photorefractive keratectomy; LASIK laser assisted in situ keratomileusis; HTN hypertension; DM diabetes mellitus; COPD chronic obstructive pulmonary disease

## 2019-10-16 NOTE — Patient Instructions (Signed)
The nature of cystoid macular edema including causes, exacerbating factors, and treatments including steroid and nonsteroidal anti-inflammatory drops, periocular injections of steroids, and intravitreal injections of steroids were reviewed. An informational brochure was offered.  The patient's questions were answered. The potential side effects of the various treatments were reviewed including the potential for intraocular pressure rise with steroid treatments.  I often use topical NSAIDS alone or in conjunction with periocular steroids to resolve this condition.   Patient instructed not to compress or "rub" on the eye.  More rarely, surgery may be considered if the condition is not improving.

## 2019-11-01 ENCOUNTER — Ambulatory Visit (INDEPENDENT_AMBULATORY_CARE_PROVIDER_SITE_OTHER): Payer: Medicare Other | Admitting: Pharmacist Clinician (PhC)/ Clinical Pharmacy Specialist

## 2019-11-01 ENCOUNTER — Other Ambulatory Visit: Payer: Self-pay

## 2019-11-01 DIAGNOSIS — I480 Paroxysmal atrial fibrillation: Secondary | ICD-10-CM | POA: Diagnosis not present

## 2019-11-01 DIAGNOSIS — Z5181 Encounter for therapeutic drug level monitoring: Secondary | ICD-10-CM

## 2019-11-01 LAB — POCT INR: INR: 2.4 (ref 2.0–3.0)

## 2019-12-04 ENCOUNTER — Other Ambulatory Visit: Payer: Self-pay | Admitting: Cardiology

## 2019-12-04 DIAGNOSIS — I10 Essential (primary) hypertension: Secondary | ICD-10-CM

## 2019-12-13 ENCOUNTER — Other Ambulatory Visit: Payer: Self-pay

## 2019-12-13 ENCOUNTER — Ambulatory Visit (INDEPENDENT_AMBULATORY_CARE_PROVIDER_SITE_OTHER): Payer: Medicare Other | Admitting: Pharmacist Clinician (PhC)/ Clinical Pharmacy Specialist

## 2019-12-13 DIAGNOSIS — Z7901 Long term (current) use of anticoagulants: Secondary | ICD-10-CM | POA: Diagnosis not present

## 2019-12-13 DIAGNOSIS — Z5181 Encounter for therapeutic drug level monitoring: Secondary | ICD-10-CM | POA: Diagnosis not present

## 2019-12-13 DIAGNOSIS — I480 Paroxysmal atrial fibrillation: Secondary | ICD-10-CM | POA: Diagnosis not present

## 2019-12-13 LAB — POCT INR: INR: 2 (ref 2.0–3.0)

## 2019-12-20 ENCOUNTER — Other Ambulatory Visit: Payer: Self-pay | Admitting: Cardiology

## 2019-12-20 DIAGNOSIS — I10 Essential (primary) hypertension: Secondary | ICD-10-CM

## 2019-12-28 NOTE — Progress Notes (Signed)
HPI: FU coronary artery disease status post coronary bypassing graft, as well as mitral valve repair in January 2009. The patient had an Myoview performed on July 12, 2008. This was normal with an ejection fraction of 66%. Pt also with h/o atrial fibrillationand on amiodarone. Echo repeated 10/18 and showed normal LV function; s/p MV repair; mean gradient 5 mmHg and mild MR; moderate LAE; mild RVE.ABIs July 2019 normal. Carotid Dopplers August 2019 showed 1 to 39% bilateral stenosis. Since last seen,he denies dyspnea, chest pain, palpitations or syncope.  Current Outpatient Medications  Medication Sig Dispense Refill  . amiodarone (PACERONE) 200 MG tablet TAKE 1 TABLET BY MOUTH  DAILY 90 tablet 3  . amLODipine (NORVASC) 5 MG tablet TAKE 1 TABLET BY MOUTH  DAILY 90 tablet 2  . doxazosin (CARDURA) 8 MG tablet TAKE 1 TABLET BY MOUTH AT  BEDTIME 90 tablet 2  . ezetimibe (ZETIA) 10 MG tablet TAKE 1 TABLET BY MOUTH  DAILY 90 tablet 0  . furosemide (LASIX) 20 MG tablet Take 20 mg by mouth daily as needed.    Marland Kitchen levothyroxine (SYNTHROID) 50 MCG tablet TAKE 1 TABLET BY MOUTH  DAILY BEFORE BREAKFAST 90 tablet 3  . losartan (COZAAR) 100 MG tablet TAKE 1 TABLET BY MOUTH  DAILY 90 tablet 3  . pravastatin (PRAVACHOL) 20 MG tablet TAKE 1 TABLET BY MOUTH  DAILY 90 tablet 2  . Vitamin D, Ergocalciferol, (DRISDOL) 50000 units CAPS capsule Take 1 capsule (50,000 Units total) by mouth every 7 (seven) days. 12 capsule 4  . warfarin (COUMADIN) 1 MG tablet TAKE 1 TABLET BY MOUTH  DAILY OR AS DIRECTED BY  COUMADIN CLINIC 90 tablet 1   Current Facility-Administered Medications  Medication Dose Route Frequency Provider Last Rate Last Admin  . 0.9 %  sodium chloride infusion  500 mL Intravenous Continuous Ladene Artist, MD         Past Medical History:  Diagnosis Date  . Anxiety   . Atrial fibrillation ([place])   . CAD (coronary artery disease)    s/p CABG  . Carotid stenosis    mild by doppler  .  Complication of anesthesia    zenkers  diverticulum  . Deep vein thrombophlebitis of leg ([facility])   . DJD (degenerative joint disease)   . Elevated TSH    secondary to Amio-normal free T4.   . Essential hypertension, benign   . GERD (gastroesophageal reflux disease)   . Hemorrhoids   . Hiatal hernia   . Hyperlipidemia   . PAF (paroxysmal atrial fibrillation) ([facility])    Amiodarone  . PE (pulmonary embolism)    pre 2007. Chronic anticoagulation  . Prostate cancer ([place])   . Sebaceous cyst    neck  . Seborrheic dermatitis   . Tubular adenoma of colon 05/2011    Past Surgical History:  Procedure Laterality Date  . bilateral shoulder surgery    . CORONARY ARTERY BYPASS GRAFT     x2    2009  . I & D EXTREMITY  07/09/2011   Procedure: IRRIGATION AND DEBRIDEMENT EXTREMITY;  Surgeon: Tennis Must;  Location: [facility];  Service: Orthopedics;  Laterality: Left;  . left knee arthroscopy    . MITRAL VALVE REPAIR    . prostate cancer surgery  11/1996   radial retropubic prostatectomy  . TANDEM AND OVOID INSERTION    . VASECTOMY    . ZENKER'S DIVERTICULECTOMY N/A 11/07/2012   Procedure: ZENKER'S DIVERTICULECTOMY ENDOSCOPIC;  Surgeon: Melida Quitter, MD;  Location: [facility];  Service: ENT;  Laterality: N/A;    Social History   Socioeconomic History  . Marital status: Married    Spouse name: Buyer, retail  . Number of children: 3  . Years of education: Not on file  . Highest education level: Not on file  Occupational History  . Occupation: retired    Fish farm manager: RETIRED  Tobacco Use  . Smoking status: Former Smoker    Packs/day: 1.00    Years: 12.00    Pack years: 12.00    Types: Cigarettes    Quit date: 07/06/1968    Years since quitting: 51.5  . Smokeless tobacco: Never Used  Substance and Sexual Activity  . Alcohol use: Yes    Comment: social use  . Drug use: No  . Sexual activity: Not on file  Other Topics Concern  . Not on file  Social History Narrative  . Not on file     Social Determinants of Health   Financial Resource Strain:   . Difficulty of Paying Living Expenses:   Food Insecurity:   . Worried About Charity fundraiser in the Last Year:   . Arboriculturist in the Last Year:   Transportation Needs:   . Film/video editor (Medical):   Marland Kitchen Lack of Transportation (Non-Medical):   Physical Activity:   . Days of Exercise per Week:   . Minutes of Exercise per Session:   Stress:   . Feeling of Stress :   Social Connections:   . Frequency of Communication with Friends and Family:   . Frequency of Social Gatherings with Friends and Family:   . Attends Religious Services:   . Active Member of Clubs or Organizations:   . Attends Archivist Meetings:   Marland Kitchen Marital Status:   Intimate Partner Violence:   . Fear of Current or Ex-Partner:   . Emotionally Abused:   Marland Kitchen Physically Abused:   . Sexually Abused:     Family History  Problem Relation Age of Onset  . Heart attack Father   . Stroke Mother   . Hypertension Mother   . Hypertension Brother   . Heart disease Brother   . Prostate cancer Brother     ROS: no fevers or chills, productive cough, hemoptysis, dysphasia, odynophagia, melena, hematochezia, dysuria, hematuria, rash, seizure activity, orthopnea, PND, pedal edema, claudication. Remaining systems are negative.  Physical Exam: Well-developed well-nourished in no acute distress.  Skin is warm and dry.  HEENT is normal.  Neck is supple.  Chest is clear to auscultation with normal expansion.  Cardiovascular exam is regular rate and rhythm.  Abdominal exam nontender or distended. No masses palpated. Extremities show no edema. neuro grossly intact  ECG-sinus rhythm at a rate of 73, no ST changes.  Personally reviewed  A/P  1 paroxysmal atrial fibrillation-patient remains in sinus rhythm.  Continue amiodarone and Coumadin.  Check TSH, liver functions and chest x-ray.  2 hypertension-blood pressure is controlled.  Continue  present medical regimen.  3 prior mitral valve repair-continue SBE prophylaxis.  4 coronary artery disease-continue statin.  Patient is not on aspirin given need for Coumadin.  5 hyperlipidemia-continue present medications.  He did not tolerate high-dose statins in the past.  6 carotid artery disease-mild on most recent Dopplers.  [name], MD

## 2020-01-09 ENCOUNTER — Ambulatory Visit (INDEPENDENT_AMBULATORY_CARE_PROVIDER_SITE_OTHER): Payer: Medicare Other | Admitting: Cardiology

## 2020-01-09 ENCOUNTER — Encounter: Payer: Self-pay | Admitting: Cardiology

## 2020-01-09 ENCOUNTER — Ambulatory Visit (INDEPENDENT_AMBULATORY_CARE_PROVIDER_SITE_OTHER): Payer: Medicare Other | Admitting: Pharmacist Clinician (PhC)/ Clinical Pharmacy Specialist

## 2020-01-09 ENCOUNTER — Other Ambulatory Visit: Payer: Self-pay

## 2020-01-09 VITALS — BP 142/52 | HR 73 | Ht 69.0 in | Wt 177.6 lb

## 2020-01-09 DIAGNOSIS — I10 Essential (primary) hypertension: Secondary | ICD-10-CM

## 2020-01-09 DIAGNOSIS — I480 Paroxysmal atrial fibrillation: Secondary | ICD-10-CM | POA: Diagnosis not present

## 2020-01-09 DIAGNOSIS — Z5181 Encounter for therapeutic drug level monitoring: Secondary | ICD-10-CM

## 2020-01-09 DIAGNOSIS — I059 Rheumatic mitral valve disease, unspecified: Secondary | ICD-10-CM

## 2020-01-09 DIAGNOSIS — I2510 Atherosclerotic heart disease of native coronary artery without angina pectoris: Secondary | ICD-10-CM | POA: Diagnosis not present

## 2020-01-09 LAB — COMPREHENSIVE METABOLIC PANEL
ALT: 26 IU/L (ref 0–44)
AST: 27 IU/L (ref 0–40)
Albumin/Globulin Ratio: 1.8 (ref 1.2–2.2)
Albumin: 4.2 g/dL (ref 3.6–4.6)
Alkaline Phosphatase: 146 IU/L — ABNORMAL HIGH (ref 48–121)
BUN/Creatinine Ratio: 17 (ref 10–24)
BUN: 25 mg/dL (ref 8–27)
Bilirubin Total: 0.5 mg/dL (ref 0.0–1.2)
CO2: 19 mmol/L — ABNORMAL LOW (ref 20–29)
Calcium: 9.1 mg/dL (ref 8.6–10.2)
Chloride: 109 mmol/L — ABNORMAL HIGH (ref 96–106)
Creatinine, Ser: 1.44 mg/dL — ABNORMAL HIGH (ref 0.76–1.27)
GFR calc Af Amer: 52 mL/min/{1.73_m2} — ABNORMAL LOW (ref >59)
GFR calc non Af Amer: 45 mL/min/{1.73_m2} — ABNORMAL LOW (ref >59)
Globulin, Total: 2.4 g/dL (ref 1.5–4.5)
Glucose: 93 mg/dL (ref 65–99)
Potassium: 4.8 mmol/L (ref 3.5–5.2)
Sodium: 142 mmol/L (ref 134–144)
Total Protein: 6.6 g/dL (ref 6.0–8.5)

## 2020-01-09 LAB — POCT INR: INR: 2.3 (ref 2.0–3.0)

## 2020-01-09 LAB — TSH: TSH: 3.97 u[IU]/mL (ref 0.450–4.500)

## 2020-01-09 NOTE — Patient Instructions (Signed)
Medication Instructions:  NO CHANGE *If you need a refill on your cardiac medications before your next appointment, please call your pharmacy*   Lab Work: Your physician recommends that you HAVE LAB WORK TODAY If you have labs (blood work) drawn today and your tests are completely normal, you will receive your results only by: Marland Kitchen MyChart Message (if you have MyChart) OR . A paper copy in the mail If you have any lab test that is abnormal or we need to change your treatment, we will call you to review the results.   Testing/Procedures:  A chest x-ray takes a picture of the organs and structures inside the chest, including the heart, lungs, and blood vessels. This test can show several things, including, whether the heart is enlarges; whether fluid is building up in the lungs; and whether pacemaker / defibrillator leads are still in place. [address]   Follow-Up: At [facility], you and your health needs are our priority.  As part of our continuing mission to provide you with exceptional heart care, we have created designated Provider Care Teams.  These Care Teams include your primary Cardiologist (physician) and Advanced Practice Providers (APPs -  Physician Assistants and Nurse Practitioners) who all work together to provide you with the care you need, when you need it.  We recommend signing up for the patient portal called "MyChart".  Sign up information is provided on this After Visit Summary.  MyChart is used to connect with patients for Virtual Visits (Telemedicine).  Patients are able to view lab/test results, encounter notes, upcoming appointments, etc.  Non-urgent messages can be sent to your provider as well.   To learn more about what you can do with MyChart, go to [email or web address].    Your next appointment:   6 month(s)  The format for your next appointment:   Either In Person or Virtual  Provider:   You may see [name], MD or one of  the following Advanced Practice Providers on your designated Care Team:    Kerin Ransom, PA-C  [place], [place]  Coletta Memos, [place]

## 2020-01-10 ENCOUNTER — Encounter: Payer: Self-pay | Admitting: *Deleted

## 2020-01-30 ENCOUNTER — Encounter: Payer: Self-pay | Admitting: *Deleted

## 2020-01-31 ENCOUNTER — Other Ambulatory Visit: Payer: Self-pay

## 2020-01-31 ENCOUNTER — Ambulatory Visit
Admission: RE | Admit: 2020-01-31 | Discharge: 2020-01-31 | Disposition: A | Discharge status: Home / Self Care | Payer: Medicare Other | Source: Ambulatory Visit | Attending: Cardiology | Admitting: Cardiology

## 2020-01-31 DIAGNOSIS — I480 Paroxysmal atrial fibrillation: Secondary | ICD-10-CM

## 2020-01-31 DIAGNOSIS — I700 Atherosclerosis of aorta: Secondary | ICD-10-CM | POA: Diagnosis not present

## 2020-02-19 ENCOUNTER — Ambulatory Visit (INDEPENDENT_AMBULATORY_CARE_PROVIDER_SITE_OTHER): Payer: Medicare Other

## 2020-02-19 ENCOUNTER — Other Ambulatory Visit: Payer: Self-pay

## 2020-02-19 DIAGNOSIS — I480 Paroxysmal atrial fibrillation: Secondary | ICD-10-CM

## 2020-02-19 DIAGNOSIS — Z5181 Encounter for therapeutic drug level monitoring: Secondary | ICD-10-CM

## 2020-02-19 LAB — POCT INR: INR: 2 (ref 2.0–3.0)

## 2020-02-19 NOTE — Patient Instructions (Signed)
Continue taking warfarin 1mg  (1 tablet) daily. Next INR in 6 wks

## 2020-03-11 ENCOUNTER — Other Ambulatory Visit: Payer: Self-pay | Admitting: Cardiology

## 2020-03-27 DIAGNOSIS — Z23 Encounter for immunization: Secondary | ICD-10-CM | POA: Diagnosis not present

## 2020-04-03 ENCOUNTER — Other Ambulatory Visit: Payer: Self-pay

## 2020-04-03 ENCOUNTER — Ambulatory Visit (INDEPENDENT_AMBULATORY_CARE_PROVIDER_SITE_OTHER): Payer: Medicare Other

## 2020-04-03 DIAGNOSIS — Z5181 Encounter for therapeutic drug level monitoring: Secondary | ICD-10-CM

## 2020-04-03 DIAGNOSIS — I480 Paroxysmal atrial fibrillation: Secondary | ICD-10-CM

## 2020-04-03 LAB — POCT INR: INR: 2.1 (ref 2.0–3.0)

## 2020-04-03 NOTE — Patient Instructions (Signed)
Continue taking warfarin 1mg  (1 tablet) daily. Next INR in 6 wks

## 2020-05-07 ENCOUNTER — Other Ambulatory Visit: Payer: Self-pay | Admitting: Cardiology

## 2020-05-07 DIAGNOSIS — I4891 Unspecified atrial fibrillation: Secondary | ICD-10-CM

## 2020-05-15 ENCOUNTER — Ambulatory Visit (INDEPENDENT_AMBULATORY_CARE_PROVIDER_SITE_OTHER): Payer: Medicare Other

## 2020-05-15 DIAGNOSIS — I480 Paroxysmal atrial fibrillation: Secondary | ICD-10-CM

## 2020-05-15 DIAGNOSIS — Z5181 Encounter for therapeutic drug level monitoring: Secondary | ICD-10-CM | POA: Diagnosis not present

## 2020-05-15 LAB — POCT INR: INR: 1.8 — AB (ref 2.0–3.0)

## 2020-05-15 NOTE — Patient Instructions (Signed)
Take 2 tablets tonight and then Continue taking warfarin 1mg  (1 tablet) daily. Next INR in 7 wks

## 2020-07-02 NOTE — Progress Notes (Signed)
HPI: FU coronary artery disease status post coronary bypassing graft, as well as mitral valve repair in January 2009. The patient had an Myoview performed on July 12, 2008. This was normal with an ejection fraction of 66%. Pt also with h/o atrial fibrillationand on amiodarone. Echo repeated 10/18 and showed normal LV function; s/p MV repair; mean gradient 5 mmHg and mild MR; moderate LAE; mild RVE.ABIs July 2019 normal. Carotid Dopplers August 2019 showed 1 to 39% bilateral stenosis. Since last seen, there is no dyspnea, chest pain, palpitations or syncope.  No bleeding.  Current Outpatient Medications  Medication Sig Dispense Refill  . amiodarone (PACERONE) 200 MG tablet TAKE 1 TABLET BY MOUTH  DAILY 90 tablet 3  . amLODipine (NORVASC) 5 MG tablet TAKE 1 TABLET BY MOUTH  DAILY 90 tablet 2  . doxazosin (CARDURA) 8 MG tablet TAKE 1 TABLET BY MOUTH AT  BEDTIME 90 tablet 2  . ezetimibe (ZETIA) 10 MG tablet TAKE 1 TABLET BY MOUTH  DAILY 90 tablet 0  . furosemide (LASIX) 20 MG tablet Take 20 mg by mouth daily as needed.    Marland Kitchen levothyroxine (SYNTHROID) 50 MCG tablet TAKE 1 TABLET BY MOUTH  DAILY BEFORE BREAKFAST 90 tablet 3  . losartan (COZAAR) 100 MG tablet TAKE 1 TABLET BY MOUTH  DAILY 90 tablet 3  . pravastatin (PRAVACHOL) 20 MG tablet TAKE 1 TABLET BY MOUTH  DAILY 90 tablet 2  . Vitamin D, Ergocalciferol, (DRISDOL) 50000 units CAPS capsule Take 1 capsule (50,000 Units total) by mouth every 7 (seven) days. 12 capsule 4  . warfarin (COUMADIN) 1 MG tablet TAKE 1 TABLET BY MOUTH  DAILY OR AS DIRECTED BY  COUMADIN CLINIC 90 tablet 3   Current Facility-Administered Medications  Medication Dose Route Frequency Provider Last Rate Last Admin  . 0.9 %  sodium chloride infusion  500 mL Intravenous Continuous Meryl Dare, MD         Past Medical History:  Diagnosis Date  . Anxiety   . Atrial fibrillation ([facility])   . CAD (coronary artery disease)    s/p CABG  . Carotid stenosis    mild  by doppler  . Complication of anesthesia    zenkers  diverticulum  . Deep vein thrombophlebitis of leg ([facility])   . DJD (degenerative joint disease)   . Elevated TSH    secondary to Amio-normal free T4.   . Essential hypertension, benign   . GERD (gastroesophageal reflux disease)   . Hemorrhoids   . Hiatal hernia   . Hyperlipidemia   . PAF (paroxysmal atrial fibrillation) ([facility])    Amiodarone  . PE (pulmonary embolism)    pre 2007. Chronic anticoagulation  . Prostate cancer ([facility])   . Sebaceous cyst    neck  . Seborrheic dermatitis   . Tubular adenoma of colon 05/2011    Past Surgical History:  Procedure Laterality Date  . bilateral shoulder surgery    . CORONARY ARTERY BYPASS GRAFT     x2    2009  . I & D EXTREMITY  07/09/2011   Procedure: IRRIGATION AND DEBRIDEMENT EXTREMITY;  Surgeon: Tami Ribas;  Location: [facility];  Service: Orthopedics;  Laterality: Left;  . left knee arthroscopy    . MITRAL VALVE REPAIR    . prostate cancer surgery  11/1996   radial retropubic prostatectomy  . TANDEM AND OVOID INSERTION    . VASECTOMY    . ZENKER'S DIVERTICULECTOMY N/A 11/07/2012  Procedure: [id number] DIVERTICULECTOMY ENDOSCOPIC;  Surgeon: Melida Quitter, MD;  Location: [facility];  Service: ENT;  Laterality: N/A;    Social History   Socioeconomic History  . Marital status: Married    Spouse name: Buyer, retail  . Number of children: 3  . Years of education: Not on file  . Highest education level: Not on file  Occupational History  . Occupation: retired    Fish farm manager: RETIRED  Tobacco Use  . Smoking status: Former Smoker    Packs/day: 1.00    Years: 12.00    Pack years: 12.00    Types: Cigarettes    Quit date: 07/06/1968    Years since quitting: 52.0  . Smokeless tobacco: Never Used  Substance and Sexual Activity  . Alcohol use: Yes    Comment: social use  . Drug use: No  . Sexual activity: Not on file  Other Topics Concern  . Not on file  Social History Narrative   . Not on file   Social Determinants of Health   Financial Resource Strain: Not on file  Food Insecurity: Not on file  Transportation Needs: Not on file  Physical Activity: Not on file  Stress: Not on file  Social Connections: Not on file  Intimate Partner Violence: Not on file    Family History  Problem Relation Age of Onset  . Heart attack Father   . Stroke Mother   . Hypertension Mother   . Hypertension Brother   . Heart disease Brother   . Prostate cancer Brother     ROS: no fevers or chills, productive cough, hemoptysis, dysphasia, odynophagia, melena, hematochezia, dysuria, hematuria, rash, seizure activity, orthopnea, PND, pedal edema, claudication. Remaining systems are negative.  Physical Exam: Well-developed well-nourished in no acute distress.  Skin is warm and dry.  HEENT is normal.  Neck is supple.  Chest is clear to auscultation with normal expansion.  Cardiovascular exam is regular rate and rhythm.  Abdominal exam nontender or distended. No masses palpated. Extremities show no edema. neuro grossly intact  ECG-normal sinus rhythm at a rate of 75, normal axis.  Personally reviewed  A/P  1 paroxysmal atrial fibrillation-patient is in sinus rhythm today.  Continue amiodarone at present dose.  Continue Coumadin with goal INR 2-3.  2 hypertension-patient's blood pressure is borderline.  We will follow and adjust regimen as needed.  3 history of mitral valve repair-continue SBE prophylaxis.  4 coronary artery disease-patient denies chest pain.  Continue statin.  No aspirin given need for Coumadin.  5 hyperlipidemia-continue present medical regimen.  He did not tolerate high-dose statins previously.  6 carotid artery disease-mild on most recent Dopplers.  [name], MD

## 2020-07-09 ENCOUNTER — Ambulatory Visit (INDEPENDENT_AMBULATORY_CARE_PROVIDER_SITE_OTHER): Payer: Medicare Other | Admitting: Pharmacist

## 2020-07-09 ENCOUNTER — Ambulatory Visit (INDEPENDENT_AMBULATORY_CARE_PROVIDER_SITE_OTHER): Payer: Medicare Other | Admitting: Cardiology

## 2020-07-09 ENCOUNTER — Encounter: Payer: Self-pay | Admitting: Cardiology

## 2020-07-09 ENCOUNTER — Other Ambulatory Visit: Payer: Self-pay

## 2020-07-09 VITALS — BP 152/58 | HR 75 | Ht 68.0 in | Wt 173.2 lb

## 2020-07-09 DIAGNOSIS — I480 Paroxysmal atrial fibrillation: Secondary | ICD-10-CM

## 2020-07-09 DIAGNOSIS — I2510 Atherosclerotic heart disease of native coronary artery without angina pectoris: Secondary | ICD-10-CM | POA: Diagnosis not present

## 2020-07-09 DIAGNOSIS — Z5181 Encounter for therapeutic drug level monitoring: Secondary | ICD-10-CM | POA: Diagnosis not present

## 2020-07-09 DIAGNOSIS — I059 Rheumatic mitral valve disease, unspecified: Secondary | ICD-10-CM | POA: Diagnosis not present

## 2020-07-09 DIAGNOSIS — I10 Essential (primary) hypertension: Secondary | ICD-10-CM

## 2020-07-09 LAB — POCT INR: INR: 2.3 (ref 2.0–3.0)

## 2020-07-09 NOTE — Patient Instructions (Signed)

## 2020-07-09 NOTE — Patient Instructions (Addendum)
Description   Continue taking warfarin 1mg  (1 tablet) daily. Next INR in 7 wks

## 2020-07-17 ENCOUNTER — Encounter (INDEPENDENT_AMBULATORY_CARE_PROVIDER_SITE_OTHER): Payer: Self-pay | Admitting: Ophthalmology

## 2020-07-17 ENCOUNTER — Other Ambulatory Visit: Payer: Self-pay

## 2020-07-17 ENCOUNTER — Other Ambulatory Visit: Payer: Self-pay | Admitting: Cardiology

## 2020-07-17 ENCOUNTER — Ambulatory Visit (INDEPENDENT_AMBULATORY_CARE_PROVIDER_SITE_OTHER): Payer: Medicare Other | Admitting: Ophthalmology

## 2020-07-17 DIAGNOSIS — I10 Essential (primary) hypertension: Secondary | ICD-10-CM

## 2020-07-17 DIAGNOSIS — H33011 Retinal detachment with single break, right eye: Secondary | ICD-10-CM

## 2020-07-17 DIAGNOSIS — H35351 Cystoid macular degeneration, right eye: Secondary | ICD-10-CM | POA: Diagnosis not present

## 2020-07-17 NOTE — Assessment & Plan Note (Signed)
The nature of cystoid macular edema including causes, exacerbating factors, and treatments including steroid and nonsteroidal anti-inflammatory drops, periocular injections of steroids, and intravitreal injections of steroids were reviewed. An informational brochure was offered.  The patient's questions were answered. The potential side effects of the various treatments were reviewed including the potential for intraocular pressure rise with steroid treatments.  I often use topical NSAIDS alone or in conjunction with periocular steroids to resolve this condition.   Patient instructed not to compress or "rub" on the eye.  More rarely, surgery may be considered if the condition is not improving.  OD chronic no change will continue to monitor and observe

## 2020-07-17 NOTE — Progress Notes (Signed)
07/17/2020     CHIEF COMPLAINT Patient presents for Retina Follow Up (9 Month f\u OU. OCT/Pt states OD causes double vision when both eyes are open. Pt states he patches OD a lot. Denies any major changes. Pt states he sees a floater in OS. Thinks it may be getting bigger.)   HISTORY OF PRESENT ILLNESS: John [name] is a 85 y.o. male who presents to the clinic today for:   HPI    Retina Follow Up    Diagnosis: CME.  In right eye.  Severity is moderate.  Duration of 9 months.  Since onset it is stable.  I, the attending physician,  performed the HPI with the patient and updated documentation appropriately. Additional comments: 9 Month f\u OU. OCT Pt states OD causes double vision when both eyes are open. Pt states he patches OD a lot. Denies any major changes. Pt states he sees a floater in OS. Thinks it may be getting bigger.       Last edited by Tilda Franco on 07/17/2020  8:58 AM. (History)      Referring physician: Deland Pretty, MD [address],  [place] [id number]  HISTORICAL INFORMATION:   Selected notes from the MEDICAL RECORD NUMBER    Lab Results  Component Value Date   HGBA1C  07/22/2007    5.7 (NOTE)   The ADA recommends the following therapeutic goals for glycemic   control related to Hgb A1C measurement:   Goal of Therapy:   < 7.0% Hgb A1C   Action Suggested:  > 8.0% Hgb A1C   Ref:  Diabetes Care, 22, Suppl. 1, 1999     CURRENT MEDICATIONS: No current outpatient medications on file. (Ophthalmic Drugs)   No current facility-administered medications for this visit. (Ophthalmic Drugs)   Current Outpatient Medications (Other)  Medication Sig  . amiodarone (PACERONE) 200 MG tablet TAKE 1 TABLET BY MOUTH  DAILY  . amLODipine (NORVASC) 5 MG tablet TAKE 1 TABLET BY MOUTH  DAILY  . doxazosin (CARDURA) 8 MG tablet TAKE 1 TABLET BY MOUTH AT  BEDTIME  . ezetimibe (ZETIA) 10 MG tablet TAKE 1 TABLET BY MOUTH  DAILY  . furosemide (LASIX) 20 MG  tablet Take 20 mg by mouth daily as needed.  Marland Kitchen levothyroxine (SYNTHROID) 50 MCG tablet TAKE 1 TABLET BY MOUTH  DAILY BEFORE BREAKFAST  . losartan (COZAAR) 100 MG tablet TAKE 1 TABLET BY MOUTH  DAILY  . pravastatin (PRAVACHOL) 20 MG tablet TAKE 1 TABLET BY MOUTH  DAILY  . Vitamin D, Ergocalciferol, (DRISDOL) 50000 units CAPS capsule Take 1 capsule (50,000 Units total) by mouth every 7 (seven) days.  Marland Kitchen warfarin (COUMADIN) 1 MG tablet TAKE 1 TABLET BY MOUTH  DAILY OR AS DIRECTED BY  COUMADIN CLINIC   Current Facility-Administered Medications (Other)  Medication Route  . 0.9 %  sodium chloride infusion Intravenous      REVIEW OF SYSTEMS:    ALLERGIES Allergies  Allergen Reactions  . Other Other (See Comments)  . Iodine Hives  . Iohexol Hives     PT ALLERGIC TO IV CONTRAST;(REACTION DURING CARDIAC CATHETERIZATION); NEEDS PREMEDS PER DR CRENSHAW!, Onset Date: [id number]   . Statins Other (See Comments)    Joint aches with Zocor, Lipitor, Crestor    PAST MEDICAL HISTORY Past Medical History:  Diagnosis Date  . Anxiety   . Atrial fibrillation ([place])   . CAD (coronary artery disease)    s/p CABG  . Carotid stenosis  mild by doppler  . Complication of anesthesia    zenkers  diverticulum  . Deep vein thrombophlebitis of leg (HCC)   . DJD (degenerative joint disease)   . Elevated TSH    secondary to Amio-normal free T4.   . Essential hypertension, benign   . GERD (gastroesophageal reflux disease)   . Hemorrhoids   . Hiatal hernia   . Hyperlipidemia   . PAF (paroxysmal atrial fibrillation) (HCC)    Amiodarone  . PE (pulmonary embolism)    pre 2007. Chronic anticoagulation  . Prostate cancer ([place])   . Sebaceous cyst    neck  . Seborrheic dermatitis   . Tubular adenoma of colon 05/2011   Past Surgical History:  Procedure Laterality Date  . bilateral shoulder surgery    . CORONARY ARTERY BYPASS GRAFT     x2    2009  . I & D EXTREMITY  07/09/2011   Procedure: IRRIGATION  AND DEBRIDEMENT EXTREMITY;  Surgeon: Tennis Must;  Location: [place];  Service: Orthopedics;  Laterality: Left;  . left knee arthroscopy    . MITRAL VALVE REPAIR    . prostate cancer surgery  11/1996   radial retropubic prostatectomy  . TANDEM AND OVOID INSERTION    . VASECTOMY    . ZENKER'S DIVERTICULECTOMY N/A 11/07/2012   Procedure: ZENKER'S DIVERTICULECTOMY ENDOSCOPIC;  Surgeon: Melida Quitter, MD;  Location: [facility];  Service: ENT;  Laterality: N/A;    FAMILY HISTORY Family History  Problem Relation Age of Onset  . Heart attack Father   . Stroke Mother   . Hypertension Mother   . Hypertension Brother   . Heart disease Brother   . Prostate cancer Brother     SOCIAL HISTORY Social History   Tobacco Use  . Smoking status: Former Smoker    Packs/day: 1.00    Years: 12.00    Pack years: 12.00    Types: Cigarettes    Quit date: 07/06/1968    Years since quitting: 52.0  . Smokeless tobacco: Never Used  Substance Use Topics  . Alcohol use: Yes    Comment: social use  . Drug use: No         OPHTHALMIC EXAM:  Base Eye Exam    Visual Acuity (Snellen - Linear)      Right Left   Dist [place] 20/200 20/20 -1   Dist ph [place] 20/100 -2        Tonometry (Tonopen, 9:03 AM)      Right Left   Pressure 8 11       Pupils      Pupils Dark Light Shape React APD   Right PERRL 3 3 Round Minimal None   Left PERRL 3 3 Round Minimal None       Visual Fields (Counting fingers)      Left Right    Full    Restrictions  Total superior temporal, superior nasal deficiencies       Neuro/Psych    Oriented x3: Yes   Mood/Affect: Normal       Dilation    Both eyes: 1.0% Mydriacyl, 2.5% Phenylephrine @ 9:03 AM        Slit Lamp and Fundus Exam    External Exam      Right Left   External Normal Normal       Slit Lamp Exam      Right Left   Lids/Lashes Normal Normal   Conjunctiva/Sclera White and quiet White and quiet  Cornea Clear Clear   Anterior Chamber Deep  and quiet Deep and quiet   Iris Round and reactive Round and reactive   Lens Posterior chamber intraocular lens Posterior chamber intraocular lens   Anterior Vitreous Vitrectomized, clear Normal       Fundus Exam      Right Left   Posterior Vitreous Vitrectomized, clear Central vitreous floaters, Posterior vitreous detachment   Disc Peripapillary atrophy Normal   C/D Ratio 0.7 0.4   Macula Old chorioretinal scars.  Detached Normal   Vessels Normal Normal   Periphery Old retinopexy laser scars attached 360 Normal, no holes          IMAGING AND PROCEDURES  Imaging and Procedures for 07/17/20  OCT, Retina - OU - Both Eyes       Right Eye Quality was good. Scan locations included subfoveal. Central Foveal Thickness: 398. Progression has been stable. Findings include cystoid macular edema, epiretinal membrane.   Left Eye Quality was good. Scan locations included subfoveal. Central Foveal Thickness: 309. Progression has been stable. Findings include normal foveal contour.   Notes OD, with chronic CME OD as a residual of previous retinal detachment.  There is a minor epiretinal membrane.  With limited visual potential, will observe  OS is normal                ASSESSMENT/PLAN:  Cystoid macular edema of right eye The nature of cystoid macular edema including causes, exacerbating factors, and treatments including steroid and nonsteroidal anti-inflammatory drops, periocular injections of steroids, and intravitreal injections of steroids were reviewed. An informational brochure was offered.  The patient's questions were answered. The potential side effects of the various treatments were reviewed including the potential for intraocular pressure rise with steroid treatments.  I often use topical NSAIDS alone or in conjunction with periocular steroids to resolve this condition.   Patient instructed not to compress or "rub" on the eye.  More rarely, surgery may be considered if the  condition is not improving.  OD chronic no change will continue to monitor and observe      ICD-10-CM   1. Cystoid macular edema of right eye  H35.351 OCT, Retina - OU - Both Eyes  2. Retinal detachment of right eye with single break  H33.011     1.  2.  3.  Ophthalmic Meds Ordered this visit:  No orders of the defined types were placed in this encounter.      Return in about 9 months (around 04/16/2021) for DILATE OU, COLOR FP, OCT.  There are no Patient Instructions on file for this visit.   Explained the diagnoses, plan, and follow up with the patient and they expressed understanding.  Patient expressed understanding of the importance of proper follow up care.   Clent Demark Sona Nations M.D. Diseases & Surgery of the Retina and Vitreous [facility] [place] 07/17/20     Abbreviations: M myopia (nearsighted); A astigmatism; H hyperopia (farsighted); P presbyopia; Mrx spectacle prescription;  CTL contact lenses; OD right eye; OS left eye; OU both eyes  XT exotropia; ET esotropia; PEK punctate epithelial keratitis; PEE punctate epithelial erosions; DES dry eye syndrome; MGD meibomian gland dysfunction; ATs artificial tears; PFAT's preservative free artificial tears; [place] nuclear sclerotic cataract; PSC posterior subcapsular cataract; ERM epi-retinal membrane; PVD posterior vitreous detachment; RD retinal detachment; DM diabetes mellitus; DR diabetic retinopathy; NPDR non-proliferative diabetic retinopathy; PDR proliferative diabetic retinopathy; CSME clinically significant macular edema; DME diabetic macular edema; dbh dot blot  hemorrhages; CWS cotton wool spot; POAG primary open angle glaucoma; C/D cup-to-disc ratio; HVF humphrey visual field; GVF goldmann visual field; OCT optical coherence tomography; IOP intraocular pressure; BRVO Branch retinal vein occlusion; CRVO central retinal vein occlusion; CRAO central retinal artery occlusion; BRAO branch retinal artery occlusion; RT  retinal tear; SB scleral buckle; PPV pars plana vitrectomy; VH Vitreous hemorrhage; PRP panretinal laser photocoagulation; IVK intravitreal kenalog; VMT vitreomacular traction; MH Macular hole;  NVD neovascularization of the disc; NVE neovascularization elsewhere; AREDS age related eye disease study; ARMD age related macular degeneration; POAG primary open angle glaucoma; EBMD epithelial/anterior basement membrane dystrophy; ACIOL anterior chamber intraocular lens; IOL intraocular lens; PCIOL posterior chamber intraocular lens; Phaco/IOL phacoemulsification with intraocular lens placement; [place] photorefractive keratectomy; LASIK laser assisted in situ keratomileusis; HTN hypertension; DM diabetes mellitus; COPD chronic obstructive pulmonary disease

## 2020-07-19 ENCOUNTER — Other Ambulatory Visit: Payer: Self-pay | Admitting: Cardiology

## 2020-07-19 DIAGNOSIS — I10 Essential (primary) hypertension: Secondary | ICD-10-CM

## 2020-08-20 DIAGNOSIS — I4891 Unspecified atrial fibrillation: Secondary | ICD-10-CM | POA: Diagnosis not present

## 2020-08-20 DIAGNOSIS — R7989 Other specified abnormal findings of blood chemistry: Secondary | ICD-10-CM | POA: Diagnosis not present

## 2020-08-20 DIAGNOSIS — K219 Gastro-esophageal reflux disease without esophagitis: Secondary | ICD-10-CM | POA: Diagnosis not present

## 2020-08-20 DIAGNOSIS — S50311A Abrasion of right elbow, initial encounter: Secondary | ICD-10-CM | POA: Diagnosis not present

## 2020-08-20 DIAGNOSIS — Z951 Presence of aortocoronary bypass graft: Secondary | ICD-10-CM | POA: Diagnosis not present

## 2020-08-20 DIAGNOSIS — I6529 Occlusion and stenosis of unspecified carotid artery: Secondary | ICD-10-CM | POA: Diagnosis not present

## 2020-08-20 DIAGNOSIS — C61 Malignant neoplasm of prostate: Secondary | ICD-10-CM | POA: Diagnosis not present

## 2020-08-20 DIAGNOSIS — Z0000 Encounter for general adult medical examination without abnormal findings: Secondary | ICD-10-CM | POA: Diagnosis not present

## 2020-08-20 DIAGNOSIS — M40204 Unspecified kyphosis, thoracic region: Secondary | ICD-10-CM | POA: Diagnosis not present

## 2020-08-20 DIAGNOSIS — E039 Hypothyroidism, unspecified: Secondary | ICD-10-CM | POA: Diagnosis not present

## 2020-08-20 DIAGNOSIS — M8588 Other specified disorders of bone density and structure, other site: Secondary | ICD-10-CM | POA: Diagnosis not present

## 2020-08-20 DIAGNOSIS — I2510 Atherosclerotic heart disease of native coronary artery without angina pectoris: Secondary | ICD-10-CM | POA: Diagnosis not present

## 2020-08-20 DIAGNOSIS — Z23 Encounter for immunization: Secondary | ICD-10-CM | POA: Diagnosis not present

## 2020-08-28 ENCOUNTER — Other Ambulatory Visit: Payer: Self-pay

## 2020-08-28 ENCOUNTER — Ambulatory Visit (INDEPENDENT_AMBULATORY_CARE_PROVIDER_SITE_OTHER): Payer: Medicare Other

## 2020-08-28 DIAGNOSIS — I480 Paroxysmal atrial fibrillation: Secondary | ICD-10-CM

## 2020-08-28 DIAGNOSIS — Z5181 Encounter for therapeutic drug level monitoring: Secondary | ICD-10-CM | POA: Diagnosis not present

## 2020-08-28 LAB — POCT INR: INR: 2.5 (ref 2.0–3.0)

## 2020-08-28 NOTE — Patient Instructions (Addendum)
Continue taking warfarin 1mg  (1 tablet) daily. Next INR in 7 wks

## 2020-09-18 DIAGNOSIS — I10 Essential (primary) hypertension: Secondary | ICD-10-CM | POA: Diagnosis not present

## 2020-09-18 DIAGNOSIS — E039 Hypothyroidism, unspecified: Secondary | ICD-10-CM | POA: Diagnosis not present

## 2020-09-18 DIAGNOSIS — M810 Age-related osteoporosis without current pathological fracture: Secondary | ICD-10-CM | POA: Diagnosis not present

## 2020-09-18 DIAGNOSIS — E782 Mixed hyperlipidemia: Secondary | ICD-10-CM | POA: Diagnosis not present

## 2020-09-18 DIAGNOSIS — M8589 Other specified disorders of bone density and structure, multiple sites: Secondary | ICD-10-CM | POA: Diagnosis not present

## 2020-09-24 ENCOUNTER — Other Ambulatory Visit: Payer: Self-pay | Admitting: Cardiology

## 2020-09-24 DIAGNOSIS — R7989 Other specified abnormal findings of blood chemistry: Secondary | ICD-10-CM

## 2020-09-25 DIAGNOSIS — I10 Essential (primary) hypertension: Secondary | ICD-10-CM | POA: Diagnosis not present

## 2020-09-25 DIAGNOSIS — E039 Hypothyroidism, unspecified: Secondary | ICD-10-CM | POA: Diagnosis not present

## 2020-09-25 DIAGNOSIS — R748 Abnormal levels of other serum enzymes: Secondary | ICD-10-CM | POA: Diagnosis not present

## 2020-09-25 DIAGNOSIS — M810 Age-related osteoporosis without current pathological fracture: Secondary | ICD-10-CM | POA: Diagnosis not present

## 2020-09-25 DIAGNOSIS — E782 Mixed hyperlipidemia: Secondary | ICD-10-CM | POA: Diagnosis not present

## 2020-10-12 ENCOUNTER — Other Ambulatory Visit: Payer: Self-pay | Admitting: Cardiology

## 2020-10-14 DIAGNOSIS — M810 Age-related osteoporosis without current pathological fracture: Secondary | ICD-10-CM | POA: Diagnosis not present

## 2020-10-16 ENCOUNTER — Ambulatory Visit (INDEPENDENT_AMBULATORY_CARE_PROVIDER_SITE_OTHER): Payer: Medicare Other

## 2020-10-16 ENCOUNTER — Other Ambulatory Visit: Payer: Self-pay

## 2020-10-16 DIAGNOSIS — Z5181 Encounter for therapeutic drug level monitoring: Secondary | ICD-10-CM

## 2020-10-16 DIAGNOSIS — I480 Paroxysmal atrial fibrillation: Secondary | ICD-10-CM

## 2020-10-16 LAB — POCT INR: INR: 2.6 (ref 2.0–3.0)

## 2020-10-16 NOTE — Patient Instructions (Signed)
Continue taking warfarin 1mg  (1 tablet) daily. Next INR in 8 wks

## 2020-11-20 DIAGNOSIS — M810 Age-related osteoporosis without current pathological fracture: Secondary | ICD-10-CM | POA: Diagnosis not present

## 2020-11-20 DIAGNOSIS — E039 Hypothyroidism, unspecified: Secondary | ICD-10-CM | POA: Diagnosis not present

## 2020-11-20 DIAGNOSIS — E782 Mixed hyperlipidemia: Secondary | ICD-10-CM | POA: Diagnosis not present

## 2020-12-25 ENCOUNTER — Ambulatory Visit (INDEPENDENT_AMBULATORY_CARE_PROVIDER_SITE_OTHER): Payer: Medicare Other

## 2020-12-25 ENCOUNTER — Other Ambulatory Visit: Payer: Self-pay

## 2020-12-25 DIAGNOSIS — I480 Paroxysmal atrial fibrillation: Secondary | ICD-10-CM | POA: Diagnosis not present

## 2020-12-25 DIAGNOSIS — Z5181 Encounter for therapeutic drug level monitoring: Secondary | ICD-10-CM | POA: Diagnosis not present

## 2020-12-25 LAB — POCT INR: INR: 2.8 (ref 2.0–3.0)

## 2020-12-25 IMAGING — CR DG CHEST 2V
2 series · 2 of 2 positions shown · non-contrast
Comparison: Chest x-ray 12/14/2018.

CLINICAL DATA: 83-year-old male with history of amiodarone use.

EXAM:
CHEST - 2 VIEW

[w chest pa]
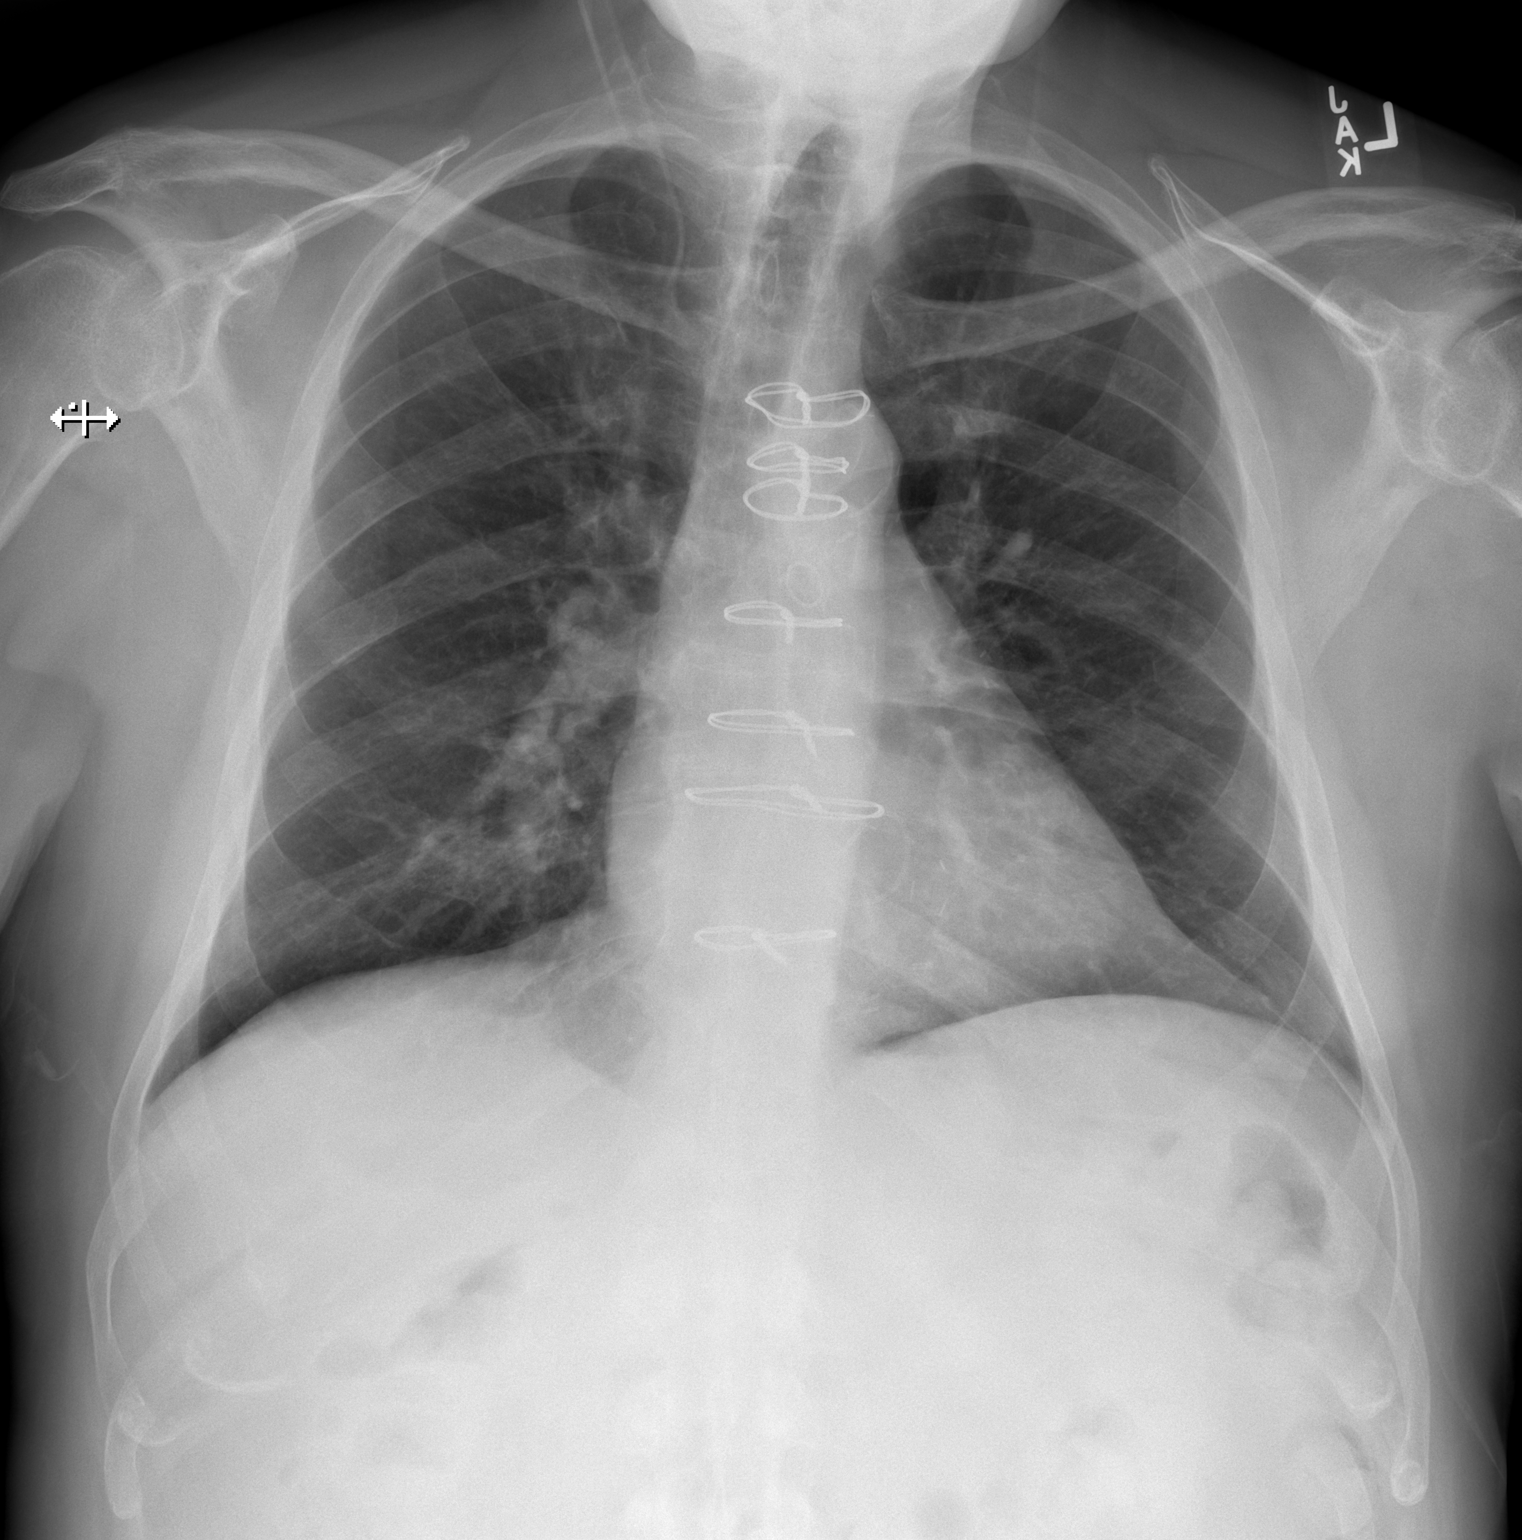

[w chest lat]
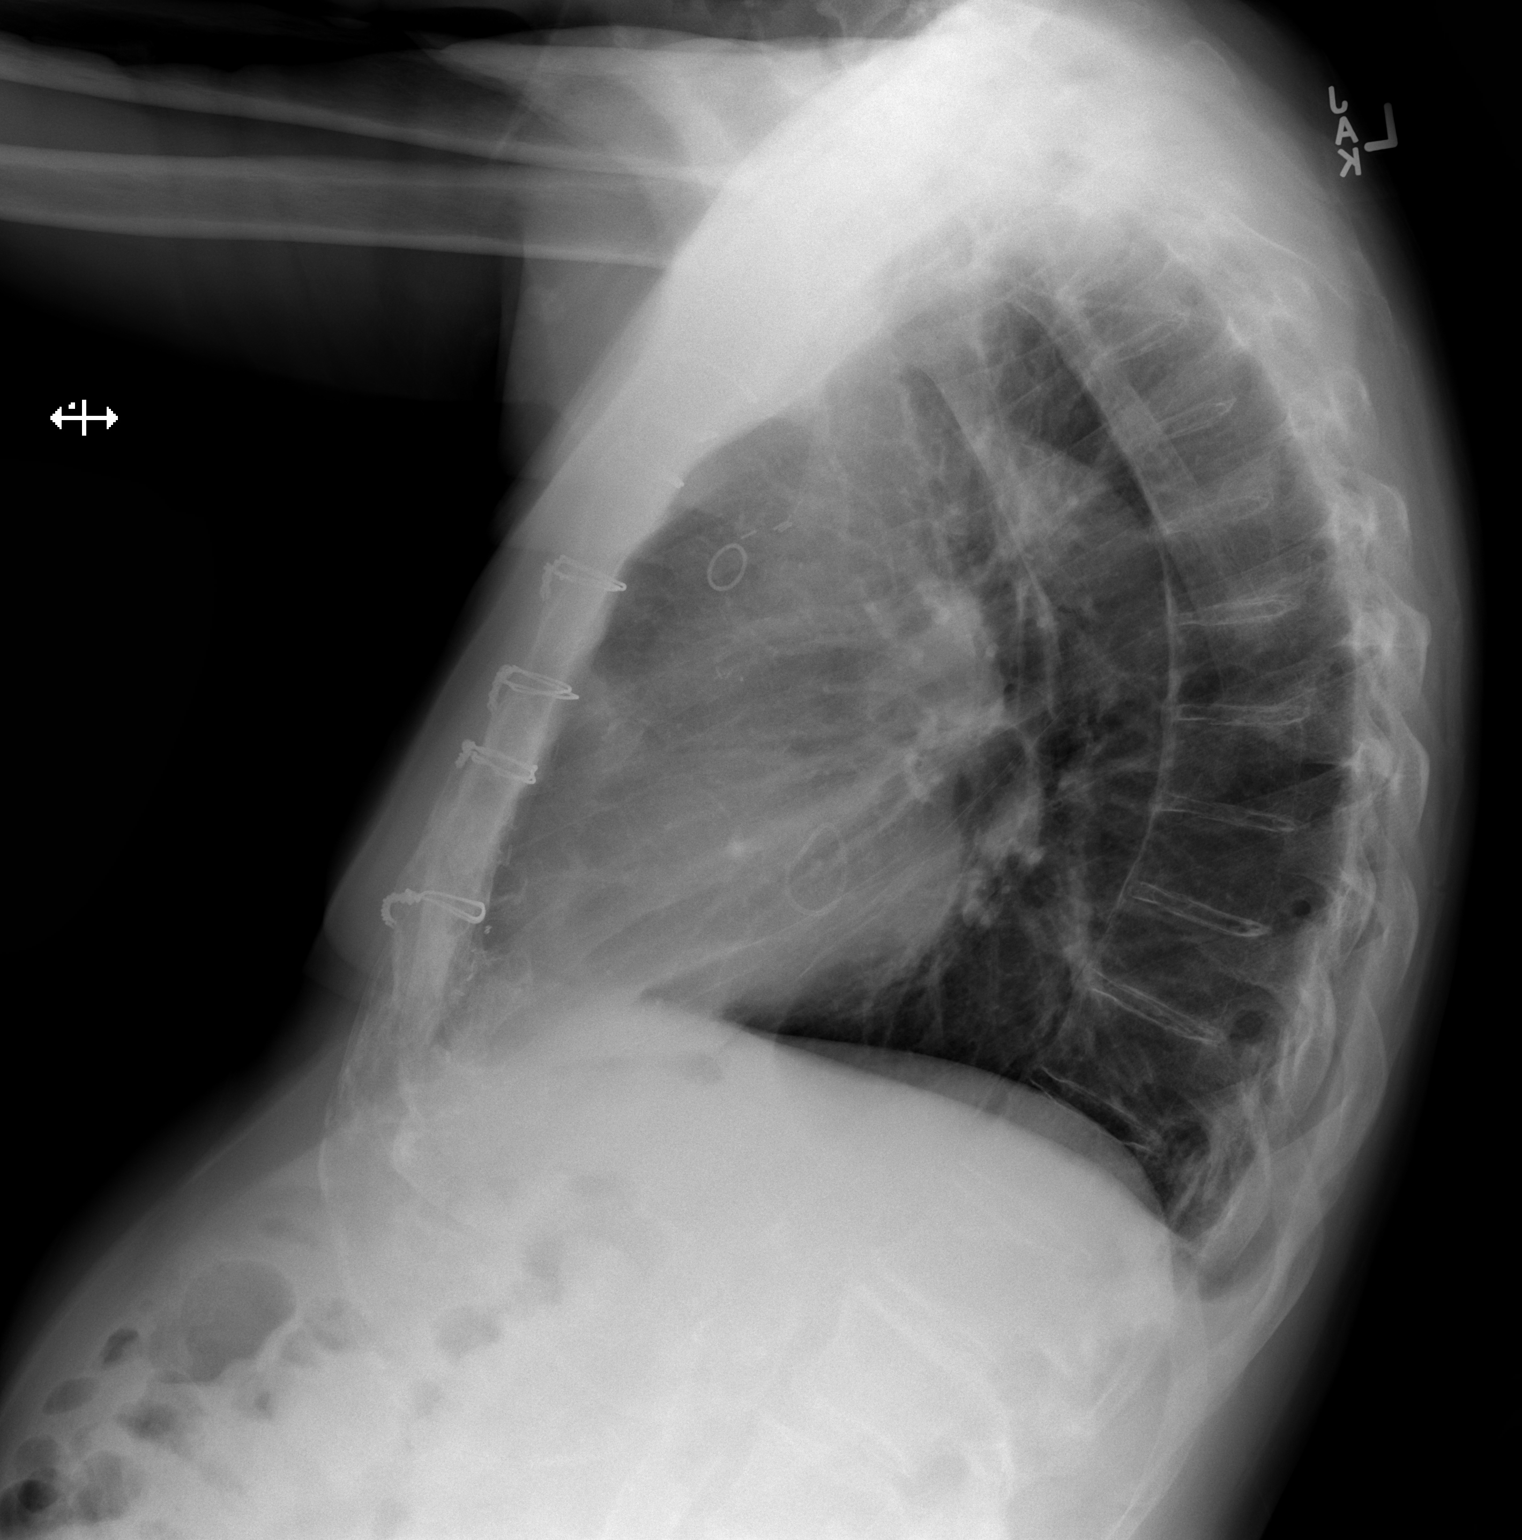

[2 of 2 positions shown; findings below may reference images not displayed]

FINDINGS: Lung volumes are normal. No consolidative airspace disease. No
pleural effusions. No pneumothorax. No pulmonary nodule or mass
noted. Pulmonary vasculature and the cardiomediastinal silhouette
are within normal limits. Atherosclerosis in the thoracic aorta.
Status post median sternotomy for CABG and mitral annuloplasty.
IMPRESSION: 1. No radiographic evidence of acute cardiopulmonary disease.
2. No definite imaging findings to suggest amiodarone induced
pulmonary toxicity.
3. Aortic atherosclerosis.

## 2020-12-25 NOTE — Patient Instructions (Signed)
Continue taking warfarin 1mg  (1 tablet) daily. Next INR in 8 wks

## 2021-01-08 DIAGNOSIS — H52203 Unspecified astigmatism, bilateral: Secondary | ICD-10-CM | POA: Diagnosis not present

## 2021-01-08 DIAGNOSIS — H532 Diplopia: Secondary | ICD-10-CM | POA: Diagnosis not present

## 2021-01-08 DIAGNOSIS — H26492 Other secondary cataract, left eye: Secondary | ICD-10-CM | POA: Diagnosis not present

## 2021-01-13 ENCOUNTER — Other Ambulatory Visit: Payer: Self-pay

## 2021-01-13 ENCOUNTER — Encounter: Payer: Self-pay | Admitting: Physician Assistant

## 2021-01-13 ENCOUNTER — Ambulatory Visit (INDEPENDENT_AMBULATORY_CARE_PROVIDER_SITE_OTHER): Payer: Medicare Other | Admitting: Physician Assistant

## 2021-01-13 VITALS — BP 130/72 | HR 72 | Ht 69.0 in | Wt 170.6 lb

## 2021-01-13 DIAGNOSIS — T462X4S Poisoning by other antidysrhythmic drugs, undetermined, sequela: Secondary | ICD-10-CM

## 2021-01-13 DIAGNOSIS — I480 Paroxysmal atrial fibrillation: Secondary | ICD-10-CM

## 2021-01-13 DIAGNOSIS — Z9889 Other specified postprocedural states: Secondary | ICD-10-CM | POA: Diagnosis not present

## 2021-01-13 DIAGNOSIS — I25810 Atherosclerosis of coronary artery bypass graft(s) without angina pectoris: Secondary | ICD-10-CM

## 2021-01-13 DIAGNOSIS — R0989 Other specified symptoms and signs involving the circulatory and respiratory systems: Secondary | ICD-10-CM | POA: Diagnosis not present

## 2021-01-13 DIAGNOSIS — E785 Hyperlipidemia, unspecified: Secondary | ICD-10-CM

## 2021-01-13 NOTE — Progress Notes (Signed)
Cardiology Office Note:    Date:  01/15/2021   ID:  John [name], DOB Nov 06, 1935, MRN [id number]  PCP:  John Pretty, MD   [facility] Providers Cardiologist:  John Ruths, MD     Referring MD: John Pretty, MD   Chief Complaint  Patient presents with   Follow-up    Seen for Dr. Stanford [name]    History of Present Illness:    John [name] is a 85 y.o. male with a hx of CAD s/p CABG, mitral valve repair Jan 2009, carotid artery disease, hyperlipidemia and history of atrial fibrillation. He had myoview in January 2010 which showed normal perfusion with EF 66%. He had cardioversion in the ED in March 2018 and also June 2018. He is taking Coumadin and amiodarone was added during the last office visit on 12/23/2016.  Repeat echo obtained in October 2018 showed normal EF, mitral valve repair, mean gradient 5 mmHg, mild MR.  ABI in July 2019 was normal.  Carotid Doppler in August 2019 showed mild disease bilaterally.  He was last seen by Dr. Stanford [name] in January 2022 at which time he was doing well.  Patient presents today for follow-up.  His heart rate is quite regular on physical exam suggestive of sinus rhythm.  TSH obtained at outside facility in February 2022 was 2.7.  Creatinine did trend up to 1.68 on the last lab work on 09/25/2020.  Last set of lipid panel however was dating back to February 2021, he is overdue for repeat fasting lipid at his PCP's office.  I recommend a repeat chest x-ray given the amiodarone use.  He also requires a carotid Doppler.  He has been noticing some pounding sensation in his left ear after laying down, if carotid Doppler is negative, no further work-up for this is needed.  Otherwise he has been doing well without any chest pain or worsening dyspnea and can follow-up in 6 months.  He only uses Lasix as needed roughly once every 3 months.  Past Medical History:  Diagnosis Date   Anxiety    Atrial fibrillation (HCC)    CAD (coronary artery disease)    s/p  CABG   Carotid stenosis    mild by doppler   Complication of anesthesia    zenkers  diverticulum   Deep vein thrombophlebitis of leg (HCC)    DJD (degenerative joint disease)    Elevated TSH    secondary to Amio-normal free T4.    Essential hypertension, benign    GERD (gastroesophageal reflux disease)    Hemorrhoids    Hiatal hernia    Hyperlipidemia    PAF (paroxysmal atrial fibrillation) (HCC)    Amiodarone   PE (pulmonary embolism)    pre 2007. Chronic anticoagulation   Prostate cancer ([place])    Sebaceous cyst    neck   Seborrheic dermatitis    Tubular adenoma of colon 05/2011    Past Surgical History:  Procedure Laterality Date   bilateral shoulder surgery     CORONARY ARTERY BYPASS GRAFT     x2    2009   I & D EXTREMITY  07/09/2011   Procedure: IRRIGATION AND DEBRIDEMENT EXTREMITY;  Surgeon: John [name];  Location: [place];  Service: Orthopedics;  Laterality: Left;   left knee arthroscopy     MITRAL VALVE REPAIR     prostate cancer surgery  11/1996   radial retropubic prostatectomy   TANDEM AND OVOID INSERTION     VASECTOMY  ZENKER'S DIVERTICULECTOMY N/A 11/07/2012   Procedure: ZENKER'S DIVERTICULECTOMY ENDOSCOPIC;  Surgeon: John Quitter, MD;  Location: [facility];  Service: ENT;  Laterality: N/A;    Current Medications: Current Meds  Medication Sig   amiodarone (PACERONE) 200 MG tablet TAKE 1 TABLET BY MOUTH  DAILY   amLODipine (NORVASC) 5 MG tablet TAKE 1 TABLET BY MOUTH  DAILY   doxazosin (CARDURA) 8 MG tablet TAKE 1 TABLET BY MOUTH AT  BEDTIME   ezetimibe (ZETIA) 10 MG tablet TAKE 1 TABLET BY MOUTH  DAILY   furosemide (LASIX) 20 MG tablet Take 20 mg by mouth daily as needed.   levothyroxine (SYNTHROID) 50 MCG tablet TAKE 1 TABLET BY MOUTH  DAILY BEFORE BREAKFAST   losartan (COZAAR) 100 MG tablet TAKE 1 TABLET BY MOUTH  DAILY   pravastatin (PRAVACHOL) 20 MG tablet TAKE 1 TABLET BY MOUTH  DAILY   Vitamin D, Ergocalciferol, (DRISDOL) 50000 units  CAPS capsule Take 1 capsule (50,000 Units total) by mouth every 7 (seven) days.   warfarin (COUMADIN) 1 MG tablet TAKE 1 TABLET BY MOUTH  DAILY OR AS DIRECTED BY  COUMADIN CLINIC   Current Facility-Administered Medications for the 01/13/21 encounter (Office Visit) with John Deforest, PA  Medication   0.9 %  sodium chloride infusion     Allergies:   Other, Iodine, Iohexol, and Statins   Social History   Socioeconomic History   Marital status: Married    Spouse name: John [name]   Number of children: 3   Years of education: Not on file   Highest education level: Not on file  Occupational History   Occupation: retired    Fish farm manager: RETIRED  Tobacco Use   Smoking status: Former    Packs/day: 1.00    Years: 12.00    Pack years: 12.00    Types: Cigarettes    Quit date: 07/06/1968    Years since quitting: 52.5   Smokeless tobacco: Never  Substance and Sexual Activity   Alcohol use: Yes    Comment: social use   Drug use: No   Sexual activity: Not on file  Other Topics Concern   Not on file  Social History Narrative   Not on file   Social Determinants of Health   Financial Resource Strain: Not on file  Food Insecurity: Not on file  Transportation Needs: Not on file  Physical Activity: Not on file  Stress: Not on file  Social Connections: Not on file     Family History: The patient's family history includes Heart attack in his father; Heart disease in his brother; Hypertension in his brother and mother; Prostate cancer in his brother; Stroke in his mother.  ROS:   Please see the history of present illness.     All other systems reviewed and are negative.  EKGs/Labs/Other Studies Reviewed:    The following studies were reviewed today:  Echo 04/12/2017 LV EF: 55% -   60%   -------------------------------------------------------------------  Indications:      I35.9 Aortic valve disorder.   -------------------------------------------------------------------  History:   PMH:   Mitral Valve repair. Bradycardia. Pedal edema.  Atrial fibrillation.  Coronary artery disease.   -------------------------------------------------------------------  Study Conclusions   - Left ventricle: The cavity size was normal. Wall thickness was    increased in a pattern of mild LVH. Systolic function was normal.    The estimated ejection fraction was in the range of 55% to 60%.  - Mitral valve: S/P MV repair. MV is thickened with mildly  restricted motion. Peak and mean gradients through the valve are    16 an 5 mm Hg respectively. MVA by P T1/2 is 1.3 cm2. Moderately    calcified annulus. There was mild regurgitation. Valve area by    pressure half-time: 1.29 cm^2. Valve area by continuity equation    (using LVOT flow): 1.09 cm^2.  - Left atrium: The atrium was moderately dilated.  - Right ventricle: The cavity size was mildly dilated. Wall    thickness was normal.  - Pulmonary arteries: PA peak pressure: 43 mm Hg (S).   EKG:  EKG is not ordered today.    Recent Labs: No results found for requested labs within last 8760 hours.  Recent Lipid Panel    Component Value Date/Time   CHOL 163 08/09/2019 0903   TRIG 58 08/09/2019 0903   HDL 69 08/09/2019 0903   CHOLHDL 2.4 08/09/2019 0903   CHOLHDL 3 09/09/2017 0947   VLDL 14.4 09/09/2017 0947   LDLCALC 82 08/09/2019 0903   LDLDIRECT 155.5 03/11/2011 0828     Risk Assessment/Calculations:    CHA2DS2-VASc Score = 4  This indicates a 4.8% annual risk of stroke. The patient's score is based upon: CHF History: No HTN History: Yes Diabetes History: No Stroke History: No Vascular Disease History: Yes Age Score: 2 Gender Score: 0         Physical Exam:    VS:  BP 130/72   Pulse 72   Ht 5\' 9"  (1.753 m)   Wt 170 lb 9.6 oz (77.4 kg)   SpO2 98%   BMI 25.19 kg/m     Wt Readings from Last 3 Encounters:  01/13/21 170 lb 9.6 oz (77.4 kg)  07/09/20 173 lb 3.2 oz (78.6 kg)  01/09/20 177 lb 9.6 oz (80.6 kg)      GEN:  Well nourished, well developed in no acute distress HEENT: Normal NECK: No JVD; No carotid bruits LYMPHATICS: No lymphadenopathy CARDIAC: RRR, no murmurs, rubs, gallops RESPIRATORY:  Clear to auscultation without rales, wheezing or rhonchi  ABDOMEN: Soft, non-tender, non-distended MUSCULOSKELETAL:  No edema; No deformity  SKIN: Warm and dry NEUROLOGIC:  Alert and oriented x 3 PSYCHIATRIC:  Normal affect   ASSESSMENT:    1. Coronary artery disease involving coronary bypass graft of native heart without angina pectoris   2. Bilateral carotid bruits   3. Amiodarone toxicity, undetermined intent, sequela   4. H/O mitral valve repair   5. Hyperlipidemia LDL goal <70   6. PAF (paroxysmal atrial fibrillation) (HCC)    PLAN:    In order of problems listed above:  CAD status post CABG: Denies any recent chest pain  Carotid bruit: Obtain carotid artery Doppler  Amiodarone use: Obtain annual chest x-ray.  PCP to obtain TSH and liver function test  Mitral valve repair: Stable on last echocardiogram  Hyperlipidemia: On pravastatin  PAF: Continue amiodarone and Coumadin      Medication Adjustments/Labs and Tests Ordered: Current medicines are reviewed at length with the patient today.  Concerns regarding medicines are outlined above.  Orders Placed This Encounter  Procedures   DG Chest 2 View   VAS US CAROTID   No orders of the defined types were placed in this encounter.   Patient Instructions  Medication Instructions:  Your physician recommends that you continue on your current medications as directed. Please refer to the Current Medication list given to you today.  *If you need a refill on your cardiac medications before your next appointment,  please call your pharmacy*  Lab Work: NONE ordered at this time of appointment   If you have labs (blood work) drawn today and your tests are completely normal, you will receive your results only by: [place] (if  you have MyChart) OR A paper copy in the mail If you have any lab test that is abnormal or we need to change your treatment, we will call you to review the results.  Testing/Procedures: Your physician has requested that you have a carotid duplex. This test is an ultrasound of the carotid arteries in your neck. It looks at blood flow through these arteries that supply the brain with blood. Allow one hour for this exam. There are no restrictions or special instructions.  Please schedule for 1 month   A chest x-ray takes a picture of the organs and structures inside the chest, including the heart, lungs, and blood vessels. This test can show several things, including, whether the heart is enlarges; whether fluid is building up in the lungs; and whether pacemaker / defibrillator leads are still in place.  Please have performed within a month   Follow-Up: At [facility], you and your health needs are our priority.  As part of our continuing mission to provide you with exceptional heart care, we have created designated Provider Care Teams.  These Care Teams include your primary Cardiologist (physician) and Advanced Practice Providers (APPs -  Physician Assistants and Nurse Practitioners) who all work together to provide you with the care you need, when you need it.  We recommend signing up for the patient portal called "MyChart".  Sign up information is provided on this After Visit Summary.  MyChart is used to connect with patients for Virtual Visits (Telemedicine).  Patients are able to view lab/test results, encounter notes, upcoming appointments, etc.  Non-urgent messages can be sent to your provider as well.   To learn more about what you can do with MyChart, go to [email or web address].    Your next appointment:   6 month(s)  The format for your next appointment:   In Person  Provider:   [name], MD  Other Instructions    Signed, John [name], [place]  01/15/2021 [place]

## 2021-01-13 NOTE — Patient Instructions (Signed)
Medication Instructions:  Your physician recommends that you continue on your current medications as directed. Please refer to the Current Medication list given to you today.  *If you need a refill on your cardiac medications before your next appointment, please call your pharmacy*  Lab Work: NONE ordered at this time of appointment   If you have labs (blood work) drawn today and your tests are completely normal, you will receive your results only by: [place] (if you have MyChart) OR A paper copy in the mail If you have any lab test that is abnormal or we need to change your treatment, we will call you to review the results.  Testing/Procedures: Your physician has requested that you have a carotid duplex. This test is an ultrasound of the carotid arteries in your neck. It looks at blood flow through these arteries that supply the brain with blood. Allow one hour for this exam. There are no restrictions or special instructions.  Please schedule for 1 month   A chest x-ray takes a picture of the organs and structures inside the chest, including the heart, lungs, and blood vessels. This test can show several things, including, whether the heart is enlarges; whether fluid is building up in the lungs; and whether pacemaker / defibrillator leads are still in place.  Please have performed within a month   Follow-Up: At [facility], you and your health needs are our priority.  As part of our continuing mission to provide you with exceptional heart care, we have created designated Provider Care Teams.  These Care Teams include your primary Cardiologist (physician) and Advanced Practice Providers (APPs -  Physician Assistants and Nurse Practitioners) who all work together to provide you with the care you need, when you need it.  We recommend signing up for the patient portal called "MyChart".  Sign up information is provided on this After Visit Summary.  MyChart is used to connect with  patients for Virtual Visits (Telemedicine).  Patients are able to view lab/test results, encounter notes, upcoming appointments, etc.  Non-urgent messages can be sent to your provider as well.   To learn more about what you can do with MyChart, go to [email or web address].    Your next appointment:   6 month(s)  The format for your next appointment:   In Person  Provider:   [name], MD  Other Instructions

## 2021-01-15 ENCOUNTER — Encounter: Payer: Self-pay | Admitting: Physician Assistant

## 2021-01-22 DIAGNOSIS — H26492 Other secondary cataract, left eye: Secondary | ICD-10-CM | POA: Diagnosis not present

## 2021-01-27 ENCOUNTER — Ambulatory Visit
Admission: RE | Admit: 2021-01-27 | Discharge: 2021-01-27 | Disposition: A | Discharge status: Home / Self Care | Payer: Medicare Other | Source: Ambulatory Visit | Attending: Physician Assistant | Admitting: Physician Assistant

## 2021-01-27 ENCOUNTER — Other Ambulatory Visit: Payer: Self-pay

## 2021-01-27 DIAGNOSIS — Z79899 Other long term (current) drug therapy: Secondary | ICD-10-CM | POA: Diagnosis not present

## 2021-01-27 DIAGNOSIS — T462X4S Poisoning by other antidysrhythmic drugs, undetermined, sequela: Secondary | ICD-10-CM

## 2021-01-27 DIAGNOSIS — Z951 Presence of aortocoronary bypass graft: Secondary | ICD-10-CM | POA: Diagnosis not present

## 2021-01-27 DIAGNOSIS — Z952 Presence of prosthetic heart valve: Secondary | ICD-10-CM | POA: Diagnosis not present

## 2021-01-28 ENCOUNTER — Encounter: Payer: Self-pay | Admitting: *Deleted

## 2021-02-19 ENCOUNTER — Ambulatory Visit (INDEPENDENT_AMBULATORY_CARE_PROVIDER_SITE_OTHER): Payer: Medicare Other

## 2021-02-19 ENCOUNTER — Other Ambulatory Visit: Payer: Self-pay

## 2021-02-19 ENCOUNTER — Ambulatory Visit (HOSPITAL_COMMUNITY)
Admission: RE | Admit: 2021-02-19 | Discharge: 2021-02-19 | Disposition: A | Discharge status: Home / Self Care | Payer: Medicare Other | Source: Ambulatory Visit | Attending: Cardiology | Admitting: Cardiology

## 2021-02-19 ENCOUNTER — Other Ambulatory Visit (HOSPITAL_COMMUNITY): Payer: Self-pay | Admitting: Physician Assistant

## 2021-02-19 DIAGNOSIS — I480 Paroxysmal atrial fibrillation: Secondary | ICD-10-CM

## 2021-02-19 DIAGNOSIS — Z5181 Encounter for therapeutic drug level monitoring: Secondary | ICD-10-CM

## 2021-02-19 DIAGNOSIS — I6523 Occlusion and stenosis of bilateral carotid arteries: Secondary | ICD-10-CM

## 2021-02-19 DIAGNOSIS — R0989 Other specified symptoms and signs involving the circulatory and respiratory systems: Secondary | ICD-10-CM | POA: Insufficient documentation

## 2021-02-19 LAB — POCT INR: INR: 1.9 — AB (ref 2.0–3.0)

## 2021-02-19 NOTE — Patient Instructions (Signed)
Take 2 tablets tonight only and then Continue taking warfarin '1mg'$  (1 tablet) daily. Next INR in 8 wks

## 2021-02-20 ENCOUNTER — Other Ambulatory Visit: Payer: Self-pay

## 2021-02-20 DIAGNOSIS — R0989 Other specified symptoms and signs involving the circulatory and respiratory systems: Secondary | ICD-10-CM

## 2021-02-20 NOTE — Progress Notes (Signed)
Mild disease observed in the carotid artery. Repeat study in 1 year

## 2021-03-12 ENCOUNTER — Other Ambulatory Visit: Payer: Self-pay | Admitting: Cardiology

## 2021-03-27 ENCOUNTER — Other Ambulatory Visit: Payer: Self-pay | Admitting: Cardiology

## 2021-03-27 DIAGNOSIS — I4891 Unspecified atrial fibrillation: Secondary | ICD-10-CM

## 2021-04-16 ENCOUNTER — Ambulatory Visit (INDEPENDENT_AMBULATORY_CARE_PROVIDER_SITE_OTHER): Payer: Medicare Other

## 2021-04-16 ENCOUNTER — Ambulatory Visit (INDEPENDENT_AMBULATORY_CARE_PROVIDER_SITE_OTHER): Payer: Medicare Other | Admitting: Ophthalmology

## 2021-04-16 ENCOUNTER — Encounter (INDEPENDENT_AMBULATORY_CARE_PROVIDER_SITE_OTHER): Payer: Self-pay | Admitting: Ophthalmology

## 2021-04-16 ENCOUNTER — Other Ambulatory Visit: Payer: Self-pay

## 2021-04-16 DIAGNOSIS — Z8669 Personal history of other diseases of the nervous system and sense organs: Secondary | ICD-10-CM

## 2021-04-16 DIAGNOSIS — Z5181 Encounter for therapeutic drug level monitoring: Secondary | ICD-10-CM | POA: Diagnosis not present

## 2021-04-16 DIAGNOSIS — H33011 Retinal detachment with single break, right eye: Secondary | ICD-10-CM

## 2021-04-16 DIAGNOSIS — H35371 Puckering of macula, right eye: Secondary | ICD-10-CM | POA: Diagnosis not present

## 2021-04-16 DIAGNOSIS — H35351 Cystoid macular degeneration, right eye: Secondary | ICD-10-CM

## 2021-04-16 DIAGNOSIS — I6523 Occlusion and stenosis of bilateral carotid arteries: Secondary | ICD-10-CM

## 2021-04-16 DIAGNOSIS — I480 Paroxysmal atrial fibrillation: Secondary | ICD-10-CM | POA: Diagnosis not present

## 2021-04-16 LAB — POCT INR: INR: 2.3 (ref 2.0–3.0)

## 2021-04-16 NOTE — Patient Instructions (Signed)
Continue taking warfarin 1mg  (1 tablet) daily. Next INR in 8 wks

## 2021-04-16 NOTE — Assessment & Plan Note (Signed)
No new findings, retina attached

## 2021-04-16 NOTE — Progress Notes (Signed)
04/16/2021     CHIEF COMPLAINT Patient presents for  Chief Complaint  Patient presents with   Retina Follow Up      HISTORY OF PRESENT ILLNESS: John [name] is a 85 y.o. male who presents to the clinic today for:   HPI     Retina Follow Up   Patient presents with  Other.  In both eyes.  This started 9 months ago.  Severity is mild.  Duration of 9 months.  Since onset it is stable.        Comments   9 month fu OU and OCT/FP Pt states, "My right eye is pretty much gone. I do not see much out of it and it is very distorted what I do see. I wear a patch over it most of the time because it makes my balance bad. The left eye is ok."       Last edited by Kendra Opitz, COA on 04/16/2021  9:10 AM.      Referring physician: Deland Pretty, MD [address],  [place] [address]  HISTORICAL INFORMATION:   Selected notes from the MEDICAL RECORD NUMBER    Lab Results  Component Value Date   HGBA1C  07/22/2007    5.7 (NOTE)   The ADA recommends the following therapeutic goals for glycemic   control related to Hgb A1C measurement:   Goal of Therapy:   < 7.0% Hgb A1C   Action Suggested:  > 8.0% Hgb A1C   Ref:  Diabetes Care, 22, Suppl. 1, 1999     CURRENT MEDICATIONS: No current outpatient medications on file. (Ophthalmic Drugs)   No current facility-administered medications for this visit. (Ophthalmic Drugs)   Current Outpatient Medications (Other)  Medication Sig   amiodarone (PACERONE) 200 MG tablet TAKE 1 TABLET BY MOUTH  DAILY   amLODipine (NORVASC) 5 MG tablet TAKE 1 TABLET BY MOUTH  DAILY   doxazosin (CARDURA) 8 MG tablet TAKE 1 TABLET BY MOUTH AT  BEDTIME   ezetimibe (ZETIA) 10 MG tablet TAKE 1 TABLET BY MOUTH  DAILY   furosemide (LASIX) 20 MG tablet Take 20 mg by mouth daily as needed.   levothyroxine (SYNTHROID) 50 MCG tablet TAKE 1 TABLET BY MOUTH  DAILY BEFORE BREAKFAST   losartan (COZAAR) 100 MG tablet TAKE 1 TABLET BY MOUTH  DAILY    pravastatin (PRAVACHOL) 20 MG tablet TAKE 1 TABLET BY MOUTH  DAILY   Vitamin D, Ergocalciferol, (DRISDOL) 50000 units CAPS capsule Take 1 capsule (50,000 Units total) by mouth every 7 (seven) days.   warfarin (COUMADIN) 1 MG tablet TAKE 1 TABLET BY MOUTH  DAILY OR AS DIRECTED BY  COUMADIN CLINIC   Current Facility-Administered Medications (Other)  Medication Route   0.9 %  sodium chloride infusion Intravenous      REVIEW OF SYSTEMS:    ALLERGIES Allergies  Allergen Reactions   Other Other (See Comments)   Iodine Hives   Iohexol Hives     PT ALLERGIC TO IV CONTRAST;(REACTION DURING CARDIAC CATHETERIZATION); NEEDS PREMEDS PER DR CRENSHAW!, Onset Date: [id number]    Statins Other (See Comments)    Joint aches with Zocor, Lipitor, Crestor    PAST MEDICAL HISTORY Past Medical History:  Diagnosis Date   Anxiety    Atrial fibrillation (HCC)    CAD (coronary artery disease)    s/p CABG   Carotid stenosis    mild by doppler   Complication of anesthesia    zenkers  diverticulum   Deep vein thrombophlebitis of leg (HCC)    DJD (degenerative joint disease)    Elevated TSH    secondary to Amio-normal free T4.    Essential hypertension, benign    GERD (gastroesophageal reflux disease)    Hemorrhoids    Hiatal hernia    Hyperlipidemia    PAF (paroxysmal atrial fibrillation) (HCC)    Amiodarone   PE (pulmonary embolism)    pre 2007. Chronic anticoagulation   Prostate cancer ([place])    Sebaceous cyst    neck   Seborrheic dermatitis    Tubular adenoma of colon 05/2011   Past Surgical History:  Procedure Laterality Date   bilateral shoulder surgery     CORONARY ARTERY BYPASS GRAFT     x2    2009   I & D EXTREMITY  07/09/2011   Procedure: IRRIGATION AND DEBRIDEMENT EXTREMITY;  Surgeon: Tennis Must;  Location: [place];  Service: Orthopedics;  Laterality: Left;   left knee arthroscopy     MITRAL VALVE REPAIR     prostate cancer surgery  11/1996   radial  retropubic prostatectomy   TANDEM AND OVOID INSERTION     VASECTOMY     ZENKER'S DIVERTICULECTOMY N/A 11/07/2012   Procedure: ZENKER'S DIVERTICULECTOMY ENDOSCOPIC;  Surgeon: Melida Quitter, MD;  Location: [facility] OR;  Service: ENT;  Laterality: N/A;    FAMILY HISTORY Family History  Problem Relation Age of Onset   Heart attack Father    Stroke Mother    Hypertension Mother    Hypertension Brother    Heart disease Brother    Prostate cancer Brother     SOCIAL HISTORY Social History   Tobacco Use   Smoking status: Former    Packs/day: 1.00    Years: 12.00    Pack years: 12.00    Types: Cigarettes    Quit date: 07/06/1968    Years since quitting: 52.8   Smokeless tobacco: Never  Substance Use Topics   Alcohol use: Yes    Comment: social use   Drug use: No         OPHTHALMIC EXAM:  Base Eye Exam     Visual Acuity (ETDRS)       Right Left   Dist [place] 20/100 20/20    Correction: Glasses         Tonometry (Tonopen, 9:14 AM)       Right Left   Pressure 13 11         Pupils       Pupils Dark Light Shape React APD   Right PERRL 4 4 Round Minimal None   Left PERRL 4 4 Round Minimal None         Visual Fields       Left Right    Full    Restrictions  Total superior temporal, superior nasal deficiencies; Partial outer inferior temporal deficiency         Extraocular Movement       Right Left    Full Full         Neuro/Psych     Oriented x3: Yes   Mood/Affect: Normal         Dilation     Both eyes: 1.0% Mydriacyl, 2.5% Phenylephrine @ 9:14 AM           Slit Lamp and Fundus Exam     External Exam       Right Left   External Normal Normal  Slit Lamp Exam       Right Left   Lids/Lashes Normal Normal   Conjunctiva/Sclera White and quiet White and quiet   Cornea Clear Clear   Anterior Chamber Deep and quiet Deep and quiet   Iris Round and reactive Round and reactive   Lens Posterior chamber intraocular lens Posterior  chamber intraocular lens   Anterior Vitreous Vitrectomized, clear Normal         Fundus Exam       Right Left   Posterior Vitreous Vitrectomized, clear Central vitreous floaters, Posterior vitreous detachment   Disc Peripapillary atrophy Normal   C/D Ratio 0.7 0.4   Macula Old chorioretinal scars.  Detached Normal   Vessels Normal Normal   Periphery Old retinopexy laser scars attached 360 Normal, no holes            IMAGING AND PROCEDURES  Imaging and Procedures for 04/16/21  POCT INR      Component Value Flag Ref Range Units Status   INR 2.3      2.0 - 3.0  Final           OCT, Retina - OU - Both Eyes       Right Eye Quality was good. Scan locations included subfoveal. Central Foveal Thickness: 388. Progression has been stable. Findings include cystoid macular edema, epiretinal membrane.   Left Eye Quality was good. Scan locations included subfoveal. Central Foveal Thickness: 315. Progression has been stable. Findings include normal foveal contour.   Notes OD, with chronic CME OD as a residual of previous retinal detachment.  There is a minor epiretinal membrane,  Nasally. with limited visual potential, will observe  OS is normal     Color Fundus Photography Optos - OU - Both Eyes       Right Eye Progression has improved. Disc findings include increased cup to disc ratio.   Left Eye Progression has improved. Disc findings include normal observations. Macula : normal observations. Vessels : normal observations. Periphery : normal observations.   Notes History of complex retinal detachment and its repair right eye with multifocal chorioretinal scars.  Macula is flat and attached.  Some optic atrophy is noted.  Media is clear.  Left eye with vitreous debris, retinas attached medial clear vessels normal             ASSESSMENT/PLAN:  Retinal detachment of right eye with single break No new findings, retina attached  Epiretinal membrane (ERM)  of right eye Minor nasal to FAZ      ICD-10-CM   1. Cystoid macular edema of right eye  H35.351 OCT, Retina - OU - Both Eyes    Color Fundus Photography Optos - OU - Both Eyes    2. History of retinal detachment  Z86.69     3. Retinal detachment of right eye with single break  H33.011     4. Epiretinal membrane (ERM) of right eye  H35.371       1.  OD history of complex retinal detachment and multiple repair.  A attached yet still with minor epiretinal membrane nasal to FAZ and chronic CME centrally accounting for acuity as well as a slight displacement inferior of the macula.  Observe  2.  OS remained stable  3.  Ophthalmic Meds Ordered this visit:  No orders of the defined types were placed in this encounter.      Return in about 9 months (around 01/14/2022) for DILATE OU, COLOR FP, OCT.  There are no  Patient Instructions on file for this visit.   Explained the diagnoses, plan, and follow up with the patient and they expressed understanding.  Patient expressed understanding of the importance of proper follow up care.   Clent Demark Robie Mcniel M.D. Diseases & Surgery of the Retina and Vitreous Retina & Diabetic [place] 04/16/21     Abbreviations: M myopia (nearsighted); A astigmatism; H hyperopia (farsighted); P presbyopia; Mrx spectacle prescription;  CTL contact lenses; OD right eye; OS left eye; OU both eyes  XT exotropia; ET esotropia; PEK punctate epithelial keratitis; PEE punctate epithelial erosions; DES dry eye syndrome; MGD meibomian gland dysfunction; ATs artificial tears; PFAT's preservative free artificial tears; [place] nuclear sclerotic cataract; PSC posterior subcapsular cataract; ERM epi-retinal membrane; PVD posterior vitreous detachment; RD retinal detachment; DM diabetes mellitus; DR diabetic retinopathy; NPDR non-proliferative diabetic retinopathy; PDR proliferative diabetic retinopathy; CSME clinically significant macular edema; DME diabetic macular edema; dbh  dot blot hemorrhages; CWS cotton wool spot; POAG primary open angle glaucoma; C/D cup-to-disc ratio; HVF humphrey visual field; GVF goldmann visual field; OCT optical coherence tomography; IOP intraocular pressure; BRVO Branch retinal vein occlusion; CRVO central retinal vein occlusion; CRAO central retinal artery occlusion; BRAO branch retinal artery occlusion; RT retinal tear; SB scleral buckle; PPV pars plana vitrectomy; VH Vitreous hemorrhage; PRP panretinal laser photocoagulation; IVK intravitreal kenalog; VMT vitreomacular traction; MH Macular hole;  NVD neovascularization of the disc; NVE neovascularization elsewhere; AREDS age related eye disease study; ARMD age related macular degeneration; POAG primary open angle glaucoma; EBMD epithelial/anterior basement membrane dystrophy; ACIOL anterior chamber intraocular lens; IOL intraocular lens; PCIOL posterior chamber intraocular lens; Phaco/IOL phacoemulsification with intraocular lens placement; [place] photorefractive keratectomy; LASIK laser assisted in situ keratomileusis; HTN hypertension; DM diabetes mellitus; COPD chronic obstructive pulmonary disease

## 2021-04-16 NOTE — Assessment & Plan Note (Signed)
Minor nasal to FAZ

## 2021-04-22 DIAGNOSIS — M810 Age-related osteoporosis without current pathological fracture: Secondary | ICD-10-CM | POA: Diagnosis not present

## 2021-05-29 ENCOUNTER — Other Ambulatory Visit: Payer: Self-pay | Admitting: Cardiology

## 2021-05-29 DIAGNOSIS — I10 Essential (primary) hypertension: Secondary | ICD-10-CM

## 2021-06-04 NOTE — Progress Notes (Signed)
HPI: FU coronary artery disease status post coronary bypassing graft, as well as mitral valve repair in January 2009. The patient had an Myoview performed on July 12, 2008. This was normal with an ejection fraction of 66%. Pt also with h/o atrial fibrillation and on amiodarone. Echo repeated 10/18 and showed normal LV function; s/p MV repair; mean gradient 5 mmHg and mild MR; moderate LAE; mild RVE. ABIs July 2019 normal. Carotid Dopplers 8/22 showed 1 to 39% bilateral stenosis. Since last seen, patient notes increased dyspnea on exertion.  No orthopnea, PND or pedal edema.  He also states he occasionally has pain in his neck when he ambulates that resolves with rest.  This is particularly true when he raises his arms above his head.  He denies palpitations or syncope.  He notes pain in his hips bilaterally with ambulation relieved with rest.  Current Outpatient Medications  Medication Sig Dispense Refill   amiodarone (PACERONE) 200 MG tablet TAKE 1 TABLET BY MOUTH  DAILY 90 tablet 3   amLODipine (NORVASC) 5 MG tablet TAKE 1 TABLET BY MOUTH  DAILY 90 tablet 3   doxazosin (CARDURA) 8 MG tablet TAKE 1 TABLET BY MOUTH AT  BEDTIME 90 tablet 3   ezetimibe (ZETIA) 10 MG tablet TAKE 1 TABLET BY MOUTH  DAILY 90 tablet 0   furosemide (LASIX) 20 MG tablet Take 20 mg by mouth daily as needed.     levothyroxine (SYNTHROID) 50 MCG tablet TAKE 1 TABLET BY MOUTH  DAILY BEFORE BREAKFAST 90 tablet 3   losartan (COZAAR) 100 MG tablet TAKE 1 TABLET BY MOUTH  DAILY 90 tablet 3   pravastatin (PRAVACHOL) 20 MG tablet TAKE 1 TABLET BY MOUTH  DAILY 90 tablet 3   Vitamin D, Ergocalciferol, (DRISDOL) 50000 units CAPS capsule Take 1 capsule (50,000 Units total) by mouth every 7 (seven) days. 12 capsule 4   warfarin (COUMADIN) 1 MG tablet TAKE 1 TABLET BY MOUTH  DAILY OR AS DIRECTED BY  COUMADIN CLINIC 90 tablet 3   Current Facility-Administered Medications  Medication Dose Route Frequency Provider Last Rate Last  Admin   0.9 %  sodium chloride infusion  500 mL Intravenous Continuous Ladene Artist, MD         Past Medical History:  Diagnosis Date   Anxiety    Atrial fibrillation (HCC)    CAD (coronary artery disease)    s/p CABG   Carotid stenosis    mild by doppler   Complication of anesthesia    zenkers  diverticulum   Deep vein thrombophlebitis of leg (HCC)    DJD (degenerative joint disease)    Elevated TSH    secondary to Amio-normal free T4.    Essential hypertension, benign    GERD (gastroesophageal reflux disease)    Hemorrhoids    Hiatal hernia    Hyperlipidemia    PAF (paroxysmal atrial fibrillation) (HCC)    Amiodarone   PE (pulmonary embolism)    pre 2007. Chronic anticoagulation   Prostate cancer ([place])    Sebaceous cyst    neck   Seborrheic dermatitis    Tubular adenoma of colon 05/2011    Past Surgical History:  Procedure Laterality Date   bilateral shoulder surgery     CORONARY ARTERY BYPASS GRAFT     x2    2009   I & D EXTREMITY  07/09/2011   Procedure: IRRIGATION AND DEBRIDEMENT EXTREMITY;  Surgeon: Tennis Must;  Location: [place];  Service: Orthopedics;  Laterality: Left;   left knee arthroscopy     MITRAL VALVE REPAIR     prostate cancer surgery  11/1996   radial retropubic prostatectomy   TANDEM AND OVOID INSERTION     VASECTOMY     ZENKER'S DIVERTICULECTOMY N/A 11/07/2012   Procedure: ZENKER'S DIVERTICULECTOMY ENDOSCOPIC;  Surgeon: Melida Quitter, MD;  Location: [facility] OR;  Service: ENT;  Laterality: N/A;    Social History   Socioeconomic History   Marital status: Married    Spouse name: margaret   Number of children: 3   Years of education: Not on file   Highest education level: Not on file  Occupational History   Occupation: retired    Fish farm manager: RETIRED  Tobacco Use   Smoking status: Former    Packs/day: 1.00    Years: 12.00    Pack years: 12.00    Types: Cigarettes    Quit date: 07/06/1968    Years since quitting: 52.9    Smokeless tobacco: Never  Substance and Sexual Activity   Alcohol use: Yes    Comment: social use   Drug use: No   Sexual activity: Not on file  Other Topics Concern   Not on file  Social History Narrative   Not on file   Social Determinants of Health   Financial Resource Strain: Not on file  Food Insecurity: Not on file  Transportation Needs: Not on file  Physical Activity: Not on file  Stress: Not on file  Social Connections: Not on file  Intimate Partner Violence: Not on file    Family History  Problem Relation Age of Onset   Heart attack Father    Stroke Mother    Hypertension Mother    Hypertension Brother    Heart disease Brother    Prostate cancer Brother     ROS: no fevers or chills, productive cough, hemoptysis, dysphasia, odynophagia, melena, hematochezia, dysuria, hematuria, rash, seizure activity, orthopnea, PND, pedal edema, claudication. Remaining systems are negative.  Physical Exam: Well-developed well-nourished in no acute distress.  Skin is warm and dry.  HEENT is normal.  Neck is supple.  Chest is clear to auscultation with normal expansion.  Cardiovascular exam is regular rate and rhythm.  Abdominal exam nontender or distended. No masses palpated. Extremities show no edema. neuro grossly intact  ECG-normal sinus rhythm at a rate of 71, normal axis, no ST changes.  Personally reviewed  A/P  1 paroxysmal atrial fibrillation-patient remains in sinus rhythm.  We will continue amiodarone.  Check TSH, liver functions.  Recent chest x-ray without amiodarone toxicity.  Continue Coumadin with goal INR 2-3.  Check hemoglobin.  2 hypertension-blood pressure controlled.  Continue present medical regimen and follow.  3 mitral valve repair-continue SBE prophylaxis.  Patient notes increased dyspnea on exertion.  We will repeat echocardiogram.  4 history of carotid artery disease-mild on most recent Dopplers.  5 coronary disease-continue statin.  He has  some discomfort in his neck with ambulation relieved with rest.  I will arrange a [place] nuclear study to screen for ischemia.  6 hyperlipidemia-we will continue present medications.  He did not tolerate high-dose statins previously.  Check lipids and liver.  7 question claudication-he describes some pain in his hips with ambulation relieved with rest.  I will arrange ABIs with Doppler to screen for peripheral vascular disease.  [name], MD

## 2021-06-10 ENCOUNTER — Encounter: Payer: Self-pay | Admitting: Cardiology

## 2021-06-10 ENCOUNTER — Ambulatory Visit (INDEPENDENT_AMBULATORY_CARE_PROVIDER_SITE_OTHER): Payer: Medicare Other | Admitting: Cardiology

## 2021-06-10 ENCOUNTER — Ambulatory Visit (INDEPENDENT_AMBULATORY_CARE_PROVIDER_SITE_OTHER): Payer: Medicare Other | Admitting: *Deleted

## 2021-06-10 ENCOUNTER — Other Ambulatory Visit: Payer: Self-pay

## 2021-06-10 VITALS — BP 156/60 | HR 71 | Ht 69.0 in | Wt 167.6 lb

## 2021-06-10 DIAGNOSIS — I739 Peripheral vascular disease, unspecified: Secondary | ICD-10-CM

## 2021-06-10 DIAGNOSIS — Z5181 Encounter for therapeutic drug level monitoring: Secondary | ICD-10-CM | POA: Diagnosis not present

## 2021-06-10 DIAGNOSIS — R072 Precordial pain: Secondary | ICD-10-CM

## 2021-06-10 DIAGNOSIS — I25810 Atherosclerosis of coronary artery bypass graft(s) without angina pectoris: Secondary | ICD-10-CM

## 2021-06-10 DIAGNOSIS — R0609 Other forms of dyspnea: Secondary | ICD-10-CM | POA: Diagnosis not present

## 2021-06-10 DIAGNOSIS — Z9889 Other specified postprocedural states: Secondary | ICD-10-CM

## 2021-06-10 DIAGNOSIS — I480 Paroxysmal atrial fibrillation: Secondary | ICD-10-CM

## 2021-06-10 DIAGNOSIS — I6523 Occlusion and stenosis of bilateral carotid arteries: Secondary | ICD-10-CM | POA: Diagnosis not present

## 2021-06-10 LAB — CBC
Hematocrit: 36.6 % — ABNORMAL LOW (ref 37.5–51.0)
Hemoglobin: 12.3 g/dL — ABNORMAL LOW (ref 13.0–17.7)
MCH: 28.6 pg (ref 26.6–33.0)
MCHC: 33.6 g/dL (ref 31.5–35.7)
MCV: 85 fL (ref 79–97)
Platelets: 208 10*3/uL (ref 150–450)
RBC: 4.3 x10E6/uL (ref 4.14–5.80)
RDW: 13.8 % (ref 11.6–15.4)
WBC: 6.1 10*3/uL (ref 3.4–10.8)

## 2021-06-10 LAB — LIPID PANEL
Chol/HDL Ratio: 2.5 ratio (ref 0.0–5.0)
Cholesterol, Total: 185 mg/dL (ref 100–199)
HDL: 74 mg/dL (ref >39)
LDL Chol Calc (NIH): 100 mg/dL — ABNORMAL HIGH (ref 0–99)
Triglycerides: 60 mg/dL (ref 0–149)
VLDL Cholesterol Cal: 11 mg/dL (ref 5–40)

## 2021-06-10 LAB — COMPREHENSIVE METABOLIC PANEL
ALT: 22 IU/L (ref 0–44)
AST: 26 IU/L (ref 0–40)
Albumin/Globulin Ratio: 2.3 — ABNORMAL HIGH (ref 1.2–2.2)
Albumin: 4.5 g/dL (ref 3.6–4.6)
Alkaline Phosphatase: 112 IU/L (ref 44–121)
BUN/Creatinine Ratio: 25 — ABNORMAL HIGH (ref 10–24)
BUN: 34 mg/dL — ABNORMAL HIGH (ref 8–27)
Bilirubin Total: 0.5 mg/dL (ref 0.0–1.2)
CO2: 19 mmol/L — ABNORMAL LOW (ref 20–29)
Calcium: 8.7 mg/dL (ref 8.6–10.2)
Chloride: 108 mmol/L — ABNORMAL HIGH (ref 96–106)
Creatinine, Ser: 1.38 mg/dL — ABNORMAL HIGH (ref 0.76–1.27)
Globulin, Total: 2 g/dL (ref 1.5–4.5)
Glucose: 95 mg/dL (ref 70–99)
Potassium: 4.6 mmol/L (ref 3.5–5.2)
Sodium: 142 mmol/L (ref 134–144)
Total Protein: 6.5 g/dL (ref 6.0–8.5)
eGFR: 51 mL/min/{1.73_m2} — ABNORMAL LOW (ref >59)

## 2021-06-10 LAB — POCT INR: INR: 2 (ref 2.0–3.0)

## 2021-06-10 LAB — TSH: TSH: 6.94 u[IU]/mL — ABNORMAL HIGH (ref 0.450–4.500)

## 2021-06-10 NOTE — Patient Instructions (Addendum)
Description    Take 1.5 tablets of warfarin today and then continue taking warfarin 1mg  (1 tablet) daily. Next INR in 6 wks. Coumadin Clinic [phone]

## 2021-06-10 NOTE — Patient Instructions (Signed)
  Lab Work:  Your physician recommends that you HAVE LAB WORK TODAY  If you have labs (blood work) drawn today and your tests are completely normal, you will receive your results only by: MyChart Message (if you have MyChart) OR A paper copy in the mail If you have any lab test that is abnormal or we need to change your treatment, we will call you to review the results.   Testing/Procedures:  Your physician has requested that you have an ankle brachial index (ABI). During this test an ultrasound and blood pressure cuff are used to evaluate the arteries that supply the arms and legs with blood. Allow thirty minutes for this exam. There are no restrictions or special instructions. [place] physician has requested that you have an echocardiogram. Echocardiography is a painless test that uses sound waves to create images of your heart. It provides your doctor with information about the size and shape of your heart and how well your heart's chambers and valves are working. This procedure takes approximately one hour. There are no restrictions for this procedure. [place] has requested that you have a lexiscan myoview. For further information please visit [email or web address]. Please follow instruction sheet, as given. [place]   Follow-Up: At [facility], you and your health needs are our priority.  As part of our continuing mission to provide you with exceptional heart care, we have created designated Provider Care Teams.  These Care Teams include your primary Cardiologist (physician) and Advanced Practice Providers (APPs -  Physician Assistants and Nurse Practitioners) who all work together to provide you with the care you need, when you need it.  We recommend signing up for the patient portal called "MyChart".  Sign up information is provided on this After Visit Summary.  MyChart is used to connect with patients for Virtual Visits  (Telemedicine).  Patients are able to view lab/test results, encounter notes, upcoming appointments, etc.  Non-urgent messages can be sent to your provider as well.   To learn more about what you can do with MyChart, go to [email or web address].    Your next appointment:   4 month(s)  The format for your next appointment:   In Person  Provider:   [name], MD

## 2021-06-16 ENCOUNTER — Telehealth: Payer: Self-pay | Admitting: Cardiology

## 2021-06-16 DIAGNOSIS — Z7901 Long term (current) use of anticoagulants: Secondary | ICD-10-CM

## 2021-06-16 DIAGNOSIS — E7800 Pure hypercholesterolemia, unspecified: Secondary | ICD-10-CM

## 2021-06-16 DIAGNOSIS — I25810 Atherosclerosis of coronary artery bypass graft(s) without angina pectoris: Secondary | ICD-10-CM

## 2021-06-16 DIAGNOSIS — I6522 Occlusion and stenosis of left carotid artery: Secondary | ICD-10-CM

## 2021-06-16 DIAGNOSIS — Z79899 Other long term (current) drug therapy: Secondary | ICD-10-CM

## 2021-06-16 DIAGNOSIS — E785 Hyperlipidemia, unspecified: Secondary | ICD-10-CM

## 2021-06-16 MED ORDER — PRAVASTATIN SODIUM 40 MG PO TABS: 40.0000 mg | ORAL_TABLET | Freq: Every day | ORAL | 1 refills | Status: DC

## 2021-06-16 NOTE — Telephone Encounter (Signed)
John Perla, MD  06/11/2021  7:27 AM EST     Increase p.o. fluid intake and hold Lasix; increase pravastatin to 40 mg daily.  Check lipids, liver, free T4 and bmet in 8 weeks. [name]  Pt informed of providers result & recommendations. Pt verbalized understanding. Pt will take 2 of the prava that he has until they are gone. No further questions . New rx entered. Future lab entered

## 2021-06-16 NOTE — Telephone Encounter (Signed)
Patient is returning call to discuss lab results. 

## 2021-06-18 ENCOUNTER — Telehealth (HOSPITAL_COMMUNITY): Payer: Self-pay | Admitting: *Deleted

## 2021-06-18 NOTE — Telephone Encounter (Signed)
Patient given detailed instructions per Myocardial Perfusion Study Information Sheet for the test on 06/25/21 at 0815. Patient notified to arrive 15 minutes early and that it is imperative to arrive on time for appointment to keep from having the test rescheduled.  If you need to cancel or reschedule your appointment, please call the office within 24 hours of your appointment. . Patient verbalized understanding.Edwen Mclester, Ranae [place] No mychart

## 2021-06-25 ENCOUNTER — Ambulatory Visit (HOSPITAL_COMMUNITY): Payer: Medicare Other | Attending: Cardiology

## 2021-06-25 ENCOUNTER — Other Ambulatory Visit: Payer: Self-pay

## 2021-06-25 DIAGNOSIS — R0609 Other forms of dyspnea: Secondary | ICD-10-CM | POA: Insufficient documentation

## 2021-06-25 LAB — MYOCARDIAL PERFUSION IMAGING
LV dias vol: 75 mL (ref 62–150)
LV sys vol: 27 mL
Nuc Stress EF: 63 %
Peak HR: 76 {beats}/min
Rest HR: 75 {beats}/min
Rest Nuclear Isotope Dose: 10.3 mCi
SDS: 0
SRS: 0
SSS: 0
ST Depression (mm): 0 mm
Stress Nuclear Isotope Dose: 31.4 mCi
TID: 1.03

## 2021-06-25 MED ORDER — TECHNETIUM TC 99M TETROFOSMIN IV KIT
31.4000 | PACK | Freq: Once | INTRAVENOUS | Status: AC | PRN
  Administered 2021-06-25: 31.4 via INTRAVENOUS
  Filled 2021-06-25: qty 32

## 2021-06-25 MED ORDER — REGADENOSON 0.4 MG/5ML IV SOLN
0.4000 mg | Freq: Once | INTRAVENOUS | Status: AC
  Administered 2021-06-25: 0.4 mg via INTRAVENOUS

## 2021-06-25 MED ORDER — TECHNETIUM TC 99M TETROFOSMIN IV KIT
10.3000 | PACK | Freq: Once | INTRAVENOUS | Status: AC | PRN
  Administered 2021-06-25: 10.3 via INTRAVENOUS
  Filled 2021-06-25: qty 11

## 2021-07-02 ENCOUNTER — Ambulatory Visit (HOSPITAL_COMMUNITY): Payer: Medicare Other | Attending: Cardiology

## 2021-07-02 ENCOUNTER — Other Ambulatory Visit: Payer: Self-pay

## 2021-07-02 DIAGNOSIS — I342 Nonrheumatic mitral (valve) stenosis: Secondary | ICD-10-CM

## 2021-07-02 DIAGNOSIS — R0609 Other forms of dyspnea: Secondary | ICD-10-CM | POA: Insufficient documentation

## 2021-07-02 DIAGNOSIS — Z9889 Other specified postprocedural states: Secondary | ICD-10-CM | POA: Insufficient documentation

## 2021-07-02 LAB — ECHOCARDIOGRAM COMPLETE
Area-P 1/2: 1.98 cm2
MV VTI: 1.09 cm2
S' Lateral: 2.8 cm

## 2021-07-04 ENCOUNTER — Ambulatory Visit (HOSPITAL_COMMUNITY)
Admission: RE | Admit: 2021-07-04 | Discharge: 2021-07-04 | Disposition: A | Discharge status: Home / Self Care | Payer: Medicare Other | Source: Ambulatory Visit | Attending: Internal Medicine | Admitting: Internal Medicine

## 2021-07-04 ENCOUNTER — Other Ambulatory Visit: Payer: Self-pay

## 2021-07-04 DIAGNOSIS — I739 Peripheral vascular disease, unspecified: Secondary | ICD-10-CM | POA: Insufficient documentation

## 2021-07-23 ENCOUNTER — Other Ambulatory Visit: Payer: Self-pay

## 2021-07-23 ENCOUNTER — Ambulatory Visit (INDEPENDENT_AMBULATORY_CARE_PROVIDER_SITE_OTHER): Payer: Medicare Other

## 2021-07-23 DIAGNOSIS — Z7901 Long term (current) use of anticoagulants: Secondary | ICD-10-CM | POA: Diagnosis not present

## 2021-07-23 DIAGNOSIS — Z5181 Encounter for therapeutic drug level monitoring: Secondary | ICD-10-CM | POA: Diagnosis not present

## 2021-07-23 DIAGNOSIS — I25810 Atherosclerosis of coronary artery bypass graft(s) without angina pectoris: Secondary | ICD-10-CM | POA: Diagnosis not present

## 2021-07-23 DIAGNOSIS — I480 Paroxysmal atrial fibrillation: Secondary | ICD-10-CM | POA: Diagnosis not present

## 2021-07-23 DIAGNOSIS — I6522 Occlusion and stenosis of left carotid artery: Secondary | ICD-10-CM | POA: Diagnosis not present

## 2021-07-23 DIAGNOSIS — E785 Hyperlipidemia, unspecified: Secondary | ICD-10-CM | POA: Diagnosis not present

## 2021-07-23 DIAGNOSIS — E7800 Pure hypercholesterolemia, unspecified: Secondary | ICD-10-CM | POA: Diagnosis not present

## 2021-07-23 DIAGNOSIS — Z79899 Other long term (current) drug therapy: Secondary | ICD-10-CM | POA: Diagnosis not present

## 2021-07-23 LAB — LIPID PANEL
Chol/HDL Ratio: 2.1 ratio (ref 0.0–5.0)
Cholesterol, Total: 159 mg/dL (ref 100–199)
HDL: 74 mg/dL (ref >39)
LDL Chol Calc (NIH): 73 mg/dL (ref 0–99)
Triglycerides: 59 mg/dL (ref 0–149)
VLDL Cholesterol Cal: 12 mg/dL (ref 5–40)

## 2021-07-23 LAB — HEPATIC FUNCTION PANEL
ALT: 28 IU/L (ref 0–44)
AST: 27 IU/L (ref 0–40)
Albumin: 4.4 g/dL (ref 3.6–4.6)
Alkaline Phosphatase: 122 IU/L — ABNORMAL HIGH (ref 44–121)
Bilirubin Total: 0.4 mg/dL (ref 0.0–1.2)
Bilirubin, Direct: <0.1 mg/dL (ref 0.00–0.40)
Total Protein: 6.6 g/dL (ref 6.0–8.5)

## 2021-07-23 LAB — POCT INR: INR: 2.9 (ref 2.0–3.0)

## 2021-07-23 LAB — T4, FREE: Free T4: 1.8 ng/dL — ABNORMAL HIGH (ref 0.82–1.77)

## 2021-07-23 NOTE — Patient Instructions (Signed)
continue taking warfarin 1mg  (1 tablet) daily. Next INR in 6 wks. Coumadin Clinic [phone]

## 2021-07-24 ENCOUNTER — Encounter: Payer: Self-pay | Admitting: *Deleted

## 2021-07-24 ENCOUNTER — Other Ambulatory Visit: Payer: Self-pay | Admitting: *Deleted

## 2021-07-24 DIAGNOSIS — R946 Abnormal results of thyroid function studies: Secondary | ICD-10-CM

## 2021-07-24 DIAGNOSIS — Z5181 Encounter for therapeutic drug level monitoring: Secondary | ICD-10-CM

## 2021-07-24 NOTE — Progress Notes (Signed)
tsh

## 2021-08-17 ENCOUNTER — Other Ambulatory Visit: Payer: Self-pay | Admitting: Cardiology

## 2021-08-17 DIAGNOSIS — R7989 Other specified abnormal findings of blood chemistry: Secondary | ICD-10-CM

## 2021-08-19 ENCOUNTER — Other Ambulatory Visit: Payer: Self-pay | Admitting: Cardiology

## 2021-08-21 DIAGNOSIS — E039 Hypothyroidism, unspecified: Secondary | ICD-10-CM | POA: Diagnosis not present

## 2021-08-21 DIAGNOSIS — E782 Mixed hyperlipidemia: Secondary | ICD-10-CM | POA: Diagnosis not present

## 2021-08-21 DIAGNOSIS — I10 Essential (primary) hypertension: Secondary | ICD-10-CM | POA: Diagnosis not present

## 2021-09-03 ENCOUNTER — Other Ambulatory Visit: Payer: Self-pay

## 2021-09-03 ENCOUNTER — Ambulatory Visit (INDEPENDENT_AMBULATORY_CARE_PROVIDER_SITE_OTHER): Payer: Medicare Other

## 2021-09-03 DIAGNOSIS — Z5181 Encounter for therapeutic drug level monitoring: Secondary | ICD-10-CM

## 2021-09-03 DIAGNOSIS — I480 Paroxysmal atrial fibrillation: Secondary | ICD-10-CM

## 2021-09-03 LAB — POCT INR: INR: 2.5 (ref 2.0–3.0)

## 2021-09-03 NOTE — Patient Instructions (Signed)
continue taking warfarin 1mg  (1 tablet) daily. Next INR in 6 wks. Coumadin Clinic [phone] ?

## 2021-09-22 NOTE — Progress Notes (Signed)
? ? ? ? ?HPI:FU coronary artery disease status post coronary bypassing graft, as well as mitral valve repair in January 2009. Pt also with h/o atrial fibrillation and on amiodarone. Carotid Dopplers 8/22 showed 1 to 39% bilateral stenosis.  Nuclear study December 2022 showed ejection fraction 63% and no ischemia or infarction.  Echocardiogram December 2022 showed LV function, mild left ventricular hypertrophy, severe left atrial enlargement, trace insufficiency, well-seated mitral annuloplasty ring with moderate mitral stenosis (mean gradient 10.5 mmHg) and mild mitral regurgitation.  ABIs December 2022 normal on the left and noncompressible on the right.  Since last seen, he denies dyspnea, chest pain, palpitations or syncope.  He is complaining of increased hip pain since we increased his pravastatin. ? ?Current Outpatient Medications  ?Medication Sig Dispense Refill  ? amiodarone (PACERONE) 200 MG tablet TAKE 1 TABLET BY MOUTH  DAILY 90 tablet 3  ? amLODipine (NORVASC) 5 MG tablet TAKE 1 TABLET BY MOUTH  DAILY 90 tablet 3  ? doxazosin (CARDURA) 8 MG tablet TAKE 1 TABLET BY MOUTH AT  BEDTIME 90 tablet 3  ? ezetimibe (ZETIA) 10 MG tablet TAKE 1 TABLET BY MOUTH  DAILY 90 tablet 0  ? furosemide (LASIX) 20 MG tablet Take 20 mg by mouth daily as needed.    ? levothyroxine (SYNTHROID) 50 MCG tablet TAKE 1 TABLET BY MOUTH  DAILY BEFORE BREAKFAST 90 tablet 3  ? losartan (COZAAR) 100 MG tablet TAKE 1 TABLET BY MOUTH  DAILY 90 tablet 3  ? pravastatin (PRAVACHOL) 40 MG tablet Take 1 tablet (40 mg total) by mouth daily. 90 tablet 1  ? Vitamin D, Ergocalciferol, (DRISDOL) 50000 units CAPS capsule Take 1 capsule (50,000 Units total) by mouth every 7 (seven) days. 12 capsule 4  ? warfarin (COUMADIN) 1 MG tablet TAKE 1 TABLET BY MOUTH  DAILY OR AS DIRECTED BY  COUMADIN CLINIC 90 tablet 3  ? ?Current Facility-Administered Medications  ?Medication Dose Route Frequency Provider Last Rate Last Admin  ? 0.9 %  sodium chloride  infusion  500 mL Intravenous Continuous Ladene Artist, MD      ? ? ? ?Past Medical History:  ?Diagnosis Date  ? Anxiety   ? Atrial fibrillation ([place])   ? CAD (coronary artery disease)   ? s/p CABG  ? Carotid stenosis   ? mild by doppler  ? Complication of anesthesia   ? zenkers  diverticulum  ? Deep vein thrombophlebitis of leg ([facility])   ? DJD (degenerative joint disease)   ? Elevated TSH   ? secondary to Amio-normal free T4.   ? Essential hypertension, benign   ? GERD (gastroesophageal reflux disease)   ? Hemorrhoids   ? Hiatal hernia   ? Hyperlipidemia   ? PAF (paroxysmal atrial fibrillation) ([place])   ? Amiodarone  ? PE (pulmonary embolism)   ? pre 2007. Chronic anticoagulation  ? Prostate cancer ([place])   ? Sebaceous cyst   ? neck  ? Seborrheic dermatitis   ? Tubular adenoma of colon 05/2011  ? ? ?Past Surgical History:  ?Procedure Laterality Date  ? bilateral shoulder surgery    ? CORONARY ARTERY BYPASS GRAFT    ? x2    2009  ? I & D EXTREMITY  07/09/2011  ? Procedure: IRRIGATION AND DEBRIDEMENT EXTREMITY;  Surgeon: Tennis Must;  Location: [facility];  Service: Orthopedics;  Laterality: Left;  ? left knee arthroscopy    ? MITRAL VALVE REPAIR    ? prostate cancer surgery  11/1996  ? radial retropubic prostatectomy  ? TANDEM AND OVOID INSERTION    ? VASECTOMY    ? ZENKER'S DIVERTICULECTOMY N/A 11/07/2012  ? Procedure: JIRCVE'L DIVERTICULECTOMY ENDOSCOPIC;  Surgeon: Melida Quitter, MD;  Location: [facility];  Service: ENT;  Laterality: N/A;  ? ? ?Social History  ? ?Socioeconomic History  ? Marital status: Married  ?  Spouse name: margaret  ? Number of children: 3  ? Years of education: Not on file  ? Highest education level: Not on file  ?Occupational History  ? Occupation: retired  ?  Employer: RETIRED  ?Tobacco Use  ? Smoking status: Former  ?  Packs/day: 1.00  ?  Years: 12.00  ?  Pack years: 12.00  ?  Types: Cigarettes  ?  Quit date: 07/06/1968  ?  Years since quitting: 53.2  ? Smokeless tobacco: Never   ?Substance and Sexual Activity  ? Alcohol use: Yes  ?  Comment: social use  ? Drug use: No  ? Sexual activity: Not on file  ?Other Topics Concern  ? Not on file  ?Social History Narrative  ? Not on file  ? ?Social Determinants of Health  ? ?Financial Resource Strain: Not on file  ?Food Insecurity: Not on file  ?Transportation Needs: Not on file  ?Physical Activity: Not on file  ?Stress: Not on file  ?Social Connections: Not on file  ?Intimate Partner Violence: Not on file  ? ? ?Family History  ?Problem Relation Age of Onset  ? Heart attack Father   ? Stroke Mother   ? Hypertension Mother   ? Hypertension Brother   ? Heart disease Brother   ? Prostate cancer Brother   ? ? ?ROS: no fevers or chills, productive cough, hemoptysis, dysphasia, odynophagia, melena, hematochezia, dysuria, hematuria, rash, seizure activity, orthopnea, PND, pedal edema, claudication. Remaining systems are negative. ? ?Physical Exam: ?Well-developed well-nourished in no acute distress.  ?Skin is warm and dry.  ?HEENT is normal.  ?Neck is supple.  ?Chest is clear to auscultation with normal expansion.  ?Cardiovascular exam is regular rate and rhythm.  ?Abdominal exam nontender or distended. No masses palpated. ?Extremities show no edema. ?neuro grossly intact ? ?ECG-normal sinus rhythm at a rate of 79, prolonged QT interval.  Personally reviewed ? ?A/P ? ?1 paroxysmal atrial fibrillation-patient is in sinus rhythm today.  Continue amiodarone.  Continue Coumadin with goal INR 2-3.  Recent free T4 was elevated.  Decrease Synthroid to 25 mcg daily.  In 12 weeks we will check hemoglobin, renal function, liver functions, TSH, free T4 and chest x-ray. ? ?2 previous mitral valve repair-last echocardiogram showed moderate mitral stenosis and mild mitral regurgitation.  He will need follow-up studies in the future.  Continue SBE prophylaxis. ? ?3 hypertension-blood pressure controlled.  Continue present medical regimen. ? ?4 coronary artery  disease-continue statin.  Most recent nuclear study showed no ischemia. ? ?5 hyperlipidemia-he is complaining of bilateral hip pain that has increased since we increased the dose of his pravastatin.  He also did not tolerate any other statin in the past.  We will discontinue pravastatin and refer to lipid clinic for consideration of Repatha, Praluent or inclisiran. ? ?6 history of carotid disease-mild on most recent Dopplers. ? ?[name], MD ? ? ? ?

## 2021-10-02 DIAGNOSIS — Z5181 Encounter for therapeutic drug level monitoring: Secondary | ICD-10-CM | POA: Diagnosis not present

## 2021-10-02 DIAGNOSIS — R946 Abnormal results of thyroid function studies: Secondary | ICD-10-CM | POA: Diagnosis not present

## 2021-10-03 LAB — TSH+FREE T4
Free T4: 1.85 ng/dL — ABNORMAL HIGH (ref 0.82–1.77)
TSH: 3.16 u[IU]/mL (ref 0.450–4.500)

## 2021-10-06 ENCOUNTER — Ambulatory Visit (INDEPENDENT_AMBULATORY_CARE_PROVIDER_SITE_OTHER): Payer: Medicare Other | Admitting: Cardiology

## 2021-10-06 ENCOUNTER — Encounter: Payer: Self-pay | Admitting: Cardiology

## 2021-10-06 VITALS — BP 128/52 | HR 79 | Ht 69.0 in | Wt 157.0 lb

## 2021-10-06 DIAGNOSIS — I25810 Atherosclerosis of coronary artery bypass graft(s) without angina pectoris: Secondary | ICD-10-CM | POA: Diagnosis not present

## 2021-10-06 DIAGNOSIS — I480 Paroxysmal atrial fibrillation: Secondary | ICD-10-CM

## 2021-10-06 DIAGNOSIS — Z9889 Other specified postprocedural states: Secondary | ICD-10-CM

## 2021-10-06 DIAGNOSIS — E785 Hyperlipidemia, unspecified: Secondary | ICD-10-CM | POA: Diagnosis not present

## 2021-10-06 DIAGNOSIS — R946 Abnormal results of thyroid function studies: Secondary | ICD-10-CM

## 2021-10-06 DIAGNOSIS — R7989 Other specified abnormal findings of blood chemistry: Secondary | ICD-10-CM

## 2021-10-06 MED ORDER — LEVOTHYROXINE SODIUM 25 MCG PO TABS: 25.0000 ug | ORAL_TABLET | Freq: Every day | ORAL | 3 refills | Status: DC

## 2021-10-06 NOTE — Patient Instructions (Signed)
Medication Instructions:  ? ?REDUCE LEVOTHYROXINE DOSE TO 25 MCG ONCE DAILY= 1/2 OF THE 50 MG TABLET ONCE DAILY ? ?*If you need a refill on your cardiac medications before your next appointment, please call your pharmacy* ? ? ?Lab Work: ? ?Your physician recommends that you return for lab work in: [place] ? ?If you have labs (blood work) drawn today and your tests are completely normal, you will receive your results only by: ?MyChart Message (if you have MyChart) OR ?A paper copy in the mail ?If you have any lab test that is abnormal or we need to change your treatment, we will call you to review the results. ? ? ?Testing/Procedures: ? ?A chest x-ray takes a picture of the organs and structures inside the chest, including the heart, lungs, and blood vessels. This test can show several things, including, whether the heart is enlarges; whether fluid is building up in the lungs; and whether pacemaker / defibrillator leads are still in place. [facility] ? ? ?Follow-Up: ?At [facility], you and your health needs are our priority.  As part of our continuing mission to provide you with exceptional heart care, we have created designated Provider Care Teams.  These Care Teams include your primary Cardiologist (physician) and Advanced Practice Providers (APPs -  Physician Assistants and Nurse Practitioners) who all work together to provide you with the care you need, when you need it. ? ?We recommend signing up for the patient portal called "MyChart".  Sign up information is provided on this After Visit Summary.  MyChart is used to connect with patients for Virtual Visits (Telemedicine).  Patients are able to view lab/test results, encounter notes, upcoming appointments, etc.  Non-urgent messages can be sent to your provider as well.   ?To learn more about what you can do with MyChart, go to [email or web address].   ? ?Your next appointment:   ?6 month(s) ? ?The format  for your next appointment:   ?In Person ? ?Provider:   ?[name], MD   ? ? ?

## 2021-10-15 ENCOUNTER — Ambulatory Visit (INDEPENDENT_AMBULATORY_CARE_PROVIDER_SITE_OTHER): Payer: Medicare Other

## 2021-10-15 DIAGNOSIS — Z5181 Encounter for therapeutic drug level monitoring: Secondary | ICD-10-CM | POA: Diagnosis not present

## 2021-10-15 DIAGNOSIS — I480 Paroxysmal atrial fibrillation: Secondary | ICD-10-CM

## 2021-10-15 LAB — POCT INR: INR: 1.8 — AB (ref 2.0–3.0)

## 2021-10-15 MED ORDER — WARFARIN SODIUM 1 MG PO TABS: ORAL_TABLET | ORAL | 3 refills | Status: DC

## 2021-10-15 NOTE — Patient Instructions (Signed)
TAKE 1.5 TABLETS TODAY ONLY and then  continue taking warfarin '1mg'$  (1 tablet) daily. Next INR in 2 wks. Coumadin Clinic [phone] ?

## 2021-10-23 DIAGNOSIS — M810 Age-related osteoporosis without current pathological fracture: Secondary | ICD-10-CM | POA: Diagnosis not present

## 2021-10-28 ENCOUNTER — Other Ambulatory Visit: Payer: Self-pay | Admitting: Cardiology

## 2021-10-30 ENCOUNTER — Ambulatory Visit (INDEPENDENT_AMBULATORY_CARE_PROVIDER_SITE_OTHER): Payer: Medicare Other | Admitting: Pharmacist Clinician (PhC)/ Clinical Pharmacy Specialist

## 2021-10-30 ENCOUNTER — Encounter: Payer: Self-pay | Admitting: Pharmacist Clinician (PhC)/ Clinical Pharmacy Specialist

## 2021-10-30 DIAGNOSIS — E7800 Pure hypercholesterolemia, unspecified: Secondary | ICD-10-CM

## 2021-10-30 DIAGNOSIS — I480 Paroxysmal atrial fibrillation: Secondary | ICD-10-CM | POA: Diagnosis not present

## 2021-10-30 DIAGNOSIS — Z5181 Encounter for therapeutic drug level monitoring: Secondary | ICD-10-CM

## 2021-10-30 DIAGNOSIS — I25810 Atherosclerosis of coronary artery bypass graft(s) without angina pectoris: Secondary | ICD-10-CM

## 2021-10-30 LAB — POCT INR: INR: 2.7 (ref 2.0–3.0)

## 2021-10-30 NOTE — Assessment & Plan Note (Signed)
Patient with history of ASCVD and LDL not to goal despite trials with multiple statin drugs.  Reviewed options for lowering LDL cholesterol, including ezetimibe, PCSK-9 inhibitors, bempedoic acid and inclisiran.  Discussed mechanisms of action, dosing, side effects and potential decreases in LDL cholesterol.  Also reviewed cost information and potential options for patient assistance.  Answered all patient questions.  Based on this information, patient would prefer to start inclisiran, as it should be available to him at no cost.   We will start the paperwork to get this approved by his insurance.  Once approved, we will need to repeat lipid labs about a month after the second dose ? ?

## 2021-10-30 NOTE — Progress Notes (Signed)
10/30/2021 ?Nigel Bridgeman ?Nov 01, 1935 ?[id number] ? ? ?HPI:  John [name] is a 86 y.o. male patient of Dr Stanford Breed, who presents today for a lipid clinic evaluation.  See pertinent past medical history below.  He has tried multiple statin drugs without success, all causing myalgias to the point he has trouble going up/down stairs.  His most recent labs, with LDL at 73 were drawn while on pravastatin.  He is in the office today to talk about further options.   ? ?Past Medical History: ?CAD S/P CABG x 2 and mitral valve repair - 2009  ?AF CHADS2-VASc 4, on warfarin, amiodarone  ?hypothyroid 3/23 TSH WNL, on levothyroxine 25 mcg  ?PE/DVT Date unsure, prior to 03/2011  ? ? ?Current Medications: ezetimibe 10 mg daily ? ?Cholesterol Goals: LDL < 55 ?  ?Intolerant/previously tried: atorvastatin, rosuvastatin, simvastatin, and pravastatin - myalgias ? ?Family history: father died from MI at 77; brother with prior stroke, CABG, now 66; 3 daughters healthy ? ?Diet: does most of the cooking; chicken and fish mostly, lost of vegetables, salads, beans, corn;  fruit for snacks; onlly occasional cookies or chips ? ?Exercise:  quit tennis at 25, now cares for wife with dementia ? ?Labs: 1/23: TC 159, TG 59, HDL 74, LDL 73 (on pravastatin and ezetimibe) ? ? ?Current Outpatient Medications  ?Medication Sig Dispense Refill  ? amiodarone (PACERONE) 200 MG tablet TAKE 1 TABLET BY MOUTH  DAILY 90 tablet 3  ? amLODipine (NORVASC) 5 MG tablet TAKE 1 TABLET BY MOUTH  DAILY 90 tablet 3  ? doxazosin (CARDURA) 8 MG tablet TAKE 1 TABLET BY MOUTH AT  BEDTIME 90 tablet 3  ? ezetimibe (ZETIA) 10 MG tablet TAKE 1 TABLET BY MOUTH  DAILY 90 tablet 0  ? furosemide (LASIX) 20 MG tablet Take 20 mg by mouth daily as needed.    ? levothyroxine (SYNTHROID) 25 MCG tablet Take 1 tablet (25 mcg total) by mouth daily before breakfast. 90 tablet 3  ? losartan (COZAAR) 100 MG tablet TAKE 1 TABLET BY MOUTH  DAILY 90 tablet 3  ? Vitamin D, Ergocalciferol, (DRISDOL)  50000 units CAPS capsule Take 1 capsule (50,000 Units total) by mouth every 7 (seven) days. 12 capsule 4  ? warfarin (COUMADIN) 1 MG tablet Take 1-2 tablets Daily or as prescribed by Coumadin Clinic 90 tablet 3  ? ?Current Facility-Administered Medications  ?Medication Dose Route Frequency Provider Last Rate Last Admin  ? 0.9 %  sodium chloride infusion  500 mL Intravenous Continuous Ladene Artist, MD      ? ? ?Allergies  ?Allergen Reactions  ? Other Other (See Comments)  ? Iodine Hives  ? Iohexol Hives  ?   PT ALLERGIC TO IV CONTRAST;(REACTION DURING CARDIAC CATHETERIZATION); NEEDS PREMEDS PER DR CRENSHAW!, [place] Date: [id number] ?  ? Statins Other (See Comments)  ?  Joint aches with Zocor, Lipitor, Crestor  ? ? ?Past Medical History:  ?Diagnosis Date  ? Anxiety   ? Atrial fibrillation ([place])   ? CAD (coronary artery disease)   ? s/p CABG  ? Carotid stenosis   ? mild by doppler  ? Complication of anesthesia   ? zenkers  diverticulum  ? Deep vein thrombophlebitis of leg ([facility])   ? DJD (degenerative joint disease)   ? Elevated TSH   ? secondary to Amio-normal free T4.   ? Essential hypertension, benign   ? GERD (gastroesophageal reflux disease)   ? Hemorrhoids   ? Hiatal hernia   ?  Hyperlipidemia   ? PAF (paroxysmal atrial fibrillation) ([place])   ? Amiodarone  ? PE (pulmonary embolism)   ? pre 2007. Chronic anticoagulation  ? Prostate cancer ([place])   ? Sebaceous cyst   ? neck  ? Seborrheic dermatitis   ? Tubular adenoma of colon 05/2011  ? ? ?Blood pressure (!) 158/62, pulse 70, resp. rate 15, height '5\' 9"'$  (1.753 m), weight 169 lb (76.7 kg), SpO2 99 %. ? ? ?Hyperlipidemia ?Patient with history of ASCVD and LDL not to goal despite trials with multiple statin drugs.  Reviewed options for lowering LDL cholesterol, including ezetimibe, PCSK-9 inhibitors, bempedoic acid and inclisiran.  Discussed mechanisms of action, dosing, side effects and potential decreases in LDL cholesterol.  Also reviewed cost information and  potential options for patient assistance.  Answered all patient questions.  Based on this information, patient would prefer to start inclisiran, as it should be available to him at no cost.   We will start the paperwork to get this approved by his insurance.  Once approved, we will need to repeat lipid labs about a month after the second dose ? ? ?Tommy Medal PharmD CPP [facility] ?[address] ?[place], [place] [address] ?[phone] ? ? ? ?

## 2021-10-30 NOTE — Patient Instructions (Signed)
Your Results: ?           ? Your most recent labs Goal  ?Total Cholesterol 159 < 200  ?Triglycerides 59 < 150  ?HDL (happy/good cholesterol) 74 > 40  ?LDL (lousy/bad cholesterol 73 < 55  ? ?Medication changes: ? We will start the process to get Leqvio covered by your insurance.  Once approved [facility] will reach out to you regarding scheduling your first injection.  ? ? ? ?Thank you for choosing [facility]  ? ?

## 2021-11-04 ENCOUNTER — Telehealth: Payer: Self-pay

## 2021-11-04 NOTE — Telephone Encounter (Signed)
Called and lmom the pt that they were approved for leqvio and asked for them to call us back so we can schedule the injection at the infusion clinic. Will route back to myself to try calling 2 more times.  ?

## 2021-11-05 ENCOUNTER — Other Ambulatory Visit: Payer: Self-pay

## 2021-11-05 DIAGNOSIS — E785 Hyperlipidemia, unspecified: Secondary | ICD-10-CM

## 2021-11-05 NOTE — Telephone Encounter (Signed)
Pt called in to schedule leqvio injection but somehow the call was disconnected and when I tried to call him back they were giving me a busy tone ? ?

## 2021-11-05 NOTE — Telephone Encounter (Signed)
Returned  call to pt and succesfully scheduled leqvio injection mailed letter with instructions for arrival  and pt voiced understanding ?

## 2021-11-18 ENCOUNTER — Encounter: Payer: Self-pay | Admitting: *Deleted

## 2021-11-27 ENCOUNTER — Ambulatory Visit
Admission: RE | Admit: 2021-11-27 | Discharge: 2021-11-27 | Disposition: A | Discharge status: Home / Self Care | Payer: Medicare Other | Source: Ambulatory Visit | Attending: Cardiology | Admitting: Cardiology

## 2021-11-27 DIAGNOSIS — Z79899 Other long term (current) drug therapy: Secondary | ICD-10-CM | POA: Diagnosis not present

## 2021-11-27 DIAGNOSIS — Z5181 Encounter for therapeutic drug level monitoring: Secondary | ICD-10-CM | POA: Diagnosis not present

## 2021-11-27 DIAGNOSIS — I480 Paroxysmal atrial fibrillation: Secondary | ICD-10-CM

## 2021-11-28 ENCOUNTER — Other Ambulatory Visit (HOSPITAL_COMMUNITY): Payer: Self-pay

## 2021-12-02 ENCOUNTER — Encounter: Payer: Self-pay | Admitting: *Deleted

## 2021-12-02 ENCOUNTER — Encounter (HOSPITAL_COMMUNITY)
Admission: RE | Admit: 2021-12-02 | Discharge: 2021-12-02 | Disposition: A | Discharge status: Home / Self Care | Payer: Medicare Other | Source: Ambulatory Visit | Attending: Cardiology | Admitting: Cardiology

## 2021-12-02 DIAGNOSIS — E785 Hyperlipidemia, unspecified: Secondary | ICD-10-CM | POA: Insufficient documentation

## 2021-12-02 MED ORDER — INCLISIRAN SODIUM 284 MG/1.5ML ~~LOC~~ SOSY
284.0000 mg | PREFILLED_SYRINGE | Freq: Once | SUBCUTANEOUS | Status: AC
  Administered 2021-12-02: 284 mg via SUBCUTANEOUS

## 2021-12-02 MED ORDER — INCLISIRAN SODIUM 284 MG/1.5ML ~~LOC~~ SOSY
PREFILLED_SYRINGE | SUBCUTANEOUS | Status: AC
  Filled 2021-12-02: qty 1.5

## 2021-12-10 ENCOUNTER — Other Ambulatory Visit (HOSPITAL_BASED_OUTPATIENT_CLINIC_OR_DEPARTMENT_OTHER): Payer: Self-pay

## 2021-12-10 DIAGNOSIS — E785 Hyperlipidemia, unspecified: Secondary | ICD-10-CM

## 2021-12-11 DIAGNOSIS — Z0000 Encounter for general adult medical examination without abnormal findings: Secondary | ICD-10-CM | POA: Diagnosis not present

## 2021-12-11 DIAGNOSIS — I4891 Unspecified atrial fibrillation: Secondary | ICD-10-CM | POA: Diagnosis not present

## 2021-12-11 DIAGNOSIS — E039 Hypothyroidism, unspecified: Secondary | ICD-10-CM | POA: Diagnosis not present

## 2021-12-11 DIAGNOSIS — Z125 Encounter for screening for malignant neoplasm of prostate: Secondary | ICD-10-CM | POA: Diagnosis not present

## 2021-12-11 DIAGNOSIS — D689 Coagulation defect, unspecified: Secondary | ICD-10-CM | POA: Diagnosis not present

## 2021-12-11 DIAGNOSIS — D692 Other nonthrombocytopenic purpura: Secondary | ICD-10-CM | POA: Diagnosis not present

## 2021-12-11 DIAGNOSIS — I2510 Atherosclerotic heart disease of native coronary artery without angina pectoris: Secondary | ICD-10-CM | POA: Diagnosis not present

## 2021-12-11 DIAGNOSIS — I10 Essential (primary) hypertension: Secondary | ICD-10-CM | POA: Diagnosis not present

## 2021-12-11 DIAGNOSIS — K219 Gastro-esophageal reflux disease without esophagitis: Secondary | ICD-10-CM | POA: Diagnosis not present

## 2021-12-11 DIAGNOSIS — I6529 Occlusion and stenosis of unspecified carotid artery: Secondary | ICD-10-CM | POA: Diagnosis not present

## 2021-12-11 DIAGNOSIS — Z8546 Personal history of malignant neoplasm of prostate: Secondary | ICD-10-CM | POA: Diagnosis not present

## 2021-12-11 DIAGNOSIS — M810 Age-related osteoporosis without current pathological fracture: Secondary | ICD-10-CM | POA: Diagnosis not present

## 2021-12-25 ENCOUNTER — Ambulatory Visit (INDEPENDENT_AMBULATORY_CARE_PROVIDER_SITE_OTHER): Payer: Medicare Other

## 2021-12-25 DIAGNOSIS — E785 Hyperlipidemia, unspecified: Secondary | ICD-10-CM | POA: Diagnosis not present

## 2021-12-25 DIAGNOSIS — Z5181 Encounter for therapeutic drug level monitoring: Secondary | ICD-10-CM | POA: Diagnosis not present

## 2021-12-25 DIAGNOSIS — R7989 Other specified abnormal findings of blood chemistry: Secondary | ICD-10-CM | POA: Diagnosis not present

## 2021-12-25 DIAGNOSIS — I480 Paroxysmal atrial fibrillation: Secondary | ICD-10-CM | POA: Diagnosis not present

## 2021-12-25 LAB — COMPREHENSIVE METABOLIC PANEL
ALT: 24 IU/L (ref 0–44)
AST: 27 IU/L (ref 0–40)
Albumin/Globulin Ratio: 2.1 (ref 1.2–2.2)
Albumin: 4.4 g/dL (ref 3.6–4.6)
Alkaline Phosphatase: 142 IU/L — ABNORMAL HIGH (ref 44–121)
BUN/Creatinine Ratio: 17 (ref 10–24)
BUN: 24 mg/dL (ref 8–27)
Bilirubin Total: 0.4 mg/dL (ref 0.0–1.2)
CO2: 20 mmol/L (ref 20–29)
Calcium: 8.3 mg/dL — ABNORMAL LOW (ref 8.6–10.2)
Chloride: 108 mmol/L — ABNORMAL HIGH (ref 96–106)
Creatinine, Ser: 1.4 mg/dL — ABNORMAL HIGH (ref 0.76–1.27)
Globulin, Total: 2.1 g/dL (ref 1.5–4.5)
Glucose: 96 mg/dL (ref 70–99)
Potassium: 5.2 mmol/L (ref 3.5–5.2)
Sodium: 140 mmol/L (ref 134–144)
Total Protein: 6.5 g/dL (ref 6.0–8.5)
eGFR: 50 mL/min/{1.73_m2} — ABNORMAL LOW (ref >59)

## 2021-12-25 LAB — TSH+FREE T4
Free T4: 1.52 ng/dL (ref 0.82–1.77)
TSH: 6.2 u[IU]/mL — ABNORMAL HIGH (ref 0.450–4.500)

## 2021-12-25 LAB — POCT INR: INR: 1.4 — AB (ref 2.0–3.0)

## 2021-12-25 NOTE — Patient Instructions (Signed)
Description   Take 2 tablets today and then continue taking warfarin '1mg'$  (1 tablet) daily.  Recheck INR in 4 wks.  [facility] [phone]

## 2021-12-29 ENCOUNTER — Encounter: Payer: Self-pay | Admitting: *Deleted

## 2022-01-14 ENCOUNTER — Encounter (INDEPENDENT_AMBULATORY_CARE_PROVIDER_SITE_OTHER): Payer: Medicare Other | Admitting: Ophthalmology

## 2022-01-14 DIAGNOSIS — D0421 Carcinoma in situ of skin of right ear and external auricular canal: Secondary | ICD-10-CM | POA: Diagnosis not present

## 2022-01-14 DIAGNOSIS — L718 Other rosacea: Secondary | ICD-10-CM | POA: Diagnosis not present

## 2022-01-14 DIAGNOSIS — D485 Neoplasm of uncertain behavior of skin: Secondary | ICD-10-CM | POA: Diagnosis not present

## 2022-01-26 ENCOUNTER — Ambulatory Visit (INDEPENDENT_AMBULATORY_CARE_PROVIDER_SITE_OTHER): Payer: Medicare Other | Admitting: *Deleted

## 2022-01-26 DIAGNOSIS — I480 Paroxysmal atrial fibrillation: Secondary | ICD-10-CM | POA: Diagnosis not present

## 2022-01-26 DIAGNOSIS — Z5181 Encounter for therapeutic drug level monitoring: Secondary | ICD-10-CM

## 2022-01-26 LAB — POCT INR: INR: 1.3 — AB (ref 2.0–3.0)

## 2022-01-26 NOTE — Patient Instructions (Signed)
Description   Take 1.5 tablets of warfarin today and tomorrow, then START taking warfarin 1 tablet daily except for 1.5 tablets on Sundays. Recheck INR in 1.5 weeks.  Coumadin Clinic [phone]

## 2022-01-29 ENCOUNTER — Encounter (INDEPENDENT_AMBULATORY_CARE_PROVIDER_SITE_OTHER): Payer: Self-pay | Admitting: Ophthalmology

## 2022-01-29 ENCOUNTER — Ambulatory Visit (INDEPENDENT_AMBULATORY_CARE_PROVIDER_SITE_OTHER): Payer: Medicare Other | Admitting: Ophthalmology

## 2022-01-29 DIAGNOSIS — H33011 Retinal detachment with single break, right eye: Secondary | ICD-10-CM | POA: Diagnosis not present

## 2022-01-29 DIAGNOSIS — H532 Diplopia: Secondary | ICD-10-CM | POA: Diagnosis not present

## 2022-01-29 DIAGNOSIS — H35351 Cystoid macular degeneration, right eye: Secondary | ICD-10-CM | POA: Diagnosis not present

## 2022-01-29 DIAGNOSIS — H35371 Puckering of macula, right eye: Secondary | ICD-10-CM | POA: Diagnosis not present

## 2022-01-29 NOTE — Assessment & Plan Note (Signed)
Resolved and atrophy

## 2022-01-29 NOTE — Assessment & Plan Note (Signed)
Patient is adjusted to the diplopia by covering right eye with patch to prevent confusion

## 2022-01-29 NOTE — Progress Notes (Signed)
01/29/2022     CHIEF COMPLAINT Patient presents for  Chief Complaint  Patient presents with   Retina Evaluation      HISTORY OF PRESENT ILLNESS: John [name] is a 86 y.o. male who presents to the clinic today for:   HPI     Retina Evaluation           Laterality: right eye         Comments    History of complex retinal detachment and its repair.  Retina has been attached yet still with progressive atrophy ongoing as well as some moderate diplopia with a confusion between the 2 eyes. 9 mos fu oct fp Pt states his vision has been stable in his left eye but his right eye has not been stable Pt admits to new floaters but no FOL  Pt states "my right eye is terrible and I usually keep it covered or my balance is all off:      Last edited by Hurman Horn, MD on 01/29/2022 11:12 AM.      Referring physician: Deland Pretty, MD [place],  [place] [address]  HISTORICAL INFORMATION:   Selected notes from the MEDICAL RECORD NUMBER    Lab Results  Component Value Date   HGBA1C  07/22/2007    5.7 (NOTE)   The ADA recommends the following therapeutic goals for glycemic   control related to Hgb A1C measurement:   Goal of Therapy:   < 7.0% Hgb A1C   Action Suggested:  > 8.0% Hgb A1C   Ref:  Diabetes Care, 22, Suppl. 1, 1999     CURRENT MEDICATIONS: No current outpatient medications on file. (Ophthalmic Drugs)   No current facility-administered medications for this visit. (Ophthalmic Drugs)   Current Outpatient Medications (Other)  Medication Sig   amiodarone (PACERONE) 200 MG tablet TAKE 1 TABLET BY MOUTH  DAILY   amLODipine (NORVASC) 5 MG tablet TAKE 1 TABLET BY MOUTH  DAILY   doxazosin (CARDURA) 8 MG tablet TAKE 1 TABLET BY MOUTH AT  BEDTIME   ezetimibe (ZETIA) 10 MG tablet TAKE 1 TABLET BY MOUTH  DAILY   furosemide (LASIX) 20 MG tablet Take 20 mg by mouth daily as needed.   levothyroxine (SYNTHROID) 25 MCG tablet Take 1 tablet (25 mcg  total) by mouth daily before breakfast.   losartan (COZAAR) 100 MG tablet TAKE 1 TABLET BY MOUTH  DAILY   Vitamin D, Ergocalciferol, (DRISDOL) 50000 units CAPS capsule Take 1 capsule (50,000 Units total) by mouth every 7 (seven) days.   warfarin (COUMADIN) 1 MG tablet Take 1-2 tablets Daily or as prescribed by Coumadin Clinic   Current Facility-Administered Medications (Other)  Medication Route   0.9 %  sodium chloride infusion Intravenous      REVIEW OF SYSTEMS: ROS   Negative for: Constitutional, Gastrointestinal, Neurological, Skin, Genitourinary, Musculoskeletal, HENT, Endocrine, Cardiovascular, Eyes, Respiratory, Psychiatric, Allergic/Imm, Heme/Lymph Last edited by Hurman Horn, MD on 01/29/2022 11:07 AM.       ALLERGIES Allergies  Allergen Reactions   Other Other (See Comments)   Iodine Hives   Iohexol Hives     PT ALLERGIC TO IV CONTRAST;(REACTION DURING CARDIAC CATHETERIZATION); NEEDS PREMEDS PER DR CRENSHAW!, Onset Date: [id number]    Statins Other (See Comments)    Joint aches with Zocor, Lipitor, Crestor    PAST MEDICAL HISTORY Past Medical History:  Diagnosis Date   Anxiety    Atrial fibrillation ([place])    CAD (  coronary artery disease)    s/p CABG   Carotid stenosis    mild by doppler   Complication of anesthesia    zenkers  diverticulum   Deep vein thrombophlebitis of leg (HCC)    DJD (degenerative joint disease)    Elevated TSH    secondary to Amio-normal free T4.    Essential hypertension, benign    GERD (gastroesophageal reflux disease)    Hemorrhoids    Hiatal hernia    Hyperlipidemia    PAF (paroxysmal atrial fibrillation) (HCC)    Amiodarone   PE (pulmonary embolism)    pre 2007. Chronic anticoagulation   Prostate cancer ([place])    Sebaceous cyst    neck   Seborrheic dermatitis    Tubular adenoma of colon 05/2011   Past Surgical History:  Procedure Laterality Date   bilateral shoulder surgery     CORONARY ARTERY BYPASS GRAFT     x2     2009   I & D EXTREMITY  07/09/2011   Procedure: IRRIGATION AND DEBRIDEMENT EXTREMITY;  Surgeon: Tennis Must;  Location: [facility];  Service: Orthopedics;  Laterality: Left;   left knee arthroscopy     MITRAL VALVE REPAIR     prostate cancer surgery  11/1996   radial retropubic prostatectomy   TANDEM AND OVOID INSERTION     VASECTOMY     ZENKER'S DIVERTICULECTOMY N/A 11/07/2012   Procedure: ZENKER'S DIVERTICULECTOMY ENDOSCOPIC;  Surgeon: Melida Quitter, MD;  Location: [facility] OR;  Service: ENT;  Laterality: N/A;    FAMILY HISTORY Family History  Problem Relation Age of Onset   Heart attack Father    Stroke Mother    Hypertension Mother    Hypertension Brother    Heart disease Brother    Prostate cancer Brother     SOCIAL HISTORY Social History   Tobacco Use   Smoking status: Former    Packs/day: 1.00    Years: 12.00    Total pack years: 12.00    Types: Cigarettes    Quit date: 07/06/1968    Years since quitting: 53.6   Smokeless tobacco: Never  Substance Use Topics   Alcohol use: Yes    Comment: social use   Drug use: No         OPHTHALMIC EXAM:  Base Eye Exam     Visual Acuity (ETDRS)       Right Left   Dist [place] 20/150 20/20         Tonometry (Tonopen, 10:42 AM)       Right Left   Pressure 20 15         Pupils       Pupils APD   Right PERRL +2   Left PERRL None         Visual Fields       Left Right    Full    Restrictions  Partial outer superior temporal, inferior temporal, superior nasal, inferior nasal deficiencies         Neuro/Psych     Oriented x3: Yes   Mood/Affect: Normal         Dilation     Both eyes: 1.0% Mydriacyl, 2.5% Phenylephrine @ 10:40 AM           Slit Lamp and Fundus Exam     External Exam       Right Left   External Normal Normal         Slit Lamp Exam  Right Left   Lids/Lashes Normal Normal   Conjunctiva/Sclera White and quiet White and quiet   Cornea Clear Clear    Anterior Chamber Deep and quiet Deep and quiet   Iris Round and reactive Round and reactive   Lens Posterior chamber intraocular lens Posterior chamber intraocular lens   Anterior Vitreous Vitrectomized, clear Normal         Fundus Exam       Right Left   Posterior Vitreous Vitrectomized, clear Central vitreous floaters, Posterior vitreous detachment   Disc Peripapillary atrophy Normal   C/D Ratio 0.7 0.4   Macula Old chorioretinal scars.  Detached Normal   Vessels Normal Normal   Periphery Old retinopexy laser scars attached 360 Normal, no holes            IMAGING AND PROCEDURES  Imaging and Procedures for 01/29/22  OCT, Retina - OU - Both Eyes       Right Eye Quality was good. Scan locations included subfoveal. Central Foveal Thickness: 388. Progression has been stable. Findings include cystoid macular edema, epiretinal membrane.   Left Eye Quality was good. Scan locations included subfoveal. Central Foveal Thickness: 312. Progression has been stable. Findings include normal foveal contour.   Notes OD, with chronic CME OD as a residual of previous retinal detachment.  Diffuse atrophy OD. diffuse atrophy OD.  There is a minor epiretinal membrane,  Nasally. with limited visual potential, will observe  OS is normal     Color Fundus Photography Optos - OU - Both Eyes       Right Eye Progression has improved. Disc findings include increased cup to disc ratio.   Left Eye Progression has improved. Disc findings include normal observations. Macula : normal observations. Vessels : normal observations. Periphery : normal observations.   Notes History of complex retinal detachment and its repair right eye with multifocal chorioretinal scars.  Macula is flat and attached.  Some optic atrophy is noted.  Media is clear.  Left eye with vitreous debris, retinas attached medial clear vessels normal             ASSESSMENT/PLAN:  Diplopia Patient is adjusted to the  diplopia by covering right eye with patch to prevent confusion  Epiretinal membrane (ERM) of right eye Minor an operable  Cystoid macular edema of right eye Resolved and atrophy  Retinal detachment of right eye with single break No new breaks good retinopexy     ICD-10-CM   1. Cystoid macular edema of right eye  H35.351 OCT, Retina - OU - Both Eyes    Color Fundus Photography Optos - OU - Both Eyes    2. Diplopia  H53.2     3. Epiretinal membrane (ERM) of right eye  H35.371     4. Retinal detachment of right eye with single break  H33.011       1.  OD doing well.  No new findings no new breaks  2.  OS normal continue to observe  3.  Ophthalmic Meds Ordered this visit:  No orders of the defined types were placed in this encounter.      Return in about 1 year (around 01/30/2023) for DILATE OU, COLOR FP, OCT.  There are no Patient Instructions on file for this visit.   Explained the diagnoses, plan, and follow up with the patient and they expressed understanding.  Patient expressed understanding of the importance of proper follow up care.   Clent Demark Mariella Blackwelder M.D. Diseases & Surgery of the  Retina and Vitreous Retina & Diabetic [place] 01/29/22     Abbreviations: M myopia (nearsighted); A astigmatism; H hyperopia (farsighted); P presbyopia; Mrx spectacle prescription;  CTL contact lenses; OD right eye; OS left eye; OU both eyes  XT exotropia; ET esotropia; PEK punctate epithelial keratitis; PEE punctate epithelial erosions; DES dry eye syndrome; MGD meibomian gland dysfunction; ATs artificial tears; PFAT's preservative free artificial tears; [place] nuclear sclerotic cataract; PSC posterior subcapsular cataract; ERM epi-retinal membrane; PVD posterior vitreous detachment; RD retinal detachment; DM diabetes mellitus; DR diabetic retinopathy; NPDR non-proliferative diabetic retinopathy; PDR proliferative diabetic retinopathy; CSME clinically significant macular edema; DME  diabetic macular edema; dbh dot blot hemorrhages; CWS cotton wool spot; POAG primary open angle glaucoma; C/D cup-to-disc ratio; HVF humphrey visual field; GVF goldmann visual field; OCT optical coherence tomography; IOP intraocular pressure; BRVO Branch retinal vein occlusion; CRVO central retinal vein occlusion; CRAO central retinal artery occlusion; BRAO branch retinal artery occlusion; RT retinal tear; SB scleral buckle; PPV pars plana vitrectomy; VH Vitreous hemorrhage; PRP panretinal laser photocoagulation; IVK intravitreal kenalog; VMT vitreomacular traction; MH Macular hole;  NVD neovascularization of the disc; NVE neovascularization elsewhere; AREDS age related eye disease study; ARMD age related macular degeneration; POAG primary open angle glaucoma; EBMD epithelial/anterior basement membrane dystrophy; ACIOL anterior chamber intraocular lens; IOL intraocular lens; PCIOL posterior chamber intraocular lens; Phaco/IOL phacoemulsification with intraocular lens placement; [place] photorefractive keratectomy; LASIK laser assisted in situ keratomileusis; HTN hypertension; DM diabetes mellitus; COPD chronic obstructive pulmonary disease

## 2022-01-29 NOTE — Assessment & Plan Note (Signed)
Minor an operable

## 2022-01-29 NOTE — Assessment & Plan Note (Signed)
No new breaks good retinopexy

## 2022-02-05 ENCOUNTER — Ambulatory Visit (INDEPENDENT_AMBULATORY_CARE_PROVIDER_SITE_OTHER): Payer: Medicare Other

## 2022-02-05 DIAGNOSIS — Z5181 Encounter for therapeutic drug level monitoring: Secondary | ICD-10-CM

## 2022-02-05 DIAGNOSIS — I480 Paroxysmal atrial fibrillation: Secondary | ICD-10-CM

## 2022-02-05 LAB — POCT INR: INR: 1.3 — AB (ref 2.0–3.0)

## 2022-02-05 NOTE — Patient Instructions (Signed)
Take 2 tablets of warfarin today only then START taking warfarin 1 tablet daily except for 1.5 tablets on Sunday and Thursday. Recheck INR in 2 weeks.  [facility]in Clinic [phone]

## 2022-02-11 DIAGNOSIS — H532 Diplopia: Secondary | ICD-10-CM | POA: Diagnosis not present

## 2022-02-11 DIAGNOSIS — Z961 Presence of intraocular lens: Secondary | ICD-10-CM | POA: Diagnosis not present

## 2022-02-11 DIAGNOSIS — H524 Presbyopia: Secondary | ICD-10-CM | POA: Diagnosis not present

## 2022-02-19 ENCOUNTER — Ambulatory Visit (INDEPENDENT_AMBULATORY_CARE_PROVIDER_SITE_OTHER): Payer: Medicare Other | Admitting: *Deleted

## 2022-02-19 ENCOUNTER — Ambulatory Visit (HOSPITAL_COMMUNITY)
Admission: RE | Admit: 2022-02-19 | Discharge: 2022-02-19 | Disposition: A | Discharge status: Home / Self Care | Payer: Medicare Other | Source: Ambulatory Visit | Attending: Physician Assistant | Admitting: Physician Assistant

## 2022-02-19 DIAGNOSIS — Z5181 Encounter for therapeutic drug level monitoring: Secondary | ICD-10-CM | POA: Diagnosis not present

## 2022-02-19 DIAGNOSIS — I480 Paroxysmal atrial fibrillation: Secondary | ICD-10-CM

## 2022-02-19 DIAGNOSIS — I6523 Occlusion and stenosis of bilateral carotid arteries: Secondary | ICD-10-CM | POA: Insufficient documentation

## 2022-02-19 LAB — POCT INR: INR: 1.3 — AB (ref 2.0–3.0)

## 2022-02-19 NOTE — Patient Instructions (Addendum)
Description   Take 2 tablets of warfarin today and 2 tablets tomorrow then start taking warfarin 1 tablet daily except for 1.5 tablets on Sunday, Tuesday, and Thursday. Recheck INR in 2 weeks.  [facility] [phone]

## 2022-03-01 ENCOUNTER — Other Ambulatory Visit: Payer: Self-pay | Admitting: Cardiology

## 2022-03-01 DIAGNOSIS — I4891 Unspecified atrial fibrillation: Secondary | ICD-10-CM

## 2022-03-02 NOTE — Telephone Encounter (Signed)
Last OV with Dr. Stanford Breed on 10/06/2021 & last Anticoagulation visit 02/19/22

## 2022-03-03 ENCOUNTER — Ambulatory Visit (HOSPITAL_COMMUNITY)
Admission: RE | Admit: 2022-03-03 | Discharge: 2022-03-03 | Disposition: A | Discharge status: Home / Self Care | Payer: Medicare Other | Source: Ambulatory Visit | Attending: Cardiology | Admitting: Cardiology

## 2022-03-03 DIAGNOSIS — E785 Hyperlipidemia, unspecified: Secondary | ICD-10-CM | POA: Insufficient documentation

## 2022-03-03 MED ORDER — INCLISIRAN SODIUM 284 MG/1.5ML ~~LOC~~ SOSY
284.0000 mg | PREFILLED_SYRINGE | Freq: Once | SUBCUTANEOUS | Status: AC
  Administered 2022-03-03: 284 mg via SUBCUTANEOUS

## 2022-03-03 MED ORDER — INCLISIRAN SODIUM 284 MG/1.5ML ~~LOC~~ SOSY
PREFILLED_SYRINGE | SUBCUTANEOUS | Status: AC
  Filled 2022-03-03: qty 1.5

## 2022-03-04 DIAGNOSIS — L720 Epidermal cyst: Secondary | ICD-10-CM | POA: Diagnosis not present

## 2022-03-04 DIAGNOSIS — D0421 Carcinoma in situ of skin of right ear and external auricular canal: Secondary | ICD-10-CM | POA: Diagnosis not present

## 2022-03-04 DIAGNOSIS — L821 Other seborrheic keratosis: Secondary | ICD-10-CM | POA: Diagnosis not present

## 2022-03-05 ENCOUNTER — Ambulatory Visit: Payer: Medicare Other | Attending: Cardiology | Admitting: *Deleted

## 2022-03-05 DIAGNOSIS — Z5181 Encounter for therapeutic drug level monitoring: Secondary | ICD-10-CM | POA: Insufficient documentation

## 2022-03-05 DIAGNOSIS — I480 Paroxysmal atrial fibrillation: Secondary | ICD-10-CM | POA: Insufficient documentation

## 2022-03-05 LAB — POCT INR: INR: 1.6 — AB (ref 2.0–3.0)

## 2022-03-05 NOTE — Patient Instructions (Addendum)
Description   Take 2 tablets of warfarin today then start taking warfarin 1.5 tablets daily except for 1 tablet on Monday and Fridays. Recheck INR in 2 weeks.  [facility]madin Clinic [phone]

## 2022-03-19 ENCOUNTER — Ambulatory Visit: Payer: Medicare Other | Attending: Cardiology | Admitting: *Deleted

## 2022-03-19 DIAGNOSIS — Z5181 Encounter for therapeutic drug level monitoring: Secondary | ICD-10-CM | POA: Diagnosis not present

## 2022-03-19 DIAGNOSIS — I480 Paroxysmal atrial fibrillation: Secondary | ICD-10-CM

## 2022-03-19 LAB — POCT INR: INR: 1.8 — AB (ref 2.0–3.0)

## 2022-03-19 NOTE — Patient Instructions (Addendum)
Description   Take 2 tablets of warfarin today then start taking warfarin 1.5 tablets daily except 1 tablet on Fridays. Recheck INR in 2 weeks. [facility]in Clinic [phone]

## 2022-04-02 ENCOUNTER — Ambulatory Visit: Payer: Medicare Other | Attending: Cardiovascular Disease

## 2022-04-02 DIAGNOSIS — I480 Paroxysmal atrial fibrillation: Secondary | ICD-10-CM | POA: Diagnosis not present

## 2022-04-02 DIAGNOSIS — Z5181 Encounter for therapeutic drug level monitoring: Secondary | ICD-10-CM

## 2022-04-02 LAB — POCT INR: INR: 1.8 — AB (ref 2.0–3.0)

## 2022-04-02 NOTE — Patient Instructions (Signed)
Take 2 tablets of warfarin today then start taking warfarin 1.5 tablets daily.  Recheck INR in 3 weeks. [facility]in Clinic [phone]

## 2022-04-10 NOTE — Progress Notes (Signed)
HPI: FU coronary artery disease status post coronary bypassing graft, as well as mitral valve repair in January 2009. Pt also with h/o atrial fibrillation and on amiodarone. Nuclear study December 2022 showed ejection fraction 63% and no ischemia or infarction.  Echocardiogram December 2022 showed LV function, mild left ventricular hypertrophy, severe left atrial enlargement, trace insufficiency, well-seated mitral annuloplasty ring with moderate mitral stenosis (mean gradient 10.5 mmHg) and mild mitral regurgitation.  ABIs December 2022 normal on the left and noncompressible on the right. Carotid dopplers 8/23 showed 1-39 bilateral stenosis. Since last seen, patient denies dyspnea, chest pain, palpitations or syncope.  No bleeding.  Current Outpatient Medications  Medication Sig Dispense Refill   amiodarone (PACERONE) 200 MG tablet TAKE 1 TABLET BY MOUTH  DAILY 90 tablet 3   amLODipine (NORVASC) 5 MG tablet TAKE 1 TABLET BY MOUTH  DAILY 90 tablet 3   doxazosin (CARDURA) 8 MG tablet TAKE 1 TABLET BY MOUTH AT  BEDTIME 90 tablet 3   ezetimibe (ZETIA) 10 MG tablet TAKE 1 TABLET BY MOUTH  DAILY 90 tablet 0   furosemide (LASIX) 20 MG tablet Take 20 mg by mouth daily as needed.     levothyroxine (SYNTHROID) 25 MCG tablet Take 1 tablet (25 mcg total) by mouth daily before breakfast. 90 tablet 3   losartan (COZAAR) 100 MG tablet TAKE 1 TABLET BY MOUTH  DAILY 90 tablet 3   Vitamin D, Ergocalciferol, (DRISDOL) 50000 units CAPS capsule Take 1 capsule (50,000 Units total) by mouth every 7 (seven) days. 12 capsule 4   warfarin (COUMADIN) 1 MG tablet TAKE 1 TO 1.5 (1 and 1/2) TABLETS BY MOUTH  DAILY OR AS PRESCRIBED BY COUMADIN CLINIC 120 tablet 1   Current Facility-Administered Medications  Medication Dose Route Frequency Provider Last Rate Last Admin   0.9 %  sodium chloride infusion  500 mL Intravenous Continuous Ladene Artist, MD         Past Medical History:  Diagnosis Date   Anxiety     Atrial fibrillation (HCC)    CAD (coronary artery disease)    s/p CABG   Carotid stenosis    mild by doppler   Complication of anesthesia    zenkers  diverticulum   Deep vein thrombophlebitis of leg (HCC)    DJD (degenerative joint disease)    Elevated TSH    secondary to Amio-normal free T4.    Essential hypertension, benign    GERD (gastroesophageal reflux disease)    Hemorrhoids    Hiatal hernia    Hyperlipidemia    PAF (paroxysmal atrial fibrillation) (HCC)    Amiodarone   PE (pulmonary embolism)    pre 2007. Chronic anticoagulation   Prostate cancer ([place])    Sebaceous cyst    neck   Seborrheic dermatitis    Tubular adenoma of colon 05/2011    Past Surgical History:  Procedure Laterality Date   bilateral shoulder surgery     CORONARY ARTERY BYPASS GRAFT     x2    2009   I & D EXTREMITY  07/09/2011   Procedure: IRRIGATION AND DEBRIDEMENT EXTREMITY;  Surgeon: Tennis Must;  Location: [place] [facility];  Service: Orthopedics;  Laterality: Left;   left knee arthroscopy     MITRAL VALVE REPAIR     prostate cancer surgery  11/1996   radial retropubic prostatectomy   TANDEM AND OVOID INSERTION     VASECTOMY     ZENKER'S DIVERTICULECTOMY N/A 11/07/2012  Procedure: ZENKER'S DIVERTICULECTOMY ENDOSCOPIC;  Surgeon: Melida Quitter, MD;  [facility];  Service: ENT;  Laterality: N/A;    Social History   Socioeconomic History   Marital status: Married    Spouse name: margaret   Number of children: 3   Years of education: Not on file   Highest education level: Not on file  Occupational History   Occupation: retired    Fish farm manager: RETIRED  Tobacco Use   Smoking status: Former    Packs/day: 1.00    Years: 12.00    Total pack years: 12.00    Types: Cigarettes    Quit date: 07/06/1968    Years since quitting: 53.8   Smokeless tobacco: Never  Substance and Sexual Activity   Alcohol use: Yes    Comment: social use   Drug use: No   Sexual activity: Not on file   Other Topics Concern   Not on file  Social History Narrative   Not on file   Social Determinants of Health   Financial Resource Strain: Not on file  Food Insecurity: Not on file  Transportation Needs: Not on file  Physical Activity: Not on file  Stress: Not on file  Social Connections: Not on file  Intimate Partner Violence: Not on file    Family History  Problem Relation Age of Onset   Heart attack Father    Stroke Mother    Hypertension Mother    Hypertension Brother    Heart disease Brother    Prostate cancer Brother     ROS: no fevers or chills, productive cough, hemoptysis, dysphasia, odynophagia, melena, hematochezia, dysuria, hematuria, rash, seizure activity, orthopnea, PND, pedal edema, claudication. Remaining systems are negative.  Physical Exam: Well-developed well-nourished in no acute distress.  Skin is warm and dry.  HEENT is normal.  Neck is supple.  Chest is clear to auscultation with normal expansion.  Cardiovascular exam is regular rate and rhythm.  Abdominal exam nontender or distended. No masses palpated. Extremities show no edema. neuro grossly intact  ECG-normal sinus rhythm at a rate of 69, possible lateral infarct.  Personally reviewed  A/P  1 paroxysmal atrial fibrillation-patient remains in sinus rhythm on examination.  Continue Coumadin and amiodarone.  Check TSH, free T4 and liver function.  He previously declined apixaban due to expense.  He will check with his insurance company to see if affordable.  Note he is also on anticoagulation given history of pulmonary emboli.  2 status post mitral valve repair-moderate mitral stenosis and mild mitral regurgitation on most recent echocardiogram.  Repeat echocardiogram in December.  Continue SBE prophylaxis.  3 hypertension-patient's blood pressure is controlled.  Continue present medications.  Check potassium and renal function.  4 coronary artery disease-continue Zetia.  No aspirin given need  for anticoagulation.  5 hyperlipidemia-check lipids.  6 carotid artery disease-minimal on most recent Dopplers.  [name], MD

## 2022-04-20 DIAGNOSIS — Z23 Encounter for immunization: Secondary | ICD-10-CM | POA: Diagnosis not present

## 2022-04-24 ENCOUNTER — Encounter: Payer: Self-pay | Admitting: Cardiology

## 2022-04-24 ENCOUNTER — Ambulatory Visit (INDEPENDENT_AMBULATORY_CARE_PROVIDER_SITE_OTHER): Payer: Medicare Other | Admitting: *Deleted

## 2022-04-24 ENCOUNTER — Ambulatory Visit: Payer: Medicare Other | Attending: Cardiology | Admitting: Cardiology

## 2022-04-24 VITALS — BP 140/60 | HR 69 | Wt 166.6 lb

## 2022-04-24 DIAGNOSIS — Z9889 Other specified postprocedural states: Secondary | ICD-10-CM | POA: Diagnosis not present

## 2022-04-24 DIAGNOSIS — I25810 Atherosclerosis of coronary artery bypass graft(s) without angina pectoris: Secondary | ICD-10-CM | POA: Insufficient documentation

## 2022-04-24 DIAGNOSIS — I6523 Occlusion and stenosis of bilateral carotid arteries: Secondary | ICD-10-CM

## 2022-04-24 DIAGNOSIS — I480 Paroxysmal atrial fibrillation: Secondary | ICD-10-CM | POA: Insufficient documentation

## 2022-04-24 DIAGNOSIS — Z5181 Encounter for therapeutic drug level monitoring: Secondary | ICD-10-CM | POA: Diagnosis not present

## 2022-04-24 DIAGNOSIS — E785 Hyperlipidemia, unspecified: Secondary | ICD-10-CM | POA: Insufficient documentation

## 2022-04-24 DIAGNOSIS — R7989 Other specified abnormal findings of blood chemistry: Secondary | ICD-10-CM

## 2022-04-24 LAB — COMPREHENSIVE METABOLIC PANEL
ALT: 35 IU/L (ref 0–44)
AST: 44 IU/L — ABNORMAL HIGH (ref 0–40)
Albumin/Globulin Ratio: 1.9 (ref 1.2–2.2)
Albumin: 4.3 g/dL (ref 3.7–4.7)
Alkaline Phosphatase: 187 IU/L — ABNORMAL HIGH (ref 44–121)
BUN/Creatinine Ratio: 19 (ref 10–24)
BUN: 28 mg/dL — ABNORMAL HIGH (ref 8–27)
Bilirubin Total: 0.7 mg/dL (ref 0.0–1.2)
CO2: 23 mmol/L (ref 20–29)
Calcium: 9.1 mg/dL (ref 8.6–10.2)
Chloride: 108 mmol/L — ABNORMAL HIGH (ref 96–106)
Creatinine, Ser: 1.45 mg/dL — ABNORMAL HIGH (ref 0.76–1.27)
Globulin, Total: 2.3 g/dL (ref 1.5–4.5)
Glucose: 96 mg/dL (ref 70–99)
Potassium: 5.1 mmol/L (ref 3.5–5.2)
Sodium: 143 mmol/L (ref 134–144)
Total Protein: 6.6 g/dL (ref 6.0–8.5)
eGFR: 48 mL/min/{1.73_m2} — ABNORMAL LOW (ref >59)

## 2022-04-24 LAB — TSH+FREE T4
Free T4: 1.71 ng/dL (ref 0.82–1.77)
TSH: 8.22 u[IU]/mL — ABNORMAL HIGH (ref 0.450–4.500)

## 2022-04-24 LAB — POCT INR: INR: 1.7 — AB (ref 2.0–3.0)

## 2022-04-24 NOTE — Patient Instructions (Signed)
Medication Instructions:   ELIQUIS TO REPLACE WARFARIN  *If you need a refill on your cardiac medications before your next appointment, please call your pharmacy*   Testing/Procedures:  Your physician has requested that you have an echocardiogram. Echocardiography is a painless test that uses sound waves to create images of your heart. It provides your doctor with information about the size and shape of your heart and how well your heart's chambers and valves are working. This procedure takes approximately one hour. There are no restrictions for this procedure. Please do NOT wear cologne, perfume, aftershave, or lotions (deodorant is allowed). Please arrive 15 minutes prior to your appointment time.  [place]  Follow-Up: At [facility], you and your health needs are our priority.  As part of our continuing mission to provide you with exceptional heart care, we have created designated Provider Care Teams.  These Care Teams include your primary Cardiologist (physician) and Advanced Practice Providers (APPs -  Physician Assistants and Nurse Practitioners) who all work together to provide you with the care you need, when you need it.  We recommend signing up for the patient portal called "MyChart".  Sign up information is provided on this After Visit Summary.  MyChart is used to connect with patients for Virtual Visits (Telemedicine).  Patients are able to view lab/test results, encounter notes, upcoming appointments, etc.  Non-urgent messages can be sent to your provider as well.   To learn more about what you can do with MyChart, go to [email or web address].    Your next appointment:   6 month(s)  The format for your next appointment:   In Person  Provider:   [name], MD

## 2022-04-24 NOTE — Patient Instructions (Addendum)
Description   Take 2 tablets of warfarin today then start taking warfarin 1.5 tablets daily except on Sunday and Thursday take 2 tablets.  Recheck INR in 2 weeks. [facility]in Clinic [phone]

## 2022-04-27 DIAGNOSIS — M810 Age-related osteoporosis without current pathological fracture: Secondary | ICD-10-CM | POA: Diagnosis not present

## 2022-04-28 ENCOUNTER — Other Ambulatory Visit: Payer: Self-pay | Admitting: Cardiology

## 2022-04-28 DIAGNOSIS — I10 Essential (primary) hypertension: Secondary | ICD-10-CM

## 2022-05-03 ENCOUNTER — Other Ambulatory Visit: Payer: Self-pay | Admitting: Cardiology

## 2022-05-03 DIAGNOSIS — I4891 Unspecified atrial fibrillation: Secondary | ICD-10-CM

## 2022-05-04 ENCOUNTER — Other Ambulatory Visit: Payer: Self-pay | Admitting: *Deleted

## 2022-05-04 DIAGNOSIS — R7989 Other specified abnormal findings of blood chemistry: Secondary | ICD-10-CM

## 2022-05-04 NOTE — Telephone Encounter (Signed)
Last INR 04/24/2022 Last OV 04/24/2022

## 2022-05-08 ENCOUNTER — Ambulatory Visit (HOSPITAL_COMMUNITY): Payer: Medicare Other | Attending: Cardiology

## 2022-05-08 DIAGNOSIS — I25810 Atherosclerosis of coronary artery bypass graft(s) without angina pectoris: Secondary | ICD-10-CM | POA: Diagnosis not present

## 2022-05-08 DIAGNOSIS — I480 Paroxysmal atrial fibrillation: Secondary | ICD-10-CM | POA: Insufficient documentation

## 2022-05-08 DIAGNOSIS — Z9889 Other specified postprocedural states: Secondary | ICD-10-CM | POA: Diagnosis not present

## 2022-05-08 LAB — ECHOCARDIOGRAM COMPLETE
AR max vel: 1.55 cm2
AV Area VTI: 1.72 cm2
AV Area mean vel: 1.58 cm2
AV Mean grad: 9 mmHg
AV Peak grad: 16 mmHg
Ao pk vel: 2 m/s
Area-P 1/2: 2.34 cm2
MV VTI: 1.15 cm2
S' Lateral: 2.3 cm

## 2022-05-13 ENCOUNTER — Ambulatory Visit: Payer: Medicare Other | Attending: Cardiology | Admitting: *Deleted

## 2022-05-13 DIAGNOSIS — I480 Paroxysmal atrial fibrillation: Secondary | ICD-10-CM

## 2022-05-13 DIAGNOSIS — Z5181 Encounter for therapeutic drug level monitoring: Secondary | ICD-10-CM | POA: Diagnosis not present

## 2022-05-13 LAB — POCT INR: INR: 2.1 (ref 2.0–3.0)

## 2022-05-13 NOTE — Patient Instructions (Signed)
Description   Continue taking warfarin 1.5 tablets daily except on Sunday and Thursday take 2 tablets.  Recheck INR in 3 weeks. [facility] [phone]

## 2022-06-01 ENCOUNTER — Ambulatory Visit: Payer: Medicare Other

## 2022-06-18 DIAGNOSIS — Z23 Encounter for immunization: Secondary | ICD-10-CM | POA: Diagnosis not present

## 2022-07-22 ENCOUNTER — Other Ambulatory Visit: Payer: Self-pay | Admitting: Cardiology

## 2022-07-27 ENCOUNTER — Ambulatory Visit: Payer: Medicare Other | Attending: Internal Medicine | Admitting: *Deleted

## 2022-07-27 DIAGNOSIS — Z5181 Encounter for therapeutic drug level monitoring: Secondary | ICD-10-CM | POA: Diagnosis not present

## 2022-07-27 DIAGNOSIS — I480 Paroxysmal atrial fibrillation: Secondary | ICD-10-CM

## 2022-07-27 LAB — POCT INR: POC INR: 2.1

## 2022-07-27 NOTE — Patient Instructions (Signed)
Description   Continue taking warfarin 1.5 tablets daily except on Sunday and Thursday take 2 tablets.  Recheck INR in 4 weeks. [facility]in Clinic [phone]

## 2022-08-09 ENCOUNTER — Other Ambulatory Visit: Payer: Self-pay | Admitting: Cardiology

## 2022-08-09 DIAGNOSIS — R7989 Other specified abnormal findings of blood chemistry: Secondary | ICD-10-CM

## 2022-08-18 ENCOUNTER — Encounter: Payer: Self-pay | Admitting: *Deleted

## 2022-08-24 ENCOUNTER — Ambulatory Visit: Payer: Medicare Other

## 2022-08-27 ENCOUNTER — Ambulatory Visit: Payer: Medicare Other | Attending: Cardiology

## 2022-08-27 DIAGNOSIS — I480 Paroxysmal atrial fibrillation: Secondary | ICD-10-CM

## 2022-08-27 DIAGNOSIS — Z5181 Encounter for therapeutic drug level monitoring: Secondary | ICD-10-CM | POA: Diagnosis not present

## 2022-08-27 LAB — POCT INR: INR: 2.6 (ref 2.0–3.0)

## 2022-08-27 NOTE — Patient Instructions (Signed)
Description   Continue taking warfarin 1.5 tablets daily except on Sunday and Thursday take 2 tablets.  Recheck INR in 5 weeks.  [facility] Clinic [phone]

## 2022-09-02 DIAGNOSIS — R7989 Other specified abnormal findings of blood chemistry: Secondary | ICD-10-CM | POA: Diagnosis not present

## 2022-09-03 ENCOUNTER — Encounter (HOSPITAL_COMMUNITY)
Admission: RE | Admit: 2022-09-03 | Discharge: 2022-09-03 | Disposition: A | Discharge status: Home / Self Care | Payer: Medicare Other | Source: Ambulatory Visit | Attending: Cardiology | Admitting: Cardiology

## 2022-09-03 DIAGNOSIS — E785 Hyperlipidemia, unspecified: Secondary | ICD-10-CM | POA: Diagnosis not present

## 2022-09-03 DIAGNOSIS — I10 Essential (primary) hypertension: Secondary | ICD-10-CM | POA: Diagnosis not present

## 2022-09-03 DIAGNOSIS — I2510 Atherosclerotic heart disease of native coronary artery without angina pectoris: Secondary | ICD-10-CM | POA: Diagnosis not present

## 2022-09-03 DIAGNOSIS — I4891 Unspecified atrial fibrillation: Secondary | ICD-10-CM | POA: Diagnosis not present

## 2022-09-03 DIAGNOSIS — E782 Mixed hyperlipidemia: Secondary | ICD-10-CM | POA: Diagnosis not present

## 2022-09-03 LAB — HEPATIC FUNCTION PANEL
ALT: 17 IU/L (ref 0–44)
AST: 27 IU/L (ref 0–40)
Albumin: 4.3 g/dL (ref 3.7–4.7)
Alkaline Phosphatase: 129 IU/L — ABNORMAL HIGH (ref 44–121)
Bilirubin Total: 0.5 mg/dL (ref 0.0–1.2)
Bilirubin, Direct: 0.18 mg/dL (ref 0.00–0.40)
Total Protein: 6.3 g/dL (ref 6.0–8.5)

## 2022-09-03 MED ORDER — INCLISIRAN SODIUM 284 MG/1.5ML ~~LOC~~ SOSY
284.0000 mg | PREFILLED_SYRINGE | Freq: Once | SUBCUTANEOUS | Status: AC
  Administered 2022-09-03: 284 mg via SUBCUTANEOUS

## 2022-09-03 MED ORDER — INCLISIRAN SODIUM 284 MG/1.5ML ~~LOC~~ SOSY
PREFILLED_SYRINGE | SUBCUTANEOUS | Status: AC
  Filled 2022-09-03: qty 1.5

## 2022-09-04 ENCOUNTER — Encounter: Payer: Self-pay | Admitting: *Deleted

## 2022-09-25 ENCOUNTER — Other Ambulatory Visit: Payer: Self-pay | Admitting: Cardiology

## 2022-09-25 DIAGNOSIS — I4891 Unspecified atrial fibrillation: Secondary | ICD-10-CM

## 2022-09-28 NOTE — Telephone Encounter (Signed)
Refill request for warfarin:  Last INR was 2.6 on 08/27/22 Next INR due 10/01/22 LOV was 04/24/22  Thresa Ross MD  Refill approved.

## 2022-10-01 ENCOUNTER — Ambulatory Visit: Payer: Medicare Other

## 2022-10-04 DIAGNOSIS — E782 Mixed hyperlipidemia: Secondary | ICD-10-CM | POA: Diagnosis not present

## 2022-10-04 DIAGNOSIS — I10 Essential (primary) hypertension: Secondary | ICD-10-CM | POA: Diagnosis not present

## 2022-10-04 DIAGNOSIS — I4891 Unspecified atrial fibrillation: Secondary | ICD-10-CM | POA: Diagnosis not present

## 2022-10-04 DIAGNOSIS — I2510 Atherosclerotic heart disease of native coronary artery without angina pectoris: Secondary | ICD-10-CM | POA: Diagnosis not present

## 2022-10-28 DIAGNOSIS — M810 Age-related osteoporosis without current pathological fracture: Secondary | ICD-10-CM | POA: Diagnosis not present

## 2022-10-30 ENCOUNTER — Ambulatory Visit: Payer: Medicare Other | Attending: Internal Medicine | Admitting: *Deleted

## 2022-10-30 DIAGNOSIS — I480 Paroxysmal atrial fibrillation: Secondary | ICD-10-CM

## 2022-10-30 DIAGNOSIS — Z5181 Encounter for therapeutic drug level monitoring: Secondary | ICD-10-CM

## 2022-10-30 LAB — POCT INR: POC INR: 1.9

## 2022-10-30 NOTE — Patient Instructions (Signed)
Description   Take 2 tablets of warfarin today then continue taking warfarin 1.5 tablets daily except on Sunday and Thursday take 2 tablets.  Recheck INR in 4  weeks.  [facility] [phone]

## 2022-11-03 DIAGNOSIS — I951 Orthostatic hypotension: Secondary | ICD-10-CM | POA: Diagnosis not present

## 2022-11-03 DIAGNOSIS — T466X5A Adverse effect of antihyperlipidemic and antiarteriosclerotic drugs, initial encounter: Secondary | ICD-10-CM | POA: Diagnosis not present

## 2022-11-03 DIAGNOSIS — Z86711 Personal history of pulmonary embolism: Secondary | ICD-10-CM | POA: Diagnosis not present

## 2022-11-03 DIAGNOSIS — I2510 Atherosclerotic heart disease of native coronary artery without angina pectoris: Secondary | ICD-10-CM | POA: Diagnosis not present

## 2022-11-03 DIAGNOSIS — I4891 Unspecified atrial fibrillation: Secondary | ICD-10-CM | POA: Diagnosis not present

## 2022-11-03 DIAGNOSIS — I10 Essential (primary) hypertension: Secondary | ICD-10-CM | POA: Diagnosis not present

## 2022-11-03 DIAGNOSIS — Z951 Presence of aortocoronary bypass graft: Secondary | ICD-10-CM | POA: Diagnosis not present

## 2022-11-03 DIAGNOSIS — M791 Myalgia, unspecified site: Secondary | ICD-10-CM | POA: Diagnosis not present

## 2022-11-03 DIAGNOSIS — E782 Mixed hyperlipidemia: Secondary | ICD-10-CM | POA: Diagnosis not present

## 2022-11-10 DIAGNOSIS — E782 Mixed hyperlipidemia: Secondary | ICD-10-CM | POA: Diagnosis not present

## 2022-11-10 DIAGNOSIS — T466X5A Adverse effect of antihyperlipidemic and antiarteriosclerotic drugs, initial encounter: Secondary | ICD-10-CM | POA: Diagnosis not present

## 2022-11-10 DIAGNOSIS — I10 Essential (primary) hypertension: Secondary | ICD-10-CM | POA: Diagnosis not present

## 2022-11-10 DIAGNOSIS — Z951 Presence of aortocoronary bypass graft: Secondary | ICD-10-CM | POA: Diagnosis not present

## 2022-11-10 DIAGNOSIS — M791 Myalgia, unspecified site: Secondary | ICD-10-CM | POA: Diagnosis not present

## 2022-11-10 DIAGNOSIS — Z86711 Personal history of pulmonary embolism: Secondary | ICD-10-CM | POA: Diagnosis not present

## 2022-11-10 DIAGNOSIS — I2510 Atherosclerotic heart disease of native coronary artery without angina pectoris: Secondary | ICD-10-CM | POA: Diagnosis not present

## 2022-11-10 DIAGNOSIS — I4891 Unspecified atrial fibrillation: Secondary | ICD-10-CM | POA: Diagnosis not present

## 2022-11-10 DIAGNOSIS — I951 Orthostatic hypotension: Secondary | ICD-10-CM | POA: Diagnosis not present

## 2022-11-17 DIAGNOSIS — I10 Essential (primary) hypertension: Secondary | ICD-10-CM | POA: Diagnosis not present

## 2022-11-17 DIAGNOSIS — R42 Dizziness and giddiness: Secondary | ICD-10-CM | POA: Diagnosis not present

## 2022-11-27 ENCOUNTER — Ambulatory Visit: Payer: Medicare Other | Attending: Cardiology

## 2022-11-27 DIAGNOSIS — Z5181 Encounter for therapeutic drug level monitoring: Secondary | ICD-10-CM

## 2022-11-27 DIAGNOSIS — I480 Paroxysmal atrial fibrillation: Secondary | ICD-10-CM | POA: Diagnosis not present

## 2022-11-27 LAB — POCT INR: INR: 2.2 (ref 2.0–3.0)

## 2022-11-27 NOTE — Patient Instructions (Signed)
continue taking warfarin 1.5 tablets daily except on Sunday and Thursday take 2 tablets.  Recheck INR in 6  weeks.  [facility]madin Clinic [phone]

## 2022-12-14 DIAGNOSIS — E039 Hypothyroidism, unspecified: Secondary | ICD-10-CM | POA: Diagnosis not present

## 2022-12-14 DIAGNOSIS — I10 Essential (primary) hypertension: Secondary | ICD-10-CM | POA: Diagnosis not present

## 2022-12-15 DIAGNOSIS — Z85828 Personal history of other malignant neoplasm of skin: Secondary | ICD-10-CM | POA: Diagnosis not present

## 2022-12-15 DIAGNOSIS — D485 Neoplasm of uncertain behavior of skin: Secondary | ICD-10-CM | POA: Diagnosis not present

## 2022-12-15 DIAGNOSIS — L718 Other rosacea: Secondary | ICD-10-CM | POA: Diagnosis not present

## 2022-12-15 DIAGNOSIS — C44311 Basal cell carcinoma of skin of nose: Secondary | ICD-10-CM | POA: Diagnosis not present

## 2022-12-17 DIAGNOSIS — I10 Essential (primary) hypertension: Secondary | ICD-10-CM | POA: Diagnosis not present

## 2022-12-17 DIAGNOSIS — I2510 Atherosclerotic heart disease of native coronary artery without angina pectoris: Secondary | ICD-10-CM | POA: Diagnosis not present

## 2022-12-17 DIAGNOSIS — I6529 Occlusion and stenosis of unspecified carotid artery: Secondary | ICD-10-CM | POA: Diagnosis not present

## 2022-12-17 DIAGNOSIS — D689 Coagulation defect, unspecified: Secondary | ICD-10-CM | POA: Diagnosis not present

## 2022-12-17 DIAGNOSIS — K219 Gastro-esophageal reflux disease without esophagitis: Secondary | ICD-10-CM | POA: Diagnosis not present

## 2022-12-17 DIAGNOSIS — E039 Hypothyroidism, unspecified: Secondary | ICD-10-CM | POA: Diagnosis not present

## 2022-12-17 DIAGNOSIS — Z951 Presence of aortocoronary bypass graft: Secondary | ICD-10-CM | POA: Diagnosis not present

## 2022-12-17 DIAGNOSIS — M810 Age-related osteoporosis without current pathological fracture: Secondary | ICD-10-CM | POA: Diagnosis not present

## 2022-12-17 DIAGNOSIS — Z0000 Encounter for general adult medical examination without abnormal findings: Secondary | ICD-10-CM | POA: Diagnosis not present

## 2022-12-17 DIAGNOSIS — R7989 Other specified abnormal findings of blood chemistry: Secondary | ICD-10-CM | POA: Diagnosis not present

## 2022-12-17 DIAGNOSIS — I4891 Unspecified atrial fibrillation: Secondary | ICD-10-CM | POA: Diagnosis not present

## 2022-12-17 DIAGNOSIS — D692 Other nonthrombocytopenic purpura: Secondary | ICD-10-CM | POA: Diagnosis not present

## 2023-01-01 DIAGNOSIS — I4891 Unspecified atrial fibrillation: Secondary | ICD-10-CM | POA: Diagnosis not present

## 2023-01-01 DIAGNOSIS — I10 Essential (primary) hypertension: Secondary | ICD-10-CM | POA: Diagnosis not present

## 2023-01-19 ENCOUNTER — Encounter (HOSPITAL_BASED_OUTPATIENT_CLINIC_OR_DEPARTMENT_OTHER): Payer: Self-pay | Admitting: Cardiovascular Disease

## 2023-01-21 ENCOUNTER — Institutional Professional Consult (permissible substitution) (HOSPITAL_BASED_OUTPATIENT_CLINIC_OR_DEPARTMENT_OTHER): Payer: Medicare Other | Admitting: Family

## 2023-01-22 ENCOUNTER — Emergency Department (HOSPITAL_COMMUNITY): Payer: Medicare Other

## 2023-01-22 ENCOUNTER — Other Ambulatory Visit: Payer: Self-pay

## 2023-01-22 ENCOUNTER — Inpatient Hospital Stay (HOSPITAL_COMMUNITY)
Admission: EM | Admit: 2023-01-22 | Discharge: 2023-01-28 | DRG: 394 | Disposition: A | Discharge status: Home / Self Care | Payer: Medicare Other | Attending: Internal Medicine | Admitting: Internal Medicine

## 2023-01-22 ENCOUNTER — Encounter (HOSPITAL_COMMUNITY): Payer: Self-pay

## 2023-01-22 DIAGNOSIS — I10 Essential (primary) hypertension: Secondary | ICD-10-CM | POA: Diagnosis not present

## 2023-01-22 DIAGNOSIS — D6832 Hemorrhagic disorder due to extrinsic circulating anticoagulants: Secondary | ICD-10-CM | POA: Diagnosis not present

## 2023-01-22 DIAGNOSIS — K219 Gastro-esophageal reflux disease without esophagitis: Secondary | ICD-10-CM | POA: Diagnosis present

## 2023-01-22 DIAGNOSIS — M19031 Primary osteoarthritis, right wrist: Secondary | ICD-10-CM | POA: Diagnosis not present

## 2023-01-22 DIAGNOSIS — I2510 Atherosclerotic heart disease of native coronary artery without angina pectoris: Secondary | ICD-10-CM | POA: Diagnosis not present

## 2023-01-22 DIAGNOSIS — Z8546 Personal history of malignant neoplasm of prostate: Secondary | ICD-10-CM

## 2023-01-22 DIAGNOSIS — Z883 Allergy status to other anti-infective agents status: Secondary | ICD-10-CM

## 2023-01-22 DIAGNOSIS — Z86010 Personal history of colonic polyps: Secondary | ICD-10-CM

## 2023-01-22 DIAGNOSIS — K317 Polyp of stomach and duodenum: Secondary | ICD-10-CM | POA: Diagnosis not present

## 2023-01-22 DIAGNOSIS — N179 Acute kidney failure, unspecified: Secondary | ICD-10-CM | POA: Diagnosis present

## 2023-01-22 DIAGNOSIS — R0781 Pleurodynia: Secondary | ICD-10-CM | POA: Diagnosis not present

## 2023-01-22 DIAGNOSIS — I6523 Occlusion and stenosis of bilateral carotid arteries: Secondary | ICD-10-CM | POA: Diagnosis not present

## 2023-01-22 DIAGNOSIS — Z79890 Hormone replacement therapy (postmenopausal): Secondary | ICD-10-CM | POA: Diagnosis not present

## 2023-01-22 DIAGNOSIS — Z888 Allergy status to other drugs, medicaments and biological substances status: Secondary | ICD-10-CM

## 2023-01-22 DIAGNOSIS — I739 Peripheral vascular disease, unspecified: Secondary | ICD-10-CM | POA: Diagnosis not present

## 2023-01-22 DIAGNOSIS — Z79899 Other long term (current) drug therapy: Secondary | ICD-10-CM | POA: Diagnosis not present

## 2023-01-22 DIAGNOSIS — W19XXXA Unspecified fall, initial encounter: Secondary | ICD-10-CM | POA: Diagnosis present

## 2023-01-22 DIAGNOSIS — M19011 Primary osteoarthritis, right shoulder: Secondary | ICD-10-CM | POA: Diagnosis not present

## 2023-01-22 DIAGNOSIS — Z87891 Personal history of nicotine dependence: Secondary | ICD-10-CM

## 2023-01-22 DIAGNOSIS — I959 Hypotension, unspecified: Secondary | ICD-10-CM | POA: Diagnosis not present

## 2023-01-22 DIAGNOSIS — R109 Unspecified abdominal pain: Secondary | ICD-10-CM | POA: Diagnosis present

## 2023-01-22 DIAGNOSIS — I4891 Unspecified atrial fibrillation: Secondary | ICD-10-CM | POA: Diagnosis not present

## 2023-01-22 DIAGNOSIS — K259 Gastric ulcer, unspecified as acute or chronic, without hemorrhage or perforation: Secondary | ICD-10-CM | POA: Diagnosis not present

## 2023-01-22 DIAGNOSIS — K254 Chronic or unspecified gastric ulcer with hemorrhage: Secondary | ICD-10-CM | POA: Diagnosis not present

## 2023-01-22 DIAGNOSIS — M7918 Myalgia, other site: Secondary | ICD-10-CM | POA: Diagnosis present

## 2023-01-22 DIAGNOSIS — K5730 Diverticulosis of large intestine without perforation or abscess without bleeding: Secondary | ICD-10-CM | POA: Diagnosis present

## 2023-01-22 DIAGNOSIS — I129 Hypertensive chronic kidney disease with stage 1 through stage 4 chronic kidney disease, or unspecified chronic kidney disease: Secondary | ICD-10-CM | POA: Diagnosis present

## 2023-01-22 DIAGNOSIS — Z951 Presence of aortocoronary bypass graft: Secondary | ICD-10-CM | POA: Diagnosis not present

## 2023-01-22 DIAGNOSIS — Z823 Family history of stroke: Secondary | ICD-10-CM

## 2023-01-22 DIAGNOSIS — N189 Chronic kidney disease, unspecified: Secondary | ICD-10-CM | POA: Diagnosis not present

## 2023-01-22 DIAGNOSIS — R55 Syncope and collapse: Secondary | ICD-10-CM | POA: Diagnosis not present

## 2023-01-22 DIAGNOSIS — R58 Hemorrhage, not elsewhere classified: Secondary | ICD-10-CM | POA: Diagnosis not present

## 2023-01-22 DIAGNOSIS — R531 Weakness: Secondary | ICD-10-CM | POA: Diagnosis not present

## 2023-01-22 DIAGNOSIS — Z7901 Long term (current) use of anticoagulants: Secondary | ICD-10-CM | POA: Diagnosis not present

## 2023-01-22 DIAGNOSIS — I480 Paroxysmal atrial fibrillation: Secondary | ICD-10-CM | POA: Diagnosis present

## 2023-01-22 DIAGNOSIS — N1831 Chronic kidney disease, stage 3a: Secondary | ICD-10-CM | POA: Diagnosis present

## 2023-01-22 DIAGNOSIS — Z952 Presence of prosthetic heart valve: Secondary | ICD-10-CM

## 2023-01-22 DIAGNOSIS — M25531 Pain in right wrist: Secondary | ICD-10-CM | POA: Diagnosis not present

## 2023-01-22 DIAGNOSIS — D72828 Other elevated white blood cell count: Secondary | ICD-10-CM | POA: Diagnosis present

## 2023-01-22 DIAGNOSIS — Z86711 Personal history of pulmonary embolism: Secondary | ICD-10-CM

## 2023-01-22 DIAGNOSIS — S199XXA Unspecified injury of neck, initial encounter: Secondary | ICD-10-CM | POA: Diagnosis not present

## 2023-01-22 DIAGNOSIS — Z9079 Acquired absence of other genital organ(s): Secondary | ICD-10-CM

## 2023-01-22 DIAGNOSIS — S0990XA Unspecified injury of head, initial encounter: Secondary | ICD-10-CM | POA: Diagnosis not present

## 2023-01-22 DIAGNOSIS — Z86718 Personal history of other venous thrombosis and embolism: Secondary | ICD-10-CM

## 2023-01-22 DIAGNOSIS — D62 Acute posthemorrhagic anemia: Secondary | ICD-10-CM | POA: Diagnosis not present

## 2023-01-22 DIAGNOSIS — K920 Hematemesis: Secondary | ICD-10-CM | POA: Diagnosis not present

## 2023-01-22 DIAGNOSIS — R112 Nausea with vomiting, unspecified: Secondary | ICD-10-CM | POA: Diagnosis not present

## 2023-01-22 DIAGNOSIS — E785 Hyperlipidemia, unspecified: Secondary | ICD-10-CM | POA: Diagnosis present

## 2023-01-22 DIAGNOSIS — E039 Hypothyroidism, unspecified: Secondary | ICD-10-CM | POA: Diagnosis not present

## 2023-01-22 DIAGNOSIS — Z8672 Personal history of thrombophlebitis: Secondary | ICD-10-CM

## 2023-01-22 DIAGNOSIS — K449 Diaphragmatic hernia without obstruction or gangrene: Secondary | ICD-10-CM | POA: Diagnosis not present

## 2023-01-22 DIAGNOSIS — F17210 Nicotine dependence, cigarettes, uncomplicated: Secondary | ICD-10-CM | POA: Diagnosis not present

## 2023-01-22 DIAGNOSIS — Z8042 Family history of malignant neoplasm of prostate: Secondary | ICD-10-CM

## 2023-01-22 DIAGNOSIS — K922 Gastrointestinal hemorrhage, unspecified: Secondary | ICD-10-CM | POA: Diagnosis not present

## 2023-01-22 DIAGNOSIS — R079 Chest pain, unspecified: Secondary | ICD-10-CM | POA: Diagnosis not present

## 2023-01-22 DIAGNOSIS — R1111 Vomiting without nausea: Secondary | ICD-10-CM | POA: Diagnosis not present

## 2023-01-22 DIAGNOSIS — Z91041 Radiographic dye allergy status: Secondary | ICD-10-CM

## 2023-01-22 DIAGNOSIS — Z8249 Family history of ischemic heart disease and other diseases of the circulatory system: Secondary | ICD-10-CM

## 2023-01-22 DIAGNOSIS — K921 Melena: Secondary | ICD-10-CM | POA: Diagnosis not present

## 2023-01-22 DIAGNOSIS — Z8719 Personal history of other diseases of the digestive system: Secondary | ICD-10-CM

## 2023-01-22 DIAGNOSIS — T45515A Adverse effect of anticoagulants, initial encounter: Secondary | ICD-10-CM | POA: Diagnosis present

## 2023-01-22 LAB — PROTIME-INR
INR: 1.8 — ABNORMAL HIGH (ref 0.8–1.2)
Prothrombin Time: 21.1 seconds — ABNORMAL HIGH (ref 11.4–15.2)

## 2023-01-22 LAB — LACTIC ACID, PLASMA
Lactic Acid, Venous: 1.9 mmol/L (ref 0.5–1.9)
Lactic Acid, Venous: 1.2 mmol/L (ref 0.5–1.9)

## 2023-01-22 LAB — I-STAT CHEM 8, ED
BUN: 47 mg/dL — ABNORMAL HIGH (ref 8–23)
Calcium, Ion: 1.16 mmol/L (ref 1.15–1.40)
Chloride: 110 mmol/L (ref 98–111)
Creatinine, Ser: 1.8 mg/dL — ABNORMAL HIGH (ref 0.61–1.24)
Glucose, Bld: 122 mg/dL — ABNORMAL HIGH (ref 70–99)
HCT: 35 % — ABNORMAL LOW (ref 39.0–52.0)
Hemoglobin: 11.9 g/dL — ABNORMAL LOW (ref 13.0–17.0)
Potassium: 5.6 mmol/L — ABNORMAL HIGH (ref 3.5–5.1)
Sodium: 140 mmol/L (ref 135–145)
TCO2: 19 mmol/L — ABNORMAL LOW (ref 22–32)

## 2023-01-22 LAB — COMPREHENSIVE METABOLIC PANEL
ALT: 28 U/L (ref 0–44)
AST: 28 U/L (ref 15–41)
Albumin: 3.5 g/dL (ref 3.5–5.0)
Alkaline Phosphatase: 87 U/L (ref 38–126)
Anion gap: 8 (ref 5–15)
BUN: 53 mg/dL — ABNORMAL HIGH (ref 8–23)
CO2: 21 mmol/L — ABNORMAL LOW (ref 22–32)
Calcium: 8.6 mg/dL — ABNORMAL LOW (ref 8.9–10.3)
Chloride: 109 mmol/L (ref 98–111)
Creatinine, Ser: 1.82 mg/dL — ABNORMAL HIGH (ref 0.61–1.24)
GFR, Estimated: 36 mL/min — ABNORMAL LOW (ref >60)
Glucose, Bld: 132 mg/dL — ABNORMAL HIGH (ref 70–99)
Potassium: 5.5 mmol/L — ABNORMAL HIGH (ref 3.5–5.1)
Sodium: 138 mmol/L (ref 135–145)
Total Bilirubin: 0.4 mg/dL (ref 0.3–1.2)
Total Protein: 6.2 g/dL — ABNORMAL LOW (ref 6.5–8.1)

## 2023-01-22 LAB — TYPE AND SCREEN
ABO/RH(D): O POS
Antibody Screen: NEGATIVE

## 2023-01-22 LAB — CBC WITH DIFFERENTIAL/PLATELET
Abs Immature Granulocytes: 0.11 10*3/uL — ABNORMAL HIGH (ref 0.00–0.07)
Basophils Absolute: 0 10*3/uL (ref 0.0–0.1)
Basophils Relative: 0 %
Eosinophils Absolute: 0 10*3/uL (ref 0.0–0.5)
Eosinophils Relative: 0 %
HCT: 36.2 % — ABNORMAL LOW (ref 39.0–52.0)
Hemoglobin: 11.5 g/dL — ABNORMAL LOW (ref 13.0–17.0)
Immature Granulocytes: 1 %
Lymphocytes Relative: 6 %
Lymphs Abs: 0.8 10*3/uL (ref 0.7–4.0)
MCH: 27.8 pg (ref 26.0–34.0)
MCHC: 31.8 g/dL (ref 30.0–36.0)
MCV: 87.7 fL (ref 80.0–100.0)
Monocytes Absolute: 0.6 10*3/uL (ref 0.1–1.0)
Monocytes Relative: 4 %
Neutro Abs: 12.1 10*3/uL — ABNORMAL HIGH (ref 1.7–7.7)
Neutrophils Relative %: 89 %
Platelets: 288 10*3/uL (ref 150–400)
RBC: 4.13 MIL/uL — ABNORMAL LOW (ref 4.22–5.81)
RDW: 15.2 % (ref 11.5–15.5)
WBC: 13.7 10*3/uL — ABNORMAL HIGH (ref 4.0–10.5)
nRBC: 0 % (ref 0.0–0.2)

## 2023-01-22 LAB — TROPONIN I (HIGH SENSITIVITY)
Troponin I (High Sensitivity): 14 ng/L (ref <18)
Troponin I (High Sensitivity): 17 ng/L (ref <18)

## 2023-01-22 LAB — POC OCCULT BLOOD, ED: Fecal Occult Bld: POSITIVE — AB

## 2023-01-22 LAB — TSH: TSH: 3.665 u[IU]/mL (ref 0.350–4.500)

## 2023-01-22 MED ORDER — ONDANSETRON HCL 4 MG/2ML IJ SOLN
4.0000 mg | Freq: Once | INTRAMUSCULAR | Status: AC
  Administered 2023-01-22: 4 mg via INTRAVENOUS
  Filled 2023-01-22: qty 2

## 2023-01-22 MED ORDER — LACTATED RINGERS IV BOLUS
1000.0000 mL | Freq: Once | INTRAVENOUS | Status: AC
  Administered 2023-01-22: 1000 mL via INTRAVENOUS

## 2023-01-22 MED ORDER — PANTOPRAZOLE SODIUM 40 MG IV SOLR: 40.0000 mg | Freq: Once | INTRAVENOUS | Status: DC

## 2023-01-22 MED ORDER — PANTOPRAZOLE 80MG IVPB - SIMPLE MED
80.0000 mg | Freq: Once | INTRAVENOUS | Status: AC
  Administered 2023-01-22: 80 mg via INTRAVENOUS
  Filled 2023-01-22: qty 100

## 2023-01-22 MED ORDER — INDAPAMIDE 1.25 MG PO TABS
0.6250 mg | ORAL_TABLET | Freq: Every day | ORAL | Status: DC
  Administered 2023-01-23 – 2023-01-28 (×6): 0.625 mg via ORAL
  Filled 2023-01-22 (×7): qty 1

## 2023-01-22 MED ORDER — ACETAMINOPHEN 325 MG PO TABS
650.0000 mg | ORAL_TABLET | Freq: Four times a day (QID) | ORAL | Status: DC | PRN
  Administered 2023-01-23 – 2023-01-26 (×6): 650 mg via ORAL
  Filled 2023-01-22 (×6): qty 2

## 2023-01-22 MED ORDER — FENTANYL CITRATE PF 50 MCG/ML IJ SOSY
50.0000 ug | PREFILLED_SYRINGE | Freq: Once | INTRAMUSCULAR | Status: AC
  Administered 2023-01-22: 50 ug via INTRAVENOUS

## 2023-01-22 MED ORDER — AMIODARONE HCL 200 MG PO TABS
200.0000 mg | ORAL_TABLET | Freq: Every day | ORAL | Status: DC
  Administered 2023-01-23 – 2023-01-28 (×6): 200 mg via ORAL
  Filled 2023-01-22 (×6): qty 1

## 2023-01-22 MED ORDER — FENTANYL CITRATE PF 50 MCG/ML IJ SOSY
12.5000 ug | PREFILLED_SYRINGE | INTRAMUSCULAR | Status: DC | PRN
  Administered 2023-01-23 – 2023-01-26 (×2): 12.5 ug via INTRAVENOUS
  Filled 2023-01-22 (×2): qty 1

## 2023-01-22 MED ORDER — ONDANSETRON HCL 4 MG PO TABS: 4.0000 mg | ORAL_TABLET | Freq: Four times a day (QID) | ORAL | Status: DC | PRN

## 2023-01-22 MED ORDER — EZETIMIBE 10 MG PO TABS
10.0000 mg | ORAL_TABLET | Freq: Every day | ORAL | Status: DC
  Administered 2023-01-22 – 2023-01-27 (×6): 10 mg via ORAL
  Filled 2023-01-22 (×7): qty 1

## 2023-01-22 MED ORDER — ONDANSETRON HCL 4 MG/2ML IJ SOLN: 4.0000 mg | Freq: Four times a day (QID) | INTRAMUSCULAR | Status: DC | PRN

## 2023-01-22 MED ORDER — LEVOTHYROXINE SODIUM 25 MCG PO TABS
25.0000 ug | ORAL_TABLET | Freq: Every day | ORAL | Status: DC
  Administered 2023-01-23 – 2023-01-28 (×5): 25 ug via ORAL
  Filled 2023-01-22 (×5): qty 1

## 2023-01-22 MED ORDER — ONDANSETRON HCL 4 MG/2ML IJ SOLN
4.0000 mg | Freq: Once | INTRAMUSCULAR | Status: AC
  Administered 2023-01-22: 4 mg via INTRAVENOUS

## 2023-01-22 MED ORDER — ALBUTEROL SULFATE (2.5 MG/3ML) 0.083% IN NEBU: 2.5000 mg | INHALATION_SOLUTION | RESPIRATORY_TRACT | Status: DC | PRN

## 2023-01-22 MED ORDER — VITAMIN D 25 MCG (1000 UNIT) PO TABS
5000.0000 [IU] | ORAL_TABLET | Freq: Every day | ORAL | Status: DC
  Administered 2023-01-23 – 2023-01-28 (×6): 5000 [IU] via ORAL
  Filled 2023-01-22 (×6): qty 5

## 2023-01-22 MED ORDER — MORPHINE SULFATE (PF) 4 MG/ML IV SOLN
4.0000 mg | Freq: Once | INTRAVENOUS | Status: AC
  Administered 2023-01-22: 4 mg via INTRAVENOUS
  Filled 2023-01-22: qty 1

## 2023-01-22 MED ORDER — AMLODIPINE BESYLATE 5 MG PO TABS
5.0000 mg | ORAL_TABLET | Freq: Every day | ORAL | Status: DC
  Administered 2023-01-23 – 2023-01-28 (×6): 5 mg via ORAL
  Filled 2023-01-22 (×6): qty 1

## 2023-01-22 MED ORDER — SODIUM CHLORIDE 0.45 % IV SOLN: INTRAVENOUS | Status: AC

## 2023-01-22 MED ORDER — ACETAMINOPHEN 650 MG RE SUPP: 650.0000 mg | Freq: Four times a day (QID) | RECTAL | Status: DC | PRN

## 2023-01-22 NOTE — ED Notes (Signed)
ED TO INPATIENT HANDOFF REPORT  ED Nurse Name and Phone #: Delfin Edis - [phone]  S Name/Age/Gender John [name] 87 y.o. male Room/Bed: 038C/038C  Code Status   Code Status: Full Code  Home/SNF/Other Home Patient oriented to: self, place, time, and situation Is this baseline? Yes   Triage Complete: Triage complete  Chief Complaint GI bleed [K92.2]  Triage Note No notes on file   Allergies Allergies  Allergen Reactions   Other Other (See Comments)   Iodine Hives   Iohexol Hives     PT ALLERGIC TO IV CONTRAST;(REACTION DURING CARDIAC CATHETERIZATION); NEEDS PREMEDS PER DR CRENSHAW!, Onset Date: [id number]    Statins Other (See Comments)    Joint aches with Zocor, Lipitor, Crestor    Level of Care/Admitting Diagnosis ED Disposition     ED Disposition  Admit   Condition  --   Comment  Hospital Area: [facility] [[id number]]  Level of Care: Progressive [102]  Admit to Progressive based on following criteria: GI, ENDOCRINE disease patients with GI bleeding, acute liver failure or pancreatitis, stable with diabetic ketoacidosis or thyrotoxicosis (hypothyroid) state.  May admit patient to [facility] or [facility] if equivalent level of care is available:: No  Covid Evaluation: Asymptomatic - no recent exposure (last 10 days) testing not required  Diagnosis: GI bleed [725366]  Admitting Physician: Lurline Del [[id number]]  Attending Physician: Lurline Del [[id number]]  Certification:: I certify this patient will need inpatient services for at least 2 midnights  Estimated Length of Stay: 3          B Medical/Surgery History Past Medical History:  Diagnosis Date   Anxiety    Atrial fibrillation (HCC)    CAD (coronary artery disease)    s/p CABG   Carotid stenosis    mild by doppler   Complication of anesthesia    zenkers  diverticulum   Deep vein thrombophlebitis of leg (HCC)    DJD (degenerative joint disease)    Elevated  TSH    secondary to Amio-normal free T4.    Essential hypertension, benign    GERD (gastroesophageal reflux disease)    Hemorrhoids    Hiatal hernia    Hyperlipidemia    PAF (paroxysmal atrial fibrillation) (HCC)    Amiodarone   PE (pulmonary embolism)    pre 2007. Chronic anticoagulation   Prostate cancer (HCC)    Sebaceous cyst    neck   Seborrheic dermatitis    Tubular adenoma of colon 05/2011   Past Surgical History:  Procedure Laterality Date   bilateral shoulder surgery     CORONARY ARTERY BYPASS GRAFT     x2    2009   I & D EXTREMITY  07/09/2011   Procedure: IRRIGATION AND DEBRIDEMENT EXTREMITY;  Surgeon: Tami Ribas;  Location: [facility];  Service: Orthopedics;  Laterality: Left;   left knee arthroscopy     MITRAL VALVE REPAIR     prostate cancer surgery  11/1996   radial retropubic prostatectomy   TANDEM AND OVOID INSERTION     VASECTOMY     ZENKER'S DIVERTICULECTOMY N/A 11/07/2012   Procedure: ZENKER'S DIVERTICULECTOMY ENDOSCOPIC;  Surgeon: Christia Reading, MD;  Location: [facility];  Service: ENT;  Laterality: N/A;     A IV Location/Drains/Wounds Patient Lines/Drains/Airways Status     Active Line/Drains/Airways     Name Placement date Placement time Site Days   Peripheral IV 01/22/23 18 G Anterior;Distal;Left;Upper Arm 01/22/23  --  Arm  less than 1            Intake/Output Last 24 hours  Intake/Output Summary (Last 24 hours) at 01/22/2023 2119 Last data filed at 01/22/2023 1941 Gross per 24 hour  Intake 1000 ml  Output --  Net 1000 ml    Labs/Imaging Results for orders placed or performed during the hospital encounter of 01/22/23 (from the past 48 hour(s))  CBC with Differential     Status: Abnormal   Collection Time: 01/22/23  3:35 PM  Result Value Ref Range   WBC 13.7 (H) 4.0 - 10.5 K/uL   RBC 4.13 (L) 4.22 - 5.81 MIL/uL   Hemoglobin 11.5 (L) 13.0 - 17.0 g/dL   HCT 36.6 (L) 44.0 - 34.7 %   MCV 87.7 80.0 - 100.0 fL   MCH 27.8 26.0  - 34.0 pg   MCHC 31.8 30.0 - 36.0 g/dL   RDW 42.5 95.6 - 38.7 %   Platelets 288 150 - 400 K/uL   nRBC 0.0 0.0 - 0.2 %   Neutrophils Relative % 89 %   Neutro Abs 12.1 (H) 1.7 - 7.7 K/uL   Lymphocytes Relative 6 %   Lymphs Abs 0.8 0.7 - 4.0 K/uL   Monocytes Relative 4 %   Monocytes Absolute 0.6 0.1 - 1.0 K/uL   Eosinophils Relative 0 %   Eosinophils Absolute 0.0 0.0 - 0.5 K/uL   Basophils Relative 0 %   Basophils Absolute 0.0 0.0 - 0.1 K/uL   Immature Granulocytes 1 %   Abs Immature Granulocytes 0.11 (H) 0.00 - 0.07 K/uL    Comment: Performed at [facility], [address], [place], [place] [address]  Comprehensive metabolic panel     Status: Abnormal   Collection Time: 01/22/23  3:35 PM  Result Value Ref Range   Sodium 138 135 - 145 mmol/L   Potassium 5.5 (H) 3.5 - 5.1 mmol/L   Chloride 109 98 - 111 mmol/L   CO2 21 (L) 22 - 32 mmol/L   Glucose, Bld 132 (H) 70 - 99 mg/dL    Comment: Glucose reference range applies only to samples taken after fasting for at least 8 hours.   BUN 53 (H) 8 - 23 mg/dL   Creatinine, Ser 2.95 (H) 0.61 - 1.24 mg/dL   Calcium 8.6 (L) 8.9 - 10.3 mg/dL   Total Protein 6.2 (L) 6.5 - 8.1 g/dL   Albumin 3.5 3.5 - 5.0 g/dL   AST 28 15 - 41 U/L   ALT 28 0 - 44 U/L   Alkaline Phosphatase 87 38 - 126 U/L   Total Bilirubin 0.4 0.3 - 1.2 mg/dL   GFR, Estimated 36 (L) >60 mL/min    Comment: (NOTE) Calculated using the CKD-EPI Creatinine Equation (2021)    Anion gap 8 5 - 15    Comment: Performed at [facility], [address], [place], [place] [address]  Type and screen [facility]     Status: None   Collection Time: 01/22/23  3:35 PM  Result Value Ref Range   ABO/RH(D) O POS    Antibody Screen NEG    Sample Expiration      01/25/2023,2359 Performed at [facility], [address], [place], [place] [address]   Protime-INR     Status: Abnormal   Collection Time: 01/22/23  3:35 PM  Result Value Ref Range   Prothrombin  Time 21.1 (H) 11.4 - 15.2 seconds   INR  1.8 (H) 0.8 - 1.2    Comment: (NOTE) INR goal varies based on device and disease states. Performed at [facility], [address] [place], [place] [address]   Troponin I (High Sensitivity)     Status: None   Collection Time: 01/22/23  3:35 PM  Result Value Ref Range   Troponin I (High Sensitivity) 14 <18 ng/L    Comment: (NOTE) Elevated high sensitivity troponin I (hsTnI) values and significant  changes across serial measurements may suggest ACS but many other  chronic and acute conditions are known to elevate hsTnI results.  Refer to the "Links" section for chest pain algorithms and additional  guidance. Performed at [facility], [address], [place], [place] [address]   I-stat chem 8, ED (not at [facility], [facility] or [facility])     Status: Abnormal   Collection Time: 01/22/23  3:49 PM  Result Value Ref Range   Sodium 140 135 - 145 mmol/L   Potassium 5.6 (H) 3.5 - 5.1 mmol/L   Chloride 110 98 - 111 mmol/L   BUN 47 (H) 8 - 23 mg/dL   Creatinine, Ser 7.84 (H) 0.61 - 1.24 mg/dL   Glucose, Bld 696 (H) 70 - 99 mg/dL    Comment: Glucose reference range applies only to samples taken after fasting for at least 8 hours.   Calcium, Ion 1.16 1.15 - 1.40 mmol/L   TCO2 19 (L) 22 - 32 mmol/L   Hemoglobin 11.9 (L) 13.0 - 17.0 g/dL   HCT 29.5 (L) 28.4 - 13.2 %  Troponin I (High Sensitivity)     Status: None   Collection Time: 01/22/23  5:52 PM  Result Value Ref Range   Troponin I (High Sensitivity) 17 <18 ng/L    Comment: (NOTE) Elevated high sensitivity troponin I (hsTnI) values and significant  changes across serial measurements may suggest ACS but many other  chronic and acute conditions are known to elevate hsTnI results.  Refer to the "Links" section for chest pain algorithms and additional  guidance. Performed at [facility], [address] [place], [place] [address]   POC occult blood, ED Provider will collect     Status:  Abnormal   Collection Time: 01/22/23  6:12 PM  Result Value Ref Range   Fecal Occult Bld POSITIVE (A) NEGATIVE  Lactic acid, plasma     Status: None   Collection Time: 01/22/23  7:34 PM  Result Value Ref Range   Lactic Acid, Venous 1.9 0.5 - 1.9 mmol/L    Comment: Performed at [facility], [address] [place], [place] [address]   CT ABDOMEN PELVIS WO CONTRAST  Result Date: 01/22/2023 CLINICAL DATA:  Abdominal pain, acute, nonlocalized. Rectal bleeding. Evaluate for colitis. EXAM: CT ABDOMEN AND PELVIS WITHOUT CONTRAST TECHNIQUE: Multidetector CT imaging of the abdomen and pelvis was performed following the standard protocol without IV contrast. RADIATION DOSE REDUCTION: This exam was performed according to the departmental dose-optimization program which includes automated exposure control, adjustment of the mA and/or kV according to patient size and/or use of iterative reconstruction technique. COMPARISON:  None Available. FINDINGS: Lower chest: Mild scarring at both lung bases. Atherosclerosis of the aorta and coronary arteries status post median sternotomy and probable mitral valve replacement. The heart size is normal. There is a small hiatal hernia. Hepatobiliary: The liver appears unremarkable as imaged in the noncontrast state. No evidence of gallstones, gallbladder wall thickening or biliary dilatation. Pancreas: Unremarkable. No pancreatic ductal  dilatation or surrounding inflammatory changes. Spleen: Normal in size without focal abnormality. Adrenals/Urinary Tract: Both adrenal glands appear normal. No evidence of urinary tract calculus, suspicious renal lesion or hydronephrosis. The bladder appears unremarkable for its degree of distention. Stomach/Bowel: No enteric contrast administered. The stomach appears unremarkable for its degree of distension. No evidence of bowel wall thickening, distention or surrounding inflammatory change. The appendix appears normal. Mild descending and  sigmoid colon diverticulosis without evidence of acute inflammation. Vascular/Lymphatic: There are no enlarged abdominal or pelvic lymph nodes. Aortic and branch vessel atherosclerosis without evidence of aneurysm. Postsurgical changes in both inguinal regions. Reproductive: Status post prostatectomy. No evidence of pelvic mass. Other: No evidence of abdominal wall mass or hernia. No ascites or pneumoperitoneum. Musculoskeletal: No acute or significant osseous findings. Multilevel thoracolumbar spondylosis and ankylosis. The left sacroiliac joint is ankylosed. There are mild degenerative changes at both hips. Healed median sternotomy. Moderate bilateral gynecomastia. Unless specific follow-up recommendations are mentioned in the findings or impression sections, no imaging follow-up of any mentioned incidental findings is recommended. IMPRESSION: 1. No acute findings or explanation for the patient's symptoms. 2. Mild distal colonic diverticulosis without evidence of acute inflammation. Small hiatal hernia. 3. Status post prostatectomy without evidence of local recurrence or metastatic disease. 4.  Aortic Atherosclerosis (ICD10-I70.0). Electronically Signed   By: Carey Bullocks M.D.   On: 01/22/2023 18:43   CT Head Wo Contrast  Result Date: 01/22/2023 CLINICAL DATA:  Head trauma, minor (Age >= 65y); Neck trauma (Age >= 65y). Fall. EXAM: CT HEAD WITHOUT CONTRAST CT CERVICAL SPINE WITHOUT CONTRAST TECHNIQUE: Multidetector CT imaging of the head and cervical spine was performed following the standard protocol without intravenous contrast. Multiplanar CT image reconstructions of the cervical spine were also generated. RADIATION DOSE REDUCTION: This exam was performed according to the departmental dose-optimization program which includes automated exposure control, adjustment of the mA and/or kV according to patient size and/or use of iterative reconstruction technique. COMPARISON:  None Available. FINDINGS: CT HEAD  FINDINGS Brain: There is no evidence of an acute infarct, intracranial hemorrhage, mass, midline shift, or extra-axial fluid collection. The ventricles and sulci are within normal limits for age. Hypodensities in the cerebral white matter are nonspecific but compatible with very mild chronic small vessel ischemic disease. Vascular: Calcified atherosclerosis at the skull base. No hyperdense vessel. Skull: No acute fracture or suspicious osseous lesion. Sinuses/Orbits: Paranasal sinuses and mastoid air cells are clear. Bilateral cataract extraction. Other: None. CT CERVICAL SPINE FINDINGS Alignment: Cervical spine straightening.  No listhesis. Skull base and vertebrae: No acute fracture or suspicious osseous lesion. Soft tissues and spinal canal: No prevertebral fluid or swelling. No visible canal hematoma. Disc levels: Moderate diffuse cervical disc degeneration and severe facet arthrosis. Interbody and left facet ankylosis at C4-5. Moderate to severe multilevel neural foraminal stenosis. Mild-to-moderate spinal stenosis at C3-4 and C4-5. Upper chest: Clear lung apices. Other: Moderate calcific atherosclerosis at the carotid bifurcations. IMPRESSION: 1. No evidence of acute intracranial abnormality. 2. No acute cervical spine fracture. 3. Advanced cervical disc and facet degeneration. Electronically Signed   By: Sebastian Ache M.D.   On: 01/22/2023 18:39   CT Cervical Spine Wo Contrast  Result Date: 01/22/2023 CLINICAL DATA:  Head trauma, minor (Age >= 65y); Neck trauma (Age >= 65y). Fall. EXAM: CT HEAD WITHOUT CONTRAST CT CERVICAL SPINE WITHOUT CONTRAST TECHNIQUE: Multidetector CT imaging of the head and cervical spine was performed following the standard protocol without intravenous contrast. Multiplanar CT image reconstructions of the cervical  spine were also generated. RADIATION DOSE REDUCTION: This exam was performed according to the departmental dose-optimization program which includes automated exposure  control, adjustment of the mA and/or kV according to patient size and/or use of iterative reconstruction technique. COMPARISON:  None Available. FINDINGS: CT HEAD FINDINGS Brain: There is no evidence of an acute infarct, intracranial hemorrhage, mass, midline shift, or extra-axial fluid collection. The ventricles and sulci are within normal limits for age. Hypodensities in the cerebral white matter are nonspecific but compatible with very mild chronic small vessel ischemic disease. Vascular: Calcified atherosclerosis at the skull base. No hyperdense vessel. Skull: No acute fracture or suspicious osseous lesion. Sinuses/Orbits: Paranasal sinuses and mastoid air cells are clear. Bilateral cataract extraction. Other: None. CT CERVICAL SPINE FINDINGS Alignment: Cervical spine straightening.  No listhesis. Skull base and vertebrae: No acute fracture or suspicious osseous lesion. Soft tissues and spinal canal: No prevertebral fluid or swelling. No visible canal hematoma. Disc levels: Moderate diffuse cervical disc degeneration and severe facet arthrosis. Interbody and left facet ankylosis at C4-5. Moderate to severe multilevel neural foraminal stenosis. Mild-to-moderate spinal stenosis at C3-4 and C4-5. Upper chest: Clear lung apices. Other: Moderate calcific atherosclerosis at the carotid bifurcations. IMPRESSION: 1. No evidence of acute intracranial abnormality. 2. No acute cervical spine fracture. 3. Advanced cervical disc and facet degeneration. Electronically Signed   By: Sebastian Ache M.D.   On: 01/22/2023 18:39   DG Shoulder Right  Result Date: 01/22/2023 CLINICAL DATA:  Fall.  Syncope. EXAM: RIGHT SHOULDER - 2+ VIEW COMPARISON:  None Available. FINDINGS: The no signs of acute fracture or dislocation. Moderate to severe degenerative changes are identified at the acromioclavicular joint. Soft tissues are unremarkable. IMPRESSION: 1. No acute findings. 2. AC joint osteoarthritis. Electronically Signed   By: Signa Kell M.D.   On: 01/22/2023 16:06   DG Wrist Complete Right  Result Date: 01/22/2023 CLINICAL DATA:  Pain, fall. EXAM: RIGHT WRIST - COMPLETE 3 VIEW COMPARISON:  None Available. FINDINGS: There is no evidence of fracture or dislocation. Severe radiocarpal joint space narrowing and sclerosis consistent with osteoarthritis. IMPRESSION: Degenerative changes.  No acute osseous abnormalities. Electronically Signed   By: Layla Maw M.D.   On: 01/22/2023 16:04   DG Chest Portable 1 View  Result Date: 01/22/2023 CLINICAL DATA:  Chest pain, syncope. EXAM: PORTABLE CHEST 1 VIEW COMPARISON:  Nov 27, 2021. FINDINGS: The heart size and mediastinal contours are within normal limits. Status post coronary artery bypass graft. Both lungs are clear. The visualized skeletal structures are unremarkable. IMPRESSION: No active disease. Electronically Signed   By: Lupita Raider M.D.   On: 01/22/2023 15:43    Pending Labs Unresulted Labs (From admission, onward)     Start     Ordered   01/23/23 0500  Comprehensive metabolic panel  Tomorrow morning,   R        01/22/23 2042   01/22/23 2042  TSH  Once,   R        01/22/23 2042   01/22/23 2041  Hemoglobin and hematocrit, blood  Now then every 6 hours,   R      01/22/23 2042   01/22/23 1844  Lactic acid, plasma  Now then every 2 hours,   R      01/22/23 1843            Vitals/Pain Today's Vitals   01/22/23 1900 01/22/23 1936 01/22/23 2045 01/22/23 2050  BP: (!) 147/43 (!) 145/49 (!) 152/53  Pulse: 77 75 75   Resp: 18 16 18    Temp:  98.1 F (36.7 C) 98 F (36.7 C)   TempSrc:  Oral Oral   SpO2: 100% 100% 98%   Weight:      Height:      PainSc:    6     Isolation Precautions No active isolations  Medications Medications  amiodarone (PACERONE) tablet 200 mg (has no administration in time range)  amLODipine (NORVASC) tablet 5 mg (has no administration in time range)  ezetimibe (ZETIA) tablet 10 mg (has no administration in time range)   indapamide (LOZOL) tablet 0.625 mg (has no administration in time range)  levothyroxine (SYNTHROID) tablet 25 mcg (has no administration in time range)  Vitamin D-3 TABS (has no administration in time range)  0.45 % sodium chloride infusion (has no administration in time range)  acetaminophen (TYLENOL) tablet 650 mg (has no administration in time range)    Or  acetaminophen (TYLENOL) suppository 650 mg (has no administration in time range)  fentaNYL (SUBLIMAZE) injection 12.5 mcg (has no administration in time range)  ondansetron (ZOFRAN) tablet 4 mg (has no administration in time range)    Or  ondansetron (ZOFRAN) injection 4 mg (has no administration in time range)  albuterol (PROVENTIL) (2.5 MG/3ML) 0.083% nebulizer solution 2.5 mg (has no administration in time range)  fentaNYL (SUBLIMAZE) injection 50 mcg (50 mcg Intravenous Given 01/22/23 1612)  ondansetron (ZOFRAN) injection 4 mg (4 mg Intravenous Given 01/22/23 1612)  pantoprazole (PROTONIX) 80 mg /NS 100 mL IVPB (0 mg Intravenous Stopped 01/22/23 1704)  lactated ringers bolus 1,000 mL (0 mLs Intravenous Stopped 01/22/23 1941)  morphine (PF) 4 MG/ML injection 4 mg (4 mg Intravenous Given 01/22/23 2020)  ondansetron (ZOFRAN) injection 4 mg (4 mg Intravenous Given 01/22/23 2020)    Mobility walks     Focused Assessments Musculoskeletal    R Recommendations: See Admitting Provider Note  Report given to:   Additional Notes:

## 2023-01-22 NOTE — ED Notes (Signed)
Pt's right hand bandaged with kerlex due to slight bleeding skin tears. Bleeding controlled.

## 2023-01-22 NOTE — H&P (Addendum)
History and Physical    John [name] ZOX:[id number] DOB: [birth year] DOA: 01/22/2023  PCP: Merri Brunette, MD  Patient coming from: home  I have personally briefly reviewed patient's old medical records in [facility]  Chief Complaint: blood in stools x 3 episode  with associated syncopal event   HPI: John [name] is a 87 y.o. male with medical history significant of  CAD s/p CABG, mitral valve repair,atrial fibrillation on warfarin , PAD, HLD, hypothyrodism,hypertension, vitamin D deficiency , Anxiety , GERD, PE 2007 on chronic AC with warfarin who presents to  ED with  3-4 episodes of bloody stools as well as episode of hematemesis associated with epigastric pain.  Patient course further complicated by syncopal event at home after attempting to get of commode. Patient currently notes no sob, no further bloody stools / or emesis since admission to ED. He does however still noted epigastric pain. He denies fever/chills/ or dysuria.  ED Course:  Vitals: 97.4, bp 153/55 rr 16, HR77 sat 100%  Labs: Wbc 13.7, hgb 11.5 ( 12.3), plt288 Inr 1.8,  Na 138, K 5.5, CL 109 , bicarb 21, Glu 132, cr 1.82 ( 1.4) CE14 Cxr:NAD Lactic 1.9 EKG: NSR CTH: NAD CTAB IMPRESSION: 1. No acute findings or explanation for the patient's symptoms. 2. Mild distal colonic diverticulosis without evidence of acute inflammation. Small hiatal hernia. 3. Status post prostatectomy without evidence of local recurrence or metastatic disease. Fecal occult + Tx protonix, zofran, fentanyl, LR 1L, morphine  Review of Systems: As per HPI otherwise 10 point review of systems negative.   Past Medical History:  Diagnosis Date   Anxiety    Atrial fibrillation (HCC)    CAD (coronary artery disease)    s/p CABG   Carotid stenosis    mild by doppler   Complication of anesthesia    zenkers  diverticulum   Deep vein thrombophlebitis of leg (HCC)    DJD (degenerative joint disease)    Elevated TSH    secondary to  Amio-normal free T4.    Essential hypertension, benign    GERD (gastroesophageal reflux disease)    Hemorrhoids    Hiatal hernia    Hyperlipidemia    PAF (paroxysmal atrial fibrillation) (HCC)    Amiodarone   PE (pulmonary embolism)    pre 2007. Chronic anticoagulation   Prostate cancer (HCC)    Sebaceous cyst    neck   Seborrheic dermatitis    Tubular adenoma of colon 05/2011    Past Surgical History:  Procedure Laterality Date   bilateral shoulder surgery     CORONARY ARTERY BYPASS GRAFT     x2    2009   I & D EXTREMITY  07/09/2011   Procedure: IRRIGATION AND DEBRIDEMENT EXTREMITY;  Surgeon: Tami Ribas;  Location: [facility];  Service: Orthopedics;  Laterality: Left;   left knee arthroscopy     MITRAL VALVE REPAIR     prostate cancer surgery  11/1996   radial retropubic prostatectomy   TANDEM AND OVOID INSERTION     VASECTOMY     ZENKER'S DIVERTICULECTOMY N/A 11/07/2012   Procedure: ZENKER'S DIVERTICULECTOMY ENDOSCOPIC;  Surgeon: Christia Reading, MD;  Location: [facility];  Service: ENT;  Laterality: N/A;     reports that he quit smoking about 54 years ago. His smoking use included cigarettes. He started smoking about 66 years ago. He has a 12 pack-year smoking history. He has never used smokeless tobacco. He reports current alcohol use. He  reports that he does not use drugs.  Allergies  Allergen Reactions   Other Other (See Comments)   Iodine Hives   Iohexol Hives     PT ALLERGIC TO IV CONTRAST;(REACTION DURING CARDIAC CATHETERIZATION); NEEDS PREMEDS PER DR CRENSHAW!, Onset Date: [id number]    Statins Other (See Comments)    Joint aches with Zocor, Lipitor, Crestor    Family History  Problem Relation Age of Onset   Heart attack Father    Stroke Mother    Hypertension Mother    Hypertension Brother    Heart disease Brother    Prostate cancer Brother     Prior to Admission medications   Medication Sig Start Date End Date Taking? Authorizing Provider   amiodarone (PACERONE) 200 MG tablet TAKE 1 TABLET BY MOUTH  DAILY 03/02/22   Lewayne Bunting, MD  amLODipine (NORVASC) 5 MG tablet TAKE 1 TABLET BY MOUTH DAILY 04/29/22   Lewayne Bunting, MD  doxazosin (CARDURA) 8 MG tablet TAKE 1 TABLET BY MOUTH AT  BEDTIME 04/29/22   Lewayne Bunting, MD  ezetimibe (ZETIA) 10 MG tablet TAKE 1 TABLET BY MOUTH  DAILY 11/16/18   Michele Mcalpine, MD  furosemide (LASIX) 20 MG tablet Take 20 mg by mouth daily as needed.    [provider]  levothyroxine (SYNTHROID) 25 MCG tablet TAKE 1 TABLET BY MOUTH DAILY  BEFORE BREAKFAST 08/12/22   Lewayne Bunting, MD  losartan (COZAAR) 100 MG tablet TAKE 1 TABLET BY MOUTH DAILY 07/23/22   Lewayne Bunting, MD  Vitamin D, Ergocalciferol, (DRISDOL) 50000 units CAPS capsule Take 1 capsule (50,000 Units total) by mouth every 7 (seven) days. 09/14/16   Michele Mcalpine, MD  warfarin (COUMADIN) 1 MG tablet TAKE 1 AND 1/2 TO 2 TABLETS BY  MOUTH DAILY OR AS PRESCRIBED BY  COUMADIN CLINIC 09/28/22   Lewayne Bunting, MD    Physical Exam: Vitals:   01/22/23 1543 01/22/23 1815 01/22/23 1900 01/22/23 1936  BP:  (!) 152/54 (!) 147/43 (!) 145/49  Pulse:  73 77 75  Resp:   18 16  Temp: (!) 97.4 F (36.3 C)   98.1 F (36.7 C)  TempSrc: Oral   Oral  SpO2:  100% 100% 100%  Weight:      Height:        Constitutional: NAD, calm, comfortable Vitals:   01/22/23 1543 01/22/23 1815 01/22/23 1900 01/22/23 1936  BP:  (!) 152/54 (!) 147/43 (!) 145/49  Pulse:  73 77 75  Resp:   18 16  Temp: (!) 97.4 F (36.3 C)   98.1 F (36.7 C)  TempSrc: Oral   Oral  SpO2:  100% 100% 100%  Weight:      Height:       Eyes: PERRL, lids and conjunctivae normal ENMT: Mucous membranes are moist. Posterior pharynx clear of any exudate or lesions.Normal dentition.  Neck: normal, supple, no masses, no thyromegaly Respiratory: clear to auscultation bilaterally, no wheezing, no crackles. Normal respiratory effort. No accessory muscle use.   Cardiovascular: Regular rate and rhythm, no murmurs / rubs / gallops. No extremity edema. 2+ pedal pulses.  Abdomen: epigastric tenderness, no masses palpated. No hepatosplenomegaly.  Hypoactive Bowel sounds positive.  Musculoskeletal: no clubbing / cyanosis. No joint deformity upper and lower extremities. Good ROM, no contractures. Normal muscle tone.  Skin: no rashes, lesions, ulcers. No induration Neurologic: CN 2-12 grossly intact. Sensation intact,. Strength 5/5 in all 4.  Psychiatric: Normal judgment and insight.  Alert and oriented x 3. Normal mood.    Labs on Admission: I have personally reviewed following labs and imaging studies  CBC: Recent Labs  Lab 01/22/23 1535 01/22/23 1549  WBC 13.7*  --   NEUTROABS 12.1*  --   HGB 11.5* 11.9*  HCT 36.2* 35.0*  MCV 87.7  --   PLT 288  --    Basic Metabolic Panel: Recent Labs  Lab 01/22/23 1535 01/22/23 1549  NA 138 140  K 5.5* 5.6*  CL 109 110  CO2 21*  --   GLUCOSE 132* 122*  BUN 53* 47*  CREATININE 1.82* 1.80*  CALCIUM 8.6*  --    GFR: Estimated Creatinine Clearance: 30.5 mL/min (A) (by C-G formula based on SCr of 1.8 mg/dL (H)). Liver Function Tests: Recent Labs  Lab 01/22/23 1535  AST 28  ALT 28  ALKPHOS 87  BILITOT 0.4  PROT 6.2*  ALBUMIN 3.5   No results for input(s): "LIPASE", "AMYLASE" in the last 168 hours. No results for input(s): "AMMONIA" in the last 168 hours. Coagulation Profile: Recent Labs  Lab 01/22/23 1535  INR 1.8*   Cardiac Enzymes: No results for input(s): "CKTOTAL", "CKMB", "CKMBINDEX", "TROPONINI" in the last 168 hours. BNP (last 3 results) No results for input(s): "PROBNP" in the last 8760 hours. HbA1C: No results for input(s): "HGBA1C" in the last 72 hours. CBG: No results for input(s): "GLUCAP" in the last 168 hours. Lipid Profile: No results for input(s): "CHOL", "HDL", "LDLCALC", "TRIG", "CHOLHDL", "LDLDIRECT" in the last 72 hours. Thyroid Function Tests: No results for  input(s): "TSH", "T4TOTAL", "FREET4", "T3FREE", "THYROIDAB" in the last 72 hours. Anemia Panel: No results for input(s): "VITAMINB12", "FOLATE", "FERRITIN", "TIBC", "IRON", "RETICCTPCT" in the last 72 hours. Urine analysis:    Component Value Date/Time   COLORURINE YELLOW 07/22/2007 1036   APPEARANCEUR CLEAR 07/22/2007 1036   LABSPEC 1.022 07/22/2007 1036   PHURINE 5.0 07/22/2007 1036   GLUCOSEU NEGATIVE 07/22/2007 1036   HGBUR NEGATIVE 07/22/2007 1036   BILIRUBINUR NEGATIVE 07/22/2007 1036   KETONESUR NEGATIVE 07/22/2007 1036   PROTEINUR NEGATIVE 07/22/2007 1036   UROBILINOGEN 0.2 07/22/2007 1036   NITRITE NEGATIVE 07/22/2007 1036   LEUKOCYTESUR  07/22/2007 1036    NEGATIVE MICROSCOPIC NOT DONE ON URINES WITH NEGATIVE PROTEIN, BLOOD, LEUKOCYTES, NITRITE, OR GLUCOSE <1000 mg/dL.    Radiological Exams on Admission: CT ABDOMEN PELVIS WO CONTRAST  Result Date: 01/22/2023 CLINICAL DATA:  Abdominal pain, acute, nonlocalized. Rectal bleeding. Evaluate for colitis. EXAM: CT ABDOMEN AND PELVIS WITHOUT CONTRAST TECHNIQUE: Multidetector CT imaging of the abdomen and pelvis was performed following the standard protocol without IV contrast. RADIATION DOSE REDUCTION: This exam was performed according to the departmental dose-optimization program which includes automated exposure control, adjustment of the mA and/or kV according to patient size and/or use of iterative reconstruction technique. COMPARISON:  None Available. FINDINGS: Lower chest: Mild scarring at both lung bases. Atherosclerosis of the aorta and coronary arteries status post median sternotomy and probable mitral valve replacement. The heart size is normal. There is a small hiatal hernia. Hepatobiliary: The liver appears unremarkable as imaged in the noncontrast state. No evidence of gallstones, gallbladder wall thickening or biliary dilatation. Pancreas: Unremarkable. No pancreatic ductal dilatation or surrounding inflammatory changes.  Spleen: Normal in size without focal abnormality. Adrenals/Urinary Tract: Both adrenal glands appear normal. No evidence of urinary tract calculus, suspicious renal lesion or hydronephrosis. The bladder appears unremarkable for its degree of distention. Stomach/Bowel: No enteric contrast administered. The stomach appears unremarkable for  its degree of distension. No evidence of bowel wall thickening, distention or surrounding inflammatory change. The appendix appears normal. Mild descending and sigmoid colon diverticulosis without evidence of acute inflammation. Vascular/Lymphatic: There are no enlarged abdominal or pelvic lymph nodes. Aortic and branch vessel atherosclerosis without evidence of aneurysm. Postsurgical changes in both inguinal regions. Reproductive: Status post prostatectomy. No evidence of pelvic mass. Other: No evidence of abdominal wall mass or hernia. No ascites or pneumoperitoneum. Musculoskeletal: No acute or significant osseous findings. Multilevel thoracolumbar spondylosis and ankylosis. The left sacroiliac joint is ankylosed. There are mild degenerative changes at both hips. Healed median sternotomy. Moderate bilateral gynecomastia. Unless specific follow-up recommendations are mentioned in the findings or impression sections, no imaging follow-up of any mentioned incidental findings is recommended. IMPRESSION: 1. No acute findings or explanation for the patient's symptoms. 2. Mild distal colonic diverticulosis without evidence of acute inflammation. Small hiatal hernia. 3. Status post prostatectomy without evidence of local recurrence or metastatic disease. 4.  Aortic Atherosclerosis (ICD10-I70.0). Electronically Signed   By: Carey Bullocks M.D.   On: 01/22/2023 18:43   CT Head Wo Contrast  Result Date: 01/22/2023 CLINICAL DATA:  Head trauma, minor (Age >= 65y); Neck trauma (Age >= 65y). Fall. EXAM: CT HEAD WITHOUT CONTRAST CT CERVICAL SPINE WITHOUT CONTRAST TECHNIQUE: Multidetector CT  imaging of the head and cervical spine was performed following the standard protocol without intravenous contrast. Multiplanar CT image reconstructions of the cervical spine were also generated. RADIATION DOSE REDUCTION: This exam was performed according to the departmental dose-optimization program which includes automated exposure control, adjustment of the mA and/or kV according to patient size and/or use of iterative reconstruction technique. COMPARISON:  None Available. FINDINGS: CT HEAD FINDINGS Brain: There is no evidence of an acute infarct, intracranial hemorrhage, mass, midline shift, or extra-axial fluid collection. The ventricles and sulci are within normal limits for age. Hypodensities in the cerebral white matter are nonspecific but compatible with very mild chronic small vessel ischemic disease. Vascular: Calcified atherosclerosis at the skull base. No hyperdense vessel. Skull: No acute fracture or suspicious osseous lesion. Sinuses/Orbits: Paranasal sinuses and mastoid air cells are clear. Bilateral cataract extraction. Other: None. CT CERVICAL SPINE FINDINGS Alignment: Cervical spine straightening.  No listhesis. Skull base and vertebrae: No acute fracture or suspicious osseous lesion. Soft tissues and spinal canal: No prevertebral fluid or swelling. No visible canal hematoma. Disc levels: Moderate diffuse cervical disc degeneration and severe facet arthrosis. Interbody and left facet ankylosis at C4-5. Moderate to severe multilevel neural foraminal stenosis. Mild-to-moderate spinal stenosis at C3-4 and C4-5. Upper chest: Clear lung apices. Other: Moderate calcific atherosclerosis at the carotid bifurcations. IMPRESSION: 1. No evidence of acute intracranial abnormality. 2. No acute cervical spine fracture. 3. Advanced cervical disc and facet degeneration. Electronically Signed   By: Sebastian Ache M.D.   On: 01/22/2023 18:39   CT Cervical Spine Wo Contrast  Result Date: 01/22/2023 CLINICAL DATA:   Head trauma, minor (Age >= 65y); Neck trauma (Age >= 65y). Fall. EXAM: CT HEAD WITHOUT CONTRAST CT CERVICAL SPINE WITHOUT CONTRAST TECHNIQUE: Multidetector CT imaging of the head and cervical spine was performed following the standard protocol without intravenous contrast. Multiplanar CT image reconstructions of the cervical spine were also generated. RADIATION DOSE REDUCTION: This exam was performed according to the departmental dose-optimization program which includes automated exposure control, adjustment of the mA and/or kV according to patient size and/or use of iterative reconstruction technique. COMPARISON:  None Available. FINDINGS: CT HEAD FINDINGS Brain: There is  no evidence of an acute infarct, intracranial hemorrhage, mass, midline shift, or extra-axial fluid collection. The ventricles and sulci are within normal limits for age. Hypodensities in the cerebral white matter are nonspecific but compatible with very mild chronic small vessel ischemic disease. Vascular: Calcified atherosclerosis at the skull base. No hyperdense vessel. Skull: No acute fracture or suspicious osseous lesion. Sinuses/Orbits: Paranasal sinuses and mastoid air cells are clear. Bilateral cataract extraction. Other: None. CT CERVICAL SPINE FINDINGS Alignment: Cervical spine straightening.  No listhesis. Skull base and vertebrae: No acute fracture or suspicious osseous lesion. Soft tissues and spinal canal: No prevertebral fluid or swelling. No visible canal hematoma. Disc levels: Moderate diffuse cervical disc degeneration and severe facet arthrosis. Interbody and left facet ankylosis at C4-5. Moderate to severe multilevel neural foraminal stenosis. Mild-to-moderate spinal stenosis at C3-4 and C4-5. Upper chest: Clear lung apices. Other: Moderate calcific atherosclerosis at the carotid bifurcations. IMPRESSION: 1. No evidence of acute intracranial abnormality. 2. No acute cervical spine fracture. 3. Advanced cervical disc and facet  degeneration. Electronically Signed   By: Sebastian Ache M.D.   On: 01/22/2023 18:39   DG Shoulder Right  Result Date: 01/22/2023 CLINICAL DATA:  Fall.  Syncope. EXAM: RIGHT SHOULDER - 2+ VIEW COMPARISON:  None Available. FINDINGS: The no signs of acute fracture or dislocation. Moderate to severe degenerative changes are identified at the acromioclavicular joint. Soft tissues are unremarkable. IMPRESSION: 1. No acute findings. 2. AC joint osteoarthritis. Electronically Signed   By: Signa Kell M.D.   On: 01/22/2023 16:06   DG Wrist Complete Right  Result Date: 01/22/2023 CLINICAL DATA:  Pain, fall. EXAM: RIGHT WRIST - COMPLETE 3 VIEW COMPARISON:  None Available. FINDINGS: There is no evidence of fracture or dislocation. Severe radiocarpal joint space narrowing and sclerosis consistent with osteoarthritis. IMPRESSION: Degenerative changes.  No acute osseous abnormalities. Electronically Signed   By: Layla Maw M.D.   On: 01/22/2023 16:04   DG Chest Portable 1 View  Result Date: 01/22/2023 CLINICAL DATA:  Chest pain, syncope. EXAM: PORTABLE CHEST 1 VIEW COMPARISON:  Nov 27, 2021. FINDINGS: The heart size and mediastinal contours are within normal limits. Status post coronary artery bypass graft. Both lungs are clear. The visualized skeletal structures are unremarkable. IMPRESSION: No active disease. Electronically Signed   By: Lupita Raider M.D.   On: 01/22/2023 15:43    EKG: Independently reviewed. See above  Assessment/Plan  Acute upper GI bleed presumed due to PUD - admit to progressive care  -continue on protonix drip  -serial h/h , transfuse if <8 -npo  -supportive care ivfs , antiemetics ,pain medications  -f/u with [facility] [place]  for full recs in am   Leukocytosis -presumed stress response -no sign of infection  -monitor counts  CAD s/p CABG - resume zetia  - hold cozaar/lasix due to mild aki   Atrial fibrillation  -currently in sinus  -resume amiodarone  - hold  warfarin in setting of GI bleed  - GI to weigh in on when appropriate to resume  Hx of PE  2009 -rec at that time was chronic AC  - will hold warfarin in setting of gi bleed  -will need to re-evaluate need for life long AC -gi to weigh in on when appropriate to resume    HLD -continue zetia as able   Hypothyroidism  - resume synthroid as able    DVT prophylaxis: SCD Code Status: full/ as discussed per patient wishes in event of cardiac arrest  Family  Communication:  Disposition Plan: patient  expected to be admitted greater than 2 midnights  Consults called: [facility]  Admission status: progressive care   Lurline Del MD [facility]   If 7PM-7AM, please contact night-coverage [email or web address] Password [facility]  01/22/2023, 8:15 PM

## 2023-01-22 NOTE — ED Notes (Signed)
ED TO INPATIENT HANDOFF REPORT  ED Nurse Name and Phone #: Delfin Edis - [phone]  S Name/Age/Gender John [name] 87 y.o. male Room/Bed: 038C/038C  Code Status   Code Status: Full Code  Home/SNF/Other Home Patient oriented to: self, place, time, and situation Is this baseline? Yes   Triage Complete: Triage complete  Chief Complaint GI bleed [K92.2]  Triage Note No notes on file   Allergies Allergies  Allergen Reactions   Other Other (See Comments)   Iodine Hives   Iohexol Hives     PT ALLERGIC TO IV CONTRAST;(REACTION DURING CARDIAC CATHETERIZATION); NEEDS PREMEDS PER DR CRENSHAW!, Onset Date: [id number]    Statins Other (See Comments)    Joint aches with Zocor, Lipitor, Crestor    Level of Care/Admitting Diagnosis ED Disposition     ED Disposition  Admit   Condition  --   Comment  Hospital Area: [facility] [[id number]]  Level of Care: Progressive [102]  Admit to Progressive based on following criteria: GI, ENDOCRINE disease patients with GI bleeding, acute liver failure or pancreatitis, stable with diabetic ketoacidosis or thyrotoxicosis (hypothyroid) state.  May admit patient to [facility] or [facility] if equivalent level of care is available:: No  Covid Evaluation: Asymptomatic - no recent exposure (last 10 days) testing not required  Diagnosis: GI bleed [914782]  Admitting Physician: Lurline Del [[id number]]  Attending Physician: Lurline Del [[id number]]  Certification:: I certify this patient will need inpatient services for at least 2 midnights  Estimated Length of Stay: 3          B Medical/Surgery History Past Medical History:  Diagnosis Date   Anxiety    Atrial fibrillation (HCC)    CAD (coronary artery disease)    s/p CABG   Carotid stenosis    mild by doppler   Complication of anesthesia    zenkers  diverticulum   Deep vein thrombophlebitis of leg (HCC)    DJD (degenerative joint disease)    Elevated  TSH    secondary to Amio-normal free T4.    Essential hypertension, benign    GERD (gastroesophageal reflux disease)    Hemorrhoids    Hiatal hernia    Hyperlipidemia    PAF (paroxysmal atrial fibrillation) (HCC)    Amiodarone   PE (pulmonary embolism)    pre 2007. Chronic anticoagulation   Prostate cancer (HCC)    Sebaceous cyst    neck   Seborrheic dermatitis    Tubular adenoma of colon 05/2011   Past Surgical History:  Procedure Laterality Date   bilateral shoulder surgery     CORONARY ARTERY BYPASS GRAFT     x2    2009   I & D EXTREMITY  07/09/2011   Procedure: IRRIGATION AND DEBRIDEMENT EXTREMITY;  Surgeon: Tami Ribas;  Location: [facility];  Service: Orthopedics;  Laterality: Left;   left knee arthroscopy     MITRAL VALVE REPAIR     prostate cancer surgery  11/1996   radial retropubic prostatectomy   TANDEM AND OVOID INSERTION     VASECTOMY     ZENKER'S DIVERTICULECTOMY N/A 11/07/2012   Procedure: ZENKER'S DIVERTICULECTOMY ENDOSCOPIC;  Surgeon: Christia Reading, MD;  Location: [facility] OR;  Service: ENT;  Laterality: N/A;     A IV Location/Drains/Wounds Patient Lines/Drains/Airways Status     Active Line/Drains/Airways     Name Placement date Placement time Site Days   Peripheral IV 01/22/23 18 G Anterior;Distal;Left;Upper Arm 01/22/23  --  Arm  less than 1            Intake/Output Last 24 hours  Intake/Output Summary (Last 24 hours) at 01/22/2023 2311 Last data filed at 01/22/2023 1941 Gross per 24 hour  Intake 1000 ml  Output --  Net 1000 ml    Labs/Imaging Results for orders placed or performed during the hospital encounter of 01/22/23 (from the past 48 hour(s))  CBC with Differential     Status: Abnormal   Collection Time: 01/22/23  3:35 PM  Result Value Ref Range   WBC 13.7 (H) 4.0 - 10.5 K/uL   RBC 4.13 (L) 4.22 - 5.81 MIL/uL   Hemoglobin 11.5 (L) 13.0 - 17.0 g/dL   HCT 16.1 (L) 09.6 - 04.5 %   MCV 87.7 80.0 - 100.0 fL   MCH 27.8 26.0  - 34.0 pg   MCHC 31.8 30.0 - 36.0 g/dL   RDW 40.9 81.1 - 91.4 %   Platelets 288 150 - 400 K/uL   nRBC 0.0 0.0 - 0.2 %   Neutrophils Relative % 89 %   Neutro Abs 12.1 (H) 1.7 - 7.7 K/uL   Lymphocytes Relative 6 %   Lymphs Abs 0.8 0.7 - 4.0 K/uL   Monocytes Relative 4 %   Monocytes Absolute 0.6 0.1 - 1.0 K/uL   Eosinophils Relative 0 %   Eosinophils Absolute 0.0 0.0 - 0.5 K/uL   Basophils Relative 0 %   Basophils Absolute 0.0 0.0 - 0.1 K/uL   Immature Granulocytes 1 %   Abs Immature Granulocytes 0.11 (H) 0.00 - 0.07 K/uL    Comment: Performed at [facility], [address], [place], [place] [address]  Comprehensive metabolic panel     Status: Abnormal   Collection Time: 01/22/23  3:35 PM  Result Value Ref Range   Sodium 138 135 - 145 mmol/L   Potassium 5.5 (H) 3.5 - 5.1 mmol/L   Chloride 109 98 - 111 mmol/L   CO2 21 (L) 22 - 32 mmol/L   Glucose, Bld 132 (H) 70 - 99 mg/dL    Comment: Glucose reference range applies only to samples taken after fasting for at least 8 hours.   BUN 53 (H) 8 - 23 mg/dL   Creatinine, Ser 6.21 (H) 0.61 - 1.24 mg/dL   Calcium 8.6 (L) 8.9 - 10.3 mg/dL   Total Protein 6.2 (L) 6.5 - 8.1 g/dL   Albumin 3.5 3.5 - 5.0 g/dL   AST 28 15 - 41 U/L   ALT 28 0 - 44 U/L   Alkaline Phosphatase 87 38 - 126 U/L   Total Bilirubin 0.4 0.3 - 1.2 mg/dL   GFR, Estimated 36 (L) >60 mL/min    Comment: (NOTE) Calculated using the CKD-EPI Creatinine Equation (2021)    Anion gap 8 5 - 15    Comment: Performed at [facility], [address] [place], [place] [address]  Type and screen [facility]     Status: None   Collection Time: 01/22/23  3:35 PM  Result Value Ref Range   ABO/RH(D) O POS    Antibody Screen NEG    Sample Expiration      01/25/2023,2359 Performed at [facility], [address] [place], [place] [address]   Protime-INR     Status: Abnormal   Collection Time: 01/22/23  3:35 PM  Result Value Ref Range   Prothrombin  Time 21.1 (H) 11.4 - 15.2 seconds   INR  1.8 (H) 0.8 - 1.2    Comment: (NOTE) INR goal varies based on device and disease states. Performed at [facility], [address], [place], [place] [address]   Troponin I (High Sensitivity)     Status: None   Collection Time: 01/22/23  3:35 PM  Result Value Ref Range   Troponin I (High Sensitivity) 14 <18 ng/L    Comment: (NOTE) Elevated high sensitivity troponin I (hsTnI) values and significant  changes across serial measurements may suggest ACS but many other  chronic and acute conditions are known to elevate hsTnI results.  Refer to the "Links" section for chest pain algorithms and additional  guidance. Performed at [facility], [address], [place], [place] [address]   I-stat chem 8, ED (not at [facility] or [facility])     Status: Abnormal   Collection Time: 01/22/23  3:49 PM  Result Value Ref Range   Sodium 140 135 - 145 mmol/L   Potassium 5.6 (H) 3.5 - 5.1 mmol/L   Chloride 110 98 - 111 mmol/L   BUN 47 (H) 8 - 23 mg/dL   Creatinine, Ser 4.69 (H) 0.61 - 1.24 mg/dL   Glucose, Bld 629 (H) 70 - 99 mg/dL    Comment: Glucose reference range applies only to samples taken after fasting for at least 8 hours.   Calcium, Ion 1.16 1.15 - 1.40 mmol/L   TCO2 19 (L) 22 - 32 mmol/L   Hemoglobin 11.9 (L) 13.0 - 17.0 g/dL   HCT 52.8 (L) 41.3 - 24.4 %  Troponin I (High Sensitivity)     Status: None   Collection Time: 01/22/23  5:52 PM  Result Value Ref Range   Troponin I (High Sensitivity) 17 <18 ng/L    Comment: (NOTE) Elevated high sensitivity troponin I (hsTnI) values and significant  changes across serial measurements may suggest ACS but many other  chronic and acute conditions are known to elevate hsTnI results.  Refer to the "Links" section for chest pain algorithms and additional  guidance. Performed at [facility], [address], [place], [place] [address]   POC occult blood, ED Provider will collect     Status:  Abnormal   Collection Time: 01/22/23  6:12 PM  Result Value Ref Range   Fecal Occult Bld POSITIVE (A) NEGATIVE  Lactic acid, plasma     Status: None   Collection Time: 01/22/23  7:34 PM  Result Value Ref Range   Lactic Acid, Venous 1.9 0.5 - 1.9 mmol/L    Comment: Performed at [facility], [address] [place], [place] [address]  Lactic acid, plasma     Status: None   Collection Time: 01/22/23 10:07 PM  Result Value Ref Range   Lactic Acid, Venous 1.2 0.5 - 1.9 mmol/L    Comment: Performed at [facility], [address] [place], [place] [address]  TSH     Status: None   Collection Time: 01/22/23 10:07 PM  Result Value Ref Range   TSH 3.665 0.350 - 4.500 uIU/mL    Comment: Performed by a 3rd Generation assay with a functional sensitivity of <=0.01 uIU/mL. Performed at [facility], [address], [place], [place] [address]    CT ABDOMEN PELVIS WO CONTRAST  Result Date: 01/22/2023 CLINICAL DATA:  Abdominal pain, acute, nonlocalized. Rectal bleeding. Evaluate for colitis. EXAM: CT ABDOMEN AND PELVIS WITHOUT CONTRAST TECHNIQUE: Multidetector CT imaging of the abdomen and pelvis was  performed following the standard protocol without IV contrast. RADIATION DOSE REDUCTION: This exam was performed according to the departmental dose-optimization program which includes automated exposure control, adjustment of the mA and/or kV according to patient size and/or use of iterative reconstruction technique. COMPARISON:  None Available. FINDINGS: Lower chest: Mild scarring at both lung bases. Atherosclerosis of the aorta and coronary arteries status post median sternotomy and probable mitral valve replacement. The heart size is normal. There is a small hiatal hernia. Hepatobiliary: The liver appears unremarkable as imaged in the noncontrast state. No evidence of gallstones, gallbladder wall thickening or biliary dilatation. Pancreas: Unremarkable. No pancreatic ductal dilatation or  surrounding inflammatory changes. Spleen: Normal in size without focal abnormality. Adrenals/Urinary Tract: Both adrenal glands appear normal. No evidence of urinary tract calculus, suspicious renal lesion or hydronephrosis. The bladder appears unremarkable for its degree of distention. Stomach/Bowel: No enteric contrast administered. The stomach appears unremarkable for its degree of distension. No evidence of bowel wall thickening, distention or surrounding inflammatory change. The appendix appears normal. Mild descending and sigmoid colon diverticulosis without evidence of acute inflammation. Vascular/Lymphatic: There are no enlarged abdominal or pelvic lymph nodes. Aortic and branch vessel atherosclerosis without evidence of aneurysm. Postsurgical changes in both inguinal regions. Reproductive: Status post prostatectomy. No evidence of pelvic mass. Other: No evidence of abdominal wall mass or hernia. No ascites or pneumoperitoneum. Musculoskeletal: No acute or significant osseous findings. Multilevel thoracolumbar spondylosis and ankylosis. The left sacroiliac joint is ankylosed. There are mild degenerative changes at both hips. Healed median sternotomy. Moderate bilateral gynecomastia. Unless specific follow-up recommendations are mentioned in the findings or impression sections, no imaging follow-up of any mentioned incidental findings is recommended. IMPRESSION: 1. No acute findings or explanation for the patient's symptoms. 2. Mild distal colonic diverticulosis without evidence of acute inflammation. Small hiatal hernia. 3. Status post prostatectomy without evidence of local recurrence or metastatic disease. 4.  Aortic Atherosclerosis (ICD10-I70.0). Electronically Signed   By: Carey Bullocks M.D.   On: 01/22/2023 18:43   CT Head Wo Contrast  Result Date: 01/22/2023 CLINICAL DATA:  Head trauma, minor (Age >= 65y); Neck trauma (Age >= 65y). Fall. EXAM: CT HEAD WITHOUT CONTRAST CT CERVICAL SPINE WITHOUT  CONTRAST TECHNIQUE: Multidetector CT imaging of the head and cervical spine was performed following the standard protocol without intravenous contrast. Multiplanar CT image reconstructions of the cervical spine were also generated. RADIATION DOSE REDUCTION: This exam was performed according to the departmental dose-optimization program which includes automated exposure control, adjustment of the mA and/or kV according to patient size and/or use of iterative reconstruction technique. COMPARISON:  None Available. FINDINGS: CT HEAD FINDINGS Brain: There is no evidence of an acute infarct, intracranial hemorrhage, mass, midline shift, or extra-axial fluid collection. The ventricles and sulci are within normal limits for age. Hypodensities in the cerebral white matter are nonspecific but compatible with very mild chronic small vessel ischemic disease. Vascular: Calcified atherosclerosis at the skull base. No hyperdense vessel. Skull: No acute fracture or suspicious osseous lesion. Sinuses/Orbits: Paranasal sinuses and mastoid air cells are clear. Bilateral cataract extraction. Other: None. CT CERVICAL SPINE FINDINGS Alignment: Cervical spine straightening.  No listhesis. Skull base and vertebrae: No acute fracture or suspicious osseous lesion. Soft tissues and spinal canal: No prevertebral fluid or swelling. No visible canal hematoma. Disc levels: Moderate diffuse cervical disc degeneration and severe facet arthrosis. Interbody and left facet ankylosis at C4-5. Moderate to severe multilevel neural foraminal stenosis. Mild-to-moderate spinal stenosis at C3-4 and C4-5. Upper chest:  Clear lung apices. Other: Moderate calcific atherosclerosis at the carotid bifurcations. IMPRESSION: 1. No evidence of acute intracranial abnormality. 2. No acute cervical spine fracture. 3. Advanced cervical disc and facet degeneration. Electronically Signed   By: Sebastian Ache M.D.   On: 01/22/2023 18:39   CT Cervical Spine Wo  Contrast  Result Date: 01/22/2023 CLINICAL DATA:  Head trauma, minor (Age >= 65y); Neck trauma (Age >= 65y). Fall. EXAM: CT HEAD WITHOUT CONTRAST CT CERVICAL SPINE WITHOUT CONTRAST TECHNIQUE: Multidetector CT imaging of the head and cervical spine was performed following the standard protocol without intravenous contrast. Multiplanar CT image reconstructions of the cervical spine were also generated. RADIATION DOSE REDUCTION: This exam was performed according to the departmental dose-optimization program which includes automated exposure control, adjustment of the mA and/or kV according to patient size and/or use of iterative reconstruction technique. COMPARISON:  None Available. FINDINGS: CT HEAD FINDINGS Brain: There is no evidence of an acute infarct, intracranial hemorrhage, mass, midline shift, or extra-axial fluid collection. The ventricles and sulci are within normal limits for age. Hypodensities in the cerebral white matter are nonspecific but compatible with very mild chronic small vessel ischemic disease. Vascular: Calcified atherosclerosis at the skull base. No hyperdense vessel. Skull: No acute fracture or suspicious osseous lesion. Sinuses/Orbits: Paranasal sinuses and mastoid air cells are clear. Bilateral cataract extraction. Other: None. CT CERVICAL SPINE FINDINGS Alignment: Cervical spine straightening.  No listhesis. Skull base and vertebrae: No acute fracture or suspicious osseous lesion. Soft tissues and spinal canal: No prevertebral fluid or swelling. No visible canal hematoma. Disc levels: Moderate diffuse cervical disc degeneration and severe facet arthrosis. Interbody and left facet ankylosis at C4-5. Moderate to severe multilevel neural foraminal stenosis. Mild-to-moderate spinal stenosis at C3-4 and C4-5. Upper chest: Clear lung apices. Other: Moderate calcific atherosclerosis at the carotid bifurcations. IMPRESSION: 1. No evidence of acute intracranial abnormality. 2. No acute cervical  spine fracture. 3. Advanced cervical disc and facet degeneration. Electronically Signed   By: Sebastian Ache M.D.   On: 01/22/2023 18:39   DG Shoulder Right  Result Date: 01/22/2023 CLINICAL DATA:  Fall.  Syncope. EXAM: RIGHT SHOULDER - 2+ VIEW COMPARISON:  None Available. FINDINGS: The no signs of acute fracture or dislocation. Moderate to severe degenerative changes are identified at the acromioclavicular joint. Soft tissues are unremarkable. IMPRESSION: 1. No acute findings. 2. AC joint osteoarthritis. Electronically Signed   By: Signa Kell M.D.   On: 01/22/2023 16:06   DG Wrist Complete Right  Result Date: 01/22/2023 CLINICAL DATA:  Pain, fall. EXAM: RIGHT WRIST - COMPLETE 3 VIEW COMPARISON:  None Available. FINDINGS: There is no evidence of fracture or dislocation. Severe radiocarpal joint space narrowing and sclerosis consistent with osteoarthritis. IMPRESSION: Degenerative changes.  No acute osseous abnormalities. Electronically Signed   By: Layla Maw M.D.   On: 01/22/2023 16:04   DG Chest Portable 1 View  Result Date: 01/22/2023 CLINICAL DATA:  Chest pain, syncope. EXAM: PORTABLE CHEST 1 VIEW COMPARISON:  Nov 27, 2021. FINDINGS: The heart size and mediastinal contours are within normal limits. Status post coronary artery bypass graft. Both lungs are clear. The visualized skeletal structures are unremarkable. IMPRESSION: No active disease. Electronically Signed   By: Lupita Raider M.D.   On: 01/22/2023 15:43    Pending Labs Unresulted Labs (From admission, onward)     Start     Ordered   01/23/23 0500  Comprehensive metabolic panel  Tomorrow morning,   R  01/22/23 2042   01/22/23 2041  Hemoglobin and hematocrit, blood  Now then every 6 hours,   R      01/22/23 2042            Vitals/Pain Today's Vitals   01/22/23 1936 01/22/23 2045 01/22/23 2050 01/22/23 2245  BP: (!) 145/49 (!) 152/53  (!) 162/51  Pulse: 75 75  81  Resp: 16 18  20   Temp: 98.1 F (36.7 C)  98 F (36.7 C)    TempSrc: Oral Oral    SpO2: 100% 98%  98%  Weight:      Height:      PainSc:   6      Isolation Precautions No active isolations  Medications Medications  amiodarone (PACERONE) tablet 200 mg (has no administration in time range)  amLODipine (NORVASC) tablet 5 mg (has no administration in time range)  ezetimibe (ZETIA) tablet 10 mg (10 mg Oral Given 01/22/23 2211)  indapamide (LOZOL) tablet 0.625 mg (has no administration in time range)  levothyroxine (SYNTHROID) tablet 25 mcg (has no administration in time range)  cholecalciferol (VITAMIN D3) 25 MCG (1000 UNIT) tablet 5,000 Units (has no administration in time range)  0.45 % sodium chloride infusion ( Intravenous New Bag/Given 01/22/23 2210)  acetaminophen (TYLENOL) tablet 650 mg (has no administration in time range)    Or  acetaminophen (TYLENOL) suppository 650 mg (has no administration in time range)  fentaNYL (SUBLIMAZE) injection 12.5 mcg (has no administration in time range)  ondansetron (ZOFRAN) tablet 4 mg (has no administration in time range)    Or  ondansetron (ZOFRAN) injection 4 mg (has no administration in time range)  albuterol (PROVENTIL) (2.5 MG/3ML) 0.083% nebulizer solution 2.5 mg (has no administration in time range)  fentaNYL (SUBLIMAZE) injection 50 mcg (50 mcg Intravenous Given 01/22/23 1612)  ondansetron (ZOFRAN) injection 4 mg (4 mg Intravenous Given 01/22/23 1612)  pantoprazole (PROTONIX) 80 mg /NS 100 mL IVPB (0 mg Intravenous Stopped 01/22/23 1704)  lactated ringers bolus 1,000 mL (0 mLs Intravenous Stopped 01/22/23 1941)  morphine (PF) 4 MG/ML injection 4 mg (4 mg Intravenous Given 01/22/23 2020)  ondansetron (ZOFRAN) injection 4 mg (4 mg Intravenous Given 01/22/23 2020)    Mobility walks     Focused Assessments Neuro Assessment Handoff:  Swallow screen pass? Yes  Cardiac Rhythm: Normal sinus rhythm       Neuro Assessment: Within Defined Limits Neuro Checks:      Has TPA been  given? No If patient is a Neuro Trauma and patient is going to OR before floor call report to 4N Charge nurse: [phone] or [phone]   R Recommendations: See Admitting Provider Note  Report given to:   Additional Notes:

## 2023-01-22 NOTE — ED Notes (Signed)
Patient reports he had a syncopal episode at home earlier today. Reports was walking out of bathroom and passed out. States does not recall how it happened. Pt noted A&OX4, reporting weakness. States had blood coming out of during time of LOC. Reporting 6/10 epigastric pain. No hematemesis while in ED per previous RN. Respirations regular and unlabored. Made comfortable in bed. Remains on the monitor. Placed non skid socks on patient. Call bell at bedside. No further needs at this time.

## 2023-01-22 NOTE — ED Provider Notes (Signed)
[place] EMERGENCY DEPARTMENT AT [facility] Provider Note  MDM   HPI/ROS:  John [name] is a 87 y.o. male with a medical history as below who presents after a syncopal fall. Reports generalized weakness, n/v/d with hematemesis and melena starting today and associated with epigastric pain. Syncopal fall with LOC today. Does take coumadin at home for afib and prior CABG. No recent fever / chills / infectious symptoms.    Physical exam is notable for: -Mild epigastric abdominal tenderness  -1+ Bilateral pedal edema -Lungs CTAB  On my initial evaluation, patient is:  -Vital signs stable. Patient afebrile, hemodynamically stable, and uncomfortable but non-toxic appearing.  -Additional history obtained from daughter at bedside  This patient's current presentation, including their history and physical exam, is most consistent with GIB w/ concern for active bleed. Differentials include gastritis, PUD, cardiogenic syncope. Patient appears pale and uncomfortable, but has only mild ttp to epigastric region. Will plan for broad workup including work up for syncopal fall on thinners, and GIB. Dispo likely admission, pending work up and serial reevaluation.    Physical exam is concerning for frankly melanic stool, and tenderness to palpation.  I did consider the need for a CTA GI bleed protocol to evaluate for any ongoing active extra of that might be amenable to IR, however the patient has a contrast allergy and appears hemodynamically stable at this time and risk of allergic reaction outweighs benefit of imaging at this time.  Will discuss with GI for possible upper versus lower endoscopy.   Interpretations, interventions, and the patient's course of care are documented below.    Clinical Course as of 01/22/23 2141  Fri Jan 22, 2023  1700 Creatinine(!): 1.82 AKI in the setting of a diarrheal illness, will give a liter bolus [BB]  1845 Dr.Gessner, with lower GI was made aware the patient.  [BB]  2139 Fecal Occult Blood, POC(!): POSITIVE [BB]    Clinical Course User Index [BB] Fayrene Helper, MD     Disposition:  I discussed the case with hospitalist who graciously agreed to admit the patient to their service for continued care.   Clinical Impression: No diagnosis found.  Rx / DC Orders ED Discharge Orders     None       The plan for this patient was discussed with Dr. Dalene Seltzer, who voiced agreement and who oversaw evaluation and treatment of this patient.   Clinical Complexity A medically appropriate history, review of systems, and physical exam was performed.  My independent interpretations of EKG, labs, and radiology are documented in the ED course above.   If decision rules were used in this patient's evaluation, they are listed below.   Click here for ABCD2, HEART and other calculatorsREFRESH Note before signing   Patient's presentation is most consistent with acute presentation with potential threat to life or bodily function.  Medical Decision Making Amount and/or Complexity of Data Reviewed Independent Historian: caregiver    Details: Daughter at bedside External Data Reviewed: notes.    Details: Outpatient visit in Oct 2023 Labs: ordered. Decision-making details documented in ED Course. Radiology: ordered and independent interpretation performed.  Risk OTC drugs. Prescription drug management. Parenteral controlled substances. Drug therapy requiring intensive monitoring for toxicity. Decision regarding hospitalization.    HPI/ROS      See MDM section for pertinent HPI and ROS. A complete ROS was performed with pertinent positives/negatives noted above.   Past Medical History:  Diagnosis Date   Anxiety    Atrial fibrillation (  HCC)    CAD (coronary artery disease)    s/p CABG   Carotid stenosis    mild by doppler   Complication of anesthesia    zenkers  diverticulum   Deep vein thrombophlebitis of leg (HCC)    DJD  (degenerative joint disease)    Elevated TSH    secondary to Amio-normal free T4.    Essential hypertension, benign    GERD (gastroesophageal reflux disease)    Hemorrhoids    Hiatal hernia    Hyperlipidemia    PAF (paroxysmal atrial fibrillation) (HCC)    Amiodarone   PE (pulmonary embolism)    pre 2007. Chronic anticoagulation   Prostate cancer (HCC)    Sebaceous cyst    neck   Seborrheic dermatitis    Tubular adenoma of colon 05/2011    Past Surgical History:  Procedure Laterality Date   bilateral shoulder surgery     CORONARY ARTERY BYPASS GRAFT     x2    2009   I & D EXTREMITY  07/09/2011   Procedure: IRRIGATION AND DEBRIDEMENT EXTREMITY;  Surgeon: Tami Ribas;  Location: [facility];  Service: Orthopedics;  Laterality: Left;   left knee arthroscopy     MITRAL VALVE REPAIR     prostate cancer surgery  11/1996   radial retropubic prostatectomy   TANDEM AND OVOID INSERTION     VASECTOMY     ZENKER'S DIVERTICULECTOMY N/A 11/07/2012   Procedure: ZENKER'S DIVERTICULECTOMY ENDOSCOPIC;  Surgeon: Christia Reading, MD;  Location: [facility] OR;  Service: ENT;  Laterality: N/A;      Physical Exam   Vitals:   01/22/23 1502 01/22/23 1503 01/22/23 1504  SpO2: 99% 99%   Weight:   73 kg  Height:   5\' 9"  (1.753 m)    Physical Exam Vitals and nursing note reviewed.  Constitutional:      General: He is not in acute distress.    Appearance: He is not ill-appearing or toxic-appearing.     Comments: Appears uncomfortable  HENT:     Head: Normocephalic and atraumatic.  Eyes:     General: No scleral icterus.    Extraocular Movements: Extraocular movements intact.     Pupils: Pupils are equal, round, and reactive to light.     Comments: Pale conjunctivae   Pulmonary:     Effort: Pulmonary effort is normal.     Breath sounds: Normal breath sounds.  Abdominal:     General: Abdomen is flat. Bowel sounds are normal.     Palpations: Abdomen is soft. There is no mass or pulsatile  mass.     Tenderness: There is abdominal tenderness in the epigastric area and periumbilical area.  Skin:    General: Skin is warm and dry.     Coloration: Skin is pale.  Neurological:     Mental Status: He is alert and oriented to person, place, and time.      Procedures   If procedures were preformed on this patient, they are listed below:  Procedures   Fayrene Helper, MD Emergency Medicine PGY-2   Please note that this documentation was produced with the assistance of voice-to-text technology and may contain errors.    Fayrene Helper, MD 01/22/23 5284    Alvira Monday, MD 01/25/23 2137

## 2023-01-23 DIAGNOSIS — R112 Nausea with vomiting, unspecified: Secondary | ICD-10-CM | POA: Diagnosis not present

## 2023-01-23 DIAGNOSIS — I480 Paroxysmal atrial fibrillation: Secondary | ICD-10-CM | POA: Diagnosis not present

## 2023-01-23 DIAGNOSIS — N179 Acute kidney failure, unspecified: Secondary | ICD-10-CM | POA: Diagnosis not present

## 2023-01-23 DIAGNOSIS — K921 Melena: Secondary | ICD-10-CM | POA: Diagnosis not present

## 2023-01-23 DIAGNOSIS — K920 Hematemesis: Secondary | ICD-10-CM | POA: Diagnosis not present

## 2023-01-23 DIAGNOSIS — I2510 Atherosclerotic heart disease of native coronary artery without angina pectoris: Secondary | ICD-10-CM | POA: Diagnosis not present

## 2023-01-23 LAB — COMPREHENSIVE METABOLIC PANEL
ALT: 22 U/L (ref 0–44)
AST: 24 U/L (ref 15–41)
Albumin: 3 g/dL — ABNORMAL LOW (ref 3.5–5.0)
Alkaline Phosphatase: 67 U/L (ref 38–126)
Anion gap: 7 (ref 5–15)
BUN: 51 mg/dL — ABNORMAL HIGH (ref 8–23)
CO2: 22 mmol/L (ref 22–32)
Calcium: 8.2 mg/dL — ABNORMAL LOW (ref 8.9–10.3)
Chloride: 108 mmol/L (ref 98–111)
Creatinine, Ser: 1.53 mg/dL — ABNORMAL HIGH (ref 0.61–1.24)
GFR, Estimated: 45 mL/min — ABNORMAL LOW (ref >60)
Glucose, Bld: 97 mg/dL (ref 70–99)
Potassium: 4.6 mmol/L (ref 3.5–5.1)
Sodium: 137 mmol/L (ref 135–145)
Total Bilirubin: 0.4 mg/dL (ref 0.3–1.2)
Total Protein: 5.3 g/dL — ABNORMAL LOW (ref 6.5–8.1)

## 2023-01-23 LAB — HEMOGLOBIN AND HEMATOCRIT, BLOOD
HCT: 29.5 % — ABNORMAL LOW (ref 39.0–52.0)
HCT: 28.6 % — ABNORMAL LOW (ref 39.0–52.0)
HCT: 28.7 % — ABNORMAL LOW (ref 39.0–52.0)
HCT: 27.9 % — ABNORMAL LOW (ref 39.0–52.0)
HCT: 27.6 % — ABNORMAL LOW (ref 39.0–52.0)
Hemoglobin: 9.6 g/dL — ABNORMAL LOW (ref 13.0–17.0)
Hemoglobin: 9.2 g/dL — ABNORMAL LOW (ref 13.0–17.0)
Hemoglobin: 9.3 g/dL — ABNORMAL LOW (ref 13.0–17.0)
Hemoglobin: 8.8 g/dL — ABNORMAL LOW (ref 13.0–17.0)
Hemoglobin: 8.9 g/dL — ABNORMAL LOW (ref 13.0–17.0)

## 2023-01-23 MED ORDER — PANTOPRAZOLE SODIUM 40 MG IV SOLR: 40.0000 mg | Freq: Two times a day (BID) | INTRAVENOUS | Status: DC

## 2023-01-23 MED ORDER — PANTOPRAZOLE INFUSION (NEW) - SIMPLE MED
8.0000 mg/h | INTRAVENOUS | Status: AC
  Administered 2023-01-23 – 2023-01-25 (×4): 8 mg/h via INTRAVENOUS
  Filled 2023-01-23 (×8): qty 100

## 2023-01-23 MED ORDER — PANTOPRAZOLE 80MG IVPB - SIMPLE MED
80.0000 mg | Freq: Once | INTRAVENOUS | Status: AC
  Administered 2023-01-23: 80 mg via INTRAVENOUS
  Filled 2023-01-23: qty 100

## 2023-01-23 NOTE — Consult Note (Addendum)
Referring Provider: Dr. Jodean Lima  Primary Care Physician:  Merri Brunette, MD Primary Gastroenterologist:  Dr. Russella Dar   Reason for Consultation: N/V, hematemesis and melena  HPI: John [name] is a 87 y.o. male with a past medical history of anxiety, hypertension, hyperlipidemia, atrial fibrillation, coronary artery disease s/p CABG 2009, s/p MP repair, carotid stenosis, PE 2007, prostate cancer s/p prostatectomy, colon polyps and Zenker's diverticulum s/p diverticulectomy.  He presented to the ED 01/22/2023 following a syncopal episode with generalized weakness, hematemesis and melena.  He also endorsed having epigastric pain mild shortness of breath.  No chest pain.  On Coumadin, last dose taken at 8:30pm on 7/18. Labs in the ED showed a WBC count of 13.7.  Hemoglobin 11.5 ( Hg 12.07 June 2021).  Hematocrit 36.2.  Platelet 288.  Potassium 5.5.  BUN 53 (BUN 02 May 2022). Creatinine 1.82.  Albumin 3.5.  Total bili 0.4.  Alk phos 87.  AST 28.  ALT 28.  INR 1.8.  Troponin 14 -> 17.  TSH 3.665.  FOBT positive.  CT without acute intracranial abnormality.  CTAP without contrast showed a normal stomach ago without evidence of a perforation and no bowel wall thickening. Mild diverticulosis to the descending and sigmoid colon was noted. He received Protonix 80 mg IV x 1 bolus. GI consult was requested for further evaluation for GI bleed.  Labs today showed a hemoglobin level dropped to 9.6.  Hematocrit 29.5.  Repeat hemoglobin 9.2 -> 9.3.  BUN 51.  Creatinine 1.53.  Protonix re-bolus with continuous IV infusion ordered by the hospitalist at this time.   No further hematemesis or melena since admission.  He described feeling sweaty on his way to the bathroom yesterday, stomach felt funny.  He passed 1 loose black stool an hour later he went back to the bathroom and had a syncopal episode brief loss of consciousness.  Upon awakening he saw a lot of blood on his shirt and he passed a large amount of black  stool.  He pushed his medical alert button and his neighbor and EMS were alerted.  His last dose of Coumadin was taken Thursday evening around 8:30 PM.  No NSAID use.  No alcohol use.  No GERD symptoms or dysphagia.  He has a remote history of dysphagia and underwent an EGD in 2018 which showed a small hiatal hernia and the esophagus was empirically dilated.  He typically passes a normal brown bowel movement daily.  History of colon polyps, last colonoscopy 11/2016 identified 4 polyps removed from the colon.   GI PROCEDURES:  EGD 11/17/2016 done due to dysphagia: - Z-line variable, 38 cm from the incisors. Biopsied.  - No endoscopic esophageal abnormality to explain patient's dysphagia. Esophagus dilated. Dilated.  - Small hiatal hernia.  - Normal duodenal bulb and second portion of the duodenum.  Colonoscopy 11/17/2016: - Four 6 to 7 mm polyps in the descending colon and in the transverse colon, removed with a cold snare. Resected and retrieved. - Internal hemorrhoids. - The examination was otherwise normal on direct and retroflexion views.  1. Surgical [P], transverse - 2, and descending - 2, polyp (4) - TUBULAR ADENOMA (6 FRAGMENTS). - NO HIGH GRADE DYSPLASIA OR MALIGNANCY. 2. Surgical [P], distal esophagus (irregular Z line) - GASTROESOPHAGEAL JUNCTION MUCOSA WITH INFLAMMATORY CHANGES CONSISTENT WITH REFLUX. - NO INTESTINAL METAPLASIA, DYSPLASIA OR MALIGNANCY  EGD 04/25/2012: Gastritis Small hiatal hernia  Colonoscopy 05/20/2011: 4 polyps removed from the colon  Past Medical History:  Diagnosis  Date   Anxiety    Atrial fibrillation (HCC)    CAD (coronary artery disease)    s/p CABG   Carotid stenosis    mild by doppler   Complication of anesthesia    zenkers  diverticulum   Deep vein thrombophlebitis of leg (HCC)    DJD (degenerative joint disease)    Elevated TSH    secondary to Amio-normal free T4.    Essential hypertension, benign    GERD (gastroesophageal reflux disease)     Hemorrhoids    Hiatal hernia    Hyperlipidemia    PAF (paroxysmal atrial fibrillation) (HCC)    Amiodarone   PE (pulmonary embolism)    pre 2007. Chronic anticoagulation   Prostate cancer (HCC)    Sebaceous cyst    neck   Seborrheic dermatitis    Tubular adenoma of colon 05/2011    Past Surgical History:  Procedure Laterality Date   bilateral shoulder surgery     CORONARY ARTERY BYPASS GRAFT     x2    2009   I & D EXTREMITY  07/09/2011   Procedure: IRRIGATION AND DEBRIDEMENT EXTREMITY;  Surgeon: Tami Ribas;  Location: [facility];  Service: Orthopedics;  Laterality: Left;   left knee arthroscopy     MITRAL VALVE REPAIR     prostate cancer surgery  11/1996   radial retropubic prostatectomy   TANDEM AND OVOID INSERTION     VASECTOMY     ZENKER'S DIVERTICULECTOMY N/A 11/07/2012   Procedure: ZENKER'S DIVERTICULECTOMY ENDOSCOPIC;  Surgeon: Christia Reading, MD;  Location: [facility] OR;  Service: ENT;  Laterality: N/A;    Prior to Admission medications   Medication Sig Start Date End Date Taking? Authorizing Provider  amiodarone (PACERONE) 200 MG tablet TAKE 1 TABLET BY MOUTH  DAILY Patient taking differently: Take 200 mg by mouth daily. 03/02/22  Yes Lewayne Bunting, MD  amLODipine (NORVASC) 5 MG tablet TAKE 1 TABLET BY MOUTH DAILY Patient taking differently: Take 5 mg by mouth daily. 04/29/22  Yes Lewayne Bunting, MD  Cholecalciferol (VITAMIN D-3 PO) Take 1 tablet by mouth daily.   Yes [provider]  ezetimibe (ZETIA) 10 MG tablet TAKE 1 TABLET BY MOUTH  DAILY Patient taking differently: Take 10 mg by mouth daily. 11/16/18  Yes Michele Mcalpine, MD  furosemide (LASIX) 20 MG tablet Take 20 mg by mouth daily as needed for fluid.   Yes [provider]  indapamide (LOZOL) 1.25 MG tablet Take 0.625 mg by mouth daily. 12/17/22  Yes [provider]  levothyroxine (SYNTHROID) 25 MCG tablet TAKE 1 TABLET BY MOUTH DAILY  BEFORE BREAKFAST 08/12/22  Yes  Crenshaw, [name], MD  losartan (COZAAR) 100 MG tablet TAKE 1 TABLET BY MOUTH DAILY Patient taking differently: Take 100 mg by mouth daily. 07/23/22  Yes Lewayne Bunting, MD  warfarin (COUMADIN) 1 MG tablet TAKE 1 AND 1/2 TO 2 TABLETS BY  MOUTH DAILY OR AS PRESCRIBED BY  COUMADIN CLINIC Patient taking differently: Take 1.5-2 mg by mouth See admin instructions. Take 1 and 1/2 tablets every day except on Thurs and Saturdays take two tablets per patient 09/28/22  Yes Lewayne Bunting, MD  doxazosin (CARDURA) 8 MG tablet TAKE 1 TABLET BY MOUTH AT  BEDTIME Patient not taking: Reported on 01/22/2023 04/29/22   Lewayne Bunting, MD  Vitamin D, Ergocalciferol, (DRISDOL) 50000 units CAPS capsule Take 1 capsule (50,000 Units total) by mouth every 7 (seven) days. Patient not taking:  Reported on 01/22/2023 09/14/16   Michele Mcalpine, MD    Current Facility-Administered Medications  Medication Dose Route Frequency Provider Last Rate Last Admin   0.45 % sodium chloride infusion   Intravenous Continuous Lurline Del, MD 75 mL/hr at 01/23/23 1023 New Bag at 01/23/23 1023   acetaminophen (TYLENOL) tablet 650 mg  650 mg Oral Q6H PRN Lurline Del, MD   650 mg at 01/23/23 0981   Or   acetaminophen (TYLENOL) suppository 650 mg  650 mg Rectal Q6H PRN Lurline Del, MD       albuterol (PROVENTIL) (2.5 MG/3ML) 0.083% nebulizer solution 2.5 mg  2.5 mg Nebulization Q2H PRN Lurline Del, MD       amiodarone (PACERONE) tablet 200 mg  200 mg Oral Daily Skip Mayer A, MD   200 mg at 01/23/23 1914   amLODipine (NORVASC) tablet 5 mg  5 mg Oral Daily Lurline Del, MD   5 mg at 01/23/23 7829   cholecalciferol (VITAMIN D3) 25 MCG (1000 UNIT) tablet 5,000 Units  5,000 Units Oral Daily Lurline Del, MD   5,000 Units at 01/23/23 5621   ezetimibe (ZETIA) tablet 10 mg  10 mg Oral Daily Skip Mayer A, MD   10 mg at 01/22/23 2211   fentaNYL (SUBLIMAZE) injection 12.5 mcg  12.5 mcg  Intravenous Q3H PRN Lurline Del, MD   12.5 mcg at 01/23/23 0407   indapamide (LOZOL) tablet 0.625 mg  0.625 mg Oral Daily Lurline Del, MD       levothyroxine (SYNTHROID) tablet 25 mcg  25 mcg Oral Q0600 Lurline Del, MD   25 mcg at 01/23/23 3086   ondansetron [facility]) tablet 4 mg  4 mg Oral Q6H PRN Lurline Del, MD       Or   ondansetron [facility]) injection 4 mg  4 mg Intravenous Q6H PRN Lurline Del, MD        Allergies as of 01/22/2023 - Review Complete 01/22/2023  Allergen Reaction Noted   Other Other (See Comments) 08/08/2019   Iodine Hives 12/11/2008   Iohexol Hives 07/14/2007   Statins Other (See Comments)     Family History  Problem Relation Age of Onset   Heart attack Father    Stroke Mother    Hypertension Mother    Hypertension Brother    Heart disease Brother    Prostate cancer Brother     Social History   Socioeconomic History   Marital status: Married    Spouse name: margaret   Number of children: 3   Years of education: Not on file   Highest education level: Not on file  Occupational History   Occupation: retired    Associate Professor: RETIRED  Tobacco Use   Smoking status: Former    Current packs/day: 0.00    Average packs/day: 1 pack/day for 12.0 years (12.0 ttl pk-yrs)    Types: Cigarettes    Start date: 07/06/1956    Quit date: 07/06/1968    Years since quitting: 54.5   Smokeless tobacco: Never  Substance and Sexual Activity   Alcohol use: Yes    Comment: social use   Drug use: No   Sexual activity: Not on file  Other Topics Concern   Not on file  Social History Narrative   Not on file   Social Determinants of Health   Financial Resource Strain: Not on file  Food Insecurity: No Food Insecurity (01/23/2023)   Hunger Vital Sign  Worried About Programme researcher, broadcasting/film/video in the Last Year: Never true    Ran Out of Food in the Last Year: Never true  Transportation Needs: No Transportation Needs (01/23/2023)   PRAPARE -  Administrator, Civil Service (Medical): No    Lack of Transportation (Non-Medical): No  Physical Activity: Not on file  Stress: Not on file  Social Connections: Not on file  Intimate Partner Violence: Not At Risk (01/23/2023)   Humiliation, Afraid, Rape, and Kick questionnaire    Fear of Current or Ex-Partner: No    Emotionally Abused: No    Physically Abused: No    Sexually Abused: No   Review of Systems: Gen: Denies fever, sweats or chills. No weight loss.  CV: Denies chest pain, palpitations or edema. Resp:+ SOB yesterday. GI: See HPI.   GU : Denies urinary burning, blood in urine, increased urinary frequency or incontinence. MS: Denies joint pain, muscles aches or weakness. Derm: Denies rash, itchiness, skin lesions or unhealing ulcers. Psych: Denies depression, anxiety, memory loss or confusion. Heme: + Easy bruising.  Neuro:  Denies headaches, dizziness or paresthesias. Endo: + Hypothyroidism.  Physical Exam: Vital signs in last 24 hours: Temp:  [97.4 F (36.3 C)-98.3 F (36.8 C)] 98.3 F (36.8 C) (07/20 1125) Pulse Rate:  [67-81] 76 (07/20 1125) Resp:  [11-23] 19 (07/20 1125) BP: (130-162)/(43-59) 141/51 (07/20 1125) SpO2:  [95 %-100 %] 98 % (07/20 1125) Weight:  [73 kg] 73 kg (07/19 1504) Last BM Date : 01/22/23 General: Alert 87 year old male fatigued-appearing in no acute distress. Head:  Normocephalic and atraumatic. Eyes:  No scleral icterus. Conjunctiva pink. Ears:  Normal auditory acuity. Nose:  No deformity, discharge or lesions. Mouth:  Dentition intact. No ulcers or lesions.  Neck:  Supple. No lymphadenopathy or thyromegaly.  Lungs: Breath sounds clear throughout. No wheezes, rhonchi or crackles.  Heart: Rate and rhythm, no murmurs. Abdomen:, Nondistended.  Nontender.  Positive bowel sounds to all 4 quadrants. Rectal: Deferred. Musculoskeletal:  Symmetrical without gross deformities.  Pulses:  Normal pulses noted. Extremities:  Right  forearm with Kerlix dressing intact.  No edema. Neurologic:  Alert and  oriented x 4. No focal deficits.  Skin:  Intact without significant lesions or rashes. Psych:  Alert and cooperative. Normal mood and affect.  Intake/Output from previous day: 07/19 0701 - 07/20 0700 In: 1000 [IV Piggyback:1000] Out: 125 [Urine:125] Intake/Output this shift: Total I/O In: 0  Out: 400 [Urine:400]  Lab Results: Recent Labs    01/22/23 1535 01/22/23 1549 01/23/23 0458 01/23/23 0648 01/23/23 0815  WBC 13.7*  --   --   --   --   HGB 11.5*   < > 9.6* 9.2* 9.3*  HCT 36.2*   < > 29.5* 28.6* 28.7*  PLT 288  --   --   --   --    < > = values in this interval not displayed.   BMET Recent Labs    01/22/23 1535 01/22/23 1549 01/23/23 0815  NA 138 140 137  K 5.5* 5.6* 4.6  CL 109 110 108  CO2 21*  --  22  GLUCOSE 132* 122* 97  BUN 53* 47* 51*  CREATININE 1.82* 1.80* 1.53*  CALCIUM 8.6*  --  8.2*   LFT Recent Labs    01/23/23 0815  PROT 5.3*  ALBUMIN 3.0*  AST 24  ALT 22  ALKPHOS 67  BILITOT 0.4   PT/INR Recent Labs    01/22/23 1535  LABPROT  21.1*  INR 1.8*   Hepatitis Panel No results for input(s): "HEPBSAG", "HCVAB", "HEPAIGM", "HEPBIGM" in the last 72 hours.    Studies/Results: CT ABDOMEN PELVIS WO CONTRAST  Result Date: 01/22/2023 CLINICAL DATA:  Abdominal pain, acute, nonlocalized. Rectal bleeding. Evaluate for colitis. EXAM: CT ABDOMEN AND PELVIS WITHOUT CONTRAST TECHNIQUE: Multidetector CT imaging of the abdomen and pelvis was performed following the standard protocol without IV contrast. RADIATION DOSE REDUCTION: This exam was performed according to the departmental dose-optimization program which includes automated exposure control, adjustment of the mA and/or kV according to patient size and/or use of iterative reconstruction technique. COMPARISON:  None Available. FINDINGS: Lower chest: Mild scarring at both lung bases. Atherosclerosis of the aorta and coronary  arteries status post median sternotomy and probable mitral valve replacement. The heart size is normal. There is a small hiatal hernia. Hepatobiliary: The liver appears unremarkable as imaged in the noncontrast state. No evidence of gallstones, gallbladder wall thickening or biliary dilatation. Pancreas: Unremarkable. No pancreatic ductal dilatation or surrounding inflammatory changes. Spleen: Normal in size without focal abnormality. Adrenals/Urinary Tract: Both adrenal glands appear normal. No evidence of urinary tract calculus, suspicious renal lesion or hydronephrosis. The bladder appears unremarkable for its degree of distention. Stomach/Bowel: No enteric contrast administered. The stomach appears unremarkable for its degree of distension. No evidence of bowel wall thickening, distention or surrounding inflammatory change. The appendix appears normal. Mild descending and sigmoid colon diverticulosis without evidence of acute inflammation. Vascular/Lymphatic: There are no enlarged abdominal or pelvic lymph nodes. Aortic and branch vessel atherosclerosis without evidence of aneurysm. Postsurgical changes in both inguinal regions. Reproductive: Status post prostatectomy. No evidence of pelvic mass. Other: No evidence of abdominal wall mass or hernia. No ascites or pneumoperitoneum. Musculoskeletal: No acute or significant osseous findings. Multilevel thoracolumbar spondylosis and ankylosis. The left sacroiliac joint is ankylosed. There are mild degenerative changes at both hips. Healed median sternotomy. Moderate bilateral gynecomastia. Unless specific follow-up recommendations are mentioned in the findings or impression sections, no imaging follow-up of any mentioned incidental findings is recommended. IMPRESSION: 1. No acute findings or explanation for the patient's symptoms. 2. Mild distal colonic diverticulosis without evidence of acute inflammation. Small hiatal hernia. 3. Status post prostatectomy without  evidence of local recurrence or metastatic disease. 4.  Aortic Atherosclerosis (ICD10-I70.0). Electronically Signed   By: Carey Bullocks M.D.   On: 01/22/2023 18:43   CT Head Wo Contrast  Result Date: 01/22/2023 CLINICAL DATA:  Head trauma, minor (Age >= 65y); Neck trauma (Age >= 65y). Fall. EXAM: CT HEAD WITHOUT CONTRAST CT CERVICAL SPINE WITHOUT CONTRAST TECHNIQUE: Multidetector CT imaging of the head and cervical spine was performed following the standard protocol without intravenous contrast. Multiplanar CT image reconstructions of the cervical spine were also generated. RADIATION DOSE REDUCTION: This exam was performed according to the departmental dose-optimization program which includes automated exposure control, adjustment of the mA and/or kV according to patient size and/or use of iterative reconstruction technique. COMPARISON:  None Available. FINDINGS: CT HEAD FINDINGS Brain: There is no evidence of an acute infarct, intracranial hemorrhage, mass, midline shift, or extra-axial fluid collection. The ventricles and sulci are within normal limits for age. Hypodensities in the cerebral white matter are nonspecific but compatible with very mild chronic small vessel ischemic disease. Vascular: Calcified atherosclerosis at the skull base. No hyperdense vessel. Skull: No acute fracture or suspicious osseous lesion. Sinuses/Orbits: Paranasal sinuses and mastoid air cells are clear. Bilateral cataract extraction. Other: None. CT CERVICAL SPINE FINDINGS Alignment: Cervical spine  straightening.  No listhesis. Skull base and vertebrae: No acute fracture or suspicious osseous lesion. Soft tissues and spinal canal: No prevertebral fluid or swelling. No visible canal hematoma. Disc levels: Moderate diffuse cervical disc degeneration and severe facet arthrosis. Interbody and left facet ankylosis at C4-5. Moderate to severe multilevel neural foraminal stenosis. Mild-to-moderate spinal stenosis at C3-4 and C4-5. Upper  chest: Clear lung apices. Other: Moderate calcific atherosclerosis at the carotid bifurcations. IMPRESSION: 1. No evidence of acute intracranial abnormality. 2. No acute cervical spine fracture. 3. Advanced cervical disc and facet degeneration. Electronically Signed   By: Sebastian Ache M.D.   On: 01/22/2023 18:39   CT Cervical Spine Wo Contrast  Result Date: 01/22/2023 CLINICAL DATA:  Head trauma, minor (Age >= 65y); Neck trauma (Age >= 65y). Fall. EXAM: CT HEAD WITHOUT CONTRAST CT CERVICAL SPINE WITHOUT CONTRAST TECHNIQUE: Multidetector CT imaging of the head and cervical spine was performed following the standard protocol without intravenous contrast. Multiplanar CT image reconstructions of the cervical spine were also generated. RADIATION DOSE REDUCTION: This exam was performed according to the departmental dose-optimization program which includes automated exposure control, adjustment of the mA and/or kV according to patient size and/or use of iterative reconstruction technique. COMPARISON:  None Available. FINDINGS: CT HEAD FINDINGS Brain: There is no evidence of an acute infarct, intracranial hemorrhage, mass, midline shift, or extra-axial fluid collection. The ventricles and sulci are within normal limits for age. Hypodensities in the cerebral white matter are nonspecific but compatible with very mild chronic small vessel ischemic disease. Vascular: Calcified atherosclerosis at the skull base. No hyperdense vessel. Skull: No acute fracture or suspicious osseous lesion. Sinuses/Orbits: Paranasal sinuses and mastoid air cells are clear. Bilateral cataract extraction. Other: None. CT CERVICAL SPINE FINDINGS Alignment: Cervical spine straightening.  No listhesis. Skull base and vertebrae: No acute fracture or suspicious osseous lesion. Soft tissues and spinal canal: No prevertebral fluid or swelling. No visible canal hematoma. Disc levels: Moderate diffuse cervical disc degeneration and severe facet arthrosis.  Interbody and left facet ankylosis at C4-5. Moderate to severe multilevel neural foraminal stenosis. Mild-to-moderate spinal stenosis at C3-4 and C4-5. Upper chest: Clear lung apices. Other: Moderate calcific atherosclerosis at the carotid bifurcations. IMPRESSION: 1. No evidence of acute intracranial abnormality. 2. No acute cervical spine fracture. 3. Advanced cervical disc and facet degeneration. Electronically Signed   By: Sebastian Ache M.D.   On: 01/22/2023 18:39   DG Shoulder Right  Result Date: 01/22/2023 CLINICAL DATA:  Fall.  Syncope. EXAM: RIGHT SHOULDER - 2+ VIEW COMPARISON:  None Available. FINDINGS: The no signs of acute fracture or dislocation. Moderate to severe degenerative changes are identified at the acromioclavicular joint. Soft tissues are unremarkable. IMPRESSION: 1. No acute findings. 2. AC joint osteoarthritis. Electronically Signed   By: Signa Kell M.D.   On: 01/22/2023 16:06   DG Wrist Complete Right  Result Date: 01/22/2023 CLINICAL DATA:  Pain, fall. EXAM: RIGHT WRIST - COMPLETE 3 VIEW COMPARISON:  None Available. FINDINGS: There is no evidence of fracture or dislocation. Severe radiocarpal joint space narrowing and sclerosis consistent with osteoarthritis. IMPRESSION: Degenerative changes.  No acute osseous abnormalities. Electronically Signed   By: Layla Maw M.D.   On: 01/22/2023 16:04   DG Chest Portable 1 View  Result Date: 01/22/2023 CLINICAL DATA:  Chest pain, syncope. EXAM: PORTABLE CHEST 1 VIEW COMPARISON:  Nov 27, 2021. FINDINGS: The heart size and mediastinal contours are within normal limits. Status post coronary artery bypass graft. Both lungs are clear.  The visualized skeletal structures are unremarkable. IMPRESSION: No active disease. Electronically Signed   By: Lupita Raider M.D.   On: 01/22/2023 15:43    IMPRESSION/PLAN:  87 year old male admitted to the hospital 01/22/2023 following episode of syncope with hematemesis and melena.  Admission  hemoglobin 11.5 -> 9.3.  Last dose of Coumadin was 7/18 on 8:30pm.  Hemodynamically stable. -Clear liquid diet -NPO for possible EGD tomorrow if anesthesia available -EGD benefits and risks discussed including risk with sedation, risk of bleeding, perforation and infection  -Continue to hold Coumadin -Check H&H every 6 hours x 24 hours -Transfuse for hemoglobin level less than 8 -Continue PPI infusion -Ondansetron 4 mg p.o. or IV every 6 hours as needed -Await further recommendations per Dr. Lavon Paganini  History of coronary artery disease s/p CABG, s/p MP repair   History of atrial fibrillation -Continue to hold Coumadin  History of PE 2007     John [name]  01/23/2023, 2:44PM     Attending physician's note  I have taken a history, reviewed the chart and examined the patient. I performed a substantive portion of this encounter, including complete performance of at least one of the key components, in conjunction with the APP. I agree with the APP's note, impression and recommendations.   87 year old male with history of CAD s/p CABG, carotid stenosis, PE.  On chronic anticoagulation with Coumadin, last dose on 01/21/2023 INR 1.8  Presented following episode of syncope with hematemesis and melena Hemoglobin declined from baseline 11.5-9.3  Concerning for acute upper GI hemorrhage, will plan for EGD tomorrow morning He is hemodynamically stable Clear liquid diet  Monitor hemoglobin and transfuse if below 7 PPI gtt. Continue to hold Coumadin Follow-up INR in the morning N.p.o. after midnight  The patient was provided an opportunity to ask questions and all were answered. The patient agreed with the plan and demonstrated an understanding of the instructions.  Iona Beard , MD [phone]

## 2023-01-23 NOTE — ED Notes (Signed)
Bed reported not ready on the floor at this time. Charge on unit will cal back when it is clean. ED charge nurse made aware.

## 2023-01-23 NOTE — Plan of Care (Signed)

## 2023-01-23 NOTE — Hospital Course (Signed)
John [name] is a 87 y.o. male with a history of CAD s/p CABG, s/p MVR, atrial fibrillation, PE/DVT on Coumadin, PAD, hyperlipidemia, hypertension, vitamin D deficiency.  Patient presented secondary to bloody stools and syncope with concern for an active GI bleed. GI consulted.

## 2023-01-23 NOTE — Progress Notes (Signed)
Gastroenterologist at bedside. Stated plan for EGD tomorrow at 0730.

## 2023-01-23 NOTE — Progress Notes (Signed)
PROGRESS NOTE    John [name]  UJW:[id number] DOB: 1936-05-28 DOA: 01/22/2023 PCP: Merri Brunette, MD   Brief Narrative: John [name] is a 87 y.o. male with a history of CAD s/p CABG, s/p MVR, atrial fibrillation, PE/DVT on Coumadin, PAD, hyperlipidemia, hypertension, vitamin D deficiency.  Patient presented secondary to bloody stools and syncope with concern for an active GI bleed. GI consulted.   Assessment and Plan:  Acute GI bleeding Presumed secondary to GI bleeding. Patient with dark stools. FOBT positive. BUN elevated. Concern for upper GI source. -Continue Protonix drip -GI recommendations: CLD, NPO, EGD -Hold Coumadin  Acute anemia Appears to be acute and related to current acute GI bleeding. -Trend CBC/H&H  Leukocytosis Mild. Likely reactive. -CBC in AM  CAD History of CABG. Patient is on Coumadin, which is held.  Atrial fibrillation Likely paroxysmal as patient is in sinus rhythm. Patient is managed on amiodarone and Coumadin as an outpatient. -Continue amiodarone  Primary hypertension Patient is on losartan and amlodipine as an outpatient. Losartan held secondary to AKI. -Continue amlodipine  AKI on CKD stage IIIa Baseline creatinine of about 1.4-1.5. Creatinine of 1.82 on admission which has improved to baseline with IV fluids.  History of PE/DVT Patient is on Coumadin as an outpatient.  Hyperlipidemia -Continue Zetia  Hypothyroidism -Continue Synthroid  DVT prophylaxis: SCDs Code Status:   Code Status: Full Code Family Communication: Daughter at bedside Disposition Plan: Discharge pending GI recommendations/management   Consultants:  [facility]  Procedures:  None  Antimicrobials: None    Subjective: Patient reports no bowel movements since admission. He had some abdominal pain which has improved with flatus.  Objective: BP (!) 130/51 (BP Location: Left Arm)   Pulse 69   Temp 98 F (36.7 C) (Oral)   Resp 19   Ht  5\' 9"  (1.753 m)   Wt 73 kg   SpO2 98%   BMI 23.78 kg/m   Examination:  General exam: Appears calm and comfortable Respiratory system: Clear to auscultation. Respiratory effort normal. Cardiovascular system: S1 & S2 heard, RRR. No murmurs. Gastrointestinal system: Abdomen is nondistended, soft and nontender. Normal bowel sounds heard. Central nervous system: Alert and oriented. Musculoskeletal: No edema. No calf tenderness Skin: No cyanosis. No rashes Psychiatry: Judgement and insight appear normal. Mood & affect appropriate.    Data Reviewed: I have personally reviewed following labs and imaging studies  CBC Lab Results  Component Value Date   WBC 13.7 (H) 01/22/2023   RBC 4.13 (L) 01/22/2023   HGB 9.3 (L) 01/23/2023   HCT 28.7 (L) 01/23/2023   MCV 87.7 01/22/2023   MCH 27.8 01/22/2023   PLT 288 01/22/2023   MCHC 31.8 01/22/2023   RDW 15.2 01/22/2023   LYMPHSABS 0.8 01/22/2023   MONOABS 0.6 01/22/2023   EOSABS 0.0 01/22/2023   BASOSABS 0.0 01/22/2023     Last metabolic panel Lab Results  Component Value Date   NA 137 01/23/2023   K 4.6 01/23/2023   CL 108 01/23/2023   CO2 22 01/23/2023   BUN 51 (H) 01/23/2023   CREATININE 1.53 (H) 01/23/2023   GLUCOSE 97 01/23/2023   GFRNONAA 45 (L) 01/23/2023   GFRAA 52 (L) 01/09/2020   CALCIUM 8.2 (L) 01/23/2023   PROT 5.3 (L) 01/23/2023   ALBUMIN 3.0 (L) 01/23/2023   LABGLOB 2.3 04/24/2022   AGRATIO 1.9 04/24/2022   BILITOT 0.4 01/23/2023   ALKPHOS 67 01/23/2023   AST 24 01/23/2023   ALT 22 01/23/2023  ANIONGAP 7 01/23/2023    GFR: Estimated Creatinine Clearance: 35.9 mL/min (A) (by C-G formula based on SCr of 1.53 mg/dL (H)).  No results found for this or any previous visit (from the past 240 hour(s)).    Radiology Studies: CT ABDOMEN PELVIS WO CONTRAST  Result Date: 01/22/2023 CLINICAL DATA:  Abdominal pain, acute, nonlocalized. Rectal bleeding. Evaluate for colitis. EXAM: CT ABDOMEN AND PELVIS WITHOUT  CONTRAST TECHNIQUE: Multidetector CT imaging of the abdomen and pelvis was performed following the standard protocol without IV contrast. RADIATION DOSE REDUCTION: This exam was performed according to the departmental dose-optimization program which includes automated exposure control, adjustment of the mA and/or kV according to patient size and/or use of iterative reconstruction technique. COMPARISON:  None Available. FINDINGS: Lower chest: Mild scarring at both lung bases. Atherosclerosis of the aorta and coronary arteries status post median sternotomy and probable mitral valve replacement. The heart size is normal. There is a small hiatal hernia. Hepatobiliary: The liver appears unremarkable as imaged in the noncontrast state. No evidence of gallstones, gallbladder wall thickening or biliary dilatation. Pancreas: Unremarkable. No pancreatic ductal dilatation or surrounding inflammatory changes. Spleen: Normal in size without focal abnormality. Adrenals/Urinary Tract: Both adrenal glands appear normal. No evidence of urinary tract calculus, suspicious renal lesion or hydronephrosis. The bladder appears unremarkable for its degree of distention. Stomach/Bowel: No enteric contrast administered. The stomach appears unremarkable for its degree of distension. No evidence of bowel wall thickening, distention or surrounding inflammatory change. The appendix appears normal. Mild descending and sigmoid colon diverticulosis without evidence of acute inflammation. Vascular/Lymphatic: There are no enlarged abdominal or pelvic lymph nodes. Aortic and branch vessel atherosclerosis without evidence of aneurysm. Postsurgical changes in both inguinal regions. Reproductive: Status post prostatectomy. No evidence of pelvic mass. Other: No evidence of abdominal wall mass or hernia. No ascites or pneumoperitoneum. Musculoskeletal: No acute or significant osseous findings. Multilevel thoracolumbar spondylosis and ankylosis. The left  sacroiliac joint is ankylosed. There are mild degenerative changes at both hips. Healed median sternotomy. Moderate bilateral gynecomastia. Unless specific follow-up recommendations are mentioned in the findings or impression sections, no imaging follow-up of any mentioned incidental findings is recommended. IMPRESSION: 1. No acute findings or explanation for the patient's symptoms. 2. Mild distal colonic diverticulosis without evidence of acute inflammation. Small hiatal hernia. 3. Status post prostatectomy without evidence of local recurrence or metastatic disease. 4.  Aortic Atherosclerosis (ICD10-I70.0). Electronically Signed   By: Carey Bullocks M.D.   On: 01/22/2023 18:43   CT Head Wo Contrast  Result Date: 01/22/2023 CLINICAL DATA:  Head trauma, minor (Age >= 65y); Neck trauma (Age >= 65y). Fall. EXAM: CT HEAD WITHOUT CONTRAST CT CERVICAL SPINE WITHOUT CONTRAST TECHNIQUE: Multidetector CT imaging of the head and cervical spine was performed following the standard protocol without intravenous contrast. Multiplanar CT image reconstructions of the cervical spine were also generated. RADIATION DOSE REDUCTION: This exam was performed according to the departmental dose-optimization program which includes automated exposure control, adjustment of the mA and/or kV according to patient size and/or use of iterative reconstruction technique. COMPARISON:  None Available. FINDINGS: CT HEAD FINDINGS Brain: There is no evidence of an acute infarct, intracranial hemorrhage, mass, midline shift, or extra-axial fluid collection. The ventricles and sulci are within normal limits for age. Hypodensities in the cerebral white matter are nonspecific but compatible with very mild chronic small vessel ischemic disease. Vascular: Calcified atherosclerosis at the skull base. No hyperdense vessel. Skull: No acute fracture or suspicious osseous lesion. Sinuses/Orbits: Paranasal sinuses  and mastoid air cells are clear. Bilateral  cataract extraction. Other: None. CT CERVICAL SPINE FINDINGS Alignment: Cervical spine straightening.  No listhesis. Skull base and vertebrae: No acute fracture or suspicious osseous lesion. Soft tissues and spinal canal: No prevertebral fluid or swelling. No visible canal hematoma. Disc levels: Moderate diffuse cervical disc degeneration and severe facet arthrosis. Interbody and left facet ankylosis at C4-5. Moderate to severe multilevel neural foraminal stenosis. Mild-to-moderate spinal stenosis at C3-4 and C4-5. Upper chest: Clear lung apices. Other: Moderate calcific atherosclerosis at the carotid bifurcations. IMPRESSION: 1. No evidence of acute intracranial abnormality. 2. No acute cervical spine fracture. 3. Advanced cervical disc and facet degeneration. Electronically Signed   By: Sebastian Ache M.D.   On: 01/22/2023 18:39   CT Cervical Spine Wo Contrast  Result Date: 01/22/2023 CLINICAL DATA:  Head trauma, minor (Age >= 65y); Neck trauma (Age >= 65y). Fall. EXAM: CT HEAD WITHOUT CONTRAST CT CERVICAL SPINE WITHOUT CONTRAST TECHNIQUE: Multidetector CT imaging of the head and cervical spine was performed following the standard protocol without intravenous contrast. Multiplanar CT image reconstructions of the cervical spine were also generated. RADIATION DOSE REDUCTION: This exam was performed according to the departmental dose-optimization program which includes automated exposure control, adjustment of the mA and/or kV according to patient size and/or use of iterative reconstruction technique. COMPARISON:  None Available. FINDINGS: CT HEAD FINDINGS Brain: There is no evidence of an acute infarct, intracranial hemorrhage, mass, midline shift, or extra-axial fluid collection. The ventricles and sulci are within normal limits for age. Hypodensities in the cerebral white matter are nonspecific but compatible with very mild chronic small vessel ischemic disease. Vascular: Calcified atherosclerosis at the skull  base. No hyperdense vessel. Skull: No acute fracture or suspicious osseous lesion. Sinuses/Orbits: Paranasal sinuses and mastoid air cells are clear. Bilateral cataract extraction. Other: None. CT CERVICAL SPINE FINDINGS Alignment: Cervical spine straightening.  No listhesis. Skull base and vertebrae: No acute fracture or suspicious osseous lesion. Soft tissues and spinal canal: No prevertebral fluid or swelling. No visible canal hematoma. Disc levels: Moderate diffuse cervical disc degeneration and severe facet arthrosis. Interbody and left facet ankylosis at C4-5. Moderate to severe multilevel neural foraminal stenosis. Mild-to-moderate spinal stenosis at C3-4 and C4-5. Upper chest: Clear lung apices. Other: Moderate calcific atherosclerosis at the carotid bifurcations. IMPRESSION: 1. No evidence of acute intracranial abnormality. 2. No acute cervical spine fracture. 3. Advanced cervical disc and facet degeneration. Electronically Signed   By: Sebastian Ache M.D.   On: 01/22/2023 18:39   DG Shoulder Right  Result Date: 01/22/2023 CLINICAL DATA:  Fall.  Syncope. EXAM: RIGHT SHOULDER - 2+ VIEW COMPARISON:  None Available. FINDINGS: The no signs of acute fracture or dislocation. Moderate to severe degenerative changes are identified at the acromioclavicular joint. Soft tissues are unremarkable. IMPRESSION: 1. No acute findings. 2. AC joint osteoarthritis. Electronically Signed   By: Signa Kell M.D.   On: 01/22/2023 16:06   DG Wrist Complete Right  Result Date: 01/22/2023 CLINICAL DATA:  Pain, fall. EXAM: RIGHT WRIST - COMPLETE 3 VIEW COMPARISON:  None Available. FINDINGS: There is no evidence of fracture or dislocation. Severe radiocarpal joint space narrowing and sclerosis consistent with osteoarthritis. IMPRESSION: Degenerative changes.  No acute osseous abnormalities. Electronically Signed   By: Layla Maw M.D.   On: 01/22/2023 16:04   DG Chest Portable 1 View  Result Date: 01/22/2023 CLINICAL  DATA:  Chest pain, syncope. EXAM: PORTABLE CHEST 1 VIEW COMPARISON:  Nov 27, 2021. FINDINGS: The  heart size and mediastinal contours are within normal limits. Status post coronary artery bypass graft. Both lungs are clear. The visualized skeletal structures are unremarkable. IMPRESSION: No active disease. Electronically Signed   By: Lupita Raider M.D.   On: 01/22/2023 15:43      LOS: 1 day    Jacquelin Hawking, MD [facility] 01/23/2023, 10:20 AM   If 7PM-7AM, please contact night-coverage [email or web address]

## 2023-01-23 NOTE — H&P (View-Only) (Signed)
Referring Provider: Dr. Jodean Lima  Primary Care Physician:  Merri Brunette, MD Primary Gastroenterologist:  Dr. Russella Dar   Reason for Consultation: N/V, hematemesis and melena  HPI: John [name] is a 87 y.o. male with a past medical history of anxiety, hypertension, hyperlipidemia, atrial fibrillation, coronary artery disease s/p CABG 2009, s/p MP repair, carotid stenosis, PE 2007, prostate cancer s/p prostatectomy, colon polyps and Zenker's diverticulum s/p diverticulectomy.  He presented to the ED 01/22/2023 following a syncopal episode with generalized weakness, hematemesis and melena.  He also endorsed having epigastric pain mild shortness of breath.  No chest pain.  On Coumadin, last dose taken at 8:30pm on 7/18. Labs in the ED showed a WBC count of 13.7.  Hemoglobin 11.5 ( Hg 12.07 June 2021).  Hematocrit 36.2.  Platelet 288.  Potassium 5.5.  BUN 53 (BUN 02 May 2022). Creatinine 1.82.  Albumin 3.5.  Total bili 0.4.  Alk phos 87.  AST 28.  ALT 28.  INR 1.8.  Troponin 14 -> 17.  TSH 3.665.  FOBT positive.  CT without acute intracranial abnormality.  CTAP without contrast showed a normal stomach ago without evidence of a perforation and no bowel wall thickening. Mild diverticulosis to the descending and sigmoid colon was noted. He received Protonix 80 mg IV x 1 bolus. GI consult was requested for further evaluation for GI bleed.  Labs today showed a hemoglobin level dropped to 9.6.  Hematocrit 29.5.  Repeat hemoglobin 9.2 -> 9.3.  BUN 51.  Creatinine 1.53.  Protonix re-bolus with continuous IV infusion ordered by the hospitalist at this time.   No further hematemesis or melena since admission.  He described feeling sweaty on his way to the bathroom yesterday, stomach felt funny.  He passed 1 loose black stool an hour later he went back to the bathroom and had a syncopal episode brief loss of consciousness.  Upon awakening he saw a lot of blood on his shirt and he passed a large amount of black  stool.  He pushed his medical alert button and his neighbor and EMS were alerted.  His last dose of Coumadin was taken Thursday evening around 8:30 PM.  No NSAID use.  No alcohol use.  No GERD symptoms or dysphagia.  He has a remote history of dysphagia and underwent an EGD in 2018 which showed a small hiatal hernia and the esophagus was empirically dilated.  He typically passes a normal brown bowel movement daily.  History of colon polyps, last colonoscopy 11/2016 identified 4 polyps removed from the colon.   GI PROCEDURES:  EGD 11/17/2016 done due to dysphagia: - Z-line variable, 38 cm from the incisors. Biopsied.  - No endoscopic esophageal abnormality to explain patient's dysphagia. Esophagus dilated. Dilated.  - Small hiatal hernia.  - Normal duodenal bulb and second portion of the duodenum.  Colonoscopy 11/17/2016: - Four 6 to 7 mm polyps in the descending colon and in the transverse colon, removed with a cold snare. Resected and retrieved. - Internal hemorrhoids. - The examination was otherwise normal on direct and retroflexion views.  1. Surgical [P], transverse - 2, and descending - 2, polyp (4) - TUBULAR ADENOMA (6 FRAGMENTS). - NO HIGH GRADE DYSPLASIA OR MALIGNANCY. 2. Surgical [P], distal esophagus (irregular Z line) - GASTROESOPHAGEAL JUNCTION MUCOSA WITH INFLAMMATORY CHANGES CONSISTENT WITH REFLUX. - NO INTESTINAL METAPLASIA, DYSPLASIA OR MALIGNANCY  EGD 04/25/2012: Gastritis Small hiatal hernia  Colonoscopy 05/20/2011: 4 polyps removed from the colon  Past Medical History:  Diagnosis  Date   Anxiety    Atrial fibrillation (HCC)    CAD (coronary artery disease)    s/p CABG   Carotid stenosis    mild by doppler   Complication of anesthesia    zenkers  diverticulum   Deep vein thrombophlebitis of leg (HCC)    DJD (degenerative joint disease)    Elevated TSH    secondary to Amio-normal free T4.    Essential hypertension, benign    GERD (gastroesophageal reflux disease)     Hemorrhoids    Hiatal hernia    Hyperlipidemia    PAF (paroxysmal atrial fibrillation) (HCC)    Amiodarone   PE (pulmonary embolism)    pre 2007. Chronic anticoagulation   Prostate cancer (HCC)    Sebaceous cyst    neck   Seborrheic dermatitis    Tubular adenoma of colon 05/2011    Past Surgical History:  Procedure Laterality Date   bilateral shoulder surgery     CORONARY ARTERY BYPASS GRAFT     x2    2009   I & D EXTREMITY  07/09/2011   Procedure: IRRIGATION AND DEBRIDEMENT EXTREMITY;  Surgeon: Tami Ribas;  Location: [facility];  Service: Orthopedics;  Laterality: Left;   left knee arthroscopy     MITRAL VALVE REPAIR     prostate cancer surgery  11/1996   radial retropubic prostatectomy   TANDEM AND OVOID INSERTION     VASECTOMY     ZENKER'S DIVERTICULECTOMY N/A 11/07/2012   Procedure: ZENKER'S DIVERTICULECTOMY ENDOSCOPIC;  Surgeon: Christia Reading, MD;  Location: [facility] OR;  Service: ENT;  Laterality: N/A;    Prior to Admission medications   Medication Sig Start Date End Date Taking? Authorizing Provider  amiodarone (PACERONE) 200 MG tablet TAKE 1 TABLET BY MOUTH  DAILY Patient taking differently: Take 200 mg by mouth daily. 03/02/22  Yes Lewayne Bunting, MD  amLODipine (NORVASC) 5 MG tablet TAKE 1 TABLET BY MOUTH DAILY Patient taking differently: Take 5 mg by mouth daily. 04/29/22  Yes Lewayne Bunting, MD  Cholecalciferol (VITAMIN D-3 PO) Take 1 tablet by mouth daily.   Yes [provider]  ezetimibe (ZETIA) 10 MG tablet TAKE 1 TABLET BY MOUTH  DAILY Patient taking differently: Take 10 mg by mouth daily. 11/16/18  Yes Michele Mcalpine, MD  furosemide (LASIX) 20 MG tablet Take 20 mg by mouth daily as needed for fluid.   Yes [provider]  indapamide (LOZOL) 1.25 MG tablet Take 0.625 mg by mouth daily. 12/17/22  Yes [provider]  levothyroxine (SYNTHROID) 25 MCG tablet TAKE 1 TABLET BY MOUTH DAILY  BEFORE BREAKFAST 08/12/22  Yes  Crenshaw, [name], MD  losartan (COZAAR) 100 MG tablet TAKE 1 TABLET BY MOUTH DAILY Patient taking differently: Take 100 mg by mouth daily. 07/23/22  Yes Lewayne Bunting, MD  warfarin (COUMADIN) 1 MG tablet TAKE 1 AND 1/2 TO 2 TABLETS BY  MOUTH DAILY OR AS PRESCRIBED BY  COUMADIN CLINIC Patient taking differently: Take 1.5-2 mg by mouth See admin instructions. Take 1 and 1/2 tablets every day except on Thurs and Saturdays take two tablets per patient 09/28/22  Yes Lewayne Bunting, MD  doxazosin (CARDURA) 8 MG tablet TAKE 1 TABLET BY MOUTH AT  BEDTIME Patient not taking: Reported on 01/22/2023 04/29/22   Lewayne Bunting, MD  Vitamin D, Ergocalciferol, (DRISDOL) 50000 units CAPS capsule Take 1 capsule (50,000 Units total) by mouth every 7 (seven) days. Patient not taking:  Reported on 01/22/2023 09/14/16   Michele Mcalpine, MD    Current Facility-Administered Medications  Medication Dose Route Frequency Provider Last Rate Last Admin   0.45 % sodium chloride infusion   Intravenous Continuous Lurline Del, MD 75 mL/hr at 01/23/23 1023 New Bag at 01/23/23 1023   acetaminophen (TYLENOL) tablet 650 mg  650 mg Oral Q6H PRN Lurline Del, MD   650 mg at 01/23/23 0981   Or   acetaminophen (TYLENOL) suppository 650 mg  650 mg Rectal Q6H PRN Lurline Del, MD       albuterol (PROVENTIL) (2.5 MG/3ML) 0.083% nebulizer solution 2.5 mg  2.5 mg Nebulization Q2H PRN Lurline Del, MD       amiodarone (PACERONE) tablet 200 mg  200 mg Oral Daily Skip Mayer A, MD   200 mg at 01/23/23 1914   amLODipine (NORVASC) tablet 5 mg  5 mg Oral Daily Lurline Del, MD   5 mg at 01/23/23 7829   cholecalciferol (VITAMIN D3) 25 MCG (1000 UNIT) tablet 5,000 Units  5,000 Units Oral Daily Lurline Del, MD   5,000 Units at 01/23/23 5621   ezetimibe (ZETIA) tablet 10 mg  10 mg Oral Daily Skip Mayer A, MD   10 mg at 01/22/23 2211   fentaNYL (SUBLIMAZE) injection 12.5 mcg  12.5 mcg  Intravenous Q3H PRN Lurline Del, MD   12.5 mcg at 01/23/23 0407   indapamide (LOZOL) tablet 0.625 mg  0.625 mg Oral Daily Lurline Del, MD       levothyroxine (SYNTHROID) tablet 25 mcg  25 mcg Oral Q0600 Lurline Del, MD   25 mcg at 01/23/23 3086   ondansetron [facility]) tablet 4 mg  4 mg Oral Q6H PRN Lurline Del, MD       Or   ondansetron [facility]) injection 4 mg  4 mg Intravenous Q6H PRN Lurline Del, MD        Allergies as of 01/22/2023 - Review Complete 01/22/2023  Allergen Reaction Noted   Other Other (See Comments) 08/08/2019   Iodine Hives 12/11/2008   Iohexol Hives 07/14/2007   Statins Other (See Comments)     Family History  Problem Relation Age of Onset   Heart attack Father    Stroke Mother    Hypertension Mother    Hypertension Brother    Heart disease Brother    Prostate cancer Brother     Social History   Socioeconomic History   Marital status: Married    Spouse name: margaret   Number of children: 3   Years of education: Not on file   Highest education level: Not on file  Occupational History   Occupation: retired    Associate Professor: RETIRED  Tobacco Use   Smoking status: Former    Current packs/day: 0.00    Average packs/day: 1 pack/day for 12.0 years (12.0 ttl pk-yrs)    Types: Cigarettes    Start date: 07/06/1956    Quit date: 07/06/1968    Years since quitting: 54.5   Smokeless tobacco: Never  Substance and Sexual Activity   Alcohol use: Yes    Comment: social use   Drug use: No   Sexual activity: Not on file  Other Topics Concern   Not on file  Social History Narrative   Not on file   Social Determinants of Health   Financial Resource Strain: Not on file  Food Insecurity: No Food Insecurity (01/23/2023)   Hunger Vital Sign  Worried About Programme researcher, broadcasting/film/video in the Last Year: Never true    Ran Out of Food in the Last Year: Never true  Transportation Needs: No Transportation Needs (01/23/2023)   PRAPARE -  Administrator, Civil Service (Medical): No    Lack of Transportation (Non-Medical): No  Physical Activity: Not on file  Stress: Not on file  Social Connections: Not on file  Intimate Partner Violence: Not At Risk (01/23/2023)   Humiliation, Afraid, Rape, and Kick questionnaire    Fear of Current or Ex-Partner: No    Emotionally Abused: No    Physically Abused: No    Sexually Abused: No   Review of Systems: Gen: Denies fever, sweats or chills. No weight loss.  CV: Denies chest pain, palpitations or edema. Resp:+ SOB yesterday. GI: See HPI.   GU : Denies urinary burning, blood in urine, increased urinary frequency or incontinence. MS: Denies joint pain, muscles aches or weakness. Derm: Denies rash, itchiness, skin lesions or unhealing ulcers. Psych: Denies depression, anxiety, memory loss or confusion. Heme: + Easy bruising.  Neuro:  Denies headaches, dizziness or paresthesias. Endo: + Hypothyroidism.  Physical Exam: Vital signs in last 24 hours: Temp:  [97.4 F (36.3 C)-98.3 F (36.8 C)] 98.3 F (36.8 C) (07/20 1125) Pulse Rate:  [67-81] 76 (07/20 1125) Resp:  [11-23] 19 (07/20 1125) BP: (130-162)/(43-59) 141/51 (07/20 1125) SpO2:  [95 %-100 %] 98 % (07/20 1125) Weight:  [73 kg] 73 kg (07/19 1504) Last BM Date : 01/22/23 General: Alert 87 year old male fatigued-appearing in no acute distress. Head:  Normocephalic and atraumatic. Eyes:  No scleral icterus. Conjunctiva pink. Ears:  Normal auditory acuity. Nose:  No deformity, discharge or lesions. Mouth:  Dentition intact. No ulcers or lesions.  Neck:  Supple. No lymphadenopathy or thyromegaly.  Lungs: Breath sounds clear throughout. No wheezes, rhonchi or crackles.  Heart: Rate and rhythm, no murmurs. Abdomen:, Nondistended.  Nontender.  Positive bowel sounds to all 4 quadrants. Rectal: Deferred. Musculoskeletal:  Symmetrical without gross deformities.  Pulses:  Normal pulses noted. Extremities:  Right  forearm with Kerlix dressing intact.  No edema. Neurologic:  Alert and  oriented x 4. No focal deficits.  Skin:  Intact without significant lesions or rashes. Psych:  Alert and cooperative. Normal mood and affect.  Intake/Output from previous day: 07/19 0701 - 07/20 0700 In: 1000 [IV Piggyback:1000] Out: 125 [Urine:125] Intake/Output this shift: Total I/O In: 0  Out: 400 [Urine:400]  Lab Results: Recent Labs    01/22/23 1535 01/22/23 1549 01/23/23 0458 01/23/23 0648 01/23/23 0815  WBC 13.7*  --   --   --   --   HGB 11.5*   < > 9.6* 9.2* 9.3*  HCT 36.2*   < > 29.5* 28.6* 28.7*  PLT 288  --   --   --   --    < > = values in this interval not displayed.   BMET Recent Labs    01/22/23 1535 01/22/23 1549 01/23/23 0815  NA 138 140 137  K 5.5* 5.6* 4.6  CL 109 110 108  CO2 21*  --  22  GLUCOSE 132* 122* 97  BUN 53* 47* 51*  CREATININE 1.82* 1.80* 1.53*  CALCIUM 8.6*  --  8.2*   LFT Recent Labs    01/23/23 0815  PROT 5.3*  ALBUMIN 3.0*  AST 24  ALT 22  ALKPHOS 67  BILITOT 0.4   PT/INR Recent Labs    01/22/23 1535  LABPROT  21.1*  INR 1.8*   Hepatitis Panel No results for input(s): "HEPBSAG", "HCVAB", "HEPAIGM", "HEPBIGM" in the last 72 hours.    Studies/Results: CT ABDOMEN PELVIS WO CONTRAST  Result Date: 01/22/2023 CLINICAL DATA:  Abdominal pain, acute, nonlocalized. Rectal bleeding. Evaluate for colitis. EXAM: CT ABDOMEN AND PELVIS WITHOUT CONTRAST TECHNIQUE: Multidetector CT imaging of the abdomen and pelvis was performed following the standard protocol without IV contrast. RADIATION DOSE REDUCTION: This exam was performed according to the departmental dose-optimization program which includes automated exposure control, adjustment of the mA and/or kV according to patient size and/or use of iterative reconstruction technique. COMPARISON:  None Available. FINDINGS: Lower chest: Mild scarring at both lung bases. Atherosclerosis of the aorta and coronary  arteries status post median sternotomy and probable mitral valve replacement. The heart size is normal. There is a small hiatal hernia. Hepatobiliary: The liver appears unremarkable as imaged in the noncontrast state. No evidence of gallstones, gallbladder wall thickening or biliary dilatation. Pancreas: Unremarkable. No pancreatic ductal dilatation or surrounding inflammatory changes. Spleen: Normal in size without focal abnormality. Adrenals/Urinary Tract: Both adrenal glands appear normal. No evidence of urinary tract calculus, suspicious renal lesion or hydronephrosis. The bladder appears unremarkable for its degree of distention. Stomach/Bowel: No enteric contrast administered. The stomach appears unremarkable for its degree of distension. No evidence of bowel wall thickening, distention or surrounding inflammatory change. The appendix appears normal. Mild descending and sigmoid colon diverticulosis without evidence of acute inflammation. Vascular/Lymphatic: There are no enlarged abdominal or pelvic lymph nodes. Aortic and branch vessel atherosclerosis without evidence of aneurysm. Postsurgical changes in both inguinal regions. Reproductive: Status post prostatectomy. No evidence of pelvic mass. Other: No evidence of abdominal wall mass or hernia. No ascites or pneumoperitoneum. Musculoskeletal: No acute or significant osseous findings. Multilevel thoracolumbar spondylosis and ankylosis. The left sacroiliac joint is ankylosed. There are mild degenerative changes at both hips. Healed median sternotomy. Moderate bilateral gynecomastia. Unless specific follow-up recommendations are mentioned in the findings or impression sections, no imaging follow-up of any mentioned incidental findings is recommended. IMPRESSION: 1. No acute findings or explanation for the patient's symptoms. 2. Mild distal colonic diverticulosis without evidence of acute inflammation. Small hiatal hernia. 3. Status post prostatectomy without  evidence of local recurrence or metastatic disease. 4.  Aortic Atherosclerosis (ICD10-I70.0). Electronically Signed   By: Carey Bullocks M.D.   On: 01/22/2023 18:43   CT Head Wo Contrast  Result Date: 01/22/2023 CLINICAL DATA:  Head trauma, minor (Age >= 65y); Neck trauma (Age >= 65y). Fall. EXAM: CT HEAD WITHOUT CONTRAST CT CERVICAL SPINE WITHOUT CONTRAST TECHNIQUE: Multidetector CT imaging of the head and cervical spine was performed following the standard protocol without intravenous contrast. Multiplanar CT image reconstructions of the cervical spine were also generated. RADIATION DOSE REDUCTION: This exam was performed according to the departmental dose-optimization program which includes automated exposure control, adjustment of the mA and/or kV according to patient size and/or use of iterative reconstruction technique. COMPARISON:  None Available. FINDINGS: CT HEAD FINDINGS Brain: There is no evidence of an acute infarct, intracranial hemorrhage, mass, midline shift, or extra-axial fluid collection. The ventricles and sulci are within normal limits for age. Hypodensities in the cerebral white matter are nonspecific but compatible with very mild chronic small vessel ischemic disease. Vascular: Calcified atherosclerosis at the skull base. No hyperdense vessel. Skull: No acute fracture or suspicious osseous lesion. Sinuses/Orbits: Paranasal sinuses and mastoid air cells are clear. Bilateral cataract extraction. Other: None. CT CERVICAL SPINE FINDINGS Alignment: Cervical spine  straightening.  No listhesis. Skull base and vertebrae: No acute fracture or suspicious osseous lesion. Soft tissues and spinal canal: No prevertebral fluid or swelling. No visible canal hematoma. Disc levels: Moderate diffuse cervical disc degeneration and severe facet arthrosis. Interbody and left facet ankylosis at C4-5. Moderate to severe multilevel neural foraminal stenosis. Mild-to-moderate spinal stenosis at C3-4 and C4-5. Upper  chest: Clear lung apices. Other: Moderate calcific atherosclerosis at the carotid bifurcations. IMPRESSION: 1. No evidence of acute intracranial abnormality. 2. No acute cervical spine fracture. 3. Advanced cervical disc and facet degeneration. Electronically Signed   By: Sebastian Ache M.D.   On: 01/22/2023 18:39   CT Cervical Spine Wo Contrast  Result Date: 01/22/2023 CLINICAL DATA:  Head trauma, minor (Age >= 65y); Neck trauma (Age >= 65y). Fall. EXAM: CT HEAD WITHOUT CONTRAST CT CERVICAL SPINE WITHOUT CONTRAST TECHNIQUE: Multidetector CT imaging of the head and cervical spine was performed following the standard protocol without intravenous contrast. Multiplanar CT image reconstructions of the cervical spine were also generated. RADIATION DOSE REDUCTION: This exam was performed according to the departmental dose-optimization program which includes automated exposure control, adjustment of the mA and/or kV according to patient size and/or use of iterative reconstruction technique. COMPARISON:  None Available. FINDINGS: CT HEAD FINDINGS Brain: There is no evidence of an acute infarct, intracranial hemorrhage, mass, midline shift, or extra-axial fluid collection. The ventricles and sulci are within normal limits for age. Hypodensities in the cerebral white matter are nonspecific but compatible with very mild chronic small vessel ischemic disease. Vascular: Calcified atherosclerosis at the skull base. No hyperdense vessel. Skull: No acute fracture or suspicious osseous lesion. Sinuses/Orbits: Paranasal sinuses and mastoid air cells are clear. Bilateral cataract extraction. Other: None. CT CERVICAL SPINE FINDINGS Alignment: Cervical spine straightening.  No listhesis. Skull base and vertebrae: No acute fracture or suspicious osseous lesion. Soft tissues and spinal canal: No prevertebral fluid or swelling. No visible canal hematoma. Disc levels: Moderate diffuse cervical disc degeneration and severe facet arthrosis.  Interbody and left facet ankylosis at C4-5. Moderate to severe multilevel neural foraminal stenosis. Mild-to-moderate spinal stenosis at C3-4 and C4-5. Upper chest: Clear lung apices. Other: Moderate calcific atherosclerosis at the carotid bifurcations. IMPRESSION: 1. No evidence of acute intracranial abnormality. 2. No acute cervical spine fracture. 3. Advanced cervical disc and facet degeneration. Electronically Signed   By: Sebastian Ache M.D.   On: 01/22/2023 18:39   DG Shoulder Right  Result Date: 01/22/2023 CLINICAL DATA:  Fall.  Syncope. EXAM: RIGHT SHOULDER - 2+ VIEW COMPARISON:  None Available. FINDINGS: The no signs of acute fracture or dislocation. Moderate to severe degenerative changes are identified at the acromioclavicular joint. Soft tissues are unremarkable. IMPRESSION: 1. No acute findings. 2. AC joint osteoarthritis. Electronically Signed   By: Signa Kell M.D.   On: 01/22/2023 16:06   DG Wrist Complete Right  Result Date: 01/22/2023 CLINICAL DATA:  Pain, fall. EXAM: RIGHT WRIST - COMPLETE 3 VIEW COMPARISON:  None Available. FINDINGS: There is no evidence of fracture or dislocation. Severe radiocarpal joint space narrowing and sclerosis consistent with osteoarthritis. IMPRESSION: Degenerative changes.  No acute osseous abnormalities. Electronically Signed   By: Layla Maw M.D.   On: 01/22/2023 16:04   DG Chest Portable 1 View  Result Date: 01/22/2023 CLINICAL DATA:  Chest pain, syncope. EXAM: PORTABLE CHEST 1 VIEW COMPARISON:  Nov 27, 2021. FINDINGS: The heart size and mediastinal contours are within normal limits. Status post coronary artery bypass graft. Both lungs are clear.  The visualized skeletal structures are unremarkable. IMPRESSION: No active disease. Electronically Signed   By: Lupita Raider M.D.   On: 01/22/2023 15:43    IMPRESSION/PLAN:  87 year old male admitted to the hospital 01/22/2023 following episode of syncope with hematemesis and melena.  Admission  hemoglobin 11.5 -> 9.3.  Last dose of Coumadin was 7/18 on 8:30pm.  Hemodynamically stable. -Clear liquid diet -NPO for possible EGD tomorrow if anesthesia available -EGD benefits and risks discussed including risk with sedation, risk of bleeding, perforation and infection  -Continue to hold Coumadin -Check H&H every 6 hours x 24 hours -Transfuse for hemoglobin level less than 8 -Continue PPI infusion -Ondansetron 4 mg p.o. or IV every 6 hours as needed -Await further recommendations per Dr. Lavon Paganini  History of coronary artery disease s/p CABG, s/p MP repair   History of atrial fibrillation -Continue to hold Coumadin  History of PE 2007     Malachi Carl Kennedy-Smith  01/23/2023, 2:44PM     Attending physician's note  I have taken a history, reviewed the chart and examined the patient. I performed a substantive portion of this encounter, including complete performance of at least one of the key components, in conjunction with the APP. I agree with the APP's note, impression and recommendations.   87 year old male with history of CAD s/p CABG, carotid stenosis, PE.  On chronic anticoagulation with Coumadin, last dose on 01/21/2023 INR 1.8  Presented following episode of syncope with hematemesis and melena Hemoglobin declined from baseline 11.5-9.3  Concerning for acute upper GI hemorrhage, will plan for EGD tomorrow morning He is hemodynamically stable Clear liquid diet  Monitor hemoglobin and transfuse if below 7 PPI gtt. Continue to hold Coumadin Follow-up INR in the morning N.p.o. after midnight  The patient was provided an opportunity to ask questions and all were answered. The patient agreed with the plan and demonstrated an understanding of the instructions.  Iona Beard , MD [phone]

## 2023-01-24 ENCOUNTER — Other Ambulatory Visit: Payer: Self-pay

## 2023-01-24 ENCOUNTER — Inpatient Hospital Stay (HOSPITAL_COMMUNITY): Payer: Medicare Other | Admitting: Certified Registered Nurse Anesthetist

## 2023-01-24 ENCOUNTER — Encounter (HOSPITAL_COMMUNITY)
Admission: EM | Disposition: A | Discharge status: Home / Self Care | Payer: Self-pay | Source: Home / Self Care | Attending: Family Medicine

## 2023-01-24 ENCOUNTER — Encounter (HOSPITAL_COMMUNITY): Payer: Self-pay | Admitting: Internal Medicine

## 2023-01-24 DIAGNOSIS — K317 Polyp of stomach and duodenum: Secondary | ICD-10-CM | POA: Diagnosis not present

## 2023-01-24 DIAGNOSIS — I129 Hypertensive chronic kidney disease with stage 1 through stage 4 chronic kidney disease, or unspecified chronic kidney disease: Secondary | ICD-10-CM

## 2023-01-24 DIAGNOSIS — K920 Hematemesis: Secondary | ICD-10-CM | POA: Diagnosis not present

## 2023-01-24 DIAGNOSIS — K254 Chronic or unspecified gastric ulcer with hemorrhage: Secondary | ICD-10-CM | POA: Diagnosis not present

## 2023-01-24 DIAGNOSIS — K921 Melena: Secondary | ICD-10-CM | POA: Diagnosis not present

## 2023-01-24 DIAGNOSIS — E039 Hypothyroidism, unspecified: Secondary | ICD-10-CM

## 2023-01-24 DIAGNOSIS — N179 Acute kidney failure, unspecified: Secondary | ICD-10-CM | POA: Diagnosis not present

## 2023-01-24 DIAGNOSIS — E785 Hyperlipidemia, unspecified: Secondary | ICD-10-CM

## 2023-01-24 DIAGNOSIS — I480 Paroxysmal atrial fibrillation: Secondary | ICD-10-CM | POA: Diagnosis not present

## 2023-01-24 DIAGNOSIS — N189 Chronic kidney disease, unspecified: Secondary | ICD-10-CM

## 2023-01-24 DIAGNOSIS — F17210 Nicotine dependence, cigarettes, uncomplicated: Secondary | ICD-10-CM

## 2023-01-24 DIAGNOSIS — I2510 Atherosclerotic heart disease of native coronary artery without angina pectoris: Secondary | ICD-10-CM

## 2023-01-24 DIAGNOSIS — K922 Gastrointestinal hemorrhage, unspecified: Secondary | ICD-10-CM | POA: Diagnosis not present

## 2023-01-24 HISTORY — PX: ESOPHAGOGASTRODUODENOSCOPY (EGD) WITH PROPOFOL: SHX5813

## 2023-01-24 LAB — BASIC METABOLIC PANEL
Anion gap: 9 (ref 5–15)
BUN: 35 mg/dL — ABNORMAL HIGH (ref 8–23)
CO2: 23 mmol/L (ref 22–32)
Calcium: 8 mg/dL — ABNORMAL LOW (ref 8.9–10.3)
Chloride: 107 mmol/L (ref 98–111)
Creatinine, Ser: 1.52 mg/dL — ABNORMAL HIGH (ref 0.61–1.24)
GFR, Estimated: 45 mL/min — ABNORMAL LOW (ref >60)
Glucose, Bld: 88 mg/dL (ref 70–99)
Potassium: 4.4 mmol/L (ref 3.5–5.1)
Sodium: 139 mmol/L (ref 135–145)

## 2023-01-24 LAB — CBC
HCT: 28.6 % — ABNORMAL LOW (ref 39.0–52.0)
Hemoglobin: 9.2 g/dL — ABNORMAL LOW (ref 13.0–17.0)
MCH: 28.4 pg (ref 26.0–34.0)
MCHC: 32.2 g/dL (ref 30.0–36.0)
MCV: 88.3 fL (ref 80.0–100.0)
Platelets: 217 10*3/uL (ref 150–400)
RBC: 3.24 MIL/uL — ABNORMAL LOW (ref 4.22–5.81)
RDW: 15.1 % (ref 11.5–15.5)
WBC: 7.9 10*3/uL (ref 4.0–10.5)
nRBC: 0 % (ref 0.0–0.2)

## 2023-01-24 LAB — PROTIME-INR
INR: 2.1 — ABNORMAL HIGH (ref 0.8–1.2)
Prothrombin Time: 23.6 seconds — ABNORMAL HIGH (ref 11.4–15.2)

## 2023-01-24 SURGERY — ESOPHAGOGASTRODUODENOSCOPY (EGD) WITH PROPOFOL
Anesthesia: Monitor Anesthesia Care

## 2023-01-24 MED ORDER — LACTATED RINGERS IV SOLN: INTRAVENOUS | Status: DC | PRN

## 2023-01-24 MED ORDER — PROPOFOL 500 MG/50ML IV EMUL
INTRAVENOUS | Status: DC | PRN
  Administered 2023-01-24: 60 mg via INTRAVENOUS
  Administered 2023-01-24: 100 ug/kg/min via INTRAVENOUS

## 2023-01-24 MED ORDER — SUCRALFATE 1 GM/10ML PO SUSP
1.0000 g | Freq: Three times a day (TID) | ORAL | Status: DC
  Administered 2023-01-24 – 2023-01-28 (×15): 1 g via ORAL
  Filled 2023-01-24 (×15): qty 10

## 2023-01-24 SURGICAL SUPPLY — 15 items

## 2023-01-24 NOTE — Anesthesia Postprocedure Evaluation (Signed)
Anesthesia Post Note  Patient: John [name]  Procedure(s) Performed: ESOPHAGOGASTRODUODENOSCOPY (EGD) WITH PROPOFOL     Patient location during evaluation: PACU Anesthesia Type: MAC Level of consciousness: awake and alert Pain management: pain level controlled Vital Signs Assessment: post-procedure vital signs reviewed and stable Respiratory status: spontaneous breathing, nonlabored ventilation and respiratory function stable Cardiovascular status: stable and blood pressure returned to baseline Anesthetic complications: no   No notable events documented.  Last Vitals:  Vitals:   01/24/23 0830 01/24/23 0844  BP: (!) 140/54 131/61  Pulse: 73 73  Resp: 12 16  Temp:  36.6 C  SpO2: 99% 99%    Last Pain:  Vitals:   01/24/23 0844  TempSrc: Oral  PainSc:                  Beryle Lathe

## 2023-01-24 NOTE — Transfer of Care (Signed)
Immediate Anesthesia Transfer of Care Note  Patient: John [name]  Procedure(s) Performed: ESOPHAGOGASTRODUODENOSCOPY (EGD) WITH PROPOFOL  Patient Location: PACU  Anesthesia Type:MAC  Level of Consciousness: awake, alert , and oriented  Airway & Oxygen Therapy: Patient Spontanous Breathing  Post-op Assessment: Report given to RN and Post -op Vital signs reviewed and stable  Post vital signs: Reviewed and stable  Last Vitals:  Vitals Value Taken Time  BP 120/53 01/24/23 0812  Temp    Pulse 69 01/24/23 0814  Resp 16 01/24/23 0814  SpO2 98 % 01/24/23 0814  Vitals shown include unfiled device data.  Last Pain:  Vitals:   01/24/23 0702  TempSrc: Temporal  PainSc: 0-No pain      Patients Stated Pain Goal: 4 (01/23/23 0921)  Complications: No notable events documented.

## 2023-01-24 NOTE — Anesthesia Preprocedure Evaluation (Addendum)
Anesthesia Evaluation  Patient identified by MRN, date of birth, ID band Patient awake    Reviewed: Allergy & Precautions, NPO status , Patient's Chart, lab work & pertinent test results  History of Anesthesia Complications Negative for: history of anesthetic complications  Airway Mallampati: III  TM Distance: >3 FB Neck ROM: Full    Dental  (+) Dental Advisory Given, Partial Upper   Pulmonary former smoker, PE   Pulmonary exam normal        Cardiovascular hypertension, Pt. on medications + CAD and + CABG  Normal cardiovascular exam+ dysrhythmias Atrial Fibrillation + Valvular Problems/Murmurs (s/p mitral ring)    '23 TTE - EF 55 to 60%. There is mild concentric left ventricular hypertrophy. The right ventricular size is mildly enlarged. Left atrial size was severely dilated. Well seated mitral 26 mm annular ring (procedure date 2009). Mean gradient 8 mmHg stable from prior. Trivial mitral valve regurgitation. Aortic valve regurgitation is mild.     Neuro/Psych  PSYCHIATRIC DISORDERS Anxiety     negative neurological ROS     GI/Hepatic Neg liver ROS, hiatal hernia,GERD  Controlled,,  Endo/Other  negative endocrine ROS    Renal/GU CRFRenal disease    Prostate cancer     Musculoskeletal  (+) Arthritis ,    Abdominal   Peds  Hematology  (+) Blood dyscrasia, anemia  INR 2.1 On coumadin    Anesthesia Other Findings   Reproductive/Obstetrics                             Anesthesia Physical Anesthesia Plan  ASA: 3  Anesthesia Plan: MAC   Post-op Pain Management: Minimal or no pain anticipated   Induction:   PONV Risk Score and Plan: 1 and Propofol infusion and Treatment may vary due to age or medical condition  Airway Management Planned: Nasal Cannula and Natural Airway  Additional Equipment: None  Intra-op Plan:   Post-operative Plan:   Informed Consent: I have reviewed the  patients History and Physical, chart, labs and discussed the procedure including the risks, benefits and alternatives for the proposed anesthesia with the patient or authorized representative who has indicated his/her understanding and acceptance.       Plan Discussed with: CRNA and Anesthesiologist  Anesthesia Plan Comments:         Anesthesia Quick Evaluation

## 2023-01-24 NOTE — Plan of Care (Signed)

## 2023-01-24 NOTE — Progress Notes (Signed)
PROGRESS NOTE    John [name]  WRU:[id number] DOB: [birth year] DOA: 01/22/2023 PCP: Merri Brunette, MD   Brief Narrative: John [name] is a 87 y.o. male with a history of CAD s/p CABG, s/p MVR, atrial fibrillation, PE/DVT on Coumadin, PAD, hyperlipidemia, hypertension, vitamin D deficiency.  Patient presented secondary to bloody stools and syncope with concern for an active GI bleed. GI consulted.   Assessment and Plan:  Acute GI bleeding Presumed secondary to GI bleeding. Patient with dark stools. FOBT positive. BUN elevated. Concern for upper GI source. GI consulted and performed an upper GI endoscopy on 7/21; results and recommendations are still pending. -Continue Protonix drip -GI recommendations: pending -Hold Coumadin. Resume pending GI recommendations  Acute anemia Appears to be acute and related to current acute GI bleeding. Hemoglobin stable.  Leukocytosis Mild. Likely reactive. Resolved.  CAD History of CABG. Patient is on Coumadin, which is held.  Atrial fibrillation Likely paroxysmal as patient is in sinus rhythm. Patient is managed on amiodarone and Coumadin as an outpatient. -Continue amiodarone  Primary hypertension Patient is on losartan and amlodipine as an outpatient. Losartan held secondary to AKI. -Continue amlodipine  AKI on CKD stage IIIa Baseline creatinine of about 1.4-1.5. Creatinine of 1.82 on admission which has improved to baseline with IV fluids.  History of PE/DVT Patient is on Coumadin as an outpatient. Coumadin held secondary to GI bleeding.  Hyperlipidemia -Continue Zetia  Hypothyroidism -Continue Synthroid  DVT prophylaxis: SCDs Code Status:   Code Status: Full Code Family Communication: None at bedside Disposition Plan: Discharge pending GI recommendations/management   Consultants:  [facility]  Procedures:  None  Antimicrobials: None    Subjective: Happy to eat. Patient reports that there was a polyp  and that the plan will be to go back on Tuesday for repeat upper endoscopy  Objective: BP (!) 115/53 (BP Location: Right Arm)   Pulse 72   Temp 97.9 F (36.6 C) (Oral)   Resp 18   Ht 5\' 9"  (1.753 m)   Wt 73 kg   SpO2 99%   BMI 23.77 kg/m   Examination:  General exam: Appears calm and comfortable Respiratory system: Clear to auscultation. Respiratory effort normal. Cardiovascular system: S1 & S2 heard, RRR. Gastrointestinal system: Abdomen is nondistended, soft and nontender. Normal bowel sounds heard. Central nervous system: Alert and oriented. No focal neurological deficits. Musculoskeletal: No edema. No calf tenderness Skin: No cyanosis. No rashes Psychiatry: Judgement and insight appear normal. Mood & affect appropriate.     Data Reviewed: I have personally reviewed following labs and imaging studies  CBC Lab Results  Component Value Date   WBC 7.9 01/24/2023   RBC 3.24 (L) 01/24/2023   HGB 9.2 (L) 01/24/2023   HCT 28.6 (L) 01/24/2023   MCV 88.3 01/24/2023   MCH 28.4 01/24/2023   PLT 217 01/24/2023   MCHC 32.2 01/24/2023   RDW 15.1 01/24/2023   LYMPHSABS 0.8 01/22/2023   MONOABS 0.6 01/22/2023   EOSABS 0.0 01/22/2023   BASOSABS 0.0 01/22/2023     Last metabolic panel Lab Results  Component Value Date   NA 139 01/24/2023   K 4.4 01/24/2023   CL 107 01/24/2023   CO2 23 01/24/2023   BUN 35 (H) 01/24/2023   CREATININE 1.52 (H) 01/24/2023   GLUCOSE 88 01/24/2023   GFRNONAA 45 (L) 01/24/2023   GFRAA 52 (L) 01/09/2020   CALCIUM 8.0 (L) 01/24/2023   PROT 5.3 (L) 01/23/2023   ALBUMIN 3.0 (L) 01/23/2023  LABGLOB 2.3 04/24/2022   AGRATIO 1.9 04/24/2022   BILITOT 0.4 01/23/2023   ALKPHOS 67 01/23/2023   AST 24 01/23/2023   ALT 22 01/23/2023   ANIONGAP 9 01/24/2023    GFR: Estimated Creatinine Clearance: 36.2 mL/min (A) (by C-G formula based on SCr of 1.52 mg/dL (H)).  No results found for this or any previous visit (from the past 240 hour(s)).     Radiology Studies: CT ABDOMEN PELVIS WO CONTRAST  Result Date: 01/22/2023 CLINICAL DATA:  Abdominal pain, acute, nonlocalized. Rectal bleeding. Evaluate for colitis. EXAM: CT ABDOMEN AND PELVIS WITHOUT CONTRAST TECHNIQUE: Multidetector CT imaging of the abdomen and pelvis was performed following the standard protocol without IV contrast. RADIATION DOSE REDUCTION: This exam was performed according to the departmental dose-optimization program which includes automated exposure control, adjustment of the mA and/or kV according to patient size and/or use of iterative reconstruction technique. COMPARISON:  None Available. FINDINGS: Lower chest: Mild scarring at both lung bases. Atherosclerosis of the aorta and coronary arteries status post median sternotomy and probable mitral valve replacement. The heart size is normal. There is a small hiatal hernia. Hepatobiliary: The liver appears unremarkable as imaged in the noncontrast state. No evidence of gallstones, gallbladder wall thickening or biliary dilatation. Pancreas: Unremarkable. No pancreatic ductal dilatation or surrounding inflammatory changes. Spleen: Normal in size without focal abnormality. Adrenals/Urinary Tract: Both adrenal glands appear normal. No evidence of urinary tract calculus, suspicious renal lesion or hydronephrosis. The bladder appears unremarkable for its degree of distention. Stomach/Bowel: No enteric contrast administered. The stomach appears unremarkable for its degree of distension. No evidence of bowel wall thickening, distention or surrounding inflammatory change. The appendix appears normal. Mild descending and sigmoid colon diverticulosis without evidence of acute inflammation. Vascular/Lymphatic: There are no enlarged abdominal or pelvic lymph nodes. Aortic and branch vessel atherosclerosis without evidence of aneurysm. Postsurgical changes in both inguinal regions. Reproductive: Status post prostatectomy. No evidence of pelvic  mass. Other: No evidence of abdominal wall mass or hernia. No ascites or pneumoperitoneum. Musculoskeletal: No acute or significant osseous findings. Multilevel thoracolumbar spondylosis and ankylosis. The left sacroiliac joint is ankylosed. There are mild degenerative changes at both hips. Healed median sternotomy. Moderate bilateral gynecomastia. Unless specific follow-up recommendations are mentioned in the findings or impression sections, no imaging follow-up of any mentioned incidental findings is recommended. IMPRESSION: 1. No acute findings or explanation for the patient's symptoms. 2. Mild distal colonic diverticulosis without evidence of acute inflammation. Small hiatal hernia. 3. Status post prostatectomy without evidence of local recurrence or metastatic disease. 4.  Aortic Atherosclerosis (ICD10-I70.0). Electronically Signed   By: Carey Bullocks M.D.   On: 01/22/2023 18:43   CT Head Wo Contrast  Result Date: 01/22/2023 CLINICAL DATA:  Head trauma, minor (Age >= 65y); Neck trauma (Age >= 65y). Fall. EXAM: CT HEAD WITHOUT CONTRAST CT CERVICAL SPINE WITHOUT CONTRAST TECHNIQUE: Multidetector CT imaging of the head and cervical spine was performed following the standard protocol without intravenous contrast. Multiplanar CT image reconstructions of the cervical spine were also generated. RADIATION DOSE REDUCTION: This exam was performed according to the departmental dose-optimization program which includes automated exposure control, adjustment of the mA and/or kV according to patient size and/or use of iterative reconstruction technique. COMPARISON:  None Available. FINDINGS: CT HEAD FINDINGS Brain: There is no evidence of an acute infarct, intracranial hemorrhage, mass, midline shift, or extra-axial fluid collection. The ventricles and sulci are within normal limits for age. Hypodensities in the cerebral white matter are nonspecific but  compatible with very mild chronic small vessel ischemic disease.  Vascular: Calcified atherosclerosis at the skull base. No hyperdense vessel. Skull: No acute fracture or suspicious osseous lesion. Sinuses/Orbits: Paranasal sinuses and mastoid air cells are clear. Bilateral cataract extraction. Other: None. CT CERVICAL SPINE FINDINGS Alignment: Cervical spine straightening.  No listhesis. Skull base and vertebrae: No acute fracture or suspicious osseous lesion. Soft tissues and spinal canal: No prevertebral fluid or swelling. No visible canal hematoma. Disc levels: Moderate diffuse cervical disc degeneration and severe facet arthrosis. Interbody and left facet ankylosis at C4-5. Moderate to severe multilevel neural foraminal stenosis. Mild-to-moderate spinal stenosis at C3-4 and C4-5. Upper chest: Clear lung apices. Other: Moderate calcific atherosclerosis at the carotid bifurcations. IMPRESSION: 1. No evidence of acute intracranial abnormality. 2. No acute cervical spine fracture. 3. Advanced cervical disc and facet degeneration. Electronically Signed   By: Sebastian Ache M.D.   On: 01/22/2023 18:39   CT Cervical Spine Wo Contrast  Result Date: 01/22/2023 CLINICAL DATA:  Head trauma, minor (Age >= 65y); Neck trauma (Age >= 65y). Fall. EXAM: CT HEAD WITHOUT CONTRAST CT CERVICAL SPINE WITHOUT CONTRAST TECHNIQUE: Multidetector CT imaging of the head and cervical spine was performed following the standard protocol without intravenous contrast. Multiplanar CT image reconstructions of the cervical spine were also generated. RADIATION DOSE REDUCTION: This exam was performed according to the departmental dose-optimization program which includes automated exposure control, adjustment of the mA and/or kV according to patient size and/or use of iterative reconstruction technique. COMPARISON:  None Available. FINDINGS: CT HEAD FINDINGS Brain: There is no evidence of an acute infarct, intracranial hemorrhage, mass, midline shift, or extra-axial fluid collection. The ventricles and sulci are  within normal limits for age. Hypodensities in the cerebral white matter are nonspecific but compatible with very mild chronic small vessel ischemic disease. Vascular: Calcified atherosclerosis at the skull base. No hyperdense vessel. Skull: No acute fracture or suspicious osseous lesion. Sinuses/Orbits: Paranasal sinuses and mastoid air cells are clear. Bilateral cataract extraction. Other: None. CT CERVICAL SPINE FINDINGS Alignment: Cervical spine straightening.  No listhesis. Skull base and vertebrae: No acute fracture or suspicious osseous lesion. Soft tissues and spinal canal: No prevertebral fluid or swelling. No visible canal hematoma. Disc levels: Moderate diffuse cervical disc degeneration and severe facet arthrosis. Interbody and left facet ankylosis at C4-5. Moderate to severe multilevel neural foraminal stenosis. Mild-to-moderate spinal stenosis at C3-4 and C4-5. Upper chest: Clear lung apices. Other: Moderate calcific atherosclerosis at the carotid bifurcations. IMPRESSION: 1. No evidence of acute intracranial abnormality. 2. No acute cervical spine fracture. 3. Advanced cervical disc and facet degeneration. Electronically Signed   By: Sebastian Ache M.D.   On: 01/22/2023 18:39   DG Shoulder Right  Result Date: 01/22/2023 CLINICAL DATA:  Fall.  Syncope. EXAM: RIGHT SHOULDER - 2+ VIEW COMPARISON:  None Available. FINDINGS: The no signs of acute fracture or dislocation. Moderate to severe degenerative changes are identified at the acromioclavicular joint. Soft tissues are unremarkable. IMPRESSION: 1. No acute findings. 2. AC joint osteoarthritis. Electronically Signed   By: Signa Kell M.D.   On: 01/22/2023 16:06   DG Wrist Complete Right  Result Date: 01/22/2023 CLINICAL DATA:  Pain, fall. EXAM: RIGHT WRIST - COMPLETE 3 VIEW COMPARISON:  None Available. FINDINGS: There is no evidence of fracture or dislocation. Severe radiocarpal joint space narrowing and sclerosis consistent with  osteoarthritis. IMPRESSION: Degenerative changes.  No acute osseous abnormalities. Electronically Signed   By: Layla Maw M.D.   On: 01/22/2023  16:04      LOS: 2 days    Jacquelin Hawking, MD [facility] 01/24/2023, 3:50 PM   If 7PM-7AM, please contact night-coverage [email or web address]

## 2023-01-24 NOTE — Progress Notes (Signed)
Patient back at bedside from EGD. Was informed by nurse that when patient came down, his linen was soaked with urine. VS obtained. Primofit placed by nurse aide.

## 2023-01-24 NOTE — Interval H&P Note (Signed)
History and Physical Interval Note:  01/24/2023 6:56 AM  John [name]  has presented today for surgery, with the diagnosis of Melena, hematemsis.  The various methods of treatment have been discussed with the patient and family. After consideration of risks, benefits and other options for treatment, the patient has consented to  Procedure(s): ESOPHAGOGASTRODUODENOSCOPY (EGD) WITH PROPOFOL (N/A) as a surgical intervention.  The patient's history has been reviewed, patient examined, no change in status, stable for surgery.  I have reviewed the patient's chart and labs.  Questions were answered to the patient's satisfaction.     Latresa Gasser

## 2023-01-24 NOTE — Progress Notes (Signed)
Patient not at bedside during change of shift report. Currently down for EGD.

## 2023-01-25 ENCOUNTER — Ambulatory Visit: Payer: Medicare Other

## 2023-01-25 ENCOUNTER — Other Ambulatory Visit: Payer: Self-pay | Admitting: Cardiology

## 2023-01-25 DIAGNOSIS — K922 Gastrointestinal hemorrhage, unspecified: Secondary | ICD-10-CM | POA: Diagnosis not present

## 2023-01-25 DIAGNOSIS — I4891 Unspecified atrial fibrillation: Secondary | ICD-10-CM

## 2023-01-25 DIAGNOSIS — N179 Acute kidney failure, unspecified: Secondary | ICD-10-CM | POA: Diagnosis not present

## 2023-01-25 DIAGNOSIS — K317 Polyp of stomach and duodenum: Secondary | ICD-10-CM | POA: Diagnosis not present

## 2023-01-25 DIAGNOSIS — I480 Paroxysmal atrial fibrillation: Secondary | ICD-10-CM | POA: Diagnosis not present

## 2023-01-25 DIAGNOSIS — K921 Melena: Secondary | ICD-10-CM | POA: Diagnosis not present

## 2023-01-25 DIAGNOSIS — R58 Hemorrhage, not elsewhere classified: Secondary | ICD-10-CM

## 2023-01-25 DIAGNOSIS — I2510 Atherosclerotic heart disease of native coronary artery without angina pectoris: Secondary | ICD-10-CM | POA: Diagnosis not present

## 2023-01-25 LAB — BASIC METABOLIC PANEL
Anion gap: 7 (ref 5–15)
BUN: 36 mg/dL — ABNORMAL HIGH (ref 8–23)
CO2: 24 mmol/L (ref 22–32)
Calcium: 8 mg/dL — ABNORMAL LOW (ref 8.9–10.3)
Chloride: 106 mmol/L (ref 98–111)
Creatinine, Ser: 1.91 mg/dL — ABNORMAL HIGH (ref 0.61–1.24)
GFR, Estimated: 34 mL/min — ABNORMAL LOW (ref >60)
Glucose, Bld: 122 mg/dL — ABNORMAL HIGH (ref 70–99)
Potassium: 4.5 mmol/L (ref 3.5–5.1)
Sodium: 137 mmol/L (ref 135–145)

## 2023-01-25 LAB — CBC
HCT: 25.6 % — ABNORMAL LOW (ref 39.0–52.0)
Hemoglobin: 8.3 g/dL — ABNORMAL LOW (ref 13.0–17.0)
MCH: 29.1 pg (ref 26.0–34.0)
MCHC: 32.4 g/dL (ref 30.0–36.0)
MCV: 89.8 fL (ref 80.0–100.0)
Platelets: 207 10*3/uL (ref 150–400)
RBC: 2.85 MIL/uL — ABNORMAL LOW (ref 4.22–5.81)
RDW: 15 % (ref 11.5–15.5)
WBC: 7.7 10*3/uL (ref 4.0–10.5)
nRBC: 0 % (ref 0.0–0.2)

## 2023-01-25 LAB — PROTIME-INR
INR: 1.8 — ABNORMAL HIGH (ref 0.8–1.2)
Prothrombin Time: 21.3 seconds — ABNORMAL HIGH (ref 11.4–15.2)

## 2023-01-25 MED ORDER — PANTOPRAZOLE SODIUM 40 MG IV SOLR
40.0000 mg | Freq: Two times a day (BID) | INTRAVENOUS | Status: DC
  Administered 2023-01-25: 40 mg via INTRAVENOUS
  Filled 2023-01-25: qty 10

## 2023-01-25 MED ORDER — SODIUM CHLORIDE 0.9 % IV SOLN: INTRAVENOUS | Status: DC

## 2023-01-25 MED ORDER — SODIUM CHLORIDE 0.9 % IV BOLUS
1000.0000 mL | Freq: Once | INTRAVENOUS | Status: AC
  Administered 2023-01-25: 1000 mL via INTRAVENOUS

## 2023-01-25 NOTE — Care Management Important Message (Signed)
Important Message  Patient Details  Name: John [name] MRN: [id number] Date of Birth: [birth year]   Medicare Important Message Given:  Yes     Dorena Bodo 01/25/2023, 3:25 PM

## 2023-01-25 NOTE — Progress Notes (Signed)
Mobility Specialist Progress Note:   01/25/23 1053  Mobility  Activity Transferred from bed to chair  Level of Assistance Minimal assist, patient does 75% or more  Assistive Device Front wheel walker  Distance Ambulated (ft) 4 ft  Activity Response Tolerated well  Mobility Referral Yes  $Mobility charge 1 Mobility  Mobility Specialist Start Time (ACUTE ONLY) 1000  Mobility Specialist Stop Time (ACUTE ONLY) 1010  Mobility Specialist Time Calculation (min) (ACUTE ONLY) 10 min   Pt received in bed, agreeable to transfer to chair. Pt needed MinA w/ bed mobility and STS. Was able to take a few steps and transfer to chair. No c/o throughout session. Pt position in chair w/ personal belongings in reach. Chair alarm is on and all needs met.   Thompson Grayer Mobility Specialist  Please contact vis Secure Chat or  Rehab Office [phone]

## 2023-01-25 NOTE — TOC Initial Note (Addendum)
Transition of Care [facility]) - Initial/Assessment Note    Patient Details  Name: John [name] MRN: [id number] Date of Birth: October 04, 1935  Transition of [facility]) CM/SW Contact:    Leone Haven, RN Phone Number: 01/25/2023, 7:52 PM  Clinical Narrative:                 From home alone, pta self ambulatory, he has PCP on file, Dr, Renne Crigler, and he has insurance on file.  He states he currently has no HH services in place at this time. Per pt eval rec HHPT.   He has rollator, bsc, and a walker at home.  He states his daughter, Efraim Kaufmann, will transport him home at dc and she is his support system, along with his other daughters, Larita Fife and Karoline Caldwell.  [facility] [place] [facility].    Expected Discharge Plan: Home/Self Care Barriers to Discharge: Continued Medical Work up   Patient Goals and CMS Choice Patient states their goals for this hospitalization and ongoing recovery are:: return home   Choice offered to / list presented to : NA      Expected Discharge Plan and Services In-house Referral: NA Discharge Planning Services: CM Consult Post Acute Care Choice: NA Living arrangements for the past 2 months: Single Family Home                 DME Arranged: N/A DME Agency: NA       HH Arranged: NA          Prior Living Arrangements/Services Living arrangements for the past 2 months: Single Family Home Lives with:: Self Patient language and need for interpreter reviewed:: Yes Do you feel safe going back to the place where you live?: Yes      Need for Family Participation in Patient Care: Yes (Comment) Care giver support system in place?: Yes (comment) Current home services: DME (rollator, bsc, walker) Criminal Activity/Legal Involvement Pertinent to Current Situation/Hospitalization: No - Comment as needed  Activities of Daily Living Home Assistive Devices/Equipment: None ADL Screening (condition at time of admission) Patient's cognitive  ability adequate to safely complete daily activities?: No Is the patient deaf or have difficulty hearing?: Yes Does the patient have difficulty seeing, even when wearing glasses/contacts?: Yes (Right eye impairment, right eye double vision) Does the patient have difficulty concentrating, remembering, or making decisions?: No Patient able to express need for assistance with ADLs?: Yes Does the patient have difficulty dressing or bathing?: Yes Independently performs ADLs?: No Communication: Independent Dressing (OT): Needs assistance Is this a change from baseline?: Change from baseline, expected to last <3days Grooming: Needs assistance Is this a change from baseline?: Change from baseline, expected to last <3 days Feeding: Independent Bathing: Needs assistance Is this a change from baseline?: Change from baseline, expected to last <3 days Toileting: Needs assistance Is this a change from baseline?: Change from baseline, expected to last <3 days In/Out Bed: Needs assistance Is this a change from baseline?: Change from baseline, expected to last <3 days Walks in Home: Independent Does the patient have difficulty walking or climbing stairs?: No Weakness of Legs: Both Weakness of Arms/Hands: None  Permission Sought/Granted Permission sought to share information with : Case Manager Permission granted to share information with : Yes, Verbal Permission Granted              Emotional Assessment   Attitude/Demeanor/Rapport: Engaged Affect (typically observed): Appropriate Orientation: : Oriented to Self, Oriented to Place, Oriented to  Time, Oriented to Situation Alcohol / Substance Use: Not Applicable Psych Involvement: No (comment)  Admission diagnosis:  Syncope and collapse [R55] Weakness [R53.1] GI bleed [K92.2] Gastrointestinal hemorrhage with hematemesis [K92.0] Patient Active Problem List   Diagnosis Date Noted   Bleeding on Coumadin 01/25/2023   Gastric polyp 01/24/2023    GI bleed 01/22/2023   Epiretinal membrane (ERM) of right eye 04/16/2021   Cystoid macular edema of right eye 10/16/2019   Diplopia 10/16/2019   Retinal detachment of right eye with single break 10/16/2019   Pre-operative clearance 02/08/2018   Claudication in peripheral vascular disease (HCC) 01/14/2018   Femoral bruit 01/14/2018   Fatigue 01/14/2018   Elevated TSH    PAF (paroxysmal atrial fibrillation) (HCC)    H/O mitral valve repair 09/09/2017   Vitamin D deficiency 09/09/2017   Prostate cancer (HCC)    Hyperlipidemia    Hiatal hernia    GERD (gastroesophageal reflux disease)    DJD (degenerative joint disease)    Deep vein thrombophlebitis of leg (HCC)    Complication of anesthesia    Carotid stenosis    Anxiety    Shingles 09/09/2015   History of DVT (deep vein thrombosis) 09/21/2014   History of excision of Zenker's diverticulum 10/04/2013   Zenker's diverticulum 09/12/2012   Dysphagia 03/14/2012   Bronchitis, acute 10/30/2011   Colon polyps 09/07/2011   Tubular adenoma of colon 05/07/2011   Hx of CABG 03/17/2011   Panic anxiety syndrome 03/10/2011   Long term (current) use of anticoagulants 08/31/2010   Mitral valve disorder 12/10/2009   History of pulmonary embolism 04/25/2009   DEEP VENOUS THROMBOPHLEBITIS 04/25/2009   HEMORRHOIDS 04/25/2009   GASTROESOPHAGEAL REFLUX DISEASE 04/25/2009   SEBORRHEIC DERMATITIS 04/25/2009   SEBACEOUS CYST, NECK 04/25/2009   Osteoarthritis 04/25/2009   Orthostatic hypotension 04/24/2009   NECK PAIN 04/24/2009   DIZZINESS 04/24/2009   Essential hypertension 01/10/2009   MITRAL VALVE REPLACEMENT, HX OF 01/10/2009   ATRIAL FIBRILLATION 10/25/2008   PCP:  Merri Brunette, MD Pharmacy:   [facility]) - [place], [place] - [address] [facility] [address] [place] [address] [place] [address] Phone: [phone] Fax: [phone]  [phone] [place], [place] - [address] [place] [address] Phone: [phone] Fax: [phone]  [facility] - [place], [place] - [address] [place] [address] Phone: [phone] Fax: [phone]     Social Determinants of Health (SDOH) Social History: SDOH Screenings   Food Insecurity: No Food Insecurity (01/23/2023)  Housing: Low Risk  (01/23/2023)  Transportation Needs: No Transportation Needs (01/23/2023)  Utilities: Not At Risk (01/23/2023)  Tobacco Use: Medium Risk (01/24/2023)   SDOH Interventions:     Readmission Risk Interventions     No data to display

## 2023-01-25 NOTE — Progress Notes (Addendum)
PROGRESS NOTE    NYAIR DEPAULO  UYQ:[id number] DOB: 1936-06-21 DOA: 01/22/2023 PCP: Merri Brunette, MD   Brief Narrative: John [name] is a 87 y.o. male with a history of CAD s/p CABG, s/p MVR, atrial fibrillation, PE/DVT on Coumadin, PAD, hyperlipidemia, hypertension, vitamin D deficiency.  Patient presented secondary to bloody stools and syncope with concern for an active GI bleed. GI consulted and performed an EGD up 7/21.   Assessment and Plan:  Acute GI bleeding Presumed secondary to GI bleeding. Patient with dark stools. FOBT positive. BUN elevated. Concern for upper GI source. GI consulted and performed an upper GI endoscopy on 7/21; results and recommendations are still pending. -Continue Protonix drip -GI recommendations: pending -Hold Coumadin. Resume pending GI recommendations  Acute anemia Appears to be acute and related to current acute GI bleeding. Hemoglobin stable. Hemoglobin of 8.3 today.  Leukocytosis Mild. Likely reactive. Resolved.  CAD History of CABG. Patient is on Coumadin, which is held.  Atrial fibrillation Likely paroxysmal as patient is in sinus rhythm. Patient is managed on amiodarone and Coumadin as an outpatient. -Continue amiodarone  Primary hypertension Patient is on losartan and amlodipine as an outpatient. Losartan held secondary to AKI. -Continue amlodipine  AKI on CKD stage IIIa Baseline creatinine of about 1.4-1.5. Creatinine of 1.82 on admission which initially improved with IV fluids. Creatinine now worsened to 1.91. -NS IV fluid bolus -BMP in AM  History of PE/DVT Patient is on Coumadin as an outpatient. Coumadin held secondary to GI bleeding.  Hyperlipidemia -Continue Zetia  Hypothyroidism -Continue Synthroid  DVT prophylaxis: SCDs Code Status:   Code Status: Full Code Family Communication: None at bedside Disposition Plan: Discharge pending GI recommendations/management   Consultants:  [facility]  [facility]  Procedures:  7/22: Upper GI endoscopy  Antimicrobials: None    Subjective: No concern this morning. Hoping to get some food today. No bowel movements.  Objective: BP (!) 151/50 (BP Location: Left Arm)   Pulse 75   Temp 97.8 F (36.6 C) (Oral)   Resp 17   Ht 5\' 9"  (1.753 m)   Wt 73.4 kg   SpO2 99%   BMI 23.90 kg/m   Examination:  General exam: Appears calm and comfortable Respiratory system: Clear to auscultation. Respiratory effort normal. Cardiovascular system: S1 & S2 heard, RRR. Gastrointestinal system: Abdomen is nondistended, soft and nontender. Normal bowel sounds heard. Central nervous system: Alert and oriented. No focal neurological deficits. Musculoskeletal: No edema. No calf tenderness Skin: No cyanosis. No rashes Psychiatry: Judgement and insight appear normal. Mood & affect appropriate.    Data Reviewed: I have personally reviewed following labs and imaging studies  CBC Lab Results  Component Value Date   WBC 7.7 01/25/2023   RBC 2.85 (L) 01/25/2023   HGB 8.3 (L) 01/25/2023   HCT 25.6 (L) 01/25/2023   MCV 89.8 01/25/2023   MCH 29.1 01/25/2023   PLT 207 01/25/2023   MCHC 32.4 01/25/2023   RDW 15.0 01/25/2023   LYMPHSABS 0.8 01/22/2023   MONOABS 0.6 01/22/2023   EOSABS 0.0 01/22/2023   BASOSABS 0.0 01/22/2023     Last metabolic panel Lab Results  Component Value Date   NA 137 01/25/2023   K 4.5 01/25/2023   CL 106 01/25/2023   CO2 24 01/25/2023   BUN 36 (H) 01/25/2023   CREATININE 1.91 (H) 01/25/2023   GLUCOSE 122 (H) 01/25/2023   GFRNONAA 34 (L) 01/25/2023   GFRAA 52 (L) 01/09/2020   CALCIUM 8.0 (L) 01/25/2023  PROT 5.3 (L) 01/23/2023   ALBUMIN 3.0 (L) 01/23/2023   LABGLOB 2.3 04/24/2022   AGRATIO 1.9 04/24/2022   BILITOT 0.4 01/23/2023   ALKPHOS 67 01/23/2023   AST 24 01/23/2023   ALT 22 01/23/2023   ANIONGAP 7 01/25/2023    GFR: Estimated Creatinine Clearance: 28.8 mL/min (A) (by C-G formula based on  SCr of 1.91 mg/dL (H)).  No results found for this or any previous visit (from the past 240 hour(s)).    Radiology Studies: No results found.    LOS: 3 days    Jacquelin Hawking, MD [facility] 01/25/2023, 7:59 AM   If 7PM-7AM, please contact night-coverage [email or web address]

## 2023-01-25 NOTE — Op Note (Signed)
[facility] Patient Name: John [name] Procedure Date : 01/24/2023 MRN: [id number] Attending MD: Napoleon Form , MD, [id number] Date of Birth: [birth year] CSN: [id number] Age: 87 Admit Type: Inpatient Procedure:                Upper GI endoscopy Indications:              Active gastrointestinal bleeding, Suspected upper                            gastrointestinal bleeding, Hematemesis, Melena Providers:                Napoleon Form, MD, Adin Hector, RN, Roselie Awkward, RN, Salley Scarlet, Technician Referring MD:              Medicines:                Monitored Anesthesia Care Complications:            No immediate complications. Estimated Blood Loss:     Estimated blood loss: none. Procedure:                Pre-Anesthesia Assessment:                           - Prior to the procedure, a History and Physical                            was performed, and patient medications and                            allergies were reviewed. The patient's tolerance of                            previous anesthesia was also reviewed. The risks                            and benefits of the procedure and the sedation                            options and risks were discussed with the patient.                            All questions were answered, and informed consent                            was obtained. Prior Anticoagulants: The patient                            last took Coumadin (warfarin) 3 days prior to the                            procedure. ASA Grade Assessment: III - A patient  with severe systemic disease. After reviewing the                            risks and benefits, the patient was deemed in                            satisfactory condition to undergo the procedure.                           After obtaining informed consent, the endoscope was                            passed under direct vision.  Throughout the                            procedure, the patient's blood pressure, pulse, and                            oxygen saturations were monitored continuously. The                            [id number]-[id number] ([id number]) Olympus endoscope was introduced                            through the mouth, and advanced to the second part                            of duodenum. The upper GI endoscopy was                            accomplished without difficulty. The patient                            tolerated the procedure well. Scope In: Scope Out: Findings:      The Z-line was regular and was found 38 cm from the incisors.      The examined esophagus was normal.      A single 14 mm semi-pedunculated polyp with surface ulceration and       stigmata of recent bleeding was found in the cardia. No active bleeding      The exam of the stomach was otherwise normal.      The examined duodenum was normal. Impression:               - Z-line regular, 38 cm from the incisors.                           - Normal esophagus.                           - A single gastric cardia polyp, likely                            hyperplastic/inflammatory polyp with stigmata of  recent bleeding is the etiology for GI blood loss.                            Did not remove the polyp due to elevated INR 2.1,                            at risk for significant bleed with polypectomy                           - Normal examined duodenum.                           - No specimens collected. Recommendation:           - Mechanical soft diet.                           - Continue present medications.                           - Monitor daily INR                           - Continue to hold coumadin, ok to start heparin                            gtt if indicated for bridge                           - Repeat upper endoscopy in 2 days, will                            tentatively plan for Tuesday 01/26/23 for                             polypectomy with cautery.                           - Monitor Hgb and transfuse as needed                           - Pantoprazole 40mg  BID                           - Carafate suspension 1 gm before meals and at                            bedtime X 1 week Procedure Code(s):        --- Professional ---                           [phone], Esophagogastroduodenoscopy, flexible,                            transoral; diagnostic, including collection of  specimen(s) by brushing or washing, when performed                            (separate procedure) Diagnosis Code(s):        --- Professional ---                           K31.7, Polyp of stomach and duodenum                           K92.2, Gastrointestinal hemorrhage, unspecified                           K92.0, Hematemesis                           K92.1, Melena (includes Hematochezia) CPT copyright 2022 [employer]. All rights reserved. The codes documented in this report are preliminary and upon coder review may  be revised to meet current compliance requirements. Napoleon Form, MD 01/24/2023 8:17:26 AM This report has been signed electronically. Number of Addenda: 0

## 2023-01-25 NOTE — Plan of Care (Signed)

## 2023-01-25 NOTE — Care Management Important Message (Signed)
Important Message  Patient Details  Name: John [name] MRN: [id number] Date of Birth: 03-29-1936   Medicare Important Message Given:  Yes     Dorena Bodo 01/25/2023, 3:24 PM

## 2023-01-25 NOTE — Evaluation (Signed)
Physical Therapy Evaluation Patient Details Name: John [name] MRN: [id number] DOB: 1935-09-25 Today's Date: 01/25/2023  History of Present Illness  Pt is an 87 y/o M admitted on 01/22/23 after presenting with c/o bloody stools & syncope with concern for active GI bleed. Pt is s/p upper GI endoscopy on 7/21 & found to have friable gastric polyp. PMH: CAD s/p CABG, s/p MVR, a-fib, PE/DVT on coumadin, PAD, HLD, HTN, vitamin D deficiency  Clinical Impression  Pt seen for PT evaluation with pt agreeable. Pt reports prior to admission he was living alone in a 1 level home with 2-3 steps without rails to enter, independent without AD, still driving. On this date, pt is able to ambulate increased distances without AD but with min assist fade to CGA 2/2 decreased balance. Pt reports he just feels "stiff" 2/2 decreased mobility since admission. Offered for pt to trial RW for increased balance during gait but pt politely declines at this time, noting he knows how to use RW as he's used one in the past. Will continue to follow pt acutely to address balance & gait with LRAD, as well as stair negotiation. Pt reports his daughters plan to provide 24 hr supervision/assist at d/c.        Assistance Recommended at Discharge Set up Supervision/Assistance  If plan is discharge home, recommend the following:  Can travel by private vehicle  Assistance with cooking/housework;Help with stairs or ramp for entrance        Equipment Recommendations None recommended by PT (pt has RW at home)  Recommendations for Other Services       Functional Status Assessment Patient has had a recent decline in their functional status and demonstrates the ability to make significant improvements in function in a reasonable and predictable amount of time.     Precautions / Restrictions Precautions Precautions: Fall Restrictions Weight Bearing Restrictions: No      Mobility  Bed Mobility               General bed  mobility comments: not tested, pt received & left sitting in recliner    Transfers Overall transfer level: Independent Equipment used: None               General transfer comment: STS from recliner without assistance/AD    Ambulation/Gait Ambulation/Gait assistance: Min assist, Min guard Gait Distance (Feet): 200 Feet Assistive device: None Gait Pattern/deviations: Decreased step length - right, Decreased step length - left, Decreased stride length, Staggering right Gait velocity: slightly decreased     General Gait Details: Pt initially requiring min assist 2/2 staggering/LOB to R but pt able to progress to ambulating with CGA.  Stairs            Wheelchair Mobility     Tilt Bed    Modified Rankin (Stroke Patients Only)       Balance Overall balance assessment: Needs assistance   Sitting balance-Leahy Scale: Good     Standing balance support: No upper extremity supported, During functional activity Standing balance-Leahy Scale: Fair                               Pertinent Vitals/Pain Pain Assessment Pain Assessment: 0-10 Pain Score: 5  Pain Location: internally, midline/L abdomen 2/2 endoscopy Pain Descriptors / Indicators: Discomfort Pain Intervention(s): Monitored during session    Home Living Family/patient expects to be discharged to:: Private residence Living Arrangements: Alone Available Help at Discharge:  Family;Available 24 hours/day (pt has 3 daughters that plan to provide 24 hr assist/supervision at d/c) Type of Home: House Home Access: Stairs to enter Entrance Stairs-Rails: None Entrance Stairs-Number of Steps: 2-3   Home Layout: One level Home Equipment: Agricultural consultant (2 wheels);Rollator (4 wheels);Cane - single point Additional Comments: Has life alert button    Prior Function Prior Level of Function : Independent/Modified Independent;Driving             Mobility Comments: Pt reports he's independent without  AD, still driving, denies falls in the past 6 months (outside of this syncopal event leading to admission) ADLs Comments: 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     Hand Dominance        Extremity/Trunk Assessment   Upper Extremity Assessment Upper Extremity Assessment: Overall WFL for tasks assessed    Lower Extremity Assessment Lower Extremity Assessment: Generalized weakness       Communication   Communication:  (slightly HOH)  Cognition Arousal/Alertness: Awake/alert Behavior During Therapy: WFL for tasks assessed/performed Overall Cognitive Status: Within Functional Limits for tasks assessed                                          General Comments General comments (skin integrity, edema, etc.): c/o slight SOB after gait, SpO2 98-100% on room air.    Exercises Other Exercises Other Exercises: Pt performed 10x STS from recliner without BUE support with activity focusing on BLE strengthening & endurance training.   Assessment/Plan    PT Assessment Patient needs continued PT services  PT Problem List Cardiopulmonary status limiting activity;Decreased activity tolerance;Decreased balance;Decreased mobility       PT Treatment Interventions DME instruction;Therapeutic exercise;Gait training;Balance training;Stair training;Neuromuscular re-education;Functional mobility training;Patient/family education;Therapeutic activities    PT Goals (Current goals can be found in the Care Plan section)  Acute Rehab PT Goals Patient Stated Goal: get better PT Goal Formulation: With patient Time For Goal Achievement: 02/08/23 Potential to Achieve Goals: Good    Frequency Min 1X/week     Co-evaluation               AM-PAC PT "6 Clicks" Mobility  Outcome Measure Help needed turning from your back to your side while in a flat bed without using bedrails?: None Help needed moving from lying on your back to sitting on the side of a flat bed without using bedrails?:  None Help needed moving to and from a bed to a chair (including a wheelchair)?: None Help needed standing up from a chair using your arms (e.g., wheelchair or bedside chair)?: None Help needed to walk in hospital room?: A Little Help needed climbing 3-5 steps with a railing? : A Little 6 Click Score: 22    End of Session   Activity Tolerance: Patient tolerated treatment well Patient left: in chair;with chair alarm set;with call bell/phone within reach Nurse Communication: Mobility status PT Visit Diagnosis: Muscle weakness (generalized) (M62.81);Unsteadiness on feet (R26.81)    Time: 1610-9604 PT Time Calculation (min) (ACUTE ONLY): 18 min   Charges:   PT Evaluation $PT Eval Low Complexity: 1 Low   PT General Charges $$ ACUTE PT VISIT: 1 Visit         Aleda Grana, PT, DPT 01/25/23, 10:57 AM   Sandi Mariscal 01/25/2023, 10:55 AM

## 2023-01-25 NOTE — H&P (View-Only) (Signed)
[facility] ROUNDING NOTE   Subjective: Patient feeling much better compared to how he felt on admission.  No further emesis or bowel movements.  Hungry, would like to eat.  EGD July 21 (Dr. Lavon Paganini) 14 mm friable polyp in gastric cardia, likely inflammatory, with bleeding stigmata; likely source of bleed.  Not removed due to INR 2.1.   Objective: Vital signs in last 24 hours: Temp:  [97.8 F (36.6 C)-98.4 F (36.9 C)] 97.8 F (36.6 C) (07/22 0740) Pulse Rate:  [62-75] 75 (07/22 0740) Resp:  [16-19] 17 (07/22 0740) BP: (115-151)/(42-61) 151/50 (07/22 0740) SpO2:  [97 %-99 %] 99 % (07/22 0740) Weight:  [73.4 kg] 73.4 kg (07/22 0355) Last BM Date : 01/22/23 General: NAD, pleasant Caucasian male Lungs:  CTA b/l, no w/r/r Heart:  RRR, no m/r/g Abdomen:  Soft, NT, ND, +BS Ext:  No c/c/e    Intake/Output from previous day: 07/21 0701 - 07/22 0700 In: 853 [P.O.:480; I.V.:373] Out: 900 [Urine:900] Intake/Output this shift: Total I/O In: 20 [I.V.:20] Out: -    Lab Results: Recent Labs    01/22/23 1535 01/22/23 1549 01/23/23 2300 01/24/23 0946 01/25/23 0111  WBC 13.7*  --   --  7.9 7.7  HGB 11.5*   < > 8.9* 9.2* 8.3*  PLT 288  --   --  217 207  MCV 87.7  --   --  88.3 89.8   < > = values in this interval not displayed.   BMET Recent Labs    01/23/23 0815 01/24/23 0100 01/25/23 0111  NA 137 139 137  K 4.6 4.4 4.5  CL 108 107 106  CO2 22 23 24   GLUCOSE 97 88 122*  BUN 51* 35* 36*  CREATININE 1.53* 1.52* 1.91*  CALCIUM 8.2* 8.0* 8.0*   LFT Recent Labs    01/22/23 1535 01/23/23 0815  PROT 6.2* 5.3*  ALBUMIN 3.5 3.0*  AST 28 24  ALT 28 22  ALKPHOS 87 67  BILITOT 0.4 0.4   PT/INR Recent Labs    01/24/23 0100 01/25/23 0111  INR 2.1* 1.8*      Imaging/Other results: No results found.    Assessment and Plan:  87 year old male with hx of CAD s/p CABG, s/p MVR, a-fib, hx PE/DVT on coumadin, PAD, admitted with hematemesis,  melena and weakness, found to have hgb 9.6 down from baseline 11-12. EGD July 21 notable for friable gastric polyp in cardia with bleeding stigmata, not removed due to elevated INR.  This is the suspected bleeding source  Upper GI bleed secondary to friable gastric polyp. - Tentatively planning for EGD tomorrow morning at 0730 with resection of gastric polyp - Goal INR 1.5 - Continue to hold coumadin - Continue IV PPI BID for now - Ok for regular diet today - NPO after midnight for EGD tomorrow - Repeat INR tomorrow am   Jenel Lucks, MD  01/25/2023, 8:36 AM [facility]

## 2023-01-25 NOTE — Progress Notes (Signed)
[facility] ROUNDING NOTE   Subjective: Patient feeling much better compared to how he felt on admission.  No further emesis or bowel movements.  Hungry, would like to eat.  EGD July 21 (Dr. Lavon Paganini) 14 mm friable polyp in gastric cardia, likely inflammatory, with bleeding stigmata; likely source of bleed.  Not removed due to INR 2.1.   Objective: Vital signs in last 24 hours: Temp:  [97.8 F (36.6 C)-98.4 F (36.9 C)] 97.8 F (36.6 C) (07/22 0740) Pulse Rate:  [62-75] 75 (07/22 0740) Resp:  [16-19] 17 (07/22 0740) BP: (115-151)/(42-61) 151/50 (07/22 0740) SpO2:  [97 %-99 %] 99 % (07/22 0740) Weight:  [73.4 kg] 73.4 kg (07/22 0355) Last BM Date : 01/22/23 General: NAD, pleasant Caucasian male Lungs:  CTA b/l, no w/r/r Heart:  RRR, no m/r/g Abdomen:  Soft, NT, ND, +BS Ext:  No c/c/e    Intake/Output from previous day: 07/21 0701 - 07/22 0700 In: 853 [P.O.:480; I.V.:373] Out: 900 [Urine:900] Intake/Output this shift: Total I/O In: 20 [I.V.:20] Out: -    Lab Results: Recent Labs    01/22/23 1535 01/22/23 1549 01/23/23 2300 01/24/23 0946 01/25/23 0111  WBC 13.7*  --   --  7.9 7.7  HGB 11.5*   < > 8.9* 9.2* 8.3*  PLT 288  --   --  217 207  MCV 87.7  --   --  88.3 89.8   < > = values in this interval not displayed.   BMET Recent Labs    01/23/23 0815 01/24/23 0100 01/25/23 0111  NA 137 139 137  K 4.6 4.4 4.5  CL 108 107 106  CO2 22 23 24   GLUCOSE 97 88 122*  BUN 51* 35* 36*  CREATININE 1.53* 1.52* 1.91*  CALCIUM 8.2* 8.0* 8.0*   LFT Recent Labs    01/22/23 1535 01/23/23 0815  PROT 6.2* 5.3*  ALBUMIN 3.5 3.0*  AST 28 24  ALT 28 22  ALKPHOS 87 67  BILITOT 0.4 0.4   PT/INR Recent Labs    01/24/23 0100 01/25/23 0111  INR 2.1* 1.8*      Imaging/Other results: No results found.    Assessment and Plan:  87 year old male with hx of CAD s/p CABG, s/p MVR, a-fib, hx PE/DVT on coumadin, PAD, admitted with hematemesis,  melena and weakness, found to have hgb 9.6 down from baseline 11-12. EGD July 21 notable for friable gastric polyp in cardia with bleeding stigmata, not removed due to elevated INR.  This is the suspected bleeding source  Upper GI bleed secondary to friable gastric polyp. - Tentatively planning for EGD tomorrow morning at 0730 with resection of gastric polyp - Goal INR 1.5 - Continue to hold coumadin - Continue IV PPI BID for now - Ok for regular diet today - NPO after midnight for EGD tomorrow - Repeat INR tomorrow am   Jenel Lucks, MD  01/25/2023, 8:36 AM [facility]

## 2023-01-26 ENCOUNTER — Inpatient Hospital Stay (HOSPITAL_COMMUNITY): Payer: Medicare Other

## 2023-01-26 ENCOUNTER — Inpatient Hospital Stay (HOSPITAL_COMMUNITY): Payer: Medicare Other | Admitting: Certified Registered Nurse Anesthetist

## 2023-01-26 ENCOUNTER — Encounter (HOSPITAL_COMMUNITY): Payer: Self-pay | Admitting: Internal Medicine

## 2023-01-26 ENCOUNTER — Encounter (HOSPITAL_COMMUNITY)
Admission: EM | Disposition: A | Discharge status: Home / Self Care | Payer: Self-pay | Source: Home / Self Care | Attending: Family Medicine

## 2023-01-26 DIAGNOSIS — R58 Hemorrhage, not elsewhere classified: Secondary | ICD-10-CM

## 2023-01-26 DIAGNOSIS — K922 Gastrointestinal hemorrhage, unspecified: Secondary | ICD-10-CM | POA: Diagnosis not present

## 2023-01-26 DIAGNOSIS — I10 Essential (primary) hypertension: Secondary | ICD-10-CM

## 2023-01-26 DIAGNOSIS — Z87891 Personal history of nicotine dependence: Secondary | ICD-10-CM

## 2023-01-26 DIAGNOSIS — K920 Hematemesis: Secondary | ICD-10-CM | POA: Diagnosis not present

## 2023-01-26 DIAGNOSIS — T45515A Adverse effect of anticoagulants, initial encounter: Secondary | ICD-10-CM

## 2023-01-26 DIAGNOSIS — K449 Diaphragmatic hernia without obstruction or gangrene: Secondary | ICD-10-CM | POA: Diagnosis not present

## 2023-01-26 DIAGNOSIS — K317 Polyp of stomach and duodenum: Principal | ICD-10-CM

## 2023-01-26 DIAGNOSIS — I2510 Atherosclerotic heart disease of native coronary artery without angina pectoris: Secondary | ICD-10-CM | POA: Diagnosis not present

## 2023-01-26 DIAGNOSIS — I4891 Unspecified atrial fibrillation: Secondary | ICD-10-CM | POA: Diagnosis not present

## 2023-01-26 HISTORY — PX: SCLEROTHERAPY: SHX6841

## 2023-01-26 HISTORY — PX: POLYPECTOMY: SHX5525

## 2023-01-26 HISTORY — PX: ESOPHAGOGASTRODUODENOSCOPY (EGD) WITH PROPOFOL: SHX5813

## 2023-01-26 LAB — BASIC METABOLIC PANEL
Anion gap: 7 (ref 5–15)
BUN: 25 mg/dL — ABNORMAL HIGH (ref 8–23)
CO2: 24 mmol/L (ref 22–32)
Calcium: 8 mg/dL — ABNORMAL LOW (ref 8.9–10.3)
Chloride: 107 mmol/L (ref 98–111)
Creatinine, Ser: 1.56 mg/dL — ABNORMAL HIGH (ref 0.61–1.24)
GFR, Estimated: 44 mL/min — ABNORMAL LOW (ref >60)
Glucose, Bld: 119 mg/dL — ABNORMAL HIGH (ref 70–99)
Potassium: 3.9 mmol/L (ref 3.5–5.1)
Sodium: 138 mmol/L (ref 135–145)

## 2023-01-26 LAB — PROTIME-INR
INR: 1.4 — ABNORMAL HIGH (ref 0.8–1.2)
Prothrombin Time: 17.4 seconds — ABNORMAL HIGH (ref 11.4–15.2)

## 2023-01-26 LAB — CBC
HCT: 24.4 % — ABNORMAL LOW (ref 39.0–52.0)
Hemoglobin: 7.9 g/dL — ABNORMAL LOW (ref 13.0–17.0)
MCH: 28.1 pg (ref 26.0–34.0)
MCHC: 32.4 g/dL (ref 30.0–36.0)
MCV: 86.8 fL (ref 80.0–100.0)
Platelets: 218 10*3/uL (ref 150–400)
RBC: 2.81 MIL/uL — ABNORMAL LOW (ref 4.22–5.81)
RDW: 15.1 % (ref 11.5–15.5)
WBC: 8.7 10*3/uL (ref 4.0–10.5)
nRBC: 0 % (ref 0.0–0.2)

## 2023-01-26 SURGERY — ESOPHAGOGASTRODUODENOSCOPY (EGD) WITH PROPOFOL
Anesthesia: Monitor Anesthesia Care

## 2023-01-26 MED ORDER — LACTATED RINGERS IV SOLN: INTRAVENOUS | Status: DC

## 2023-01-26 MED ORDER — POLYETHYLENE GLYCOL 3350 17 G PO PACK
17.0000 g | PACK | Freq: Every day | ORAL | Status: DC
  Administered 2023-01-26 – 2023-01-28 (×3): 17 g via ORAL
  Filled 2023-01-26 (×3): qty 1

## 2023-01-26 MED ORDER — EPINEPHRINE 1 MG/10ML IJ SOSY
PREFILLED_SYRINGE | INTRAMUSCULAR | Status: AC
  Filled 2023-01-26: qty 10

## 2023-01-26 MED ORDER — SODIUM CHLORIDE (PF) 0.9 % IJ SOLN
PREFILLED_SYRINGE | INTRAMUSCULAR | Status: DC | PRN
  Administered 2023-01-26: 2 mL

## 2023-01-26 MED ORDER — PROPOFOL 10 MG/ML IV BOLUS
INTRAVENOUS | Status: DC | PRN
Start: 2023-01-26 — End: 2023-01-26
  Administered 2023-01-26: 60 mg via INTRAVENOUS

## 2023-01-26 MED ORDER — PANTOPRAZOLE SODIUM 40 MG PO TBEC
40.0000 mg | DELAYED_RELEASE_TABLET | Freq: Two times a day (BID) | ORAL | Status: DC
  Administered 2023-01-26 – 2023-01-28 (×5): 40 mg via ORAL
  Filled 2023-01-26 (×5): qty 1

## 2023-01-26 MED ORDER — PROPOFOL 500 MG/50ML IV EMUL
INTRAVENOUS | Status: DC | PRN
  Administered 2023-01-26: 100 ug/kg/min via INTRAVENOUS

## 2023-01-26 MED ORDER — LIDOCAINE 2% (20 MG/ML) 5 ML SYRINGE
INTRAMUSCULAR | Status: DC | PRN
  Administered 2023-01-26: 35 mg via INTRAVENOUS

## 2023-01-26 MED ORDER — PHENYLEPHRINE HCL-NACL 20-0.9 MG/250ML-% IV SOLN
INTRAVENOUS | Status: DC | PRN
  Administered 2023-01-26: 50 ug/min via INTRAVENOUS

## 2023-01-26 SURGICAL SUPPLY — 15 items

## 2023-01-26 NOTE — Plan of Care (Signed)

## 2023-01-26 NOTE — Anesthesia Procedure Notes (Signed)
Procedure Name: MAC Date/Time: 01/26/2023 8:38 AM  Performed by: Cy Blamer, CRNAPre-anesthesia Checklist: Patient identified, Emergency Drugs available, Suction available, Patient being monitored and Timeout performed Oxygen Delivery Method: Nasal cannula Placement Confirmation: positive ETCO2 Dental Injury: Teeth and Oropharynx as per pre-operative assessment

## 2023-01-26 NOTE — Op Note (Signed)
John [name] Patient Name: John [name] Procedure Date : 01/26/2023 MRN: [id number] Attending MD: John Amis. [name] , MD, [id number] Date of Birth: [birth year] CSN: [id number] Age: 87 Admit Type: Inpatient Procedure:                Upper GI endoscopy Indications:              Therapeutic procedure (removal of friable cardia                            polyp), Recent gastrointestinal bleeding                            (attributed to polyp) Providers:                John Amis. Tomasa Rand, MD, John Ivory, RN, John [name], Technician Referring MD:              Medicines:                Monitored Anesthesia Care Complications:            No immediate complications. Estimated Blood Loss:     Estimated blood loss was minimal. Procedure:                Pre-Anesthesia Assessment:                           - Prior to the procedure, a History and Physical                            was performed, and patient medications and                            allergies were reviewed. The patient's tolerance of                            previous anesthesia was also reviewed. The risks                            and benefits of the procedure and the sedation                            options and risks were discussed with the patient.                            All questions were answered, and informed consent                            was obtained. Prior Anticoagulants: The patient has                            taken Coumadin (warfarin), last dose was 5 days  prior to procedure. ASA Grade Assessment: III - A                            patient with severe systemic disease. After                            reviewing the risks and benefits, the patient was                            deemed in satisfactory condition to undergo the                            procedure.                           After obtaining informed consent, the  endoscope was                            passed under direct vision. Throughout the                            procedure, the patient's blood pressure, pulse, and                            oxygen saturations were monitored continuously. The                            GIF-H190 ([id number] Olympus endoscope was introduced                            through the mouth, and advanced to the second part                            of duodenum. The upper GI endoscopy was                            accomplished without difficulty. The patient                            tolerated the procedure well. Scope In: Scope Out: Findings:      The examined esophagus was normal.      A 5 cm hiatal hernia was present.      A single 14 mm pedunculated polyp with no bleeding and stigmata of       recent bleeding was found in the cardia. The base of the polyp was       successfully injected with 2 mL of a 0.1 mg/mL solution of epinephrine       for hemostasis. The polyp was then removed with a hot snare. Resection       and retrieval were complete. Estimated blood loss was minimal. The       polypectomy site defect was quite small and no hemoclip closure was       indicated.      The exam of the stomach was otherwise normal.      The examined duodenum was  normal. Impression:               - Normal esophagus.                           - 5 cm hiatal hernia.                           - A single gastric polyp. Resected and retrieved.                            Injected.                           - Normal examined duodenum. Moderate Sedation:      N/A Recommendation:           - Return patient to hospital ward for possible                            discharge same day.                           - Resume regular diet.                           - Resume Coumadin (warfarin) at prior dose tomorrow.                           - Await pathology results.                           - Repeat upper endoscopy after studies  are complete                            for surveillance (only if polyp is precancerous                            would surveillance be indicated).                           - Use Protonix (pantoprazole) 40 mg PO BID for 8                            weeks.                           - Use carafate suspension 1 gram QID for 2 weeks                           - GI will sign off at this time. Procedure Code(s):        --- Professional ---                           [phone], Esophagogastroduodenoscopy, flexible,                            transoral; with removal of tumor(s),  polyp(s), or                            other lesion(s) by snare technique Diagnosis Code(s):        --- Professional ---                           K44.9, Diaphragmatic hernia without obstruction or                            gangrene                           K31.7, Polyp of stomach and duodenum                           K92.2, Gastrointestinal hemorrhage, unspecified CPT copyright 2022 [employer] Association. All rights reserved. The codes documented in this report are preliminary and upon coder review may  be revised to meet current compliance requirements. John Franko E. Tomasa Rand, MD 01/26/2023 9:13:00 AM This report has been signed electronically. Number of Addenda: 0

## 2023-01-26 NOTE — Interval H&P Note (Addendum)
History and Physical Interval Note:  01/26/2023 7:18 AM  John [name]  has presented today for surgery, with the diagnosis of GI bleed, hematemesis, melena.  The various methods of treatment have been discussed with the patient and family. After consideration of risks, benefits and other options for treatment, the patient has consented to  Procedure(s): ESOPHAGOGASTRODUODENOSCOPY (EGD) WITH PROPOFOL (N/A) as a surgical intervention.  The patient's history has been reviewed, patient examined, no change in status, stable for surgery.  I have reviewed the patient's chart and labs.  Questions were answered to the patient's satisfaction.    Awaiting INR this morning.     Jimmy Plessinger E Corra Kaine  Addendum: INR 1.4; will proceed with EGD

## 2023-01-26 NOTE — Progress Notes (Addendum)
PROGRESS NOTE    John [name]  NFA:[id number] DOB: 11/21/1935 DOA: 01/22/2023 PCP: Merri Brunette, MD   Brief Narrative: John [name] is a 87 y.o. male with a history of CAD s/p CABG, s/p MVR, atrial fibrillation, PE/DVT on Coumadin, PAD, hyperlipidemia, hypertension, vitamin D deficiency.  Patient presented secondary to bloody stools and syncope with concern for an active GI bleed. GI consulted and performed an EGD on 7/21 with repeat EGD on 7/23.  Awaiting stable hemoglobin prior to discharge.   Assessment and Plan:  Acute GI bleeding Presumed secondary to GI bleeding. Patient with dark stools. FOBT positive. BUN elevated. Concern for upper GI source. GI consulted and performed an upper GI endoscopy on 7/21 which was significant for a single gastric polyp with stigmata of recent bleeding.  Repeat upper GI endoscopy performed on 7/23 and resection of polyp was performed. -Continue Protonix  Acute anemia Appears to be acute and related to current acute GI bleeding. Hemoglobin stable. Hemoglobin of 7.9 today. -CBC in AM  Gastric polyp Noted on upper endoscopy. Resected and sent for pathology on 7/23. GI to follow-up pathology results.  Left flank pain Unclear etiology. Possible rib related. Worsening anemia could indicate possible bleeding etiology. Tenderness is significant but could not find a specific etiology on examination. -Chest/ribs x-ray; if negative, will obtain CT abdomen/pelvis  Leukocytosis Mild. Likely reactive. Resolved.  CAD History of CABG. Patient is on Coumadin, which is held.  Atrial fibrillation Likely paroxysmal as patient is in sinus rhythm. Patient is managed on amiodarone and Coumadin as an outpatient. -Continue amiodarone  Primary hypertension Patient is on losartan and amlodipine as an outpatient. Losartan held secondary to AKI. -Continue amlodipine  AKI on CKD stage IIIa Baseline creatinine of about 1.4-1.5. Creatinine of 1.82 on admission which  initially improved with IV fluids. Creatinine worsened again to 1.91 with improvement to 1.56 with IV fluid bolus. -BMP in AM  History of PE/DVT Patient is on Coumadin as an outpatient. Coumadin held secondary to GI bleeding. GI surgery recommend to restart Coumadin on 7/24  Hyperlipidemia -Continue Zetia  Hypothyroidism -Continue Synthroid  DVT prophylaxis: SCDs Code Status:   Code Status: Full Code Family Communication: None at bedside Disposition Plan: Discharge pending stable hemoglobin, resumption of Coumadin, workup for left flank pain   Consultants:  [facility]  Procedures:  7/22: Upper GI endoscopy  Antimicrobials: None    Subjective: No bowel movement.  Patient reports some left sided flank pain that started at least about 1 day ago but patient thinks possibly since admission.  Pain is worse when he moves and when he presses the area.  He is concerned about rib fracture related to his recent fall.  Objective: BP (!) 141/46 (BP Location: Left Arm)   Pulse 76   Temp 97.6 F (36.4 C) (Oral)   Resp 18   Ht 5\' 9"  (1.753 m)   Wt 73.9 kg   SpO2 100%   BMI 24.06 kg/m   Examination:  General exam: Appears calm and comfortable Respiratory system: Clear to auscultation. Respiratory effort normal. Cardiovascular system: S1 & S2 heard, RRR. No murmurs, rubs, gallops or clicks. Gastrointestinal system: Abdomen is nondistended, soft and tender in left upper/lower quadrants. No organomegaly or masses felt. Normal bowel sounds heard. Central nervous system: Alert and oriented. No focal neurological deficits. Musculoskeletal: Some tenderness over left lower ribs. Skin: No cyanosis. No rashes Psychiatry: Judgement and insight appear normal. Mood & affect appropriate.    Data Reviewed: I  have personally reviewed following labs and imaging studies  CBC Lab Results  Component Value Date   WBC 8.7 01/26/2023   RBC 2.81 (L) 01/26/2023   HGB 7.9 (L)  01/26/2023   HCT 24.4 (L) 01/26/2023   MCV 86.8 01/26/2023   MCH 28.1 01/26/2023   PLT 218 01/26/2023   MCHC 32.4 01/26/2023   RDW 15.1 01/26/2023   LYMPHSABS 0.8 01/22/2023   MONOABS 0.6 01/22/2023   EOSABS 0.0 01/22/2023   BASOSABS 0.0 01/22/2023     Last metabolic panel Lab Results  Component Value Date   NA 138 01/26/2023   K 3.9 01/26/2023   CL 107 01/26/2023   CO2 24 01/26/2023   BUN 25 (H) 01/26/2023   CREATININE 1.56 (H) 01/26/2023   GLUCOSE 119 (H) 01/26/2023   GFRNONAA 44 (L) 01/26/2023   GFRAA 52 (L) 01/09/2020   CALCIUM 8.0 (L) 01/26/2023   PROT 5.3 (L) 01/23/2023   ALBUMIN 3.0 (L) 01/23/2023   LABGLOB 2.3 04/24/2022   AGRATIO 1.9 04/24/2022   BILITOT 0.4 01/23/2023   ALKPHOS 67 01/23/2023   AST 24 01/23/2023   ALT 22 01/23/2023   ANIONGAP 7 01/26/2023    GFR: Estimated Creatinine Clearance: 35.2 mL/min (A) (by C-G formula based on SCr of 1.56 mg/dL (H)).  No results found for this or any previous visit (from the past 240 hour(s)).    Radiology Studies: No results found.    LOS: 4 days    Jacquelin Hawking, MD [facility] 01/26/2023, 1:17 PM   If 7PM-7AM, please contact night-coverage [email or web address]

## 2023-01-26 NOTE — Plan of Care (Signed)
  Problem: Clinical Measurements: Goal: Ability to maintain clinical measurements within normal limits will improve Outcome: Progressing   Problem: Activity: Goal: Risk for activity intolerance will decrease Outcome: Progressing   Problem: Nutrition: Goal: Adequate nutrition will be maintained Outcome: Progressing   Problem: Elimination: Goal: Will not experience complications related to bowel motility Outcome: Progressing   

## 2023-01-26 NOTE — Transfer of Care (Signed)
Immediate Anesthesia Transfer of Care Note  Patient: CHAYNCE SCHAFER  Procedure(s) Performed: ESOPHAGOGASTRODUODENOSCOPY (EGD) WITH PROPOFOL SCLEROTHERAPY POLYPECTOMY  Patient Location: Endoscopy Unit  Anesthesia Type:MAC  Level of Consciousness: drowsy, patient cooperative, and responds to stimulation  Airway & Oxygen Therapy: Patient Spontanous Breathing  Post-op Assessment: Report given to RN, Post -op Vital signs reviewed and stable, Patient moving all extremities X 4, and Patient able to stick tongue midline  Post vital signs: Reviewed and stable  Last Vitals:  Vitals Value Taken Time  BP 153/48   Temp 98.6   Pulse 78   Resp 22   SpO2 95     Last Pain:  Vitals:   01/26/23 0707  TempSrc: Temporal  PainSc: 6       Patients Stated Pain Goal: 0 (01/26/23 0707)  Complications: No notable events documented.

## 2023-01-26 NOTE — Progress Notes (Signed)
Mobility Specialist Progress Note:    01/26/23 1627  Mobility  Activity Ambulated with assistance in hallway  Level of Assistance Contact guard assist, steadying assist  Assistive Device None  Distance Ambulated (ft) 500 ft  Activity Response Tolerated well  Mobility Referral Yes  $Mobility charge 1 Mobility  Mobility Specialist Start Time (ACUTE ONLY) 1400  Mobility Specialist Stop Time (ACUTE ONLY) 1415  Mobility Specialist Time Calculation (min) (ACUTE ONLY) 15 min   Pt received in chair eager to ambulate. Pt needed no physical assistance throughout session. No c/o during ambulation. Pt assisted back to his chair w/ call bell and personal belongings in reach. All needs met.  Thompson Grayer Mobility Specialist  Please contact vis Secure Chat or  Rehab Office [phone]

## 2023-01-26 NOTE — Progress Notes (Signed)
Pt complaining of left flank pain and concerned about possible rib fracture from fall.  Secure chat sent to MD - will follow up with MD during morning rounds.  Pt given PRN medication for pain.

## 2023-01-26 NOTE — Anesthesia Preprocedure Evaluation (Signed)
Anesthesia Evaluation  Patient identified by MRN, date of birth, ID band Patient awake    Reviewed: Allergy & Precautions, NPO status , Patient's Chart, lab work & pertinent test results  History of Anesthesia Complications Negative for: history of anesthetic complications  Airway Mallampati: III  TM Distance: <3 FB Neck ROM: Full    Dental  (+) Dental Advisory Given, Teeth Intact,    Pulmonary neg shortness of breath, neg sleep apnea, neg COPD, neg recent URI, former smoker   breath sounds clear to auscultation       Cardiovascular hypertension, Pt. on medications (-) angina + CAD   Rhythm:Regular   1. Left ventricular ejection fraction, by estimation, is 55 to 60%. The  left ventricle has normal function. The left ventricle has no regional  wall motion abnormalities. There is mild concentric left ventricular  hypertrophy. Left ventricular diastolic  parameters are indeterminate.   2. Right ventricular systolic function is normal. The right ventricular  size is mildly enlarged. There is normal pulmonary artery systolic  pressure.   3. Left atrial size was severely dilated.   4. Well seated mitral 26 mm annular ring (procedure date 2009). mean  gradient 8 mmHg stable from prior.. Trivial mitral valve regurgitation.   5. Aortic valve regurgitation is mild. Aortic valve  sclerosis/calcification is present, without any evidence of aortic  stenosis.   6. The inferior vena cava is normal in size with greater than 50%  respiratory variability, suggesting right atrial pressure of 3 mmHg.      The study is normal. The study is low risk.   No ST deviation was noted.   Left ventricular function is normal. Nuclear stress EF: 63 %. The left ventricular ejection fraction is normal (55-65%). End diastolic cavity size is normal.   Prior study available for comparison from 07/12/2008.   Findings: No evidence of ischemia or infarction.        Neuro/Psych  PSYCHIATRIC DISORDERS Anxiety      Neuromuscular disease    GI/Hepatic Neg liver ROS, hiatal hernia,GERD  ,,  Endo/Other  Hypothyroidism    Renal/GU negative Renal ROS     Musculoskeletal  (+) Arthritis ,    Abdominal   Peds  Hematology  (+) Blood dyscrasia, anemia Lab Results      Component                Value               Date                      WBC                      8.7                 01/26/2023                HGB                      7.9 (L)             01/26/2023                HCT                      24.4 (L)            01/26/2023  MCV                      86.8                01/26/2023                PLT                      218                 01/26/2023            Lab Results      Component                Value               Date                      INR                      1.4 (H)             01/26/2023                INR                      1.8 (H)             01/25/2023                INR                      2.1 (H)             01/24/2023                PROTIME                  18.2                12/11/2008              Anesthesia Other Findings   Reproductive/Obstetrics                             Anesthesia Physical Anesthesia Plan  ASA: 3  Anesthesia Plan: MAC   Post-op Pain Management: Minimal or no pain anticipated   Induction: Intravenous  PONV Risk Score and Plan: 1 and Treatment may vary due to age or medical condition and Propofol infusion  Airway Management Planned: Simple Face Mask, Natural Airway and Nasal Cannula  Additional Equipment: None  Intra-op Plan:   Post-operative Plan:   Informed Consent: I have reviewed the patients History and Physical, chart, labs and discussed the procedure including the risks, benefits and alternatives for the proposed anesthesia with the patient or authorized representative who has indicated his/her understanding and acceptance.      Dental advisory given  Plan Discussed with: CRNA  Anesthesia Plan Comments:        Anesthesia Quick Evaluation

## 2023-01-27 DIAGNOSIS — K922 Gastrointestinal hemorrhage, unspecified: Secondary | ICD-10-CM | POA: Diagnosis not present

## 2023-01-27 DIAGNOSIS — K317 Polyp of stomach and duodenum: Secondary | ICD-10-CM | POA: Diagnosis not present

## 2023-01-27 LAB — CBC
HCT: 23.1 % — ABNORMAL LOW (ref 39.0–52.0)
Hemoglobin: 7.5 g/dL — ABNORMAL LOW (ref 13.0–17.0)
MCH: 28.2 pg (ref 26.0–34.0)
MCHC: 32.5 g/dL (ref 30.0–36.0)
MCV: 86.8 fL (ref 80.0–100.0)
Platelets: 246 10*3/uL (ref 150–400)
RBC: 2.66 MIL/uL — ABNORMAL LOW (ref 4.22–5.81)
RDW: 15.2 % (ref 11.5–15.5)
WBC: 7.7 10*3/uL (ref 4.0–10.5)
nRBC: 0 % (ref 0.0–0.2)

## 2023-01-27 MED ORDER — WARFARIN SODIUM 3 MG PO TABS
3.0000 mg | ORAL_TABLET | Freq: Once | ORAL | Status: AC
  Administered 2023-01-27: 3 mg via ORAL
  Filled 2023-01-27: qty 1

## 2023-01-27 MED ORDER — SODIUM CHLORIDE 0.9 % IV SOLN
200.0000 mg | Freq: Every day | INTRAVENOUS | Status: DC
  Administered 2023-01-27 – 2023-01-28 (×2): 200 mg via INTRAVENOUS
  Filled 2023-01-27 (×2): qty 10

## 2023-01-27 MED ORDER — WARFARIN - PHARMACIST DOSING INPATIENT: Freq: Every day | Status: DC

## 2023-01-27 NOTE — Consult Note (Signed)
   [facility] Inpatient Consult   01/27/2023  NEFTALI ABAIR Jun 01, 1936 [id number]  [facility] [[facility]]  Accountable Care Organization [ACO] Patient: Medicare ACO REACH  Primary Care Provider:  Merri Brunette, MD with [facility]   Patient screened for hospitalization with noted medium but rising risk score for unplanned readmission risk 4 day length of stay and to assess for potential [facility]  [[facility]] Care Management service needs for post hospital transition for care coordination.  Review of patient's electronic medical record reveals patient is from home alone but has supportive family per unit progression meeting this morning.  Met with patient and daughter [from [place]] regarding post hospital follow up with a RN Care Coordinator on behalf of [facility].  Patient states he is very familiar with [facility] in the care of his [deceased] wife. Explained any ongoing needs.  States that they are waiting for the attending MD to follow up about results of CT and Xrays. Spoke with RN, Rubin Payor, who states she has reached out to attending as well.  Encouraged patient and family to using "My Chart" in [facility] EPIC as well.  Plan:  Continue to follow progress and disposition to assess for post hospital community care coordination/management needs.  Referral request for community care coordination: will plan to have RN Care Coordinator to follow up  Of note, [facility] Care Management/Population Health does not replace or interfere with any arrangements made by the Inpatient Transition of Care team.  For questions contact:   Charlesetta Shanks, RN BSN CCM [facility]  [phone] business mobile phone Toll free office [phone]  *Concierge Line  [phone] Fax number: [phone] [email or web address]

## 2023-01-27 NOTE — Plan of Care (Signed)
  Problem: Education: Goal: Knowledge of General Education information will improve Description: Including pain rating scale, medication(s)/side effects and non-pharmacologic comfort measures Outcome: Progressing   Problem: Health Behavior/Discharge Planning: Goal: Ability to manage health-related needs will improve Outcome: Progressing   Problem: Activity: Goal: Risk for activity intolerance will decrease Outcome: Progressing   

## 2023-01-27 NOTE — Progress Notes (Signed)
PROGRESS NOTE    John [name]  XBJ:[id number] DOB: 02-24-1936 DOA: 01/22/2023 PCP: Merri Brunette, MD   Brief Narrative: No notes on file   Assessment and Plan:  87 year old male with history of CAD s/p CABG, mitral valve, atrial fibrillation on Coumadin, PAD, hyperlipidemia, hypothyroidism, hypertension, vitamin D deficiency, anxiety/depression, cleaning thousand 7 chronic anticoagulation presents to the emergency department with complaint of 3-4 episodes of bloody stools then episode of hematemesis associated with epigastric pain.  Patient also had an episode of syncope after attempting to get off of commode.  GI is consulted, underwent endoscopy 7/21, found to have single gastric polyp with stigmata of recent bleeding.  Repeat endoscopy done on 01/29/2023, resection of the polyp was performed, continue with the Protonix.  Upper GI bleed -Secondary to gastric polyp -Repeat imaging performed on  7/23, resection of the polyp -Continue the Protonix 40 mg IV twice daily  Acute blood loss anemia -Hemoglobin trended down to 7.9-7.5 -Patient has baseline hemoglobin of 11.5 -Continues to have mild trend down. -Keep the patient on IV iron -S/p C-section, sent to pathology   Left flank pain -Musculoskeletal pain, reproducible pain from fall  Leukocytosis Mild. Likely reactive. Resolved.  CAD History of CABG. Patient is on Coumadin, which is held.  Atrial fibrillation Likely paroxysmal as patient is in sinus rhythm. Patient is managed on amiodarone and Coumadin as an outpatient. -Continue amiodarone  Primary hypertension Patient is on losartan and amlodipine as an outpatient. Losartan held secondary to AKI. -Continue amlodipine  AKI on CKD stage IIIa Baseline creatinine of about 1.4-1.5. Creatinine of 1.82 on admission which initially improved with IV fluids. Creatinine worsened again to 1.91 with improvement to 1.56 with IV fluid bolus. -BMP in AM  History of PE/DVT -Had  DVT PE 45 years back, since then has been on Coumadin -Started back on Coumadin cleared by GI  Hyperlipidemia -Continue Zetia  Hypothyroidism -Continue Synthroid  DVT prophylaxis: SCDs Code Status:   Code Status: Full Code Family Communication: None at bedside Disposition Plan: Discharge pending stable hemoglobin, resumption of Coumadin, workup for left flank pain   Consultants:  [place] [facility]  Procedures:  7/22: Upper GI endoscopy  Antimicrobials: None    Subjective: No bowel movement.  Patient reports some left sided flank pain that started at least about 1 day ago but patient thinks possibly since admission.  Pain is worse when he moves and when he presses the area.  He is concerned about rib fracture related to his recent fall.  Objective: BP (!) 157/54 (BP Location: Right Arm)   Pulse 81   Temp 97.9 F (36.6 C) (Oral)   Resp 18   Ht 5\' 9"  (1.753 m)   Wt 74 kg   SpO2 99%   BMI 24.09 kg/m   Examination:  General exam: Appears calm and comfortable Respiratory system: Clear to auscultation. Respiratory effort normal. Cardiovascular system: S1 & S2 heard, RRR. No murmurs, rubs, gallops or clicks. Gastrointestinal system: Abdomen is nondistended, soft and tender in left upper/lower quadrants. No organomegaly or masses felt. Normal bowel sounds heard. Central nervous system: Alert and oriented. No focal neurological deficits. Musculoskeletal: Some tenderness over left lower ribs. Skin: No cyanosis. No rashes Psychiatry: Judgement and insight appear normal. Mood & affect appropriate.    Data Reviewed: I have personally reviewed following labs and imaging studies  CBC Lab Results  Component Value Date   WBC 7.7 01/27/2023   RBC 2.66 (L) 01/27/2023   HGB 7.5 (L) 01/27/2023  HCT 23.1 (L) 01/27/2023   MCV 86.8 01/27/2023   MCH 28.2 01/27/2023   PLT 246 01/27/2023   MCHC 32.5 01/27/2023   RDW 15.2 01/27/2023   LYMPHSABS 0.8 01/22/2023   MONOABS  0.6 01/22/2023   EOSABS 0.0 01/22/2023   BASOSABS 0.0 01/22/2023     Last metabolic panel Lab Results  Component Value Date   NA 138 01/26/2023   K 3.9 01/26/2023   CL 107 01/26/2023   CO2 24 01/26/2023   BUN 25 (H) 01/26/2023   CREATININE 1.56 (H) 01/26/2023   GLUCOSE 119 (H) 01/26/2023   GFRNONAA 44 (L) 01/26/2023   GFRAA 52 (L) 01/09/2020   CALCIUM 8.0 (L) 01/26/2023   PROT 5.3 (L) 01/23/2023   ALBUMIN 3.0 (L) 01/23/2023   LABGLOB 2.3 04/24/2022   AGRATIO 1.9 04/24/2022   BILITOT 0.4 01/23/2023   ALKPHOS 67 01/23/2023   AST 24 01/23/2023   ALT 22 01/23/2023   ANIONGAP 7 01/26/2023    GFR: Estimated Creatinine Clearance: 35.2 mL/min (A) (by C-G formula based on SCr of 1.56 mg/dL (H)).  No results found for this or any previous visit (from the past 240 hour(s)).    Radiology Studies: CT ABDOMEN PELVIS WO CONTRAST  Result Date: 01/26/2023 CLINICAL DATA:  Acute abdominal pain following fall, initial encounter EXAM: CT ABDOMEN AND PELVIS WITHOUT CONTRAST TECHNIQUE: Multidetector CT imaging of the abdomen and pelvis was performed following the standard protocol without IV contrast. RADIATION DOSE REDUCTION: This exam was performed according to the departmental dose-optimization program which includes automated exposure control, adjustment of the mA and/or kV according to patient size and/or use of iterative reconstruction technique. COMPARISON:  None Available. FINDINGS: Lower chest: Minimal left effusion is noted. No significant atelectatic changes are seen. Hepatobiliary: No focal liver abnormality is seen. No gallstones, gallbladder wall thickening, or biliary dilatation. Pancreas: Unremarkable. No pancreatic ductal dilatation or surrounding inflammatory changes. Spleen: Normal in size without focal abnormality. Adrenals/Urinary Tract: Adrenal glands are within normal limits. The kidneys demonstrate no renal calculi or obstructive changes. The bladder is decompressed.  Stomach/Bowel: Scattered diverticular change of the colon is noted without evidence of diverticulitis. No obstructive changes of the colon are seen. The appendix is within normal limits. Small bowel and stomach are unremarkable. Vascular/Lymphatic: Aortic atherosclerosis. No enlarged abdominal or pelvic lymph nodes. Reproductive: Prostate has been surgically removed. Other: No abdominal wall hernia or abnormality. No abdominopelvic ascites. Musculoskeletal: Degenerative changes of lumbar spine are noted. IMPRESSION: Minimal left pleural effusion is noted. Diverticulosis without diverticulitis. Electronically Signed   By: Alcide Clever M.D.   On: 01/26/2023 23:58   DG Ribs Unilateral Left  Result Date: 01/26/2023 CLINICAL DATA:  Recent fall with left-sided rib pain, initial encounter EXAM: LEFT RIBS - 2 VIEW COMPARISON:  None Available. FINDINGS: Cardiac shadows within normal limits. Postsurgical changes are seen. The lungs are well aerated bilaterally. No focal infiltrate or effusion is seen. No bony abnormality is noted. IMPRESSION: No acute abnormality noted. Electronically Signed   By: Alcide Clever M.D.   On: 01/26/2023 18:15      LOS: 5 days    Kitai Purdom,MD [facility] 01/27/2023, 3:02 PM   If 7PM-7AM, please contact night-coverage [email or web address]

## 2023-01-27 NOTE — Progress Notes (Signed)
Physical Therapy Treatment Patient Details Name: John [name] MRN: [id number] DOB: October 18, 1935 Today's Date: 01/27/2023   History of Present Illness Pt is an 87 y/o M admitted on 01/22/23 after presenting with c/o bloody stools & syncope with concern for active GI bleed. Pt is s/p upper GI endoscopy on 7/21 & found to have friable gastric polyp. PMH: CAD s/p CABG, s/p MVR, a-fib, PE/DVT on coumadin, PAD, HLD, HTN, vitamin D deficiency    PT Comments  Pt up walking with mobility tech in the hallway, did well and continues to tolerate extended gait distance. Discussed current status with family and patient, he really is doing much better than he did at initial PT evaluation, would likely get much more benefit next level of post-acute rehab follow up, he and family are agreeable. Updated POC. Will continue to follow while he remains in house.      Assistance Recommended at Discharge Set up Supervision/Assistance  If plan is discharge home, recommend the following:  Can travel by private vehicle    Assistance with cooking/housework;Help with stairs or ramp for entrance      Equipment Recommendations  None recommended by PT (has RW at home)    Recommendations for Other Services       Precautions / Restrictions Precautions Precautions: Fall Restrictions Weight Bearing Restrictions: No     Mobility  Bed Mobility                    Transfers Overall transfer level: Independent Equipment used: None               General transfer comment: with mobility tech    Ambulation/Gait Ambulation/Gait assistance: Min guard Gait Distance (Feet): 500 Feet Assistive device: None Gait Pattern/deviations: Decreased step length - right, Decreased step length - left, Decreased stride length, Staggering right Gait velocity: slightly decreased     General Gait Details: observed pt walking with mobility tech light min guard, 517ft in unit; ongoing gait deviations but no significant  LOB   Stairs             Wheelchair Mobility     Tilt Bed    Modified Rankin (Stroke Patients Only)       Balance Overall balance assessment: Needs assistance, History of Falls   Sitting balance-Leahy Scale: Good     Standing balance support: No upper extremity supported, During functional activity Standing balance-Leahy Scale: Fair                              Cognition Arousal/Alertness: Awake/alert Behavior During Therapy: WFL for tasks assessed/performed Overall Cognitive Status: Within Functional Limits for tasks assessed                                          Exercises      General Comments        Pertinent Vitals/Pain Pain Assessment Pain Assessment: No/denies pain Pain Score: 0-No pain    Home Living                          Prior Function            PT Goals (current goals can now be found in the care plan section) Acute Rehab PT Goals Patient Stated Goal: get better PT Goal  Formulation: With patient Time For Goal Achievement: 02/08/23 Potential to Achieve Goals: Good Progress towards PT goals: Progressing toward goals    Frequency    Min 1X/week      PT Plan Discharge plan needs to be updated    Co-evaluation              AM-PAC PT "6 Clicks" Mobility   Outcome Measure  Help needed turning from your back to your side while in a flat bed without using bedrails?: None Help needed moving from lying on your back to sitting on the side of a flat bed without using bedrails?: None Help needed moving to and from a bed to a chair (including a wheelchair)?: None Help needed standing up from a chair using your arms (e.g., wheelchair or bedside chair)?: None Help needed to walk in hospital room?: A Little Help needed climbing 3-5 steps with a railing? : A Little 6 Click Score: 22    End of Session   Activity Tolerance: Patient tolerated treatment well Patient left: in chair;with  chair alarm set;with call bell/phone within reach Nurse Communication: Mobility status PT Visit Diagnosis: Muscle weakness (generalized) (M62.81);Unsteadiness on feet (R26.81)     Time: 1450-1500 PT Time Calculation (min) (ACUTE ONLY): 10 min  Charges:      PT General Charges $$ ACUTE PT VISIT: 1 Visit (No charge visit, mobility tech was working with pt/PT observed and discussed case with family only, updated POC)                   Nedra Hai, PT, DPT 01/27/23 3:07 PM

## 2023-01-27 NOTE — TOC Transition Note (Signed)
Transition of Care [facility]) - CM/SW Discharge Note   Patient Details  Name: John [name] MRN: [id number] Date of Birth: January 25, 1936  [facility] CM/SW Contact:  Leone Haven, RN Phone Number: 01/27/2023, 3:32 PM   Clinical Narrative:    Patient is for possible dc today, pt eval rec outpatient PT, patient states he would like to do this at the [facility] on [facility].  NCM sent referral thru epic for outpatient rehab on [facility]. Daughter is at the bedside.    Final next level of care: Home/Self Care Barriers to Discharge: No Barriers Identified   Patient Goals and CMS Choice   Choice offered to / list presented to : NA  Discharge Placement                         Discharge Plan and Services Additional resources added to the After Visit Summary for   In-house Referral: NA Discharge Planning Services: CM Consult Post Acute Care Choice: NA          DME Arranged: N/A DME Agency: NA       HH Arranged: NA          Social Determinants of Health (SDOH) Interventions SDOH Screenings   Food Insecurity: No Food Insecurity (01/23/2023)  Housing: Low Risk  (01/23/2023)  Transportation Needs: No Transportation Needs (01/23/2023)  Utilities: Not At Risk (01/23/2023)  Tobacco Use: Medium Risk (01/26/2023)     Readmission Risk Interventions     No data to display

## 2023-01-27 NOTE — Plan of Care (Signed)

## 2023-01-27 NOTE — Progress Notes (Signed)
Mobility Specialist Progress Note:    01/27/23 1526  Mobility  Activity Ambulated with assistance in hallway  Level of Assistance Contact guard assist, steadying assist  Assistive Device None  Distance Ambulated (ft) 500 ft  Activity Response Tolerated well  Mobility Referral Yes  $Mobility charge 1 Mobility  Mobility Specialist Start Time (ACUTE ONLY) 1440  Mobility Specialist Stop Time (ACUTE ONLY) 1453  Mobility Specialist Time Calculation (min) (ACUTE ONLY) 13 min   Pt received sitting in recliner w/ family in the room. Pt agreeable to ambulate. Throughout session pt needed no physical assistance, just a contact guard for safety purposes. No c/o during ambulation. Returning to room pt assisted back to chair w/ all needs met.  Thompson Grayer Mobility Specialist  Please contact vis Secure Chat or  Rehab Office [phone]

## 2023-01-27 NOTE — Progress Notes (Signed)
ANTICOAGULATION CONSULT NOTE - Initial Consult  Pharmacy Consult for warfarin Indication: atrial fibrillation  Allergies  Allergen Reactions   Iodine Hives   Statins Other (See Comments)    Joint aches with Zocor, Lipitor, Crestor    Patient Measurements: Height: 5\' 9"  (175.3 cm) Weight: 74 kg (163 lb 2.3 oz) IBW/kg (Calculated) : 70.7   Vital Signs: Temp: 98.6 F (37 C) (07/24 0717) Temp Source: Oral (07/24 0717) BP: 154/55 (07/24 0717) Pulse Rate: 73 (07/24 0717)  Labs: Recent Labs    01/25/23 0111 01/26/23 0220 01/26/23 0735 01/27/23 0009  HGB 8.3* 7.9*  --  7.5*  HCT 25.6* 24.4*  --  23.1*  PLT 207 218  --  246  LABPROT 21.3*  --  17.4*  --   INR 1.8*  --  1.4*  --   CREATININE 1.91* 1.56*  --   --     Estimated Creatinine Clearance: 35.2 mL/min (A) (by C-G formula based on SCr of 1.56 mg/dL (H)).   Medical History: Past Medical History:  Diagnosis Date   Anxiety    Atrial fibrillation (HCC)    CAD (coronary artery disease)    s/p CABG   Carotid stenosis    mild by doppler   Complication of anesthesia    zenkers  diverticulum   Deep vein thrombophlebitis of leg (HCC)    DJD (degenerative joint disease)    Elevated TSH    secondary to Amio-normal free T4.    Essential hypertension, benign    GERD (gastroesophageal reflux disease)    Hemorrhoids    Hiatal hernia    Hyperlipidemia    PAF (paroxysmal atrial fibrillation) (HCC)    Amiodarone   PE (pulmonary embolism)    pre 2007. Chronic anticoagulation   Prostate cancer (HCC)    Sebaceous cyst    neck   Seborrheic dermatitis    Tubular adenoma of colon 05/2011     Assessment: 87 yo M on warfarin PTA for afib and Hx PE/DVT. Warfarin held for gastic polyp resection, now ok to restart per GI. Last dose 7/18. Pharmacy consulted to dose warfarin.   No INR today but was down to 1.4 yesterday. Patient eating 90-100% of meals.  Warfarin PTA- 2mg  on Thurs/Sat, 1.5mg  all other days (confirmed with  pt and [facility] 11/27/22 note). CBC slightly down trending post procedure. No reported bleeding.   Goal of Therapy:  INR 2-3 Monitor platelets by anticoagulation protocol: Yes   Plan:  Warfarin 3mg  x1 Monitor daily INR, CBC, signs/symptoms of bleeding    Alphia Moh, PharmD, BCPS, BCCP Clinical Pharmacist  Please check AMION for all [facility] Pharmacy phone numbers After 10:00 PM, call Main Pharmacy [phone]

## 2023-01-28 ENCOUNTER — Other Ambulatory Visit (HOSPITAL_COMMUNITY): Payer: Self-pay

## 2023-01-28 ENCOUNTER — Encounter (HOSPITAL_COMMUNITY): Payer: Self-pay | Admitting: Gastroenterology

## 2023-01-28 DIAGNOSIS — K922 Gastrointestinal hemorrhage, unspecified: Secondary | ICD-10-CM | POA: Diagnosis not present

## 2023-01-28 LAB — CBC WITH DIFFERENTIAL/PLATELET
Abs Immature Granulocytes: 0.04 10*3/uL (ref 0.00–0.07)
Basophils Absolute: 0 10*3/uL (ref 0.0–0.1)
Basophils Relative: 0 %
Eosinophils Absolute: 0.3 10*3/uL (ref 0.0–0.5)
Eosinophils Relative: 3 %
HCT: 25.3 % — ABNORMAL LOW (ref 39.0–52.0)
Hemoglobin: 8.5 g/dL — ABNORMAL LOW (ref 13.0–17.0)
Immature Granulocytes: 1 %
Lymphocytes Relative: 13 %
Lymphs Abs: 0.9 10*3/uL (ref 0.7–4.0)
MCH: 29.5 pg (ref 26.0–34.0)
MCHC: 33.6 g/dL (ref 30.0–36.0)
MCV: 87.8 fL (ref 80.0–100.0)
Monocytes Absolute: 0.7 10*3/uL (ref 0.1–1.0)
Monocytes Relative: 10 %
Neutro Abs: 5.3 10*3/uL (ref 1.7–7.7)
Neutrophils Relative %: 73 %
Platelets: 281 10*3/uL (ref 150–400)
RBC: 2.88 MIL/uL — ABNORMAL LOW (ref 4.22–5.81)
RDW: 15.5 % (ref 11.5–15.5)
WBC: 7.3 10*3/uL (ref 4.0–10.5)
nRBC: 0 % (ref 0.0–0.2)

## 2023-01-28 LAB — BASIC METABOLIC PANEL
Anion gap: 8 (ref 5–15)
BUN: 25 mg/dL — ABNORMAL HIGH (ref 8–23)
CO2: 27 mmol/L (ref 22–32)
Calcium: 8.3 mg/dL — ABNORMAL LOW (ref 8.9–10.3)
Chloride: 105 mmol/L (ref 98–111)
Creatinine, Ser: 1.45 mg/dL — ABNORMAL HIGH (ref 0.61–1.24)
GFR, Estimated: 48 mL/min — ABNORMAL LOW (ref >60)
Glucose, Bld: 99 mg/dL (ref 70–99)
Potassium: 4 mmol/L (ref 3.5–5.1)
Sodium: 140 mmol/L (ref 135–145)

## 2023-01-28 LAB — SURGICAL PATHOLOGY

## 2023-01-28 MED ORDER — WARFARIN SODIUM 2 MG PO TABS: 2.0000 mg | ORAL_TABLET | Freq: Once | ORAL | Status: DC

## 2023-01-28 MED ORDER — PANTOPRAZOLE SODIUM 40 MG PO TBEC: 40.0000 mg | DELAYED_RELEASE_TABLET | Freq: Two times a day (BID) | ORAL | 0 refills | Status: DC

## 2023-01-28 MED ORDER — PANTOPRAZOLE SODIUM 40 MG PO TBEC
40.0000 mg | DELAYED_RELEASE_TABLET | Freq: Two times a day (BID) | ORAL | 0 refills | Status: DC
  Filled 2023-01-28: qty 60, 30d supply, fill #0

## 2023-01-28 MED ORDER — SUCRALFATE 1 GM/10ML PO SUSP
1.0000 g | Freq: Three times a day (TID) | ORAL | 0 refills | Status: DC
  Filled 2023-01-28: qty 480, 12d supply, fill #0

## 2023-01-28 MED ORDER — SUCRALFATE 1 GM/10ML PO SUSP: 1.0000 g | Freq: Three times a day (TID) | ORAL | 0 refills | Status: DC

## 2023-01-28 NOTE — Progress Notes (Signed)
Pt Discharge instructions given to patient and daughter, Tele\Monitor/ electrodes, IV,and purwick removed.

## 2023-01-28 NOTE — Progress Notes (Addendum)
ANTICOAGULATION CONSULT NOTE - Initial Consult  Pharmacy Consult for warfarin Indication: atrial fibrillation  Allergies  Allergen Reactions   Iodine Hives   Statins Other (See Comments)    Joint aches with Zocor, Lipitor, Crestor    Patient Measurements: Height: 5\' 9"  (175.3 cm) Weight: 75 kg (165 lb 5.5 oz) IBW/kg (Calculated) : 70.7   Vital Signs: Temp: 98.4 F (36.9 C) (07/25 0758) Temp Source: Oral (07/25 0758) BP: 141/96 (07/25 0758) Pulse Rate: 78 (07/25 0758)  Labs: Recent Labs    01/26/23 0220 01/26/23 0735 01/27/23 0009 01/28/23 0648  HGB 7.9*  --  7.5* 8.5*  HCT 24.4*  --  23.1* 25.3*  PLT 218  --  246 281  LABPROT  --  17.4*  --   --   INR  --  1.4*  --   --   CREATININE 1.56*  --   --  1.45*    Estimated Creatinine Clearance: 37.9 mL/min (A) (by C-G formula based on SCr of 1.45 mg/dL (H)).   Medical History: Past Medical History:  Diagnosis Date   Anxiety    Atrial fibrillation (HCC)    CAD (coronary artery disease)    s/p CABG   Carotid stenosis    mild by doppler   Complication of anesthesia    zenkers  diverticulum   Deep vein thrombophlebitis of leg (HCC)    DJD (degenerative joint disease)    Elevated TSH    secondary to Amio-normal free T4.    Essential hypertension, benign    GERD (gastroesophageal reflux disease)    Hemorrhoids    Hiatal hernia    Hyperlipidemia    PAF (paroxysmal atrial fibrillation) (HCC)    Amiodarone   PE (pulmonary embolism)    pre 2007. Chronic anticoagulation   Prostate cancer (HCC)    Sebaceous cyst    neck   Seborrheic dermatitis    Tubular adenoma of colon 05/2011     Assessment: 87 yo M on warfarin PTA for afib and Hx PE/DVT. Warfarin held for gastic polyp resection, now ok to restart per GI. Last dose 7/18. Pharmacy consulted to dose warfarin.   No INR today but anticipate < 1.5 given only 1 dose of warfarin in last 7 days. Patient eating 90-100% of meals.  Warfarin PTA- 2mg  on Thurs/Sat,  1.5mg  all other days (confirmed with pt and [facility] 11/27/22 note). CBC slightly down trending post procedure. No reported bleeding.   Goal of Therapy:  INR 2-3 Monitor platelets by anticoagulation protocol: Yes   Plan:  Warfarin 2mg  x1 Monitor daily INR, CBC, signs/symptoms of bleeding  For discharge: suggest resume home dose 2mg  Thurs/Sat and 1.5mg  all other days. INR check by Monday 7/29   ADDENDUM 10:00: spoke with patient about warfarin and he stated that he as a dermatology procedure on 7/29. Asked patient to call dermatology clinic about warfarin instructions and to let [facility] know about the warfarin plan. Will not change discharge warfarin orders for now but patient and daughter are aware to follow dermatology and anticoagulation clinic instructions.   Alphia Moh, PharmD, BCPS, BCCP Clinical Pharmacist  Please check AMION for all [facility] Pharmacy phone numbers After 10:00 PM, call Main Pharmacy [phone]

## 2023-01-28 NOTE — Plan of Care (Signed)
  Problem: Education: Goal: Knowledge of General Education information will improve Description: Including pain rating scale, medication(s)/side effects and non-pharmacologic comfort measures Outcome: Progressing   Problem: Health Behavior/Discharge Planning: Goal: Ability to manage health-related needs will improve Outcome: Progressing   Problem: Activity: Goal: Risk for activity intolerance will decrease Outcome: Progressing   Problem: Elimination: Goal: Will not experience complications related to bowel motility Outcome: Progressing   Problem: Safety: Goal: Ability to remain free from injury will improve Outcome: Progressing

## 2023-01-28 NOTE — Plan of Care (Signed)

## 2023-01-28 NOTE — Plan of Care (Signed)

## 2023-01-28 NOTE — Discharge Summary (Signed)
Physician Discharge Summary   Patient: John [name] MRN: [id number] DOB: Sep 22, 1935  Admit date:     01/22/2023  Discharge date: 01/28/23  Discharge Physician: [email or web address]   PCP: Merri Brunette, MD   Recommendations at discharge:   Follow-up with primary care physician in 1 week Follow-up with gastroenterology in 1 to 2 weeks Follow-up with Coumadin clinic for PT/INR every other day until goal of 2-3  Discharge Diagnoses: Principal Problem:   GI bleed Active Problems:   ATRIAL FIBRILLATION   Gastric polyp   Bleeding on Coumadin  Resolved Problems:   * No resolved hospital problems. *  Hospital Course:  87 year old male with history of CAD s/p CABG, mitral valve, atrial fibrillation on Coumadin, PAD, hyperlipidemia, hypothyroidism, hypertension, vitamin D deficiency, anxiety/depression, cleaning thousand 7 chronic anticoagulation presents to the emergency department with complaint of 3-4 episodes of bloody stools then episode of hematemesis associated with epigastric pain.  Patient also had an episode of syncope after attempting to get off of commode.  GI is consulted, underwent endoscopy 7/21, found to have single gastric polyp with stigmata of recent bleeding.  Repeat endoscopy done on 01/29/2023, resection of the polyp was performed, continue with the Protonix, sucralfate for 2 months.  GI cleared patient to be started back on Coumadin.  Recommended patient to follow-up with the Coumadin clinic for PT/INR.  Patient is recommended to follow-up with GI as an outpatient in 1 to 2 weeks.  Hemoglobin remained stable at 8.5.  Patient was given IV iron.   Upper GI bleed -Secondary to gastric polyp -Repeat imaging performed on  7/23, resection of the polyp -Continue the Protonix 40 mg IV twice daily   Acute blood loss anemia -Hemoglobin trended down to 7.9-7.5-8.5 -Patient has baseline hemoglobin of 11.5 -Continues to have mild trend down. -Keep the patient on IV iron -S/p  C-section, sent to pathology-pending     Left flank pain -Musculoskeletal pain, reproducible pain from fall   Leukocytosis Mild. Likely reactive. Resolved.   CAD History of CABG. started back on Coumadin   Atrial fibrillation Likely paroxysmal as patient is in sinus rhythm. Patient is managed on amiodarone and Coumadin as an outpatient. -Continue amiodarone   Primary hypertension Patient is on losartan and amlodipine as an outpatient. Losartan held secondary to AKI. -Continue amlodipine   AKI on CKD stage IIIa Baseline creatinine of about 1.4-1.5. Creatinine of 1.82 on admission which initially improved with IV fluids. Creatinine worsened again to 1.91 with improvement to 1.56 with IV fluid bolus. -BMP in AM   History of PE/DVT -Had DVT PE 45 years back, since then has been on Coumadin -Started back on Coumadin cleared by GI   Hyperlipidemia -Continue Zetia   Hypothyroidism -Continue Synthroid Assessment and Plan: No notes have been filed under this hospital service. Service: Hospitalist        Consultants: Gastroenterology Procedures performed: EGD Disposition: Home Diet recommendation:  Discharge Diet Orders (From admission, onward)     Start     Ordered   01/28/23 0000  Diet - low sodium heart healthy        01/28/23 0921           Carb modified diet DISCHARGE MEDICATION: Allergies as of 01/28/2023       Reactions   Iodine Hives   Statins Other (See Comments)   Joint aches with Zocor, Lipitor, Crestor        Medication List     TAKE these medications    amiodarone  200 MG tablet Commonly known as: PACERONE TAKE 1 TABLET BY MOUTH DAILY   amLODipine 5 MG tablet Commonly known as: NORVASC TAKE 1 TABLET BY MOUTH DAILY   ezetimibe 10 MG tablet Commonly known as: ZETIA TAKE 1 TABLET BY MOUTH  DAILY   furosemide 20 MG tablet Commonly known as: LASIX Take 20 mg by mouth daily as needed for fluid.   indapamide 1.25 MG tablet Commonly  known as: LOZOL Take 0.625 mg by mouth daily.   levothyroxine 25 MCG tablet Commonly known as: SYNTHROID TAKE 1 TABLET BY MOUTH DAILY  BEFORE BREAKFAST   losartan 100 MG tablet Commonly known as: COZAAR TAKE 1 TABLET BY MOUTH DAILY   pantoprazole 40 MG tablet Commonly known as: PROTONIX Take 1 tablet (40 mg total) by mouth 2 (two) times daily.   sucralfate 1 GM/10ML suspension Commonly known as: CARAFATE Take 10 mLs (1 g total) by mouth 4 (four) times daily -  with meals and at bedtime for 12 days.   Vitamin D (Ergocalciferol) 1.25 MG (50000 UNIT) Caps capsule Commonly known as: DRISDOL Take 1 capsule (50,000 Units total) by mouth every 7 (seven) days.   VITAMIN D-3 PO Take 1 tablet by mouth daily.   warfarin 1 MG tablet Commonly known as: COUMADIN Take as directed. If you are unsure how to take this medication, talk to your nurse or doctor. Original instructions: TAKE 1 AND 1/2 TO 2 TABLETS BY  MOUTH DAILY OR AS PRESCRIBED BY  COUMADIN CLINIC What changed:  how much to take how to take this when to take this additional instructions        Follow-up Information     [facility] Follow up.   Specialty: Rehabilitation Why: they will call you to set up outpatient physical therapy, if you do not hear from him in 3 buiness days please give them a call. Contact information: [address] [place] [address] [phone]        Merri Brunette, MD Follow up.   Specialty: Internal Medicine Contact information: [address] [place] [address] [place] [address] [phone]                Discharge Exam: Ceasar Mons Weights   01/26/23 1324 01/27/23 0409 01/28/23 0411  Weight: 73.9 kg 74 kg 75 kg     Condition at discharge: stable  The results of significant diagnostics from this hospitalization (including imaging, microbiology, ancillary and laboratory) are listed below for reference.    Imaging Studies: CT ABDOMEN PELVIS WO CONTRAST  Result Date: 01/26/2023 CLINICAL DATA:  Acute abdominal pain following fall, initial encounter EXAM: CT ABDOMEN AND PELVIS WITHOUT CONTRAST TECHNIQUE: Multidetector CT imaging of the abdomen and pelvis was performed following the standard protocol without IV contrast. RADIATION DOSE REDUCTION: This exam was performed according to the departmental dose-optimization program which includes automated exposure control, adjustment of the mA and/or kV according to patient size and/or use of iterative reconstruction technique. COMPARISON:  None Available. FINDINGS: Lower chest: Minimal left effusion is noted. No significant atelectatic changes are seen. Hepatobiliary: No focal liver abnormality is seen. No gallstones, gallbladder wall thickening, or biliary dilatation. Pancreas: Unremarkable. No pancreatic ductal dilatation or surrounding inflammatory changes. Spleen: Normal in size without focal abnormality. Adrenals/Urinary Tract: Adrenal glands are within normal limits. The kidneys demonstrate no renal calculi or obstructive changes. The bladder is decompressed. Stomach/Bowel: Scattered diverticular change of the colon is noted without evidence of diverticulitis. No obstructive changes  of the colon are seen. The appendix is within normal limits. Small bowel and stomach are unremarkable. Vascular/Lymphatic: Aortic atherosclerosis. No enlarged abdominal or pelvic lymph nodes. Reproductive: Prostate has been surgically removed. Other: No abdominal wall hernia or abnormality. No abdominopelvic ascites. Musculoskeletal: Degenerative changes of lumbar spine are noted. IMPRESSION: Minimal left pleural effusion is noted. Diverticulosis without diverticulitis. Electronically Signed   By: Alcide Clever M.D.   On: 01/26/2023 23:58   DG Ribs Unilateral Left  Result Date: 01/26/2023 CLINICAL DATA:  Recent fall with left-sided rib pain, initial encounter EXAM: LEFT RIBS - 2  VIEW COMPARISON:  None Available. FINDINGS: Cardiac shadows within normal limits. Postsurgical changes are seen. The lungs are well aerated bilaterally. No focal infiltrate or effusion is seen. No bony abnormality is noted. IMPRESSION: No acute abnormality noted. Electronically Signed   By: Alcide Clever M.D.   On: 01/26/2023 18:15   CT ABDOMEN PELVIS WO CONTRAST  Result Date: 01/22/2023 CLINICAL DATA:  Abdominal pain, acute, nonlocalized. Rectal bleeding. Evaluate for colitis. EXAM: CT ABDOMEN AND PELVIS WITHOUT CONTRAST TECHNIQUE: Multidetector CT imaging of the abdomen and pelvis was performed following the standard protocol without IV contrast. RADIATION DOSE REDUCTION: This exam was performed according to the departmental dose-optimization program which includes automated exposure control, adjustment of the mA and/or kV according to patient size and/or use of iterative reconstruction technique. COMPARISON:  None Available. FINDINGS: Lower chest: Mild scarring at both lung bases. Atherosclerosis of the aorta and coronary arteries status post median sternotomy and probable mitral valve replacement. The heart size is normal. There is a small hiatal hernia. Hepatobiliary: The liver appears unremarkable as imaged in the noncontrast state. No evidence of gallstones, gallbladder wall thickening or biliary dilatation. Pancreas: Unremarkable. No pancreatic ductal dilatation or surrounding inflammatory changes. Spleen: Normal in size without focal abnormality. Adrenals/Urinary Tract: Both adrenal glands appear normal. No evidence of urinary tract calculus, suspicious renal lesion or hydronephrosis. The bladder appears unremarkable for its degree of distention. Stomach/Bowel: No enteric contrast administered. The stomach appears unremarkable for its degree of distension. No evidence of bowel wall thickening, distention or surrounding inflammatory change. The appendix appears normal. Mild descending and sigmoid colon  diverticulosis without evidence of acute inflammation. Vascular/Lymphatic: There are no enlarged abdominal or pelvic lymph nodes. Aortic and branch vessel atherosclerosis without evidence of aneurysm. Postsurgical changes in both inguinal regions. Reproductive: Status post prostatectomy. No evidence of pelvic mass. Other: No evidence of abdominal wall mass or hernia. No ascites or pneumoperitoneum. Musculoskeletal: No acute or significant osseous findings. Multilevel thoracolumbar spondylosis and ankylosis. The left sacroiliac joint is ankylosed. There are mild degenerative changes at both hips. Healed median sternotomy. Moderate bilateral gynecomastia. Unless specific follow-up recommendations are mentioned in the findings or impression sections, no imaging follow-up of any mentioned incidental findings is recommended. IMPRESSION: 1. No acute findings or explanation for the patient's symptoms. 2. Mild distal colonic diverticulosis without evidence of acute inflammation. Small hiatal hernia. 3. Status post prostatectomy without evidence of local recurrence or metastatic disease. 4.  Aortic Atherosclerosis (ICD10-I70.0). Electronically Signed   By: Carey Bullocks M.D.   On: 01/22/2023 18:43   CT Head Wo Contrast  Result Date: 01/22/2023 CLINICAL DATA:  Head trauma, minor (Age >= 65y); Neck trauma (Age >= 65y). Fall. EXAM: CT HEAD WITHOUT CONTRAST CT CERVICAL SPINE WITHOUT CONTRAST TECHNIQUE: Multidetector CT imaging of the head and cervical spine was performed following the standard protocol without intravenous contrast. Multiplanar CT image reconstructions of the cervical spine  were also generated. RADIATION DOSE REDUCTION: This exam was performed according to the departmental dose-optimization program which includes automated exposure control, adjustment of the mA and/or kV according to patient size and/or use of iterative reconstruction technique. COMPARISON:  None Available. FINDINGS: CT HEAD FINDINGS Brain:  There is no evidence of an acute infarct, intracranial hemorrhage, mass, midline shift, or extra-axial fluid collection. The ventricles and sulci are within normal limits for age. Hypodensities in the cerebral white matter are nonspecific but compatible with very mild chronic small vessel ischemic disease. Vascular: Calcified atherosclerosis at the skull base. No hyperdense vessel. Skull: No acute fracture or suspicious osseous lesion. Sinuses/Orbits: Paranasal sinuses and mastoid air cells are clear. Bilateral cataract extraction. Other: None. CT CERVICAL SPINE FINDINGS Alignment: Cervical spine straightening.  No listhesis. Skull base and vertebrae: No acute fracture or suspicious osseous lesion. Soft tissues and spinal canal: No prevertebral fluid or swelling. No visible canal hematoma. Disc levels: Moderate diffuse cervical disc degeneration and severe facet arthrosis. Interbody and left facet ankylosis at C4-5. Moderate to severe multilevel neural foraminal stenosis. Mild-to-moderate spinal stenosis at C3-4 and C4-5. Upper chest: Clear lung apices. Other: Moderate calcific atherosclerosis at the carotid bifurcations. IMPRESSION: 1. No evidence of acute intracranial abnormality. 2. No acute cervical spine fracture. 3. Advanced cervical disc and facet degeneration. Electronically Signed   By: Sebastian Ache M.D.   On: 01/22/2023 18:39   CT Cervical Spine Wo Contrast  Result Date: 01/22/2023 CLINICAL DATA:  Head trauma, minor (Age >= 65y); Neck trauma (Age >= 65y). Fall. EXAM: CT HEAD WITHOUT CONTRAST CT CERVICAL SPINE WITHOUT CONTRAST TECHNIQUE: Multidetector CT imaging of the head and cervical spine was performed following the standard protocol without intravenous contrast. Multiplanar CT image reconstructions of the cervical spine were also generated. RADIATION DOSE REDUCTION: This exam was performed according to the departmental dose-optimization program which includes automated exposure control, adjustment  of the mA and/or kV according to patient size and/or use of iterative reconstruction technique. COMPARISON:  None Available. FINDINGS: CT HEAD FINDINGS Brain: There is no evidence of an acute infarct, intracranial hemorrhage, mass, midline shift, or extra-axial fluid collection. The ventricles and sulci are within normal limits for age. Hypodensities in the cerebral white matter are nonspecific but compatible with very mild chronic small vessel ischemic disease. Vascular: Calcified atherosclerosis at the skull base. No hyperdense vessel. Skull: No acute fracture or suspicious osseous lesion. Sinuses/Orbits: Paranasal sinuses and mastoid air cells are clear. Bilateral cataract extraction. Other: None. CT CERVICAL SPINE FINDINGS Alignment: Cervical spine straightening.  No listhesis. Skull base and vertebrae: No acute fracture or suspicious osseous lesion. Soft tissues and spinal canal: No prevertebral fluid or swelling. No visible canal hematoma. Disc levels: Moderate diffuse cervical disc degeneration and severe facet arthrosis. Interbody and left facet ankylosis at C4-5. Moderate to severe multilevel neural foraminal stenosis. Mild-to-moderate spinal stenosis at C3-4 and C4-5. Upper chest: Clear lung apices. Other: Moderate calcific atherosclerosis at the carotid bifurcations. IMPRESSION: 1. No evidence of acute intracranial abnormality. 2. No acute cervical spine fracture. 3. Advanced cervical disc and facet degeneration. Electronically Signed   By: Sebastian Ache M.D.   On: 01/22/2023 18:39   DG Shoulder Right  Result Date: 01/22/2023 CLINICAL DATA:  Fall.  Syncope. EXAM: RIGHT SHOULDER - 2+ VIEW COMPARISON:  None Available. FINDINGS: The no signs of acute fracture or dislocation. Moderate to severe degenerative changes are identified at the acromioclavicular joint. Soft tissues are unremarkable. IMPRESSION: 1. No acute findings. 2. AC joint  osteoarthritis. Electronically Signed   By: Signa Kell M.D.   On:  01/22/2023 16:06   DG Wrist Complete Right  Result Date: 01/22/2023 CLINICAL DATA:  Pain, fall. EXAM: RIGHT WRIST - COMPLETE 3 VIEW COMPARISON:  None Available. FINDINGS: There is no evidence of fracture or dislocation. Severe radiocarpal joint space narrowing and sclerosis consistent with osteoarthritis. IMPRESSION: Degenerative changes.  No acute osseous abnormalities. Electronically Signed   By: Layla Maw M.D.   On: 01/22/2023 16:04   DG Chest Portable 1 View  Result Date: 01/22/2023 CLINICAL DATA:  Chest pain, syncope. EXAM: PORTABLE CHEST 1 VIEW COMPARISON:  Nov 27, 2021. FINDINGS: The heart size and mediastinal contours are within normal limits. Status post coronary artery bypass graft. Both lungs are clear. The visualized skeletal structures are unremarkable. IMPRESSION: No active disease. Electronically Signed   By: Lupita Raider M.D.   On: 01/22/2023 15:43    Microbiology: Results for orders placed or performed during the hospital encounter of 11/01/12  Surgical pcr screen     Status: None   Collection Time: 11/01/12  9:30 AM   Specimen: Nasal Mucosa; Nasal Swab  Result Value Ref Range Status   MRSA, PCR NEGATIVE NEGATIVE Final   Staphylococcus aureus NEGATIVE NEGATIVE Final    Comment:        The Xpert SA Assay (FDA approved for NASAL specimens in patients over 95 years of age), is one component of a comprehensive surveillance program.  Test performance has been validated by [employer] for patients greater than or equal to 41 year old. It is not intended to diagnose infection nor to guide or monitor treatment.    Labs: CBC: Recent Labs  Lab 01/22/23 1535 01/22/23 1549 01/24/23 0946 01/25/23 0111 01/26/23 0220 01/27/23 0009 01/28/23 0648  WBC 13.7*  --  7.9 7.7 8.7 7.7 7.3  NEUTROABS 12.1*  --   --   --   --   --  5.3  HGB 11.5*   < > 9.2* 8.3* 7.9* 7.5* 8.5*  HCT 36.2*   < > 28.6* 25.6* 24.4* 23.1* 25.3*  MCV 87.7  --  88.3 89.8 86.8 86.8 87.8  PLT  288  --  217 207 218 246 281   < > = values in this interval not displayed.   Basic Metabolic Panel: Recent Labs  Lab 01/23/23 0815 01/24/23 0100 01/25/23 0111 01/26/23 0220 01/28/23 0648  NA 137 139 137 138 140  K 4.6 4.4 4.5 3.9 4.0  CL 108 107 106 107 105  CO2 22 23 24 24 27   GLUCOSE 97 88 122* 119* 99  BUN 51* 35* 36* 25* 25*  CREATININE 1.53* 1.52* 1.91* 1.56* 1.45*  CALCIUM 8.2* 8.0* 8.0* 8.0* 8.3*   Liver Function Tests: Recent Labs  Lab 01/22/23 1535 01/23/23 0815  AST 28 24  ALT 28 22  ALKPHOS 87 67  BILITOT 0.4 0.4  PROT 6.2* 5.3*  ALBUMIN 3.5 3.0*   CBG: No results for input(s): "GLUCAP" in the last 168 hours.  Discharge time spent: greater than 30 minutes.  SignedSusa Griffins, MD [facility] 01/28/2023

## 2023-01-29 ENCOUNTER — Other Ambulatory Visit: Payer: Self-pay | Admitting: Pharmacist

## 2023-01-29 ENCOUNTER — Telehealth: Payer: Self-pay | Admitting: Pharmacist

## 2023-01-29 ENCOUNTER — Telehealth: Payer: Self-pay | Admitting: *Deleted

## 2023-01-29 NOTE — Progress Notes (Signed)
  Care Coordination   Note   01/29/2023 Name: LYNKEN MECKLEY MRN: [id number] DOB: 01-Sep-1935  HANNAN MCNAMAR is a 87 y.o. year old male who sees Merri Brunette, MD for primary care. I reached out to Lenore Manner by phone today to offer care coordination services.  Mr. Duval was given information about Care Coordination services today including:   The Care Coordination services include support from the care team which includes your Nurse Coordinator, Clinical Social Worker, or Pharmacist.  The Care Coordination team is here to help remove barriers to the health concerns and goals most important to you. Care Coordination services are voluntary, and the patient may decline or stop services at any time by request to their care team member.   Care Coordination Consent Status: Patient agreed to services and verbal consent obtained.   Follow up plan:  Telephone appointment with care coordination team member scheduled for:  02/12/2023  Encounter Outcome:  Pt. Scheduled from referral   Burman Nieves, [facility] Care Coordination Care Guide Direct Dial: [phone]

## 2023-01-29 NOTE — Anesthesia Postprocedure Evaluation (Signed)
Anesthesia Post Note  Patient: JARON MONSEES  Procedure(s) Performed: ESOPHAGOGASTRODUODENOSCOPY (EGD) WITH PROPOFOL SCLEROTHERAPY POLYPECTOMY     Patient location during evaluation: Endoscopy Anesthesia Type: MAC Level of consciousness: awake and alert Pain management: pain level controlled Vital Signs Assessment: post-procedure vital signs reviewed and stable Respiratory status: spontaneous breathing, nonlabored ventilation and respiratory function stable Cardiovascular status: stable and blood pressure returned to baseline Postop Assessment: no apparent nausea or vomiting Anesthetic complications: no   No notable events documented.  Last Vitals:  Vitals:   01/28/23 0800 01/28/23 1006  BP:  (!) 145/58  Pulse:  76  Resp:  20  Temp:  36.8 C  SpO2: 99% 98%    Last Pain:  Vitals:   01/28/23 1006  TempSrc: Oral  PainSc:                  Merion Grimaldo

## 2023-01-29 NOTE — Telephone Encounter (Signed)
Patient will be transitioned to the [facility] st infusion center for his leqvio injection. He was given the adresses and phone number. No changes in his insurance, advised to let us know if there are any changes. Apt at [facility] canceled and pt made aware they will call him to schedule. Referral sent.

## 2023-01-30 ENCOUNTER — Encounter: Payer: Self-pay | Admitting: Gastroenterology

## 2023-02-02 DIAGNOSIS — C44311 Basal cell carcinoma of skin of nose: Secondary | ICD-10-CM | POA: Diagnosis not present

## 2023-02-02 DIAGNOSIS — Z85828 Personal history of other malignant neoplasm of skin: Secondary | ICD-10-CM | POA: Diagnosis not present

## 2023-02-03 DIAGNOSIS — N1831 Chronic kidney disease, stage 3a: Secondary | ICD-10-CM | POA: Diagnosis not present

## 2023-02-03 DIAGNOSIS — Z09 Encounter for follow-up examination after completed treatment for conditions other than malignant neoplasm: Secondary | ICD-10-CM | POA: Diagnosis not present

## 2023-02-03 DIAGNOSIS — Z8719 Personal history of other diseases of the digestive system: Secondary | ICD-10-CM | POA: Diagnosis not present

## 2023-02-04 ENCOUNTER — Encounter (INDEPENDENT_AMBULATORY_CARE_PROVIDER_SITE_OTHER): Payer: Medicare Other | Admitting: Ophthalmology

## 2023-02-04 ENCOUNTER — Telehealth: Payer: Self-pay | Admitting: Pharmacy Technician

## 2023-02-04 DIAGNOSIS — H43812 Vitreous degeneration, left eye: Secondary | ICD-10-CM | POA: Diagnosis not present

## 2023-02-04 DIAGNOSIS — Z9889 Other specified postprocedural states: Secondary | ICD-10-CM | POA: Diagnosis not present

## 2023-02-04 DIAGNOSIS — H35371 Puckering of macula, right eye: Secondary | ICD-10-CM | POA: Diagnosis not present

## 2023-02-04 NOTE — Telephone Encounter (Signed)
John [name],  Patient will be scheduled as soon as possible.  Auth Submission: NO AUTH NEEDED Site of care: Site of care: [facility] Payer: medicare [employer] [place] Medication & CPT/J Code(s) submitted: Leqvio (Inclisiran) [phone] Route of submission (phone, fax, portal):  Phone # Fax # Auth type: Buy/Bill HB Units/visits requested: x2 doses Reference number:  Approval from: 02/04/23 to 02/04/24

## 2023-02-11 ENCOUNTER — Ambulatory Visit: Payer: Medicare Other

## 2023-02-12 ENCOUNTER — Ambulatory Visit: Payer: Self-pay

## 2023-02-12 NOTE — Patient Outreach (Signed)
  Care Coordination   Initial Visit Note   02/12/2023 Name: EMERSON RALLO MRN: [id number] DOB: 10-13-1935  TYDRICK VALTIERRA is a 87 y.o. year old male who sees Merri Brunette, MD for primary care. I spoke with  Lenore Manner by phone today.  What matters to the patients health and wellness today?  Recovering from GI bleed    Goals Addressed             This Visit's Progress    COMPLETED: Care coordination activities-no follow up required       Care Coordination Interventions: Discussed GI bleed and when to notify physician. Discussed [facility] services and support. Patient declined continued services an support at this time.  Assessed SDOH. Advised to discuss with primary care physician if services needed in the future.        SDOH assessments and interventions completed:  Yes  SDOH Interventions Today    Flowsheet Row Most Recent Value  SDOH Interventions   Housing Interventions Intervention Not Indicated  Transportation Interventions Intervention Not Indicated        Care Coordination Interventions:  Yes, provided   Follow up plan: No further intervention required.   Encounter Outcome:  Pt. Visit Completed   Bary Leriche, RN, MSN [facility] Care Management Care Management Coordinator Direct Line [phone]

## 2023-02-12 NOTE — Patient Instructions (Signed)
Visit Information  Thank you for taking time to visit with me today. Please don't hesitate to contact me if I can be of assistance to you.   Following are the goals we discussed today:   Goals Addressed             This Visit's Progress    COMPLETED: Care coordination activities-no follow up required       Care Coordination Interventions: Discussed GI bleed and when to notify physician. Discussed [facility] services and support. Patient declined continued services an support at this time.  Assessed SDOH. Advised to discuss with primary care physician if services needed in the future.           If you are experiencing a Mental Health or Behavioral Health Crisis or need someone to talk to, please call the Suicide and Crisis Lifeline: 988   The patient verbalized understanding of instructions, educational materials, and care plan provided today and DECLINED offer to receive copy of patient instructions, educational materials, and care plan.   The patient has been provided with contact information for the care management team and has been advised to call with any health related questions or concerns.   Bary Leriche, RN, MSN [facility] Care Management Care Management Coordinator Direct Line [phone]

## 2023-02-16 ENCOUNTER — Ambulatory Visit: Payer: Medicare Other | Attending: Internal Medicine

## 2023-02-16 DIAGNOSIS — R2689 Other abnormalities of gait and mobility: Secondary | ICD-10-CM | POA: Insufficient documentation

## 2023-02-16 DIAGNOSIS — M6281 Muscle weakness (generalized): Secondary | ICD-10-CM | POA: Diagnosis not present

## 2023-02-16 NOTE — Therapy (Signed)
OUTPATIENT PHYSICAL THERAPY LOWER EXTREMITY EVALUATION   Patient Name: John [name] MRN: [id number] DOB:[birth year], 87 y.o., male Today's Date: 02/16/2023  END OF SESSION:  PT End of Session - 02/16/23 0831     Visit Number 1    Number of Visits 9    Date for PT Re-Evaluation 04/13/23    Authorization Type MCR A & B    PT Start Time 0831    PT Stop Time 0914    PT Time Calculation (min) 43 min    Activity Tolerance Patient tolerated treatment well    Behavior During Therapy WFL for tasks assessed/performed             Past Medical History:  Diagnosis Date   Anxiety    Atrial fibrillation (HCC)    CAD (coronary artery disease)    s/p CABG   Carotid stenosis    mild by doppler   Complication of anesthesia    zenkers  diverticulum   Deep vein thrombophlebitis of leg (HCC)    DJD (degenerative joint disease)    Elevated TSH    secondary to Amio-normal free T4.    Essential hypertension, benign    GERD (gastroesophageal reflux disease)    Hemorrhoids    Hiatal hernia    Hyperlipidemia    PAF (paroxysmal atrial fibrillation) (HCC)    Amiodarone   PE (pulmonary embolism)    pre 2007. Chronic anticoagulation   Prostate cancer (HCC)    Sebaceous cyst    neck   Seborrheic dermatitis    Tubular adenoma of colon 05/2011   Past Surgical History:  Procedure Laterality Date   bilateral shoulder surgery     CORONARY ARTERY BYPASS GRAFT     x2    2009   ESOPHAGOGASTRODUODENOSCOPY (EGD) WITH PROPOFOL N/A 01/24/2023   Procedure: ESOPHAGOGASTRODUODENOSCOPY (EGD) WITH PROPOFOL;  Surgeon: Napoleon Form, MD;  Location: [facility] ENDOSCOPY;  Service: Gastroenterology;  Laterality: N/A;   ESOPHAGOGASTRODUODENOSCOPY (EGD) WITH PROPOFOL N/A 01/26/2023   Procedure: ESOPHAGOGASTRODUODENOSCOPY (EGD) WITH PROPOFOL;  Surgeon: Jenel Lucks, MD;  Location: [facility] ENDOSCOPY;  Service: Gastroenterology;  Laterality: N/A;   I & D EXTREMITY  07/09/2011   Procedure: IRRIGATION AND  DEBRIDEMENT EXTREMITY;  Surgeon: Tami Ribas;  Location: [facility];  Service: Orthopedics;  Laterality: Left;   left knee arthroscopy     MITRAL VALVE REPAIR     POLYPECTOMY  01/26/2023   Procedure: POLYPECTOMY;  Surgeon: Jenel Lucks, MD;  Location: [facility] ENDOSCOPY;  Service: Gastroenterology;;   prostate cancer surgery  11/1996   radial retropubic prostatectomy   SCLEROTHERAPY  01/26/2023   Procedure: SCLEROTHERAPY;  Surgeon: Jenel Lucks, MD;  Location: [facility] ENDOSCOPY;  Service: Gastroenterology;;  Epi prior to polyp removal   TANDEM AND OVOID INSERTION     VASECTOMY     ZENKER'S DIVERTICULECTOMY N/A 11/07/2012   Procedure: ZENKER'S DIVERTICULECTOMY ENDOSCOPIC;  Surgeon: Christia Reading, MD;  Location: [facility] OR;  Service: ENT;  Laterality: N/A;   Patient Active Problem List   Diagnosis Date Noted   Bleeding on Coumadin 01/25/2023   Gastric polyp 01/24/2023   GI bleed 01/22/2023   Epiretinal membrane (ERM) of right eye 04/16/2021   Cystoid macular edema of right eye 10/16/2019   Diplopia 10/16/2019   Retinal detachment of right eye with single break 10/16/2019   Pre-operative clearance 02/08/2018   Claudication in peripheral vascular disease (HCC) 01/14/2018   Femoral bruit 01/14/2018   Fatigue 01/14/2018   Elevated TSH  PAF (paroxysmal atrial fibrillation) (HCC)    H/O mitral valve repair 09/09/2017   Vitamin D deficiency 09/09/2017   Prostate cancer (HCC)    Hyperlipidemia    Hiatal hernia    GERD (gastroesophageal reflux disease)    DJD (degenerative joint disease)    Deep vein thrombophlebitis of leg (HCC)    Complication of anesthesia    Carotid stenosis    Anxiety    Shingles 09/09/2015   History of DVT (deep vein thrombosis) 09/21/2014   History of excision of Zenker's diverticulum 10/04/2013   Zenker's diverticulum 09/12/2012   Dysphagia 03/14/2012   Bronchitis, acute 10/30/2011   Colon polyps 09/07/2011   Tubular adenoma of colon 05/07/2011    Hx of CABG 03/17/2011   Panic anxiety syndrome 03/10/2011   Long term (current) use of anticoagulants 08/31/2010   Mitral valve disorder 12/10/2009   History of pulmonary embolism 04/25/2009   DEEP VENOUS THROMBOPHLEBITIS 04/25/2009   HEMORRHOIDS 04/25/2009   GASTROESOPHAGEAL REFLUX DISEASE 04/25/2009   SEBORRHEIC DERMATITIS 04/25/2009   SEBACEOUS CYST, NECK 04/25/2009   Osteoarthritis 04/25/2009   Orthostatic hypotension 04/24/2009   NECK PAIN 04/24/2009   DIZZINESS 04/24/2009   Essential hypertension 01/10/2009   MITRAL VALVE REPLACEMENT, HX OF 01/10/2009   ATRIAL FIBRILLATION 10/25/2008    PCP: Merri Brunette, MD  REFERRING PROVIDER: Susa Griffins, MD  REFERRING DIAG: Muscle weakness (generalized) (M62.81);Unsteadiness on feet (R26.81)   THERAPY DIAG:  Other abnormalities of gait and mobility  Muscle weakness (generalized)  Rationale for Evaluation and Treatment: Rehabilitation  ONSET DATE: 01/22/23  SUBJECTIVE:   SUBJECTIVE STATEMENT: Patient reporting to PT d/t some weakness and balance difficulties. He was previously hospitalized d/t synope and collapse with additional medical history as noted above. He is having difficulty with walking and navigating hills, states "I get short of breath." He states that his breathing difficulty with exertion that has been worsening lately. He reports difficulty with depth perception with his Rt eye and had several failed surgeries for a detached retina. He has resulting diplopia for which he sometimes wears a Rt eye patch.   PERTINENT HISTORY: PMHx includes anxiety, atriall fibrillation, CAD with CABG, carotid stenosis, DVT, DJD, essential HTN, GERD, PAF, PE, prostate cancer, GI bleed, Diplopia, mitral valve repair, OA, orthostatic hypotension  PAIN:  Are you having pain? No  PRECAUTIONS: None  RED FLAGS: None   WEIGHT BEARING RESTRICTIONS: No  FALLS:  Has patient fallen in last 6 months? Yes. Number of falls 2 years,  d/t syncope  LIVING ENVIRONMENT: Lives with: lives alone Lives in: House/apartment Stairs: Yes: External: 3 steps; none "I'm not using the stairs inside really, all the bedrooms are downstairs"  Has following equipment at home: None  OCCUPATION: retired  PLOF: Independent, Independent with basic ADLs, Independent with household mobility without device, Independent with homemaking with ambulation, and uses assistance for   PATIENT GOALS: "I would like to be a walking a duration with heel navigation"   NEXT MD VISIT: 02/18/23  OBJECTIVE:   DIAGNOSTIC FINDINGS: n/a  PATIENT SURVEYS:  FOTO: 59 current, 10 predicted   COGNITION: Overall cognitive status: Within functional limits for tasks assessed     SENSATION: WFL  POSTURE: rounded shoulders, forward head, and swayback while standing   LOWER EXTREMITY MMT:  MMT Right eval Left eval  Hip flexion 5 5  Hip extension    Hip abduction 4+ 4+  Hip adduction 4+ 4+  Hip internal rotation    Hip external rotation  Knee flexion    Knee extension 5 5  Ankle dorsiflexion 5 5  Ankle plantarflexion    Ankle inversion    Ankle eversion     (Blank rows = not tested)  FUNCTIONAL TESTS:  5 times sit to stand: 17 sec   Norm for 80+ y.o. is <14 sec  Timed up and go (TUG): not performed at eval 6 minute walk test: not performed at eval   GAIT: Distance walked: 50 Assistive device utilized: None Level of assistance: Complete Independence Comments: ambulates w/o AD, normal to wide BOS, slightly diminished gait speed.   BALANCE:   Balance    Romberg EO on floor  30s  Romberg EC on floor 30s  Romberg EO on airex 30s  Romberg EC on airex 30s with mod sway    Balance Right Left   Tandem on floor 22s 20s  SLS on floor  4s 5s      TODAY'S TREATMENT:                                                                                                                               [facility] Adult PT Treatment:                                                 DATE: 02/16/2023  See patient education and home exercise prescription as noted below.    PATIENT EDUCATION:  Education details: reviewed initial home exercise program; discussion of POC, prognosis and goals for skilled PT   Person educated: Patient Education method: Explanation, Demonstration, and Handouts Education comprehension: verbalized understanding, returned demonstration, and needs further education  HOME EXERCISE PROGRAM: Access Code: [email or web address] URL: [email or web address] Date: 02/16/2023 Prepared by: Mauri Reading  Exercises - Standing Marching  - 2 x daily - 7 x weekly - 1 sets - 10 reps - 10-20 sec hold  ASSESSMENT:  CLINICAL IMPRESSION: Patient is a 87 y.o. male who was seen today for physical therapy evaluation and treatment for balance and gait deficits related to recent syncope related hospital admission. He is demonstrating decreased static standing balance, diminished dynamic balance, altered standing posture and slow gait speed. He requires increased time after sit to stand before he is able to begin walking. He has related difficulty with participating in normal daily activities including navigating hills, stairs and walking for physical fitness. He requires skilled PT services at this time to address relevant deficits and improve overall function.     OBJECTIVE IMPAIRMENTS: Abnormal gait, decreased activity tolerance, decreased balance, decreased endurance, decreased mobility, decreased strength, decreased safety awareness, postural dysfunction, and pain.   ACTIVITY LIMITATIONS: carrying, standing, stairs, transfers, dressing, and locomotion level  PARTICIPATION LIMITATIONS: meal prep, cleaning, shopping, and community activity  PERSONAL FACTORS: Age, Past/current experiences, Time since onset of injury/illness/exacerbation, and 3+ comorbidities: PMHx  includes anxiety, atriall fibrillation, CAD with CABG, carotid stenosis,  DVT, DJD, essential HTN, GERD, PAF, PE, prostate cancer, GI bleed, Diplopia, mitral valve repair, OA, orthostatic hypotension  are also affecting patient's functional outcome.   REHAB POTENTIAL: Fair    CLINICAL DECISION MAKING: Evolving/moderate complexity  EVALUATION COMPLEXITY: Moderate   GOALS: Goals reviewed with patient? Yes  SHORT TERM GOALS: Target date: 03/16/2023   Patient will be independent with initial home program for SLS balance.  Baseline: provided at eval  Goal status: INITIAL   2.  Patient will demonstrate improved tandem stance to at least 25-30 sec in order to decrease risk of falls.  Baseline:  Balance Right Left   Tandem on floor 22s 20s   Goal status: INITIAL  3.  Patient will be able to complete 5x sit to stand in <14 seconds or less.  Baseline: 17 sec Goal status: INITIAL   LONG TERM GOALS: Target date: 04/13/2023   Patient will demonstrate improved SLS to at least 15-20 sec each LE in order to decrease risk of falls.   Baseline:  Balance Right Left   SLS on floor  4s 5s   Goal status: INITIAL  2.  Patient will demonstrate improved postural awareness for at least 15 minutes while standing without need for cueing from PT.   Baseline: see objective measures  Goal status: INITIAL   3.  Patient will demonstrate low fall risk as noted by TUG score.  Baseline: to be assessed at initial evaluation Goal status: INITIAL  4.  Patient will report ability to return to community ambulation including safe navigation of hills in order to return to normal exercise routine.  Baseline: unable to tolerate.  Goal status: INITIAL  5.  Patient will demonstrate ability to ascend and descend 6" step at least 10x without LOB and use of UE support as appropriate.  Baseline: severe difficulty related to visual deficits and LE weakness.  Goal status: INITIAL    PLAN:  PT FREQUENCY: 1-2x/week  PT DURATION: 8 weeks  PLANNED INTERVENTIONS: Therapeutic  exercises, Therapeutic activity, Neuromuscular re-education, Balance training, Gait training, Patient/Family education, Self Care, Aquatic Therapy, Manual therapy, and Re-evaluation  PLAN FOR NEXT SESSION: Static & dynamic balance with focus on navigating narrow BOS, SLS, navigating stairs and inclined surfaces, weight bearing LE strengthening, navigating uneven surfaces   Mauri Reading, PT, DPT 02/16/2023, 3:58 PM

## 2023-02-17 NOTE — Progress Notes (Unsigned)
Cardiology Clinic Note   Date: 02/18/2023 ID: John [name], DOB 04/27/1936, MRN [id number]  Primary Cardiologist:  [name], MD  Patient Profile    John [name] is a 87 y.o. male who presents to the clinic today for routine follow-up.    Past medical history significant for: CAD. R/LHC 07/12/2007 (preop MVR): The left main arises as a separate ostia from the right coronary cusp. It appears to pass anterior to the pulmonary artery. This was best judged by the RAO cranial angulation.  The left main was free of disease.  The LAD was a small vessel.  There appeared to be about 40-50% stenosis after the takeoff of the ramus intermediate, although again this was a small vessel distal to this.  Circumflex in the AV groove had luminal irregularities.  Ramus intermediate was large and normal.  Mid obtuse marginal was large and normal.  There were 2 posterolaterals which were small to moderate-sized and normal.  Right coronary artery was a dominant vessel.  It was normal throughout its course.  The PDA was large and normal. CABG x 2 07/26/2007: LIMA to LAD.  SVG to OM1. Nuclear stress test 06/25/2021: Normal, low risk study with no significant change from prior 2010. Anomalous left main off right coronary. PAF. Onset January 2009 postop MVR. DCCV March 2018, June 2018. Mitral valve disorder. Mitral valve repair 07/26/2007: 26 mm [employer] memo 3D ring annuloplasty. Echo 05/08/2022: EF 55 to 60%.  Mild concentric LVH.  Indeterminate diastolic parameters.  Normal RV function.  Mild RVH.  Normal PA pressure.  Severe LAE.  Well-seated mitral 26 mm annular ring, mean gradient 8 mmHg (stable from prior December 2022), trivial MR.  Mild AI.  Aortic valve sclerosis/calcification without stenosis. Carotid artery stenosis. Carotid duplex 02/19/2022: 1 to 39% bilateral ICA. Unchanged from August 2022 Hypertension. Hyperlipidemia. PE. GERD. CKD stage IIIb.     History of Present Illness    John [name] is a longtime patient of cardiology.  He is followed by Dr. [name] for the above outlined history.  Patient underwent CABG x 2 and MVR in January 2009.  Postoperative course complicated by A-fib.  He underwent DCCV in March 2018 in June 2018.  He has not amiodarone and Coumadin.  Nuclear stress test December 2022 was a normal, low risk study.  Echo November 2023 showed normal LV/RV function, severe LAE, well-seated mitral annular ring with mean gradient 8 mmHg and trivial MR.  Carotid duplex August 2023 showed stable mild stenosis bilaterally.  Patient was last seen in the office by Dr. [name] on 04/24/2022 for routine follow-up.  Amiodarone labs were checked at that time.  He had previously declined apixaban secondary to expense but was encouraged to check with insurance regarding coverage.  He was instructed to continue SBE prophylaxis.  He will have repeat echo December 2024.  No changes made.  Patient presented to the ED on on 01/22/2023 for syncopal event.  Patient reports generalized weakness, nausea vomiting and diarrhea with hematemesis and melena and epigastric pain.  Physical exam showed frank melenic stool and tenderness to palpation.  CTA was considered but patient with contrast allergy.  He appeared hemodynamically stable and GI was consulted regarding possible upper versus lower endoscopy.  Hemoglobin 11.5.  Potassium 5.5.  INR 1.8.  Troponin negative x 2.  Repeat hemoglobin 9.6.  Coumadin held.  Lowest hemoglobin during hospitalization 7.5.  EGD was performed which showed 14 mm friable polyp in gastric cardia likely source  of bleed.  It was not removed secondary to INR 2.1.  Repeat EGD performed after holding Coumadin and polyp was resected.  Coumadin resumed by GI.  Patient was discharged on 01/28/2023 with recommendations to follow-up with Coumadin clinic for INR.  Today, patient is here alone.  Unfortunately his wife passed and March 2024.  He has been doing well since discharge from  the hospital and has experienced no further bleeding.  He has begun working with rehab to improve his balance and will attempt for 8 weeks.  He did not look into Eliquis with his insurance company but feels it is still not financially possible.  He requests an INR check today.  He still has some mild DOE since hospitalization that is slowly improving.  He is otherwise without complaint today.    ROS: All other systems reviewed and are otherwise negative except as noted in History of Present Illness.  Studies Reviewed      EKG is not ordered today.  Risk Assessment/Calculations     CHA2DS2-VASc Score = 3   This indicates a 3.2% annual risk of stroke. The patient's score is based upon: CHF History: 0 HTN History: 1 Diabetes History: 0 Stroke History: 0 Vascular Disease History: 0 Age Score: 2 Gender Score: 0    HYPERTENSION CONTROL Vitals:   02/18/23 0927 02/18/23 1155  BP: (!) 172/64 (!) 142/60    The patient's blood pressure is elevated above target today.  In order to address the patient's elevated BP: Blood pressure will be monitored at home to determine if medication changes need to be made.           Physical Exam    VS:  BP (!) 142/60 (BP Location: Left Arm, Patient Position: Sitting, Cuff Size: Normal)   Pulse 74   Ht 5\' 9"  (1.753 m)   Wt 165 lb 3.2 oz (74.9 kg)   SpO2 97%   BMI 24.40 kg/m  , BMI Body mass index is 24.4 kg/m.  GEN: Well nourished, well developed, in no acute distress. Neck: No JVD or carotid bruits. Cardiac:  RRR. No murmurs. No rubs or gallops.   Respiratory:  Respirations regular and unlabored. Clear to auscultation without rales, wheezing or rhonchi. GI: Soft, nontender, nondistended. Extremities: Radials/DP/PT 2+ and equal bilaterally. No clubbing or cyanosis. No edema.  Skin: Warm and dry, no rash. Neuro: Strength intact.  Assessment & Plan    CAD.  S/p CABG x 08 July 2007.  Nuclear stress test December 2022 was a normal low  risk study. Patient denies chest pain, tightness, pressure.  Continue Zetia.  Patient not on aspirin secondary to Coumadin. Mitral valve disorder.  S/p MVR January 2009.  Echo November 2023 showed normal LV/RV function, severe LAE, well-seated mitral annular ring, mean gradient 8 mmHg, trivial MR.  Unchanged from prior study December 2022.  Patient denies lower extremity edema.  No lightheadedness, dizziness, presyncope, syncope since hospital discharge in July.  Will get repeat echo December 2024. PAF.  Onset January 2009 postop MVR.  DCCV March 2018 and June 2018. Patient denies palpitations.  It appears he was in normal sinus rhythm throughout hospital admission.  RRR on exam today.  Denies further spontaneous bleeding concerns.  Continue amiodarone, Coumadin. Carotid artery stenosis.  Carotid duplex August 2023 showed 1 to 39% bilateral ICA, unchanged from August 2022.  Denies lightheadedness, dizziness, presyncope, syncope since hospital admission. Hypertension: BP today 172/64 on intake and 142/60 on my recheck.  Patient did not take  medications this morning as he was away from home pet sitting.  Patient denies headaches, dizziness or vision changes. Continue amlodipine, losartan.  Disposition: Echo in December.  Return in 6 months or sooner as needed.         Signed, Etta Grandchild. , DNP, NP-C

## 2023-02-18 ENCOUNTER — Ambulatory Visit: Payer: Medicare Other | Admitting: *Deleted

## 2023-02-18 ENCOUNTER — Ambulatory Visit: Payer: Medicare Other | Attending: Student | Admitting: Student

## 2023-02-18 ENCOUNTER — Encounter: Payer: Self-pay | Admitting: Student

## 2023-02-18 VITALS — BP 142/60 | HR 74 | Ht 69.0 in | Wt 165.2 lb

## 2023-02-18 DIAGNOSIS — Z5181 Encounter for therapeutic drug level monitoring: Secondary | ICD-10-CM | POA: Diagnosis not present

## 2023-02-18 DIAGNOSIS — Z9889 Other specified postprocedural states: Secondary | ICD-10-CM | POA: Insufficient documentation

## 2023-02-18 DIAGNOSIS — Z951 Presence of aortocoronary bypass graft: Secondary | ICD-10-CM | POA: Insufficient documentation

## 2023-02-18 DIAGNOSIS — I480 Paroxysmal atrial fibrillation: Secondary | ICD-10-CM | POA: Insufficient documentation

## 2023-02-18 DIAGNOSIS — I2510 Atherosclerotic heart disease of native coronary artery without angina pectoris: Secondary | ICD-10-CM | POA: Insufficient documentation

## 2023-02-18 DIAGNOSIS — I6523 Occlusion and stenosis of bilateral carotid arteries: Secondary | ICD-10-CM | POA: Diagnosis not present

## 2023-02-18 DIAGNOSIS — I10 Essential (primary) hypertension: Secondary | ICD-10-CM | POA: Diagnosis not present

## 2023-02-18 DIAGNOSIS — I059 Rheumatic mitral valve disease, unspecified: Secondary | ICD-10-CM | POA: Diagnosis not present

## 2023-02-18 LAB — POCT INR: INR: 2 (ref 2.0–3.0)

## 2023-02-18 NOTE — Patient Instructions (Signed)
Description   Continue taking warfarin 1.5 tablets daily except on Sundays and Thursdays take 2 tablets.  Recheck INR in 4 weeks.  Coumadin Clinic [phone]

## 2023-02-18 NOTE — Patient Instructions (Signed)
Medication Instructions:  No changes to medication *If you need a refill on your cardiac medications before your next appointment, please call your pharmacy*   Lab Work: None  If you have labs (blood work) drawn today and your tests are completely normal, you will receive your results only by: MyChart Message (if you have MyChart) OR A paper copy in the mail If you have any lab test that is abnormal or we need to change your treatment, we will call you to review the results.   Testing/Procedures: Your physician has requested that you have an echocardiogram. Echocardiography is a painless test that uses sound waves to create images of your heart. It provides your doctor with information about the size and shape of your heart and how well your heart's chambers and valves are working. This procedure takes approximately one hour. There are no restrictions for this procedure. Please do NOT wear cologne, aftershave, or lotions (deodorant is allowed). Please arrive 15 minutes prior to your appointment time.    Follow-Up: At [facility], you and your health needs are our priority.  As part of our continuing mission to provide you with exceptional heart care, we have created designated Provider Care Teams.  These Care Teams include your primary Cardiologist (physician) and Advanced Practice Providers (APPs -  Physician Assistants and Nurse Practitioners) who all work together to provide you with the care you need, when you need it.  We recommend signing up for the patient portal called "MyChart".  Sign up information is provided on this After Visit Summary.  MyChart is used to connect with patients for Virtual Visits (Telemedicine).  Patients are able to view lab/test results, encounter notes, upcoming appointments, etc.  Non-urgent messages can be sent to your provider as well.   To learn more about what you can do with MyChart, go to [email or web address].    Your next appointment:   6  month(s)  Provider:   [name], MD     Other Instructions: A letter will be mailed to you as a reminder to call the office for your 6 month follow up appointment.

## 2023-02-23 ENCOUNTER — Encounter: Payer: Self-pay | Admitting: Physical Therapy

## 2023-02-23 ENCOUNTER — Ambulatory Visit: Payer: Medicare Other | Admitting: Physical Therapy

## 2023-02-23 DIAGNOSIS — R2689 Other abnormalities of gait and mobility: Secondary | ICD-10-CM

## 2023-02-23 DIAGNOSIS — M6281 Muscle weakness (generalized): Secondary | ICD-10-CM | POA: Diagnosis not present

## 2023-02-23 NOTE — Therapy (Signed)
Daily Note   Patient Name: John [name] MRN: [id number] DOB:20-Apr-1936, 87 y.o., male Today's Date: 02/23/2023  END OF SESSION:  PT End of Session - 02/23/23 0829     Visit Number 2    Number of Visits 9    Date for PT Re-Evaluation 04/13/23    Authorization Type MCR A & B    PT Start Time 0830    PT Stop Time 0911    PT Time Calculation (min) 41 min    Activity Tolerance Patient tolerated treatment well    Behavior During Therapy WFL for tasks assessed/performed             Past Medical History:  Diagnosis Date   Anxiety    Atrial fibrillation (HCC)    CAD (coronary artery disease)    s/p CABG   Carotid stenosis    mild by doppler   Complication of anesthesia    zenkers  diverticulum   Deep vein thrombophlebitis of leg (HCC)    DJD (degenerative joint disease)    Elevated TSH    secondary to Amio-normal free T4.    Essential hypertension, benign    GERD (gastroesophageal reflux disease)    Hemorrhoids    Hiatal hernia    Hyperlipidemia    PAF (paroxysmal atrial fibrillation) (HCC)    Amiodarone   PE (pulmonary embolism)    pre 2007. Chronic anticoagulation   Prostate cancer (HCC)    Sebaceous cyst    neck   Seborrheic dermatitis    Tubular adenoma of colon 05/2011   Past Surgical History:  Procedure Laterality Date   bilateral shoulder surgery     CORONARY ARTERY BYPASS GRAFT     x2    2009   ESOPHAGOGASTRODUODENOSCOPY (EGD) WITH PROPOFOL N/A 01/24/2023   Procedure: ESOPHAGOGASTRODUODENOSCOPY (EGD) WITH PROPOFOL;  Surgeon: Napoleon Form, MD;  Location: [facility] ENDOSCOPY;  Service: Gastroenterology;  Laterality: N/A;   ESOPHAGOGASTRODUODENOSCOPY (EGD) WITH PROPOFOL N/A 01/26/2023   Procedure: ESOPHAGOGASTRODUODENOSCOPY (EGD) WITH PROPOFOL;  Surgeon: Jenel Lucks, MD;  Location: [facility] ENDOSCOPY;  Service: Gastroenterology;  Laterality: N/A;   I & D EXTREMITY  07/09/2011   Procedure: IRRIGATION AND DEBRIDEMENT EXTREMITY;  Surgeon: Tami Ribas;   Location: [facility];  Service: Orthopedics;  Laterality: Left;   left knee arthroscopy     MITRAL VALVE REPAIR     POLYPECTOMY  01/26/2023   Procedure: POLYPECTOMY;  Surgeon: Jenel Lucks, MD;  Location: [facility] ENDOSCOPY;  Service: Gastroenterology;;   prostate cancer surgery  11/1996   radial retropubic prostatectomy   SCLEROTHERAPY  01/26/2023   Procedure: SCLEROTHERAPY;  Surgeon: Jenel Lucks, MD;  Location: [facility] ENDOSCOPY;  Service: Gastroenterology;;  Epi prior to polyp removal   TANDEM AND OVOID INSERTION     VASECTOMY     ZENKER'S DIVERTICULECTOMY N/A 11/07/2012   Procedure: ZENKER'S DIVERTICULECTOMY ENDOSCOPIC;  Surgeon: Christia Reading, MD;  Location: [facility] OR;  Service: ENT;  Laterality: N/A;   Patient Active Problem List   Diagnosis Date Noted   Bleeding on Coumadin 01/25/2023   Gastric polyp 01/24/2023   GI bleed 01/22/2023   Epiretinal membrane (ERM) of right eye 04/16/2021   Cystoid macular edema of right eye 10/16/2019   Diplopia 10/16/2019   Retinal detachment of right eye with single break 10/16/2019   Pre-operative clearance 02/08/2018   Claudication in peripheral vascular disease (HCC) 01/14/2018   Femoral bruit 01/14/2018   Fatigue 01/14/2018   Elevated TSH    PAF (  paroxysmal atrial fibrillation) (HCC)    H/O mitral valve repair 09/09/2017   Vitamin D deficiency 09/09/2017   Prostate cancer (HCC)    Hyperlipidemia    Hiatal hernia    GERD (gastroesophageal reflux disease)    DJD (degenerative joint disease)    Deep vein thrombophlebitis of leg (HCC)    Complication of anesthesia    Carotid stenosis    Anxiety    Shingles 09/09/2015   History of DVT (deep vein thrombosis) 09/21/2014   History of excision of Zenker's diverticulum 10/04/2013   Zenker's diverticulum 09/12/2012   Dysphagia 03/14/2012   Bronchitis, acute 10/30/2011   Colon polyps 09/07/2011   Tubular adenoma of colon 05/07/2011   Hx of CABG 03/17/2011   Panic anxiety  syndrome 03/10/2011   Long term (current) use of anticoagulants 08/31/2010   Mitral valve disorder 12/10/2009   History of pulmonary embolism 04/25/2009   DEEP VENOUS THROMBOPHLEBITIS 04/25/2009   HEMORRHOIDS 04/25/2009   GASTROESOPHAGEAL REFLUX DISEASE 04/25/2009   SEBORRHEIC DERMATITIS 04/25/2009   SEBACEOUS CYST, NECK 04/25/2009   Osteoarthritis 04/25/2009   Orthostatic hypotension 04/24/2009   NECK PAIN 04/24/2009   DIZZINESS 04/24/2009   Essential hypertension 01/10/2009   MITRAL VALVE REPLACEMENT, HX OF 01/10/2009   ATRIAL FIBRILLATION 10/25/2008    PCP: Merri Brunette, MD  REFERRING PROVIDER: Susa Griffins, MD  REFERRING DIAG: Muscle weakness (generalized) (M62.81);Unsteadiness on feet (R26.81)   THERAPY DIAG:  Other abnormalities of gait and mobility  Muscle weakness (generalized)  Rationale for Evaluation and Treatment: Rehabilitation  ONSET DATE: 01/22/23  SUBJECTIVE:   SUBJECTIVE STATEMENT: Pt reports that he has been completing his HEP but that this causes some discomfort in his R hip.  PERTINENT HISTORY: PMHx includes anxiety, atriall fibrillation, CAD with CABG, carotid stenosis, DVT, DJD, essential HTN, GERD, PAF, PE, prostate cancer, GI bleed, Diplopia, mitral valve repair, OA, orthostatic hypotension  PAIN:  Are you having pain? No  PRECAUTIONS: None  RED FLAGS: None   WEIGHT BEARING RESTRICTIONS: No  FALLS:  Has patient fallen in last 6 months? Yes. Number of falls 2 years, d/t syncope  LIVING ENVIRONMENT: Lives with: lives alone Lives in: House/apartment Stairs: Yes: External: 3 steps; none "I'm not using the stairs inside really, all the bedrooms are downstairs"  Has following equipment at home: None  OCCUPATION: retired  PLOF: Independent, Independent with basic ADLs, Independent with household mobility without device, Independent with homemaking with ambulation, and uses assistance for   PATIENT GOALS: "I would like to be a  walking a duration with heel navigation"   NEXT MD VISIT: 02/18/23  OBJECTIVE:   DIAGNOSTIC FINDINGS: n/a  PATIENT SURVEYS:  FOTO: 59 current, 45 predicted   COGNITION: Overall cognitive status: Within functional limits for tasks assessed     SENSATION: WFL  POSTURE: rounded shoulders, forward head, and swayback while standing   LOWER EXTREMITY MMT:  MMT Right eval Left eval  Hip flexion 5 5  Hip extension    Hip abduction 4+ 4+  Hip adduction 4+ 4+  Hip internal rotation    Hip external rotation    Knee flexion    Knee extension 5 5  Ankle dorsiflexion 5 5  Ankle plantarflexion    Ankle inversion    Ankle eversion     (Blank rows = not tested)  FUNCTIONAL TESTS:  5 times sit to stand: 17 sec   Norm for 80+ y.o. is <14 sec  Timed up and go (TUG): not performed at eval 6 minute  walk test: not performed at eval   GAIT: Distance walked: 50 Assistive device utilized: None Level of assistance: Complete Independence Comments: ambulates w/o AD, normal to wide BOS, slightly diminished gait speed.   BALANCE:   Balance    Romberg EO on floor  30s  Romberg EC on floor 30s  Romberg EO on airex 30s  Romberg EC on airex 30s with mod sway    Balance Right Left   Tandem on floor 22s 20s  SLS on floor  4s 5s      TODAY'S TREATMENT:  [facility] Adult PT Treatment: 8/20  Therapeutic Exercise:  LTR Hip adduction squeeze - 2x10 Glute bridge - 2x10 Alternating march with YTB - 2x10 Knee ext machine - 10# - 2x10 Knee flexion machine - 20# - 2x10  Neuromuscular re-ed: Semi tandem - 100% Semi tandem 50% on foam Lateral walking on airex beam (stopped d/t complaint of dizziness)                                                                                                                                 [facility] Adult PT Treatment:                                                DATE: 02/16/2023  See patient education and home exercise prescription as noted below.     PATIENT EDUCATION:  Education details: reviewed initial home exercise program; discussion of POC, prognosis and goals for skilled PT   Person educated: Patient Education method: Explanation, Demonstration, and Handouts Education comprehension: verbalized understanding, returned demonstration, and needs further education  HOME EXERCISE PROGRAM: Access Code: [email or web address] URL: [email or web address] Date: 02/16/2023 Prepared by: Mauri Reading  Exercises - Standing Marching  - 2 x daily - 7 x weekly - 1 sets - 10 reps - 10-20 sec hold  ASSESSMENT:  CLINICAL IMPRESSION: Saajan tolerated session well with no adverse reaction.  Concentrated on basic hip strengthening combined with balance.  Pt tolerated strengthening with some discomfort but no lasting increase in pain.  CGA provided throughout for balance.  Pt reports mild dizziness with lateral walking after ~1.5 min and this was d/c'd with resolution of sxs.  OBJECTIVE IMPAIRMENTS: Abnormal gait, decreased activity tolerance, decreased balance, decreased endurance, decreased mobility, decreased strength, decreased safety awareness, postural dysfunction, and pain.   ACTIVITY LIMITATIONS: carrying, standing, stairs, transfers, dressing, and locomotion level  PARTICIPATION LIMITATIONS: meal prep, cleaning, shopping, and community activity  PERSONAL FACTORS: Age, Past/current experiences, Time since onset of injury/illness/exacerbation, and 3+ comorbidities: PMHx includes anxiety, atriall fibrillation, CAD with CABG, carotid stenosis, DVT, DJD, essential HTN, GERD, PAF, PE, prostate cancer, GI bleed, Diplopia, mitral valve repair, OA, orthostatic hypotension  are also affecting patient's functional outcome.   REHAB POTENTIAL: Fair    CLINICAL DECISION MAKING: Evolving/moderate complexity  EVALUATION COMPLEXITY: Moderate   GOALS: Goals reviewed with patient? Yes  SHORT TERM GOALS: Target date: 03/16/2023   Patient will be  independent with initial home program for SLS balance.  Baseline: provided at eval  Goal status: INITIAL   2.  Patient will demonstrate improved tandem stance to at least 25-30 sec in order to decrease risk of falls.  Baseline:  Balance Right Left   Tandem on floor 22s 20s   Goal status: INITIAL  3.  Patient will be able to complete 5x sit to stand in <14 seconds or less.  Baseline: 17 sec Goal status: INITIAL   LONG TERM GOALS: Target date: 04/13/2023   Patient will demonstrate improved SLS to at least 15-20 sec each LE in order to decrease risk of falls.   Baseline:  Balance Right Left   SLS on floor  4s 5s   Goal status: INITIAL  2.  Patient will demonstrate improved postural awareness for at least 15 minutes while standing without need for cueing from PT.   Baseline: see objective measures  Goal status: INITIAL   3.  Patient will demonstrate low fall risk as noted by TUG score.  Baseline: to be assessed at initial evaluation Goal status: INITIAL  4.  Patient will report ability to return to community ambulation including safe navigation of hills in order to return to normal exercise routine.  Baseline: unable to tolerate.  Goal status: INITIAL  5.  Patient will demonstrate ability to ascend and descend 6" step at least 10x without LOB and use of UE support as appropriate.  Baseline: severe difficulty related to visual deficits and LE weakness.  Goal status: INITIAL    PLAN:  PT FREQUENCY: 1-2x/week  PT DURATION: 8 weeks  PLANNED INTERVENTIONS: Therapeutic exercises, Therapeutic activity, Neuromuscular re-education, Balance training, Gait training, Patient/Family education, Self Care, Aquatic Therapy, Manual therapy, and Re-evaluation  PLAN FOR NEXT SESSION: Static & dynamic balance with focus on navigating narrow BOS, SLS, navigating stairs and inclined surfaces, weight bearing LE strengthening, navigating uneven surfaces   Fredderick Phenix, PT,  DPT 02/23/2023, 8:30 AM

## 2023-03-01 NOTE — Therapy (Signed)
Daily Note   Patient Name: John [name] MRN: [id number] DOB:May 17, 1936, 87 y.o., male Today's Date: 03/02/2023  END OF SESSION:  PT End of Session - 03/02/23 0829     Visit Number 3    Number of Visits 9    Date for PT Re-Evaluation 04/13/23    Authorization Type MCR A & B    PT Start Time 0830    PT Stop Time 0908    PT Time Calculation (min) 38 min    Activity Tolerance Patient tolerated treatment well    Behavior During Therapy WFL for tasks assessed/performed              Past Medical History:  Diagnosis Date   Anxiety    Atrial fibrillation (HCC)    CAD (coronary artery disease)    s/p CABG   Carotid stenosis    mild by doppler   Complication of anesthesia    zenkers  diverticulum   Deep vein thrombophlebitis of leg (HCC)    DJD (degenerative joint disease)    Elevated TSH    secondary to Amio-normal free T4.    Essential hypertension, benign    GERD (gastroesophageal reflux disease)    Hemorrhoids    Hiatal hernia    Hyperlipidemia    PAF (paroxysmal atrial fibrillation) (HCC)    Amiodarone   PE (pulmonary embolism)    pre 2007. Chronic anticoagulation   Prostate cancer (HCC)    Sebaceous cyst    neck   Seborrheic dermatitis    Tubular adenoma of colon 05/2011   Past Surgical History:  Procedure Laterality Date   bilateral shoulder surgery     CORONARY ARTERY BYPASS GRAFT     x2    2009   ESOPHAGOGASTRODUODENOSCOPY (EGD) WITH PROPOFOL N/A 01/24/2023   Procedure: ESOPHAGOGASTRODUODENOSCOPY (EGD) WITH PROPOFOL;  Surgeon: Napoleon Form, MD;  Location: [facility] ENDOSCOPY;  Service: Gastroenterology;  Laterality: N/A;   ESOPHAGOGASTRODUODENOSCOPY (EGD) WITH PROPOFOL N/A 01/26/2023   Procedure: ESOPHAGOGASTRODUODENOSCOPY (EGD) WITH PROPOFOL;  Surgeon: Jenel Lucks, MD;  Location: [facility] ENDOSCOPY;  Service: Gastroenterology;  Laterality: N/A;   I & D EXTREMITY  07/09/2011   Procedure: IRRIGATION AND DEBRIDEMENT EXTREMITY;  Surgeon: Tami Ribas;   Location: [facility];  Service: Orthopedics;  Laterality: Left;   left knee arthroscopy     MITRAL VALVE REPAIR     POLYPECTOMY  01/26/2023   Procedure: POLYPECTOMY;  Surgeon: Jenel Lucks, MD;  Location: [facility] ENDOSCOPY;  Service: Gastroenterology;;   prostate cancer surgery  11/1996   radial retropubic prostatectomy   SCLEROTHERAPY  01/26/2023   Procedure: SCLEROTHERAPY;  Surgeon: Jenel Lucks, MD;  Location: [facility] ENDOSCOPY;  Service: Gastroenterology;;  Epi prior to polyp removal   TANDEM AND OVOID INSERTION     VASECTOMY     ZENKER'S DIVERTICULECTOMY N/A 11/07/2012   Procedure: ZENKER'S DIVERTICULECTOMY ENDOSCOPIC;  Surgeon: Christia Reading, MD;  Location: [facility] OR;  Service: ENT;  Laterality: N/A;   Patient Active Problem List   Diagnosis Date Noted   Bleeding on Coumadin 01/25/2023   Gastric polyp 01/24/2023   GI bleed 01/22/2023   Epiretinal membrane (ERM) of right eye 04/16/2021   Cystoid macular edema of right eye 10/16/2019   Diplopia 10/16/2019   Retinal detachment of right eye with single break 10/16/2019   Pre-operative clearance 02/08/2018   Claudication in peripheral vascular disease (HCC) 01/14/2018   Femoral bruit 01/14/2018   Fatigue 01/14/2018   Elevated TSH  PAF (paroxysmal atrial fibrillation) (HCC)    H/O mitral valve repair 09/09/2017   Vitamin D deficiency 09/09/2017   Prostate cancer (HCC)    Hyperlipidemia    Hiatal hernia    GERD (gastroesophageal reflux disease)    DJD (degenerative joint disease)    Deep vein thrombophlebitis of leg (HCC)    Complication of anesthesia    Carotid stenosis    Anxiety    Shingles 09/09/2015   History of DVT (deep vein thrombosis) 09/21/2014   History of excision of Zenker's diverticulum 10/04/2013   Zenker's diverticulum 09/12/2012   Dysphagia 03/14/2012   Bronchitis, acute 10/30/2011   Colon polyps 09/07/2011   Tubular adenoma of colon 05/07/2011   Hx of CABG 03/17/2011   Panic anxiety  syndrome 03/10/2011   Long term (current) use of anticoagulants 08/31/2010   Mitral valve disorder 12/10/2009   History of pulmonary embolism 04/25/2009   DEEP VENOUS THROMBOPHLEBITIS 04/25/2009   HEMORRHOIDS 04/25/2009   GASTROESOPHAGEAL REFLUX DISEASE 04/25/2009   SEBORRHEIC DERMATITIS 04/25/2009   SEBACEOUS CYST, NECK 04/25/2009   Osteoarthritis 04/25/2009   Orthostatic hypotension 04/24/2009   NECK PAIN 04/24/2009   DIZZINESS 04/24/2009   Essential hypertension 01/10/2009   MITRAL VALVE REPLACEMENT, HX OF 01/10/2009   ATRIAL FIBRILLATION 10/25/2008    PCP: Merri Brunette, MD  REFERRING PROVIDER: Susa Griffins, MD  REFERRING DIAG: Muscle weakness (generalized) (M62.81);Unsteadiness on feet (R26.81)   THERAPY DIAG:  Other abnormalities of gait and mobility  Muscle weakness (generalized)  Rationale for Evaluation and Treatment: Rehabilitation  ONSET DATE: 01/22/23  SUBJECTIVE:   SUBJECTIVE STATEMENT: Patient reports that he had some dizziness on Sunday that prevented him from attending church, but that yesterday and today he has felt much better.   PERTINENT HISTORY: PMHx includes anxiety, atriall fibrillation, CAD with CABG, carotid stenosis, DVT, DJD, essential HTN, GERD, PAF, PE, prostate cancer, GI bleed, Diplopia, mitral valve repair, OA, orthostatic hypotension  PAIN:  Are you having pain? No  PRECAUTIONS: None  RED FLAGS: None   WEIGHT BEARING RESTRICTIONS: No  FALLS:  Has patient fallen in last 6 months? Yes. Number of falls 2 years, d/t syncope  LIVING ENVIRONMENT: Lives with: lives alone Lives in: House/apartment Stairs: Yes: External: 3 steps; none "I'm not using the stairs inside really, all the bedrooms are downstairs"  Has following equipment at home: None  OCCUPATION: retired  PLOF: Independent, Independent with basic ADLs, Independent with household mobility without device, Independent with homemaking with ambulation, and uses  assistance for   PATIENT GOALS: "I would like to be a walking a duration with heel navigation"   NEXT MD VISIT: 02/18/23  OBJECTIVE:   DIAGNOSTIC FINDINGS: n/a  PATIENT SURVEYS:  FOTO: 59 current, 66 predicted   COGNITION: Overall cognitive status: Within functional limits for tasks assessed     SENSATION: WFL  POSTURE: rounded shoulders, forward head, and swayback while standing   LOWER EXTREMITY MMT:  MMT Right eval Left eval  Hip flexion 5 5  Hip extension    Hip abduction 4+ 4+  Hip adduction 4+ 4+  Hip internal rotation    Hip external rotation    Knee flexion    Knee extension 5 5  Ankle dorsiflexion 5 5  Ankle plantarflexion    Ankle inversion    Ankle eversion     (Blank rows = not tested)  FUNCTIONAL TESTS:  5 times sit to stand: 17 sec   Norm for 80+ y.o. is <14 sec  Timed up and  go (TUG): not performed at eval 6 minute walk test: not performed at eval   GAIT: Distance walked: 50 Assistive device utilized: None Level of assistance: Complete Independence Comments: ambulates w/o AD, normal to wide BOS, slightly diminished gait speed.   BALANCE:   Balance    Romberg EO on floor  30s  Romberg EC on floor 30s  Romberg EO on airex 30s  Romberg EC on airex 30s with mod sway    Balance Right Left   Tandem on floor 22s 20s  SLS on floor  4s 5s      TODAY'S TREATMENT: [facility] Adult PT Treatment:                                                DATE: 03/02/23 Therapeutic Exercise: Nustep level 5 x 5 mins LTR x10 BIL Standing hip abduction 2x10 BIL Hip adduction squeeze - 2x10 Glute bridge - 2x10 Alternating march with YTB - 2x10 Knee ext machine - 10# - 2x10 Knee flexion machine - 20# - 2x10 STS 2x10 Neuromuscular re-ed: Semi tandem - 100% Semi tandem 50% on foam Lateral walking on airex beam   [facility] Adult PT Treatment: 8/20  Therapeutic Exercise:  LTR Hip adduction squeeze - 2x10 Glute bridge - 2x10 Alternating march with YTB -  2x10 Knee ext machine - 10# - 2x10 Knee flexion machine - 20# - 2x10  Neuromuscular re-ed: Semi tandem - 100% Semi tandem 50% on foam Lateral walking on airex beam (stopped d/t complaint of dizziness)                                                                                                                                 [facility] Adult PT Treatment:                                                DATE: 02/16/2023  See patient education and home exercise prescription as noted below.    PATIENT EDUCATION:  Education details: reviewed initial home exercise program; discussion of POC, prognosis and goals for skilled PT   Person educated: Patient Education method: Explanation, Demonstration, and Handouts Education comprehension: verbalized understanding, returned demonstration, and needs further education  HOME EXERCISE PROGRAM: Access Code: [email or web address] URL: [email or web address] Date: 02/16/2023 Prepared by: Mauri Reading  Exercises - Standing Marching  - 2 x daily - 7 x weekly - 1 sets - 10 reps - 10-20 sec hold  ASSESSMENT:  CLINICAL IMPRESSION: Patient presents to PT reporting continued HEP compliance and that he had some dizziness on Sunday but has felt better yesterday and today. He states he returns to the doctor this week. Session today continued to focus  on LE strengthening and balance tasks to decrease fall risk. Patient was able to tolerate all prescribed exercises with no adverse effects, no dizziness reported today. Patient continues to benefit from skilled PT services and should be progressed as able to improve functional independence.    OBJECTIVE IMPAIRMENTS: Abnormal gait, decreased activity tolerance, decreased balance, decreased endurance, decreased mobility, decreased strength, decreased safety awareness, postural dysfunction, and pain.   ACTIVITY LIMITATIONS: carrying, standing, stairs, transfers, dressing, and locomotion level  PARTICIPATION  LIMITATIONS: meal prep, cleaning, shopping, and community activity  PERSONAL FACTORS: Age, Past/current experiences, Time since onset of injury/illness/exacerbation, and 3+ comorbidities: PMHx includes anxiety, atriall fibrillation, CAD with CABG, carotid stenosis, DVT, DJD, essential HTN, GERD, PAF, PE, prostate cancer, GI bleed, Diplopia, mitral valve repair, OA, orthostatic hypotension  are also affecting patient's functional outcome.   REHAB POTENTIAL: Fair    CLINICAL DECISION MAKING: Evolving/moderate complexity  EVALUATION COMPLEXITY: Moderate   GOALS: Goals reviewed with patient? Yes  SHORT TERM GOALS: Target date: 03/16/2023   Patient will be independent with initial home program for SLS balance.  Baseline: provided at eval  Goal status: MET Pt reports adherence    2.  Patient will demonstrate improved tandem stance to at least 25-30 sec in order to decrease risk of falls.  Baseline:  Balance Right Left   Tandem on floor 22s 20s   Goal status: INITIAL  3.  Patient will be able to complete 5x sit to stand in <14 seconds or less.  Baseline: 17 sec Goal status: INITIAL   LONG TERM GOALS: Target date: 04/13/2023   Patient will demonstrate improved SLS to at least 15-20 sec each LE in order to decrease risk of falls.   Baseline:  Balance Right Left   SLS on floor  4s 5s   Goal status: INITIAL  2.  Patient will demonstrate improved postural awareness for at least 15 minutes while standing without need for cueing from PT.   Baseline: see objective measures  Goal status: INITIAL   3.  Patient will demonstrate low fall risk as noted by TUG score.  Baseline: to be assessed at initial evaluation Goal status: INITIAL  4.  Patient will report ability to return to community ambulation including safe navigation of hills in order to return to normal exercise routine.  Baseline: unable to tolerate.  Goal status: INITIAL  5.  Patient will demonstrate ability to ascend  and descend 6" step at least 10x without LOB and use of UE support as appropriate.  Baseline: severe difficulty related to visual deficits and LE weakness.  Goal status: INITIAL    PLAN:  PT FREQUENCY: 1-2x/week  PT DURATION: 8 weeks  PLANNED INTERVENTIONS: Therapeutic exercises, Therapeutic activity, Neuromuscular re-education, Balance training, Gait training, Patient/Family education, Self Care, Aquatic Therapy, Manual therapy, and Re-evaluation  PLAN FOR NEXT SESSION: Static & dynamic balance with focus on navigating narrow BOS, SLS, navigating stairs and inclined surfaces, weight bearing LE strengthening, navigating uneven surfaces   Berta Minor, PTA 03/02/2023, 9:08 AM

## 2023-03-02 ENCOUNTER — Ambulatory Visit: Payer: Medicare Other

## 2023-03-02 DIAGNOSIS — M6281 Muscle weakness (generalized): Secondary | ICD-10-CM | POA: Diagnosis not present

## 2023-03-02 DIAGNOSIS — R2689 Other abnormalities of gait and mobility: Secondary | ICD-10-CM

## 2023-03-03 DIAGNOSIS — Z8719 Personal history of other diseases of the digestive system: Secondary | ICD-10-CM | POA: Diagnosis not present

## 2023-03-04 ENCOUNTER — Encounter (HOSPITAL_COMMUNITY): Payer: Medicare Other

## 2023-03-05 ENCOUNTER — Other Ambulatory Visit (HOSPITAL_COMMUNITY): Payer: Medicare Other

## 2023-03-05 ENCOUNTER — Ambulatory Visit (INDEPENDENT_AMBULATORY_CARE_PROVIDER_SITE_OTHER): Payer: Medicare Other

## 2023-03-05 VITALS — BP 178/67 | HR 74 | Temp 97.9°F | Resp 18 | Ht 69.0 in | Wt 164.2 lb

## 2023-03-05 DIAGNOSIS — E7800 Pure hypercholesterolemia, unspecified: Secondary | ICD-10-CM

## 2023-03-05 DIAGNOSIS — Z8719 Personal history of other diseases of the digestive system: Secondary | ICD-10-CM | POA: Diagnosis not present

## 2023-03-05 DIAGNOSIS — N1832 Chronic kidney disease, stage 3b: Secondary | ICD-10-CM | POA: Diagnosis not present

## 2023-03-05 DIAGNOSIS — Z7901 Long term (current) use of anticoagulants: Secondary | ICD-10-CM | POA: Diagnosis not present

## 2023-03-05 DIAGNOSIS — E039 Hypothyroidism, unspecified: Secondary | ICD-10-CM | POA: Diagnosis not present

## 2023-03-05 DIAGNOSIS — I739 Peripheral vascular disease, unspecified: Secondary | ICD-10-CM

## 2023-03-05 DIAGNOSIS — Z23 Encounter for immunization: Secondary | ICD-10-CM | POA: Diagnosis not present

## 2023-03-05 MED ORDER — INCLISIRAN SODIUM 284 MG/1.5ML ~~LOC~~ SOSY
284.0000 mg | PREFILLED_SYRINGE | Freq: Once | SUBCUTANEOUS | Status: AC
  Administered 2023-03-05: 284 mg via SUBCUTANEOUS
  Filled 2023-03-05: qty 1.5

## 2023-03-05 NOTE — Progress Notes (Signed)
Diagnosis: Hyperlipidemia  Provider:  Mannam, Praveen MD  Procedure: Injection  Leqvio (inclisiran), Dose: 284 mg, Site: subcutaneous, Number of injections: 1  Post Care: Patient declined observation  Discharge: Condition: Good, Destination: Home . AVS Declined  Performed by:  Sunita Ranabhat, RN       

## 2023-03-09 ENCOUNTER — Ambulatory Visit: Payer: Medicare Other | Attending: Internal Medicine

## 2023-03-09 DIAGNOSIS — M6281 Muscle weakness (generalized): Secondary | ICD-10-CM | POA: Diagnosis not present

## 2023-03-09 DIAGNOSIS — R2689 Other abnormalities of gait and mobility: Secondary | ICD-10-CM | POA: Insufficient documentation

## 2023-03-09 NOTE — Therapy (Signed)
Daily Note   Patient Name: John [name] MRN: [id number] DOB:December 07, 1935, 87 y.o., male Today's Date: 03/09/2023  END OF SESSION:  PT End of Session - 03/09/23 0821     Visit Number 4    Number of Visits 9    Date for PT Re-Evaluation 04/13/23    Authorization Type MCR A & B    PT Start Time 0830    PT Stop Time 0910    PT Time Calculation (min) 40 min    Activity Tolerance Patient tolerated treatment well    Behavior During Therapy WFL for tasks assessed/performed              Past Medical History:  Diagnosis Date   Anxiety    Atrial fibrillation (HCC)    CAD (coronary artery disease)    s/p CABG   Carotid stenosis    mild by doppler   Complication of anesthesia    zenkers  diverticulum   Deep vein thrombophlebitis of leg (HCC)    DJD (degenerative joint disease)    Elevated TSH    secondary to Amio-normal free T4.    Essential hypertension, benign    GERD (gastroesophageal reflux disease)    Hemorrhoids    Hiatal hernia    Hyperlipidemia    PAF (paroxysmal atrial fibrillation) (HCC)    Amiodarone   PE (pulmonary embolism)    pre 2007. Chronic anticoagulation   Prostate cancer (HCC)    Sebaceous cyst    neck   Seborrheic dermatitis    Tubular adenoma of colon 05/2011   Past Surgical History:  Procedure Laterality Date   bilateral shoulder surgery     CORONARY ARTERY BYPASS GRAFT     x2    2009   ESOPHAGOGASTRODUODENOSCOPY (EGD) WITH PROPOFOL N/A 01/24/2023   Procedure: ESOPHAGOGASTRODUODENOSCOPY (EGD) WITH PROPOFOL;  Surgeon: Napoleon Form, MD;  Location: [facility];  Service: Gastroenterology;  Laterality: N/A;   ESOPHAGOGASTRODUODENOSCOPY (EGD) WITH PROPOFOL N/A 01/26/2023   Procedure: ESOPHAGOGASTRODUODENOSCOPY (EGD) WITH PROPOFOL;  Surgeon: Jenel Lucks, MD;  Location: [facility] ENDOSCOPY;  Service: Gastroenterology;  Laterality: N/A;   I & D EXTREMITY  07/09/2011   Procedure: IRRIGATION AND DEBRIDEMENT EXTREMITY;  Surgeon: Tami Ribas;   Location: [facility];  Service: Orthopedics;  Laterality: Left;   left knee arthroscopy     MITRAL VALVE REPAIR     POLYPECTOMY  01/26/2023   Procedure: POLYPECTOMY;  Surgeon: Jenel Lucks, MD;  Location: [facility] ENDOSCOPY;  Service: Gastroenterology;;   prostate cancer surgery  11/1996   radial retropubic prostatectomy   SCLEROTHERAPY  01/26/2023   Procedure: SCLEROTHERAPY;  Surgeon: Jenel Lucks, MD;  Location: [facility] ENDOSCOPY;  Service: Gastroenterology;;  Epi prior to polyp removal   TANDEM AND OVOID INSERTION     VASECTOMY     ZENKER'S DIVERTICULECTOMY N/A 11/07/2012   Procedure: ZENKER'S DIVERTICULECTOMY ENDOSCOPIC;  Surgeon: Christia Reading, MD;  Location: [facility] OR;  Service: ENT;  Laterality: N/A;   Patient Active Problem List   Diagnosis Date Noted   Bleeding on Coumadin 01/25/2023   Gastric polyp 01/24/2023   GI bleed 01/22/2023   Epiretinal membrane (ERM) of right eye 04/16/2021   Cystoid macular edema of right eye 10/16/2019   Diplopia 10/16/2019   Retinal detachment of right eye with single break 10/16/2019   Pre-operative clearance 02/08/2018   Claudication in peripheral vascular disease (HCC) 01/14/2018   Femoral bruit 01/14/2018   Fatigue 01/14/2018   Elevated TSH  PAF (paroxysmal atrial fibrillation) (HCC)    H/O mitral valve repair 09/09/2017   Vitamin D deficiency 09/09/2017   Prostate cancer (HCC)    Hyperlipidemia    Hiatal hernia    GERD (gastroesophageal reflux disease)    DJD (degenerative joint disease)    Deep vein thrombophlebitis of leg (HCC)    Complication of anesthesia    Carotid stenosis    Anxiety    Shingles 09/09/2015   History of DVT (deep vein thrombosis) 09/21/2014   History of excision of Zenker's diverticulum 10/04/2013   Zenker's diverticulum 09/12/2012   Dysphagia 03/14/2012   Bronchitis, acute 10/30/2011   Colon polyps 09/07/2011   Tubular adenoma of colon 05/07/2011   Hx of CABG 03/17/2011   Panic anxiety  syndrome 03/10/2011   Long term (current) use of anticoagulants 08/31/2010   Mitral valve disorder 12/10/2009   History of pulmonary embolism 04/25/2009   DEEP VENOUS THROMBOPHLEBITIS 04/25/2009   HEMORRHOIDS 04/25/2009   GASTROESOPHAGEAL REFLUX DISEASE 04/25/2009   SEBORRHEIC DERMATITIS 04/25/2009   SEBACEOUS CYST, NECK 04/25/2009   Osteoarthritis 04/25/2009   Orthostatic hypotension 04/24/2009   NECK PAIN 04/24/2009   DIZZINESS 04/24/2009   Essential hypertension 01/10/2009   MITRAL VALVE REPLACEMENT, HX OF 01/10/2009   ATRIAL FIBRILLATION 10/25/2008    PCP: Merri Brunette, MD  REFERRING PROVIDER: Susa Griffins, MD  REFERRING DIAG: Muscle weakness (generalized) (M62.81);Unsteadiness on feet (R26.81)   THERAPY DIAG:  No diagnosis found.  Rationale for Evaluation and Treatment: Rehabilitation  ONSET DATE: 01/22/23  SUBJECTIVE:   SUBJECTIVE STATEMENT: Patient reports that he is having some dizziness related to his Rt eye. "I see the eye doctor soon." He states that he is able to walk 1/2 mile, but wants to get back to his 5 mile baseline as of 2-3 years ago.   PERTINENT HISTORY: PMHx includes anxiety, atrial fibrillation, CAD with CABG, carotid stenosis, DVT, DJD, essential HTN, GERD, PAF, PE, prostate cancer, GI bleed, Diplopia, mitral valve repair, OA, orthostatic hypotension  PAIN:  Are you having pain? No  PRECAUTIONS: None  RED FLAGS: None   WEIGHT BEARING RESTRICTIONS: No  FALLS:  Has patient fallen in last 6 months? Yes. Number of falls 2 years, d/t syncope  LIVING ENVIRONMENT: Lives with: lives alone Lives in: House/apartment Stairs: Yes: External: 3 steps; none "I'm not using the stairs inside really, all the bedrooms are downstairs"  Has following equipment at home: None  OCCUPATION: retired  PLOF: Independent, Independent with basic ADLs, Independent with household mobility without device, Independent with homemaking with ambulation, and uses  assistance for   PATIENT GOALS: "I would like to be a walking a duration with heel navigation"   NEXT MD VISIT: 02/18/23  OBJECTIVE:   DIAGNOSTIC FINDINGS: n/a  PATIENT SURVEYS:  FOTO: 59 current, 73 predicted   COGNITION: Overall cognitive status: Within functional limits for tasks assessed     SENSATION: WFL  POSTURE: rounded shoulders, forward head, and swayback while standing   LOWER EXTREMITY MMT:  MMT Right eval Left eval  Hip flexion 5 5  Hip extension    Hip abduction 4+ 4+  Hip adduction 4+ 4+  Hip internal rotation    Hip external rotation    Knee flexion    Knee extension 5 5  Ankle dorsiflexion 5 5  Ankle plantarflexion    Ankle inversion    Ankle eversion     (Blank rows = not tested)  FUNCTIONAL TESTS:  5 times sit to stand: 17 sec  Norm for 80+ y.o. is <14 sec  Timed up and go (TUG): not performed at eval 6 minute walk test: not performed at eval   GAIT: Distance walked: 50 Assistive device utilized: None Level of assistance: Complete Independence Comments: ambulates w/o AD, normal to wide BOS, slightly diminished gait speed.   BALANCE:   Balance    Romberg EO on floor  30s  Romberg EC on floor 30s  Romberg EO on airex 30s  Romberg EC on airex 30s with mod sway    Balance Right Left   Tandem on floor 22s 20s  SLS on floor  4s 5s      TODAY'S TREATMENT:  [facility] Adult PT Treatment:                                                DATE: 03/09/23 Work on step downs fwd/lateral at next visit  Therapeutic Exercise: Nustep level 5 x 5 mins  Standing hip abduction 2x10 BIL Hip adduction squeeze - 2x10 Glute bridge - 2x10 Alternating march with YTB - 2x10  Knee ext machine - 15# - 2x10 Knee flexion machine - 25# - 2x10 STS 2x10, second set on airex   Neuromuscular re-ed: Semi tandem - 100% Semi tandem 50% on foam Lateral walking on airex beam   [facility] Adult PT Treatment:                                                DATE:  03/02/23 Therapeutic Exercise: Nustep level 5 x 5 mins LTR x10 BIL Standing hip abduction 2x10 BIL Hip adduction squeeze - 2x10 Glute bridge - 2x10 Alternating march with YTB - 2x10 Knee ext machine - 10# - 2x10 Knee flexion machine - 20# - 2x10 STS 2x10 Neuromuscular re-ed: Semi tandem - 100% Semi tandem 50% on foam Lateral walking on airex beam   [facility] Adult PT Treatment: 8/20  Therapeutic Exercise: LTR Hip adduction squeeze - 2x10 Glute bridge - 2x10 Alternating march with YTB - 2x10 Knee ext machine - 10# - 2x10 Knee flexion machine - 20# - 2x10  Neuromuscular re-ed: Semi tandem - 100% Semi tandem 50% on foam Lateral walking on airex beam (stopped d/t complaint of dizziness)                                                                                                                                 [facility] Adult PT Treatment:  DATE: 02/16/2023  See patient education and home exercise prescription as noted below.    PATIENT EDUCATION:  Education details: reviewed initial home exercise program; discussion of POC, prognosis and goals for skilled PT   Person educated: Patient Education method: Explanation, Demonstration, and Handouts Education comprehension: verbalized understanding, returned demonstration, and needs further education  HOME EXERCISE PROGRAM: Access Code: [email or web address] URL: [email or web address] Date: 03/09/2023 Prepared by: Mauri Reading  Exercises - Hooklying Clamshell with Resistance  - 1 x daily - 7 x weekly - 3 sets - 10 reps - Supine Hip Adduction Isometric with Ball  - 1 x daily - 7 x weekly - 2 sets - 10 reps - 10'' hold - Supine March with Resistance Band  - 1 x daily - 7 x weekly - 2 sets - 10 reps - Supine Bridge  - 1 x daily - 7 x weekly - 2 sets - 10 reps - 2-3 seconds hold - Standing Hip Extension with Counter Support  - 1 x daily - 7 x weekly - 2 sets - 10 reps - Standing Hip  Abduction with Counter Support  - 1 x daily - 7 x weekly - 2 sets - 10 reps - Standing March with Counter Support  - 1 x daily - 7 x weekly - 2 sets - 10 reps  ASSESSMENT:  CLINICAL IMPRESSION: Peter tolerated today's session well today. Updated HEP with additional LE strengthening activities at end of session. Patient would like to continue working on stair climbing, navigating hills, and walking up to 5 miles safely.    OBJECTIVE IMPAIRMENTS: Abnormal gait, decreased activity tolerance, decreased balance, decreased endurance, decreased mobility, decreased strength, decreased safety awareness, postural dysfunction, and pain.   ACTIVITY LIMITATIONS: carrying, standing, stairs, transfers, dressing, and locomotion level  PARTICIPATION LIMITATIONS: meal prep, cleaning, shopping, and community activity  PERSONAL FACTORS: Age, Past/current experiences, Time since onset of injury/illness/exacerbation, and 3+ comorbidities: PMHx includes anxiety, atriall fibrillation, CAD with CABG, carotid stenosis, DVT, DJD, essential HTN, GERD, PAF, PE, prostate cancer, GI bleed, Diplopia, mitral valve repair, OA, orthostatic hypotension  are also affecting patient's functional outcome.   REHAB POTENTIAL: Fair    CLINICAL DECISION MAKING: Evolving/moderate complexity  EVALUATION COMPLEXITY: Moderate   GOALS: Goals reviewed with patient? Yes  SHORT TERM GOALS: Target date: 03/16/2023   Patient will be independent with initial home program for SLS balance.  Baseline: provided at eval  Goal status: MET Pt reports adherence    2.  Patient will demonstrate improved tandem stance to at least 25-30 sec in order to decrease risk of falls.  Baseline:  Balance Right Left   Tandem on floor 22s 20s   Goal status: INITIAL  3.  Patient will be able to complete 5x sit to stand in <14 seconds or less.  Baseline: 17 sec Goal status: INITIAL   LONG TERM GOALS: Target date: 04/13/2023   Patient will  demonstrate improved SLS to at least 15-20 sec each LE in order to decrease risk of falls.   Baseline:  Balance Right Left   SLS on floor  4s 5s   Goal status: INITIAL  2.  Patient will demonstrate improved postural awareness for at least 15 minutes while standing without need for cueing from PT.   Baseline: see objective measures  Goal status: INITIAL   3.  Patient will demonstrate low fall risk as noted by TUG score.  Baseline: to be assessed at initial evaluation Goal status: INITIAL  4.  Patient will report  ability to return to community ambulation including safe navigation of hills in order to return to normal exercise routine.  Baseline: unable to tolerate.  Goal status: INITIAL  5.  Patient will demonstrate ability to ascend and descend 6" step at least 10x without LOB and use of UE support as appropriate.  Baseline: severe difficulty related to visual deficits and LE weakness.  Goal status: INITIAL    PLAN:  PT FREQUENCY: 1-2x/week  PT DURATION: 8 weeks  PLANNED INTERVENTIONS: Therapeutic exercises, Therapeutic activity, Neuromuscular re-education, Balance training, Gait training, Patient/Family education, Self Care, Aquatic Therapy, Manual therapy, and Re-evaluation  PLAN FOR NEXT SESSION: Static & dynamic balance with focus on navigating narrow BOS, SLS, navigating stairs and inclined surfaces, weight bearing LE strengthening, navigating uneven surfaces   Mauri Reading, PT, DPT 03/09/2023, 12:54 PM

## 2023-03-10 ENCOUNTER — Ambulatory Visit (HOSPITAL_COMMUNITY): Payer: Medicare Other | Attending: Student

## 2023-03-10 DIAGNOSIS — Z9889 Other specified postprocedural states: Secondary | ICD-10-CM | POA: Insufficient documentation

## 2023-03-10 DIAGNOSIS — I059 Rheumatic mitral valve disease, unspecified: Secondary | ICD-10-CM | POA: Insufficient documentation

## 2023-03-10 LAB — ECHOCARDIOGRAM COMPLETE
Area-P 1/2: 2.34 cm2
MV VTI: 1.22 cm2
P 1/2 time: 443 ms
S' Lateral: 2.5 cm

## 2023-03-12 DIAGNOSIS — H532 Diplopia: Secondary | ICD-10-CM | POA: Diagnosis not present

## 2023-03-12 DIAGNOSIS — Z961 Presence of intraocular lens: Secondary | ICD-10-CM | POA: Diagnosis not present

## 2023-03-12 DIAGNOSIS — H524 Presbyopia: Secondary | ICD-10-CM | POA: Diagnosis not present

## 2023-03-12 DIAGNOSIS — H31001 Unspecified chorioretinal scars, right eye: Secondary | ICD-10-CM | POA: Diagnosis not present

## 2023-03-14 ENCOUNTER — Other Ambulatory Visit: Payer: Self-pay | Admitting: Cardiology

## 2023-03-14 DIAGNOSIS — I10 Essential (primary) hypertension: Secondary | ICD-10-CM

## 2023-03-16 ENCOUNTER — Ambulatory Visit: Payer: Medicare Other

## 2023-03-16 DIAGNOSIS — M6281 Muscle weakness (generalized): Secondary | ICD-10-CM | POA: Diagnosis not present

## 2023-03-16 DIAGNOSIS — R748 Abnormal levels of other serum enzymes: Secondary | ICD-10-CM | POA: Diagnosis not present

## 2023-03-16 DIAGNOSIS — R2689 Other abnormalities of gait and mobility: Secondary | ICD-10-CM

## 2023-03-16 NOTE — Therapy (Signed)
Daily Note   Patient Name: John [name] MRN: [id number] DOB:October 23, 1935, 87 y.o., male Today's Date: 03/16/2023  END OF SESSION:  PT End of Session - 03/16/23 0913     Visit Number 5    Number of Visits 9    Date for PT Re-Evaluation 04/13/23    Authorization Type MCR A & B    PT Start Time 0914    PT Stop Time 0950    PT Time Calculation (min) 36 min    Activity Tolerance Patient tolerated treatment well    Behavior During Therapy WFL for tasks assessed/performed               Past Medical History:  Diagnosis Date   Anxiety    Atrial fibrillation (HCC)    CAD (coronary artery disease)    s/p CABG   Carotid stenosis    mild by doppler   Complication of anesthesia    zenkers  diverticulum   Deep vein thrombophlebitis of leg (HCC)    DJD (degenerative joint disease)    Elevated TSH    secondary to Amio-normal free T4.    Essential hypertension, benign    GERD (gastroesophageal reflux disease)    Hemorrhoids    Hiatal hernia    Hyperlipidemia    PAF (paroxysmal atrial fibrillation) (HCC)    Amiodarone   PE (pulmonary embolism)    pre 2007. Chronic anticoagulation   Prostate cancer (HCC)    Sebaceous cyst    neck   Seborrheic dermatitis    Tubular adenoma of colon 05/2011   Past Surgical History:  Procedure Laterality Date   bilateral shoulder surgery     CORONARY ARTERY BYPASS GRAFT     x2    2009   ESOPHAGOGASTRODUODENOSCOPY (EGD) WITH PROPOFOL N/A 01/24/2023   Procedure: ESOPHAGOGASTRODUODENOSCOPY (EGD) WITH PROPOFOL;  Surgeon: Napoleon Form, MD;  Location: [facility] END[facility];  Service: Gastroenterology;  Laterality: N/A;   ESOPHAGOGASTRODUODENOSCOPY (EGD) WITH PROPOFOL N/A 01/26/2023   Procedure: ESOPHAGOGASTRODUODENOSCOPY (EGD) WITH PROPOFOL;  Surgeon: Jenel Lucks, MD;  Location: [facility] ENDOSCOPY;  Service: Gastroenterology;  Laterality: N/A;   I & D EXTREMITY  07/09/2011   Procedure: IRRIGATION AND DEBRIDEMENT EXTREMITY;  Surgeon: Tami Ribas;   Location: [facility];  Service: Orthopedics;  Laterality: Left;   left knee arthroscopy     MITRAL VALVE REPAIR     POLYPECTOMY  01/26/2023   Procedure: POLYPECTOMY;  Surgeon: Jenel Lucks, MD;  Location: [facility] ENDOSCOPY;  Service: Gastroenterology;;   prostate cancer surgery  11/1996   radial retropubic prostatectomy   SCLEROTHERAPY  01/26/2023   Procedure: SCLEROTHERAPY;  Surgeon: Jenel Lucks, MD;  Location: [facility] ENDOSCOPY;  Service: Gastroenterology;;  Epi prior to polyp removal   TANDEM AND OVOID INSERTION     VASECTOMY     ZENKER'S DIVERTICULECTOMY N/A 11/07/2012   Procedure: ZENKER'S DIVERTICULECTOMY ENDOSCOPIC;  Surgeon: Christia Reading, MD;  Location: [facility] OR;  Service: ENT;  Laterality: N/A;   Patient Active Problem List   Diagnosis Date Noted   Bleeding on Coumadin 01/25/2023   Gastric polyp 01/24/2023   GI bleed 01/22/2023   Epiretinal membrane (ERM) of right eye 04/16/2021   Cystoid macular edema of right eye 10/16/2019   Diplopia 10/16/2019   Retinal detachment of right eye with single break 10/16/2019   Pre-operative clearance 02/08/2018   Claudication in peripheral vascular disease (HCC) 01/14/2018   Femoral bruit 01/14/2018   Fatigue 01/14/2018   Elevated TSH  PAF (paroxysmal atrial fibrillation) (HCC)    H/O mitral valve repair 09/09/2017   Vitamin D deficiency 09/09/2017   Prostate cancer (HCC)    Hyperlipidemia    Hiatal hernia    GERD (gastroesophageal reflux disease)    DJD (degenerative joint disease)    Deep vein thrombophlebitis of leg (HCC)    Complication of anesthesia    Carotid stenosis    Anxiety    Shingles 09/09/2015   History of DVT (deep vein thrombosis) 09/21/2014   History of excision of Zenker's diverticulum 10/04/2013   Zenker's diverticulum 09/12/2012   Dysphagia 03/14/2012   Bronchitis, acute 10/30/2011   Colon polyps 09/07/2011   Tubular adenoma of colon 05/07/2011   Hx of CABG 03/17/2011   Panic anxiety  syndrome 03/10/2011   Long term (current) use of anticoagulants 08/31/2010   Mitral valve disorder 12/10/2009   History of pulmonary embolism 04/25/2009   DEEP VENOUS THROMBOPHLEBITIS 04/25/2009   HEMORRHOIDS 04/25/2009   GASTROESOPHAGEAL REFLUX DISEASE 04/25/2009   SEBORRHEIC DERMATITIS 04/25/2009   SEBACEOUS CYST, NECK 04/25/2009   Osteoarthritis 04/25/2009   Orthostatic hypotension 04/24/2009   NECK PAIN 04/24/2009   DIZZINESS 04/24/2009   Essential hypertension 01/10/2009   MITRAL VALVE REPLACEMENT, HX OF 01/10/2009   ATRIAL FIBRILLATION 10/25/2008    PCP: Merri Brunette, MD  REFERRING PROVIDER: Susa Griffins, MD  REFERRING DIAG: Muscle weakness (generalized) (M62.81);Unsteadiness on feet (R26.81)   THERAPY DIAG:  Other abnormalities of gait and mobility  Muscle weakness (generalized)  Rationale for Evaluation and Treatment: Rehabilitation  ONSET DATE: 01/22/23  SUBJECTIVE:   SUBJECTIVE STATEMENT: Patient states that he hasn't had any dizziness this week. He also states that he had to renew his license yesterday and ended up standing for about 5 hours. His legs are tired/shaky today.    PERTINENT HISTORY: PMHx includes anxiety, atrial fibrillation, CAD with CABG, carotid stenosis, DVT, DJD, essential HTN, GERD, PAF, PE, prostate cancer, GI bleed, Diplopia, mitral valve repair, OA, orthostatic hypotension  PAIN:  Are you having pain? No  PRECAUTIONS: None  RED FLAGS: None   WEIGHT BEARING RESTRICTIONS: No  FALLS:  Has patient fallen in last 6 months? Yes. Number of falls 2 years, d/t syncope  LIVING ENVIRONMENT: Lives with: lives alone Lives in: House/apartment Stairs: Yes: External: 3 steps; none "I'm not using the stairs inside really, all the bedrooms are downstairs"  Has following equipment at home: None  OCCUPATION: retired  PLOF: Independent, Independent with basic ADLs, Independent with household mobility without device, Independent with  homemaking with ambulation, and uses assistance for   PATIENT GOALS: "I would like to be a walking a duration with heel navigation"   NEXT MD VISIT: 02/18/23  OBJECTIVE:   DIAGNOSTIC FINDINGS: n/a  PATIENT SURVEYS:  FOTO: 59 current, 57 predicted   COGNITION: Overall cognitive status: Within functional limits for tasks assessed     SENSATION: WFL  POSTURE: rounded shoulders, forward head, and swayback while standing   LOWER EXTREMITY MMT:  MMT Right eval Left eval  Hip flexion 5 5  Hip extension    Hip abduction 4+ 4+  Hip adduction 4+ 4+  Hip internal rotation    Hip external rotation    Knee flexion    Knee extension 5 5  Ankle dorsiflexion 5 5  Ankle plantarflexion    Ankle inversion    Ankle eversion     (Blank rows = not tested)  FUNCTIONAL TESTS:  5 times sit to stand: 17 sec   Norm  for 75+ y.o. is <14 sec  Timed up and go (TUG): not performed at eval 6 minute walk test: not performed at eval   GAIT: Distance walked: 50 Assistive device utilized: None Level of assistance: Complete Independence Comments: ambulates w/o AD, normal to wide BOS, slightly diminished gait speed.   BALANCE:   Balance    Romberg EO on floor  30s  Romberg EC on floor 30s  Romberg EO on airex 30s  Romberg EC on airex 30s with mod sway    Balance Right Left   Tandem on floor 22s 20s  SLS on floor  4s 5s      TODAY'S TREATMENT:  [facility] Adult PT Treatment:                                                DATE: 03/16/2023  Therapeutic Exercise: Treadmill x 3 minutes, incline 2-4%, speed .8-1.2 Standing hip abduction 2x10 BIL Seated Hip adduction squeeze - 2x10, 3 sec hold  Alternating march with YTB - 2x10 Knee ext machine - 15# - 2x15 Knee flexion machine - 25# - 2x15 STS x5 see STG for outcome & retest at next visit  Neuromuscular re-ed: Semi tandem on foam Tandem on floor   Consider working on step downs (forward/lat) at next visit   [facility] Adult PT  Treatment:                                                DATE: 03/09/23 Work on step downs fwd/lateral at next visit  Therapeutic Exercise: Nustep level 5 x 5 mins  Standing hip abduction 2x10 BIL Hip adduction squeeze - 2x10 Glute bridge - 2x10 Alternating march with YTB - 2x10  Knee ext machine - 15# - 2x10 Knee flexion machine - 25# - 2x10 STS 2x10, second set on airex   Neuromuscular re-ed: Semi tandem - 100% Semi tandem 50% on foam Lateral walking on airex beam   [facility] Adult PT Treatment:                                                DATE: 03/02/23 Therapeutic Exercise: Nustep level 5 x 5 mins LTR x10 BIL Standing hip abduction 2x10 BIL Hip adduction squeeze - 2x10 Glute bridge - 2x10 Alternating march with YTB - 2x10 Knee ext machine - 10# - 2x10 Knee flexion machine - 20# - 2x10 STS 2x10 Neuromuscular re-ed: Semi tandem - 100% Semi tandem 50% on foam Lateral walking on airex beam  PATIENT EDUCATION:  Education details: reviewed initial home exercise program; discussion of POC, prognosis and goals for skilled PT   Person educated: Patient Education method: Explanation, Demonstration, and Handouts Education comprehension: verbalized understanding, returned demonstration, and needs further education  HOME EXERCISE PROGRAM: Access Code: [email or web address] URL: [email or web address] Date: 03/09/2023 Prepared by: Mauri Reading  Exercises - Hooklying Clamshell with Resistance  - 1 x daily - 7 x weekly - 3 sets - 10 reps - Supine Hip Adduction Isometric with Ball  - 1 x daily - 7 x weekly - 2 sets - 10 reps - 10'' hold - Supine March with Resistance Band  - 1 x daily - 7 x weekly - 2 sets - 10 reps - Supine Bridge  - 1 x daily - 7 x weekly - 2 sets - 10 reps - 2-3 seconds hold - Standing Hip Extension with Counter Support  - 1 x daily  - 7 x weekly - 2 sets - 10 reps - Standing Hip Abduction with Counter Support  - 1 x daily - 7 x weekly - 2 sets - 10 reps - Standing March with Counter Support  - 1 x daily - 7 x weekly - 2 sets - 10 reps  ASSESSMENT:  CLINICAL IMPRESSION: John [name] was able to tolerate today's session without LOB. However, he had difficulty with balance activities d/t increased LE fatigue today. He is doing better with tandem stance today and was able to increase repetitions of resisted knee flexion/extension. We were unable to begin stair/hill navigation today, but plan to resume at next visit.     OBJECTIVE IMPAIRMENTS: Abnormal gait, decreased activity tolerance, decreased balance, decreased endurance, decreased mobility, decreased strength, decreased safety awareness, postural dysfunction, and pain.   ACTIVITY LIMITATIONS: carrying, standing, stairs, transfers, dressing, and locomotion level  PARTICIPATION LIMITATIONS: meal prep, cleaning, shopping, and community activity  PERSONAL FACTORS: Age, Past/current experiences, Time since onset of injury/illness/exacerbation, and 3+ comorbidities: PMHx includes anxiety, atriall fibrillation, CAD with CABG, carotid stenosis, DVT, DJD, essential HTN, GERD, PAF, PE, prostate cancer, GI bleed, Diplopia, mitral valve repair, OA, orthostatic hypotension  are also affecting patient's functional outcome.   REHAB POTENTIAL: Fair    CLINICAL DECISION MAKING: Evolving/moderate complexity  EVALUATION COMPLEXITY: Moderate   GOALS: Goals reviewed with patient? Yes  SHORT TERM GOALS: Target date: 03/16/2023   Patient will be independent with initial home program for SLS balance.  Baseline: provided at eval  Goal status: MET Pt reports adherence    2.  Patient will demonstrate improved tandem stance to at least 25-30 sec in order to decrease risk of falls.  Baseline:  Balance Right Left   Tandem on floor 30s 26s   Goal status: MET  3.  Patient will be able to  complete 5x sit to stand in <14 seconds or less.  Baseline: 17 sec 03/16/23: 21 sec d/t muscle fatigue and unsteadiness as noted in subjective statement  Goal status: ONGOING    LONG TERM GOALS: Target date: 04/13/2023   Patient will demonstrate improved SLS to at least 15-20 sec each LE in order to decrease risk of falls.   Baseline:  Balance Right Left   SLS on floor  4s 5s   Goal status: INITIAL  2.  Patient will demonstrate improved postural awareness for at least 15 minutes while standing without need for cueing from PT.   Baseline: see objective measures  Goal status: INITIAL   3.  Patient will demonstrate low fall risk as noted by  TUG score.  Baseline: to be assessed at initial evaluation Goal status: INITIAL  4.  Patient will report ability to return to community ambulation including safe navigation of hills in order to return to normal exercise routine.  Baseline: unable to tolerate.  Goal status: INITIAL  5.  Patient will demonstrate ability to ascend and descend 6" step at least 10x without LOB and use of UE support as appropriate.  Baseline: severe difficulty related to visual deficits and LE weakness.  Goal status: INITIAL    PLAN:  PT FREQUENCY: 1-2x/week  PT DURATION: 8 weeks  PLANNED INTERVENTIONS: Therapeutic exercises, Therapeutic activity, Neuromuscular re-education, Balance training, Gait training, Patient/Family education, Self Care, Aquatic Therapy, Manual therapy, and Re-evaluation  PLAN FOR NEXT SESSION: Static & dynamic balance with focus on navigating narrow BOS, SLS, navigating stairs and inclined surfaces, weight bearing LE strengthening, navigating uneven surfaces   Mauri Reading, PT, DPT 03/16/2023, 9:55 AM

## 2023-03-18 ENCOUNTER — Ambulatory Visit: Payer: Medicare Other | Attending: Internal Medicine

## 2023-03-18 DIAGNOSIS — Z5181 Encounter for therapeutic drug level monitoring: Secondary | ICD-10-CM | POA: Diagnosis not present

## 2023-03-18 DIAGNOSIS — I480 Paroxysmal atrial fibrillation: Secondary | ICD-10-CM | POA: Diagnosis not present

## 2023-03-18 LAB — POCT INR: INR: 2.3 (ref 2.0–3.0)

## 2023-03-18 NOTE — Patient Instructions (Signed)
Description   Continue taking warfarin 1.5 tablets daily except on Sundays and Thursdays take 2 tablets.  Recheck INR in 6 weeks.  [facility] [phone]

## 2023-03-23 ENCOUNTER — Ambulatory Visit: Payer: Medicare Other

## 2023-03-23 DIAGNOSIS — M6281 Muscle weakness (generalized): Secondary | ICD-10-CM | POA: Diagnosis not present

## 2023-03-23 DIAGNOSIS — R2689 Other abnormalities of gait and mobility: Secondary | ICD-10-CM

## 2023-03-23 NOTE — Therapy (Signed)
Daily Note   Patient Name: John [name] MRN: [id number] DOB:1935-08-04, 87 y.o., male Today's Date: 03/23/2023  END OF SESSION:  PT End of Session - 03/23/23 0914     Visit Number 6    Number of Visits 9    Date for PT Re-Evaluation 04/13/23    Authorization Type MCR A & B    PT Start Time 0915    PT Stop Time 0955    PT Time Calculation (min) 40 min    Activity Tolerance Patient tolerated treatment well    Behavior During Therapy WFL for tasks assessed/performed                Past Medical History:  Diagnosis Date   Anxiety    Atrial fibrillation (HCC)    CAD (coronary artery disease)    s/p CABG   Carotid stenosis    mild by doppler   Complication of anesthesia    zenkers  diverticulum   Deep vein thrombophlebitis of leg (HCC)    DJD (degenerative joint disease)    Elevated TSH    secondary to Amio-normal free T4.    Essential hypertension, benign    GERD (gastroesophageal reflux disease)    Hemorrhoids    Hiatal hernia    Hyperlipidemia    PAF (paroxysmal atrial fibrillation) (HCC)    Amiodarone   PE (pulmonary embolism)    pre 2007. Chronic anticoagulation   Prostate cancer (HCC)    Sebaceous cyst    neck   Seborrheic dermatitis    Tubular adenoma of colon 05/2011   Past Surgical History:  Procedure Laterality Date   bilateral shoulder surgery     CORONARY ARTERY BYPASS GRAFT     x2    2009   ESOPHAGOGASTRODUODENOSCOPY (EGD) WITH PROPOFOL N/A 01/24/2023   Procedure: ESOPHAGOGASTRODUODENOSCOPY (EGD) WITH PROPOFOL;  Surgeon: Napoleon Form, MD;  Location: [facility] ENDOSCOPY;  Service: Gastroenterology;  Laterality: N/A;   ESOPHAGOGASTRODUODENOSCOPY (EGD) WITH PROPOFOL N/A 01/26/2023   Procedure: ESOPHAGOGASTRODUODENOSCOPY (EGD) WITH PROPOFOL;  Surgeon: Jenel Lucks, MD;  Location: [facility] ENDOSCOPY;  Service: Gastroenterology;  Laterality: N/A;   I & D EXTREMITY  07/09/2011   Procedure: IRRIGATION AND DEBRIDEMENT EXTREMITY;  Surgeon: Tami Ribas;   Location:  SURGERY CENTER;  Service: Orthopedics;  Laterality: Left;   left knee arthroscopy     MITRAL VALVE REPAIR     POLYPECTOMY  01/26/2023   Procedure: POLYPECTOMY;  Surgeon: Jenel Lucks, MD;  Location: [facility] ENDOSCOPY;  Service: Gastroenterology;;   prostate cancer surgery  11/1996   radial retropubic prostatectomy   SCLEROTHERAPY  01/26/2023   Procedure: SCLEROTHERAPY;  Surgeon: Jenel Lucks, MD;  Location: [facility] ENDOSCOPY;  Service: Gastroenterology;;  Epi prior to polyp removal   TANDEM AND OVOID INSERTION     VASECTOMY     ZENKER'S DIVERTICULECTOMY N/A 11/07/2012   Procedure: ZENKER'S DIVERTICULECTOMY ENDOSCOPIC;  Surgeon: Christia Reading, MD;  Location: [facility] OR;  Service: ENT;  Laterality: N/A;   Patient Active Problem List   Diagnosis Date Noted   Bleeding on Coumadin 01/25/2023   Gastric polyp 01/24/2023   GI bleed 01/22/2023   Epiretinal membrane (ERM) of right eye 04/16/2021   Cystoid macular edema of right eye 10/16/2019   Diplopia 10/16/2019   Retinal detachment of right eye with single break 10/16/2019   Pre-operative clearance 02/08/2018   Claudication in peripheral vascular disease (HCC) 01/14/2018   Femoral bruit 01/14/2018   Fatigue 01/14/2018   Elevated TSH  PAF (paroxysmal atrial fibrillation) (HCC)    H/O mitral valve repair 09/09/2017   Vitamin D deficiency 09/09/2017   Prostate cancer (HCC)    Hyperlipidemia    Hiatal hernia    GERD (gastroesophageal reflux disease)    DJD (degenerative joint disease)    Deep vein thrombophlebitis of leg (HCC)    Complication of anesthesia    Carotid stenosis    Anxiety    Shingles 09/09/2015   History of DVT (deep vein thrombosis) 09/21/2014   History of excision of Zenker's diverticulum 10/04/2013   Zenker's diverticulum 09/12/2012   Dysphagia 03/14/2012   Bronchitis, acute 10/30/2011   Colon polyps 09/07/2011   Tubular adenoma of colon 05/07/2011   Hx of CABG 03/17/2011   Panic anxiety  syndrome 03/10/2011   Long term (current) use of anticoagulants 08/31/2010   Mitral valve disorder 12/10/2009   History of pulmonary embolism 04/25/2009   DEEP VENOUS THROMBOPHLEBITIS 04/25/2009   HEMORRHOIDS 04/25/2009   GASTROESOPHAGEAL REFLUX DISEASE 04/25/2009   SEBORRHEIC DERMATITIS 04/25/2009   SEBACEOUS CYST, NECK 04/25/2009   Osteoarthritis 04/25/2009   Orthostatic hypotension 04/24/2009   NECK PAIN 04/24/2009   DIZZINESS 04/24/2009   Essential hypertension 01/10/2009   MITRAL VALVE REPLACEMENT, HX OF 01/10/2009   ATRIAL FIBRILLATION 10/25/2008    PCP: Merri Brunette, MD  REFERRING PROVIDER: Susa Griffins, MD  REFERRING DIAG: Muscle weakness (generalized) (M62.81); Unsteadiness on feet (R26.81)   THERAPY DIAG:  Other abnormalities of gait and mobility  Muscle weakness (generalized)  Rationale for Evaluation and Treatment: Rehabilitation  ONSET DATE: 01/22/23  SUBJECTIVE:   SUBJECTIVE STATEMENT: Patient reporting that he has recovered from the LE fatigue he was experiencing last week after standing at the [facility].    PERTINENT HISTORY: PMHx includes anxiety, atrial fibrillation, CAD with CABG, carotid stenosis, DVT, DJD, essential HTN, GERD, PAF, PE, prostate cancer, GI bleed, Diplopia, mitral valve repair, OA, orthostatic hypotension  PAIN:  Are you having pain? No  PRECAUTIONS: None  RED FLAGS: None   WEIGHT BEARING RESTRICTIONS: No  FALLS:  Has patient fallen in last 6 months? Yes. Number of falls 2 years, d/t syncope  LIVING ENVIRONMENT: Lives with: lives alone Lives in: House/apartment Stairs: Yes: External: 3 steps; none "I'm not using the stairs inside really, all the bedrooms are downstairs"  Has following equipment at home: None  OCCUPATION: retired  PLOF: Independent, Independent with basic ADLs, Independent with household mobility without device, Independent with homemaking with ambulation, and uses assistance for   PATIENT GOALS: "I  would like to be a walking a duration with heel navigation"   NEXT MD VISIT: 02/18/23  OBJECTIVE:   DIAGNOSTIC FINDINGS: n/a  PATIENT SURVEYS:  FOTO: 59 current, 59 predicted   COGNITION: Overall cognitive status: Within functional limits for tasks assessed     SENSATION: WFL  POSTURE: rounded shoulders, forward head, and swayback while standing   LOWER EXTREMITY MMT:  MMT Right eval Left eval  Hip flexion 5 5  Hip extension    Hip abduction 4+ 4+  Hip adduction 4+ 4+  Hip internal rotation    Hip external rotation    Knee flexion    Knee extension 5 5  Ankle dorsiflexion 5 5  Ankle plantarflexion    Ankle inversion    Ankle eversion     (Blank rows = not tested)  FUNCTIONAL TESTS:  5 times sit to stand: 17 sec   Norm for 80+ y.o. is <14 sec  Timed up and go (TUG): not  performed at eval 6 minute walk test: not performed at eval   GAIT: Distance walked: 50 Assistive device utilized: None Level of assistance: Complete Independence Comments: ambulates w/o AD, normal to wide BOS, slightly diminished gait speed.   BALANCE:   Balance    Romberg EO on floor  30s  Romberg EC on floor 30s  Romberg EO on airex 30s  Romberg EC on airex 30s with mod sway    Balance Right Left   Tandem on floor 22s 20s  SLS on floor  4s 5s      TODAY'S TREATMENT:  [facility] Adult PT Treatment:                                                DATE: 03/23/2023  Therapeutic Exercise:  Nustep level 5 x 5 mins  Step Downs fwd, x10 each LE Step Downs (heel taps) to airex x 10 each LE Standing hip abduction 2x10 BIL STS onto airex, 2 x 10  Seated Hip adduction squeeze - x15, 3 sec hold  Seated march with RTB - x15 each LE  Seated hip abduction, RTB x 20     [facility] Adult PT Treatment:                                                DATE: 03/16/2023  Therapeutic Exercise: Treadmill x 3 minutes, incline 2-4%, speed .8-1.2 Standing hip abduction 2x10 BIL Seated Hip adduction  squeeze - 2x10, 3 sec hold  Alternating march with YTB - 2x10 Knee ext machine - 15# - 2x15 Knee flexion machine - 25# - 2x15 STS x5 see STG for outcome & retest at next visit  Neuromuscular re-ed: Semi tandem on foam Tandem on floor   Consider working on step downs (forward/lat) at next visit   [facility] Adult PT Treatment:                                                DATE: 03/09/23 Work on step downs fwd/lateral at next visit  Therapeutic Exercise: Nustep level 5 x 5 mins  Standing hip abduction 2x10 BIL Hip adduction squeeze - 2x10 Glute bridge - 2x10 Alternating march with YTB - 2x10  Knee ext machine - 15# - 2x10 Knee flexion machine - 25# - 2x10 STS 2x10, second set on airex   Neuromuscular re-ed: Semi tandem - 100% Semi tandem 50% on foam Lateral walking on airex beam   [facility] Adult PT Treatment:                                                DATE: 03/02/23 Therapeutic Exercise: Nustep level 5 x 5 mins LTR x10 BIL Standing hip abduction 2x10 BIL Hip adduction squeeze - 2x10 Glute bridge - 2x10 Alternating march with YTB - 2x10 Knee ext machine - 10# - 2x10 Knee flexion machine - 20# - 2x10 STS 2x10 Neuromuscular re-ed: Semi tandem - 100% Semi tandem 50% on foam  Lateral walking on airex beam                                                                                                                                  PATIENT EDUCATION:  Education details: reviewed initial home exercise program; discussion of POC, prognosis and goals for skilled PT   Person educated: Patient Education method: Explanation, Demonstration, and Handouts Education comprehension: verbalized understanding, returned demonstration, and needs further education  HOME EXERCISE PROGRAM: Access Code: [email or web address] URL: [email or web address] Date: 03/09/2023 Prepared by: Mauri Reading  Exercises - Hooklying Clamshell with Resistance  - 1 x daily - 7 x weekly - 3 sets - 10  reps - Supine Hip Adduction Isometric with Ball  - 1 x daily - 7 x weekly - 2 sets - 10 reps - 10'' hold - Supine March with Resistance Band  - 1 x daily - 7 x weekly - 2 sets - 10 reps - Supine Bridge  - 1 x daily - 7 x weekly - 2 sets - 10 reps - 2-3 seconds hold - Standing Hip Extension with Counter Support  - 1 x daily - 7 x weekly - 2 sets - 10 reps - Standing Hip Abduction with Counter Support  - 1 x daily - 7 x weekly - 2 sets - 10 reps - Standing March with Counter Support  - 1 x daily - 7 x weekly - 2 sets - 10 reps  ASSESSMENT:  CLINICAL IMPRESSION: Today's session focused on LE strengthening and stair navigation activities. He did reports mild Lt knee pain with step up activities, and felt fatigue and dizziness within first 15 minutes of standing activities. During seated rest break, vitals collected were 77 bpm, 98% O2 sat. He reported some BIL hip fatigue and pain following today's exercises.     OBJECTIVE IMPAIRMENTS: Abnormal gait, decreased activity tolerance, decreased balance, decreased endurance, decreased mobility, decreased strength, decreased safety awareness, postural dysfunction, and pain.   ACTIVITY LIMITATIONS: carrying, standing, stairs, transfers, dressing, and locomotion level  PARTICIPATION LIMITATIONS: meal prep, cleaning, shopping, and community activity  PERSONAL FACTORS: Age, Past/current experiences, Time since onset of injury/illness/exacerbation, and 3+ comorbidities: PMHx includes anxiety, atriall fibrillation, CAD with CABG, carotid stenosis, DVT, DJD, essential HTN, GERD, PAF, PE, prostate cancer, GI bleed, Diplopia, mitral valve repair, OA, orthostatic hypotension  are also affecting patient's functional outcome.   REHAB POTENTIAL: Fair    CLINICAL DECISION MAKING: Evolving/moderate complexity  EVALUATION COMPLEXITY: Moderate   GOALS: Goals reviewed with patient? Yes  SHORT TERM GOALS: Target date: 03/16/2023   Patient will be independent  with initial home program for SLS balance.  Baseline: provided at eval  Goal status: MET Pt reports adherence    2.  Patient will demonstrate improved tandem stance to at least 25-30 sec in order to decrease risk of falls.  Baseline:  Balance Right Left   Tandem on floor 30s 26s   Goal  status: MET  3.  Patient will be able to complete 5x sit to stand in <14 seconds or less.  Baseline: 17 sec 03/16/23: 21 sec d/t muscle fatigue and unsteadiness as noted in subjective statement  Goal status: ONGOING    LONG TERM GOALS: Target date: 04/13/2023   Patient will demonstrate improved SLS to at least 15-20 sec each LE in order to decrease risk of falls.   Baseline:  Balance Right Left   SLS on floor  4s 5s   Goal status: INITIAL  2.  Patient will demonstrate improved postural awareness for at least 15 minutes while standing without need for cueing from PT.   Baseline: see objective measures  Goal status: INITIAL   3.  Patient will demonstrate low fall risk as noted by TUG score.  Baseline: to be assessed at initial evaluation Goal status: INITIAL  4.  Patient will report ability to return to community ambulation including safe navigation of hills in order to return to normal exercise routine.  Baseline: unable to tolerate.  Goal status: INITIAL  5.  Patient will demonstrate ability to ascend and descend 6" step at least 10x without LOB and use of UE support as appropriate.  Baseline: severe difficulty related to visual deficits and LE weakness.  Goal status: INITIAL    PLAN:  PT FREQUENCY: 1-2x/week  PT DURATION: 8 weeks  PLANNED INTERVENTIONS: Therapeutic exercises, Therapeutic activity, Neuromuscular re-education, Balance training, Gait training, Patient/Family education, Self Care, Aquatic Therapy, Manual therapy, and Re-evaluation  PLAN FOR NEXT SESSION: Static & dynamic balance with focus on navigating narrow BOS, SLS, navigating stairs and inclined surfaces,  weight bearing LE strengthening, navigating uneven surfaces   Mauri Reading, PT, DPT 03/23/2023, 9:51 AM

## 2023-03-29 ENCOUNTER — Other Ambulatory Visit: Payer: Self-pay | Admitting: Cardiology

## 2023-03-29 DIAGNOSIS — I4891 Unspecified atrial fibrillation: Secondary | ICD-10-CM

## 2023-03-30 ENCOUNTER — Ambulatory Visit: Payer: Medicare Other

## 2023-03-30 DIAGNOSIS — R2689 Other abnormalities of gait and mobility: Secondary | ICD-10-CM | POA: Diagnosis not present

## 2023-03-30 DIAGNOSIS — M6281 Muscle weakness (generalized): Secondary | ICD-10-CM

## 2023-03-30 NOTE — Therapy (Signed)
Daily Note   Patient Name: John [name] MRN: [id number] DOB:1935-09-26, 87 y.o., male Today's Date: 03/30/2023  END OF SESSION:  PT End of Session - 03/30/23 0916     Visit Number 7    Number of Visits 9    Date for PT Re-Evaluation 04/13/23    Authorization Type MCR A & B    PT Start Time 0916    PT Stop Time 0952    PT Time Calculation (min) 36 min    Activity Tolerance Patient tolerated treatment well    Behavior During Therapy WFL for tasks assessed/performed                 Past Medical History:  Diagnosis Date   Anxiety    Atrial fibrillation (HCC)    CAD (coronary artery disease)    s/p CABG   Carotid stenosis    mild by doppler   Complication of anesthesia    zenkers  diverticulum   Deep vein thrombophlebitis of leg (HCC)    DJD (degenerative joint disease)    Elevated TSH    secondary to Amio-normal free T4.    Essential hypertension, benign    GERD (gastroesophageal reflux disease)    Hemorrhoids    Hiatal hernia    Hyperlipidemia    PAF (paroxysmal atrial fibrillation) (HCC)    Amiodarone   PE (pulmonary embolism)    pre 2007. Chronic anticoagulation   Prostate cancer (HCC)    Sebaceous cyst    neck   Seborrheic dermatitis    Tubular adenoma of colon 05/2011   Past Surgical History:  Procedure Laterality Date   bilateral shoulder surgery     CORONARY ARTERY BYPASS GRAFT     x2    2009   ESOPHAGOGASTRODUODENOSCOPY (EGD) WITH PROPOFOL N/A 01/24/2023   Procedure: ESOPHAGOGASTRODUODENOSCOPY (EGD) WITH PROPOFOL;  Surgeon: Napoleon Form, MD;  Location: [facility];  Service: Gastroenterology;  Laterality: N/A;   ESOPHAGOGASTRODUODENOSCOPY (EGD) WITH PROPOFOL N/A 01/26/2023   Procedure: ESOPHAGOGASTRODUODENOSCOPY (EGD) WITH PROPOFOL;  Surgeon: Jenel Lucks, MD;  Location: [facility] ENDOSCOPY;  Service: Gastroenterology;  Laterality: N/A;   I & D EXTREMITY  07/09/2011   Procedure: IRRIGATION AND DEBRIDEMENT EXTREMITY;  Surgeon: Tami Ribas;  Location: [facility];  Service: Orthopedics;  Laterality: Left;   left knee arthroscopy     MITRAL VALVE REPAIR     POLYPECTOMY  01/26/2023   Procedure: POLYPECTOMY;  Surgeon: Jenel Lucks, MD;  Location: [facility] ENDOSCOPY;  Service: Gastroenterology;;   prostate cancer surgery  11/1996   radial retropubic prostatectomy   SCLEROTHERAPY  01/26/2023   Procedure: SCLEROTHERAPY;  Surgeon: Jenel Lucks, MD;  Location: [facility] ENDOSCOPY;  Service: Gastroenterology;;  Epi prior to polyp removal   TANDEM AND OVOID INSERTION     VASECTOMY     ZENKER'S DIVERTICULECTOMY N/A 11/07/2012   Procedure: ZENKER'S DIVERTICULECTOMY ENDOSCOPIC;  Surgeon: Christia Reading, MD;  Location: [facility] OR;  Service: ENT;  Laterality: N/A;   Patient Active Problem List   Diagnosis Date Noted   Bleeding on Coumadin 01/25/2023   Gastric polyp 01/24/2023   GI bleed 01/22/2023   Epiretinal membrane (ERM) of right eye 04/16/2021   Cystoid macular edema of right eye 10/16/2019   Diplopia 10/16/2019   Retinal detachment of right eye with single break 10/16/2019   Pre-operative clearance 02/08/2018   Claudication in peripheral vascular disease (HCC) 01/14/2018   Femoral bruit 01/14/2018   Fatigue 01/14/2018   Elevated TSH  PAF (paroxysmal atrial fibrillation) (HCC)    H/O mitral valve repair 09/09/2017   Vitamin D deficiency 09/09/2017   Prostate cancer (HCC)    Hyperlipidemia    Hiatal hernia    GERD (gastroesophageal reflux disease)    DJD (degenerative joint disease)    Deep vein thrombophlebitis of leg (HCC)    Complication of anesthesia    Carotid stenosis    Anxiety    Shingles 09/09/2015   History of DVT (deep vein thrombosis) 09/21/2014   History of excision of Zenker's diverticulum 10/04/2013   Zenker's diverticulum 09/12/2012   Dysphagia 03/14/2012   Bronchitis, acute 10/30/2011   Colon polyps 09/07/2011   Tubular adenoma of colon 05/07/2011   Hx of CABG 03/17/2011   Panic anxiety  syndrome 03/10/2011   Long term (current) use of anticoagulants 08/31/2010   Mitral valve disorder 12/10/2009   History of pulmonary embolism 04/25/2009   DEEP VENOUS THROMBOPHLEBITIS 04/25/2009   HEMORRHOIDS 04/25/2009   GASTROESOPHAGEAL REFLUX DISEASE 04/25/2009   SEBORRHEIC DERMATITIS 04/25/2009   SEBACEOUS CYST, NECK 04/25/2009   Osteoarthritis 04/25/2009   Orthostatic hypotension 04/24/2009   NECK PAIN 04/24/2009   DIZZINESS 04/24/2009   Essential hypertension 01/10/2009   MITRAL VALVE REPLACEMENT, HX OF 01/10/2009   ATRIAL FIBRILLATION 10/25/2008    PCP: Merri Brunette, MD  REFERRING PROVIDER: Susa Griffins, MD  REFERRING DIAG: Muscle weakness (generalized) (M62.81); Unsteadiness on feet (R26.81)   THERAPY DIAG:  Other abnormalities of gait and mobility  Muscle weakness (generalized)  Rationale for Evaluation and Treatment: Rehabilitation  ONSET DATE: 01/22/23  SUBJECTIVE:   SUBJECTIVE STATEMENT: Patient reporting to PT with some improved pain since last session. However, he states that both of his hips were bothering him a lot after working on stairs and he was considering talking to an orthopedic. "I've had the pain before, but it was really just due to old age and playing tennis"   PERTINENT HISTORY: PMHx includes anxiety, atrial fibrillation, CAD with CABG, carotid stenosis, DVT, DJD, essential HTN, GERD, PAF, PE, prostate cancer, GI bleed, Diplopia, mitral valve repair, OA, orthostatic hypotension  PAIN:  Are you having pain? No  PRECAUTIONS: None  RED FLAGS: None   WEIGHT BEARING RESTRICTIONS: No  FALLS:  Has patient fallen in last 6 months? Yes. Number of falls 2 years, d/t syncope  LIVING ENVIRONMENT: Lives with: lives alone Lives in: House/apartment Stairs: Yes: External: 3 steps; none "I'm not using the stairs inside really, all the bedrooms are downstairs"  Has following equipment at home: None  OCCUPATION: retired  PLOF: Independent,  Independent with basic ADLs, Independent with household mobility without device, Independent with homemaking with ambulation, and uses assistance for   PATIENT GOALS: "I would like to be a walking a duration with heel navigation"   NEXT MD VISIT: 02/18/23  OBJECTIVE:   DIAGNOSTIC FINDINGS: n/a  PATIENT SURVEYS:  FOTO: 59 current, 35 predicted   COGNITION: Overall cognitive status: Within functional limits for tasks assessed     SENSATION: WFL  POSTURE: rounded shoulders, forward head, and swayback while standing   LOWER EXTREMITY MMT:  MMT Right eval Left eval  Hip flexion 5 5  Hip extension    Hip abduction 4+ 4+  Hip adduction 4+ 4+  Hip internal rotation    Hip external rotation    Knee flexion    Knee extension 5 5  Ankle dorsiflexion 5 5  Ankle plantarflexion    Ankle inversion    Ankle eversion     (  Blank rows = not tested)  FUNCTIONAL TESTS:  5 times sit to stand: 17 sec   Norm for 80+ y.o. is <14 sec  Timed up and go (TUG): not performed at eval 6 minute walk test: not performed at eval   GAIT: Distance walked: 50 Assistive device utilized: None Level of assistance: Complete Independence Comments: ambulates w/o AD, normal to wide BOS, slightly diminished gait speed.   BALANCE:   Balance    Romberg EO on floor  30s  Romberg EC on floor 30s  Romberg EO on airex 30s  Romberg EC on airex 30s with mod sway    Balance Right Left   Tandem on floor 22s 20s  SLS on floor  4s 5s      TODAY'S TREATMENT:   [facility] Adult PT Treatment:                                                DATE: 03/30/2023  Therapeutic Exercise:  Nustep level 5 x 5 mins  Lateral Step ups to airex, x 10 each Fwd Step ups to airex x 10 each  Standing hip abduction 2x10 BIL STS on ground, 2 x 10  STS onto airex, 2 x 10  Seated Hip adduction squeeze - p ring x 10, some p!   Seated march with GTB - x 1 min  Seated hip abduction, GTB x 20  Semi tandem on foam, 2 x 30 sec  each  [facility] Adult PT Treatment:                                                DATE: 03/23/2023  Therapeutic Exercise:  Nustep level 5 x 5 mins  Step Downs fwd, x10 each LE Step Downs (heel taps) to airex x 10 each LE Standing hip abduction 2x10 BIL STS onto airex, 2 x 10  Seated Hip adduction squeeze - x15, 3 sec hold  Seated march with RTB - x15 each LE  Seated hip abduction, RTB x 20     [facility] Adult PT Treatment:                                                DATE: 03/16/2023  Therapeutic Exercise: Treadmill x 3 minutes, incline 2-4%, speed .8-1.2 Standing hip abduction 2x10 BIL Seated Hip adduction squeeze - 2x10, 3 sec hold  Alternating march with YTB - 2x10 Knee ext machine - 15# - 2x15 Knee flexion machine - 25# - 2x15 STS x5 see STG for outcome & retest at next visit  Neuromuscular re-ed: Semi tandem on foam Tandem on floor   Consider working on step downs (forward/lat) at next visit  PATIENT EDUCATION:  Education details: reviewed initial home exercise program; discussion of POC, prognosis and goals for skilled PT   Person educated: Patient Education method: Explanation, Demonstration, and Handouts Education comprehension: verbalized understanding, returned demonstration, and needs further education  HOME EXERCISE PROGRAM: Access Code: [email or web address] URL: [email or web address] Date: 03/09/2023 Prepared by: Mauri Reading  Exercises - Hooklying Clamshell with Resistance  - 1 x daily - 7 x weekly - 3 sets - 10 reps - Supine Hip Adduction Isometric with Ball  - 1 x daily - 7 x weekly - 2 sets - 10 reps - 10'' hold - Supine March with Resistance Band  - 1 x daily - 7 x weekly - 2 sets - 10 reps - Supine Bridge  - 1 x daily - 7 x weekly - 2 sets - 10 reps - 2-3 seconds hold - Standing Hip Extension with Counter Support  - 1 x  daily - 7 x weekly - 2 sets - 10 reps - Standing Hip Abduction with Counter Support  - 1 x daily - 7 x weekly - 2 sets - 10 reps - Standing March with Counter Support  - 1 x daily - 7 x weekly - 2 sets - 10 reps  ASSESSMENT:  CLINICAL IMPRESSION: John [name] continues to be limited by diplopia and hip pain. Discussed depth perception strategies with stair navigation at home. We decreased height and repetitions for step up exercises today, to decrease post-exercise symptom exacerbation. He wore his eye patch throughout today's session and did not report any dizziness episodes today. He was able to perform seated hip strengthening activities with more resistance today. We will continue with gradual progression of exercises as appropriate in order to reach established rehab goals.      OBJECTIVE IMPAIRMENTS: Abnormal gait, decreased activity tolerance, decreased balance, decreased endurance, decreased mobility, decreased strength, decreased safety awareness, postural dysfunction, and pain.   ACTIVITY LIMITATIONS: carrying, standing, stairs, transfers, dressing, and locomotion level  PARTICIPATION LIMITATIONS: meal prep, cleaning, shopping, and community activity  PERSONAL FACTORS: Age, Past/current experiences, Time since onset of injury/illness/exacerbation, and 3+ comorbidities: PMHx includes anxiety, atriall fibrillation, CAD with CABG, carotid stenosis, DVT, DJD, essential HTN, GERD, PAF, PE, prostate cancer, GI bleed, Diplopia, mitral valve repair, OA, orthostatic hypotension  are also affecting patient's functional outcome.   REHAB POTENTIAL: Fair    CLINICAL DECISION MAKING: Evolving/moderate complexity  EVALUATION COMPLEXITY: Moderate   GOALS: Goals reviewed with patient? Yes  SHORT TERM GOALS: Target date: 03/16/2023   Patient will be independent with initial home program for SLS balance.  Baseline: provided at eval  Goal status: MET Pt reports adherence    2.  Patient will  demonstrate improved tandem stance to at least 25-30 sec in order to decrease risk of falls.  Baseline:  Balance Right Left   Tandem on floor 30s 26s   Goal status: MET  3.  Patient will be able to complete 5x sit to stand in <14 seconds or less.  Baseline: 17 sec 03/16/23: 21 sec d/t muscle fatigue and unsteadiness as noted in subjective statement  Goal status: ONGOING    LONG TERM GOALS: Target date: 04/13/2023   Patient will demonstrate improved SLS to at least 15-20 sec each LE in order to decrease risk of falls.   Baseline:  Balance Right Left   SLS on floor  4s 5s   Goal status: INITIAL  2.  Patient will demonstrate improved postural awareness for at least 15 minutes while standing without need  for cueing from PT.   Baseline: see objective measures  Goal status: INITIAL   3.  Patient will demonstrate low fall risk as noted by TUG score.  Baseline: to be assessed at initial evaluation Goal status: INITIAL  4.  Patient will report ability to return to community ambulation including safe navigation of hills in order to return to normal exercise routine.  Baseline: unable to tolerate.  Goal status: INITIAL  5.  Patient will demonstrate ability to ascend and descend 6" step at least 10x without LOB and use of UE support as appropriate.  Baseline: severe difficulty related to visual deficits and LE weakness.  Goal status: INITIAL    PLAN:  PT FREQUENCY: 1-2x/week  PT DURATION: 8 weeks  PLANNED INTERVENTIONS: Therapeutic exercises, Therapeutic activity, Neuromuscular re-education, Balance training, Gait training, Patient/Family education, Self Care, Aquatic Therapy, Manual therapy, and Re-evaluation  PLAN FOR NEXT SESSION: Static & dynamic balance with focus on navigating narrow BOS, SLS, navigating stairs and inclined surfaces, weight bearing LE strengthening, navigating uneven surfaces   Mauri Reading, PT, DPT 03/30/2023, 9:56 AM

## 2023-04-06 ENCOUNTER — Ambulatory Visit: Payer: Medicare Other | Attending: Internal Medicine

## 2023-04-06 DIAGNOSIS — M6281 Muscle weakness (generalized): Secondary | ICD-10-CM | POA: Diagnosis not present

## 2023-04-06 DIAGNOSIS — R2689 Other abnormalities of gait and mobility: Secondary | ICD-10-CM | POA: Insufficient documentation

## 2023-04-06 NOTE — Therapy (Signed)
Daily Note   Patient Name: John [name] MRN: [id number] DOB:04/14/1936, 87 y.o., male Today's Date: 04/06/2023  END OF SESSION:  PT End of Session - 04/06/23 0912     Visit Number 8    Number of Visits 9    Date for PT Re-Evaluation 04/13/23    Authorization Type MCR A & B    PT Start Time 0915    PT Stop Time 0953    PT Time Calculation (min) 38 min    Activity Tolerance Patient tolerated treatment well    Behavior During Therapy WFL for tasks assessed/performed             Past Medical History:  Diagnosis Date   Anxiety    Atrial fibrillation (HCC)    CAD (coronary artery disease)    s/p CABG   Carotid stenosis    mild by doppler   Complication of anesthesia    zenkers  diverticulum   Deep vein thrombophlebitis of leg (HCC)    DJD (degenerative joint disease)    Elevated TSH    secondary to Amio-normal free T4.    Essential hypertension, benign    GERD (gastroesophageal reflux disease)    Hemorrhoids    Hiatal hernia    Hyperlipidemia    PAF (paroxysmal atrial fibrillation) (HCC)    Amiodarone   PE (pulmonary embolism)    pre 2007. Chronic anticoagulation   Prostate cancer (HCC)    Sebaceous cyst    neck   Seborrheic dermatitis    Tubular adenoma of colon 05/2011   Past Surgical History:  Procedure Laterality Date   bilateral shoulder surgery     CORONARY ARTERY BYPASS GRAFT     x2    2009   ESOPHAGOGASTRODUODENOSCOPY (EGD) WITH PROPOFOL N/A 01/24/2023   Procedure: ESOPHAGOGASTRODUODENOSCOPY (EGD) WITH PROPOFOL;  Surgeon: Napoleon Form, MD;  Location: [facility] ENDOSCOPY;  Service: Gastroenterology;  Laterality: N/A;   ESOPHAGOGASTRODUODENOSCOPY (EGD) WITH PROPOFOL N/A 01/26/2023   Procedure: ESOPHAGOGASTRODUODENOSCOPY (EGD) WITH PROPOFOL;  Surgeon: Jenel Lucks, MD;  Location: [facility] ENDOSCOPY;  Service: Gastroenterology;  Laterality: N/A;   I & D EXTREMITY  07/09/2011   Procedure: IRRIGATION AND DEBRIDEMENT EXTREMITY;  Surgeon: Tami Ribas;   Location: [facility];  Service: Orthopedics;  Laterality: Left;   left knee arthroscopy     MITRAL VALVE REPAIR     POLYPECTOMY  01/26/2023   Procedure: POLYPECTOMY;  Surgeon: Jenel Lucks, MD;  Location: [facility] ENDOSCOPY;  Service: Gastroenterology;;   prostate cancer surgery  11/1996   radial retropubic prostatectomy   SCLEROTHERAPY  01/26/2023   Procedure: SCLEROTHERAPY;  Surgeon: Jenel Lucks, MD;  Location: [facility] ENDOSCOPY;  Service: Gastroenterology;;  Epi prior to polyp removal   TANDEM AND OVOID INSERTION     VASECTOMY     ZENKER'S DIVERTICULECTOMY N/A 11/07/2012   Procedure: ZENKER'S DIVERTICULECTOMY ENDOSCOPIC;  Surgeon: Christia Reading, MD;  Location: [facility] OR;  Service: ENT;  Laterality: N/A;   Patient Active Problem List   Diagnosis Date Noted   Bleeding on Coumadin 01/25/2023   Gastric polyp 01/24/2023   GI bleed 01/22/2023   Epiretinal membrane (ERM) of right eye 04/16/2021   Cystoid macular edema of right eye 10/16/2019   Diplopia 10/16/2019   Retinal detachment of right eye with single break 10/16/2019   Pre-operative clearance 02/08/2018   Claudication in peripheral vascular disease (HCC) 01/14/2018   Femoral bruit 01/14/2018   Fatigue 01/14/2018   Elevated TSH    PAF (  paroxysmal atrial fibrillation) (HCC)    H/O mitral valve repair 09/09/2017   Vitamin D deficiency 09/09/2017   Prostate cancer (HCC)    Hyperlipidemia    Hiatal hernia    GERD (gastroesophageal reflux disease)    DJD (degenerative joint disease)    Deep vein thrombophlebitis of leg (HCC)    Complication of anesthesia    Carotid stenosis    Anxiety    Shingles 09/09/2015   History of DVT (deep vein thrombosis) 09/21/2014   History of excision of Zenker's diverticulum 10/04/2013   Zenker's diverticulum 09/12/2012   Dysphagia 03/14/2012   Bronchitis, acute 10/30/2011   Colon polyps 09/07/2011   Tubular adenoma of colon 05/07/2011   Hx of CABG 03/17/2011   Panic anxiety  syndrome 03/10/2011   Long term (current) use of anticoagulants 08/31/2010   Mitral valve disorder 12/10/2009   History of pulmonary embolism 04/25/2009   DEEP VENOUS THROMBOPHLEBITIS 04/25/2009   HEMORRHOIDS 04/25/2009   GASTROESOPHAGEAL REFLUX DISEASE 04/25/2009   SEBORRHEIC DERMATITIS 04/25/2009   SEBACEOUS CYST, NECK 04/25/2009   Osteoarthritis 04/25/2009   Orthostatic hypotension 04/24/2009   NECK PAIN 04/24/2009   DIZZINESS 04/24/2009   Essential hypertension 01/10/2009   MITRAL VALVE REPLACEMENT, HX OF 01/10/2009   ATRIAL FIBRILLATION 10/25/2008    PCP: Merri Brunette, MD  REFERRING PROVIDER: Susa Griffins, MD  REFERRING DIAG: Muscle weakness (generalized) (M62.81); Unsteadiness on feet (R26.81)   THERAPY DIAG:  Other abnormalities of gait and mobility  Muscle weakness (generalized)  Rationale for Evaluation and Treatment: Rehabilitation  ONSET DATE: 01/22/23  SUBJECTIVE:   SUBJECTIVE STATEMENT: Patient reports no soreness after last session, also states he is not having any dizziness today.   PERTINENT HISTORY: PMHx includes anxiety, atrial fibrillation, CAD with CABG, carotid stenosis, DVT, DJD, essential HTN, GERD, PAF, PE, prostate cancer, GI bleed, Diplopia, mitral valve repair, OA, orthostatic hypotension  PAIN:  Are you having pain? No  PRECAUTIONS: None  RED FLAGS: None   WEIGHT BEARING RESTRICTIONS: No  FALLS:  Has patient fallen in last 6 months? Yes. Number of falls 2 years, d/t syncope  LIVING ENVIRONMENT: Lives with: lives alone Lives in: House/apartment Stairs: Yes: External: 3 steps; none "I'm not using the stairs inside really, all the bedrooms are downstairs"  Has following equipment at home: None  OCCUPATION: retired  PLOF: Independent, Independent with basic ADLs, Independent with household mobility without device, Independent with homemaking with ambulation, and uses assistance for   PATIENT GOALS: "I would like to be a  walking a duration with heel navigation"   NEXT MD VISIT: 02/18/23  OBJECTIVE:   DIAGNOSTIC FINDINGS: n/a  PATIENT SURVEYS:  FOTO: 59 current, 67 predicted   COGNITION: Overall cognitive status: Within functional limits for tasks assessed     SENSATION: WFL  POSTURE: rounded shoulders, forward head, and swayback while standing   LOWER EXTREMITY MMT:  MMT Right eval Left eval  Hip flexion 5 5  Hip extension    Hip abduction 4+ 4+  Hip adduction 4+ 4+  Hip internal rotation    Hip external rotation    Knee flexion    Knee extension 5 5  Ankle dorsiflexion 5 5  Ankle plantarflexion    Ankle inversion    Ankle eversion     (Blank rows = not tested)  FUNCTIONAL TESTS:  5 times sit to stand: 17 sec   Norm for 80+ y.o. is <14 sec  Timed up and go (TUG): not performed at eval 6 minute walk  test: not performed at eval   GAIT: Distance walked: 50 Assistive device utilized: None Level of assistance: Complete Independence Comments: ambulates w/o AD, normal to wide BOS, slightly diminished gait speed.   BALANCE:   Balance    Romberg EO on floor  30s  Romberg EC on floor 30s  Romberg EO on airex 30s  Romberg EC on airex 30s with mod sway    Balance Right Left   Tandem on floor 22s 20s  SLS on floor  4s 5s      TODAY'S TREATMENT: [facility] Adult PT Treatment:                                                DATE: 04/06/23 Therapeutic Exercise: Nustep level 5 x 5 mins  Lateral Step ups to airex, x 10 each Fwd Step ups to airex x 10 each  Marching on Airex 2x30" single UE support Standing hip abduction/extension 2x10 BIL STS on ground, 2 x 10  STS onto airex, 2 x 10  Seated Hip adduction squeeze - p ring 2 x 10 Seated march with GTB - x 1 min  Seated hip abduction, GTB 2x15 Semi tandem on foam, 2 x 30 sec each   [facility] Adult PT Treatment:                                                DATE: 03/30/2023  Therapeutic Exercise:  Nustep level 5 x 5 mins  Lateral  Step ups to airex, x 10 each Fwd Step ups to airex x 10 each  Standing hip abduction 2x10 BIL STS on ground, 2 x 10  STS onto airex, 2 x 10  Seated Hip adduction squeeze - p ring x 10, some p!   Seated march with GTB - x 1 min  Seated hip abduction, GTB x 20  Semi tandem on foam, 2 x 30 sec each  [facility] Adult PT Treatment:                                                DATE: 03/23/2023  Therapeutic Exercise:  Nustep level 5 x 5 mins  Step Downs fwd, x10 each LE Step Downs (heel taps) to airex x 10 each LE Standing hip abduction 2x10 BIL STS onto airex, 2 x 10  Seated Hip adduction squeeze - x15, 3 sec hold  Seated march with RTB - x15 each LE  Seated hip abduction, RTB x 20  PATIENT EDUCATION:  Education details: reviewed initial home exercise program; discussion of POC, prognosis and goals for skilled PT   Person educated: Patient Education method: Explanation, Demonstration, and Handouts Education comprehension: verbalized understanding, returned demonstration, and needs further education  HOME EXERCISE PROGRAM: Access Code: [email or web address] URL: [email or web address] Date: 03/09/2023 Prepared by: Mauri Reading  Exercises - Hooklying Clamshell with Resistance  - 1 x daily - 7 x weekly - 3 sets - 10 reps - Supine Hip Adduction Isometric with Ball  - 1 x daily - 7 x weekly - 2 sets - 10 reps - 10'' hold - Supine March with Resistance Band  - 1 x daily - 7 x weekly - 2 sets - 10 reps - Supine Bridge  - 1 x daily - 7 x weekly - 2 sets - 10 reps - 2-3 seconds hold - Standing Hip Extension with Counter Support  - 1 x daily - 7 x weekly - 2 sets - 10 reps - Standing Hip Abduction with Counter Support  - 1 x daily - 7 x weekly - 2 sets - 10 reps - Standing March with Counter Support  - 1 x daily - 7 x weekly - 2 sets - 10  reps  ASSESSMENT:  CLINICAL IMPRESSION: Patient presents to PT reporting that he did not have any pain or soreness after the last session. Session today continued to focus on LE strengthening and balance tasks. Increased repetitions today to good effect. Patient was able to tolerate all prescribed exercises with no adverse effects. Patient continues to benefit from skilled PT services and should be progressed as able to improve functional independence. He is possibly interested in DC next session.     OBJECTIVE IMPAIRMENTS: Abnormal gait, decreased activity tolerance, decreased balance, decreased endurance, decreased mobility, decreased strength, decreased safety awareness, postural dysfunction, and pain.   ACTIVITY LIMITATIONS: carrying, standing, stairs, transfers, dressing, and locomotion level  PARTICIPATION LIMITATIONS: meal prep, cleaning, shopping, and community activity  PERSONAL FACTORS: Age, Past/current experiences, Time since onset of injury/illness/exacerbation, and 3+ comorbidities: PMHx includes anxiety, atriall fibrillation, CAD with CABG, carotid stenosis, DVT, DJD, essential HTN, GERD, PAF, PE, prostate cancer, GI bleed, Diplopia, mitral valve repair, OA, orthostatic hypotension  are also affecting patient's functional outcome.   REHAB POTENTIAL: Fair    CLINICAL DECISION MAKING: Evolving/moderate complexity  EVALUATION COMPLEXITY: Moderate   GOALS: Goals reviewed with patient? Yes  SHORT TERM GOALS: Target date: 03/16/2023   Patient will be independent with initial home program for SLS balance.  Baseline: provided at eval  Goal status: MET Pt reports adherence    2.  Patient will demonstrate improved tandem stance to at least 25-30 sec in order to decrease risk of falls.  Baseline:  Balance Right Left   Tandem on floor 30s 26s   Goal status: MET  3.  Patient will be able to complete 5x sit to stand in <14 seconds or less.  Baseline: 17 sec 03/16/23: 21  sec d/t muscle fatigue and unsteadiness as noted in subjective statement  Goal status: ONGOING    LONG TERM GOALS: Target date: 04/13/2023   Patient will demonstrate improved SLS to at least 15-20 sec each LE in order to decrease risk of falls.   Baseline:  Balance Right Left   SLS on floor  4s 5s   Goal status: INITIAL  2.  Patient will demonstrate improved postural awareness for at least 15 minutes while standing without need for cueing from PT.   Baseline: see  objective measures  Goal status: INITIAL   3.  Patient will demonstrate low fall risk as noted by TUG score.  Baseline: to be assessed at initial evaluation Goal status: INITIAL  4.  Patient will report ability to return to community ambulation including safe navigation of hills in order to return to normal exercise routine.  Baseline: unable to tolerate.  Goal status: INITIAL  5.  Patient will demonstrate ability to ascend and descend 6" step at least 10x without LOB and use of UE support as appropriate.  Baseline: severe difficulty related to visual deficits and LE weakness.  Goal status: INITIAL    PLAN:  PT FREQUENCY: 1-2x/week  PT DURATION: 8 weeks  PLANNED INTERVENTIONS: Therapeutic exercises, Therapeutic activity, Neuromuscular re-education, Balance training, Gait training, Patient/Family education, Self Care, Aquatic Therapy, Manual therapy, and Re-evaluation  PLAN FOR NEXT SESSION: Static & dynamic balance with focus on navigating narrow BOS, SLS, navigating stairs and inclined surfaces, weight bearing LE strengthening, navigating uneven surfaces   Berta Minor, PTA 04/06/2023, 9:55 AM

## 2023-04-13 ENCOUNTER — Ambulatory Visit: Payer: Medicare Other

## 2023-04-13 DIAGNOSIS — R2689 Other abnormalities of gait and mobility: Secondary | ICD-10-CM | POA: Diagnosis not present

## 2023-04-13 DIAGNOSIS — D692 Other nonthrombocytopenic purpura: Secondary | ICD-10-CM | POA: Diagnosis not present

## 2023-04-13 DIAGNOSIS — L821 Other seborrheic keratosis: Secondary | ICD-10-CM | POA: Diagnosis not present

## 2023-04-13 DIAGNOSIS — D1801 Hemangioma of skin and subcutaneous tissue: Secondary | ICD-10-CM | POA: Diagnosis not present

## 2023-04-13 DIAGNOSIS — M6281 Muscle weakness (generalized): Secondary | ICD-10-CM

## 2023-04-13 DIAGNOSIS — Z85828 Personal history of other malignant neoplasm of skin: Secondary | ICD-10-CM | POA: Diagnosis not present

## 2023-04-13 DIAGNOSIS — L509 Urticaria, unspecified: Secondary | ICD-10-CM | POA: Diagnosis not present

## 2023-04-13 DIAGNOSIS — L570 Actinic keratosis: Secondary | ICD-10-CM | POA: Diagnosis not present

## 2023-04-13 DIAGNOSIS — L720 Epidermal cyst: Secondary | ICD-10-CM | POA: Diagnosis not present

## 2023-04-13 DIAGNOSIS — D225 Melanocytic nevi of trunk: Secondary | ICD-10-CM | POA: Diagnosis not present

## 2023-04-13 NOTE — Therapy (Signed)
Daily Note/Discharge Summary    Patient Name: RAEL PHI MRN: [id number] DOB:[birth year], 87 y.o., male Today's Date: 04/13/2023  PHYSICAL THERAPY DISCHARGE SUMMARY  Visits from Start of Care: 9  Current functional level related to goals / functional outcomes: See objective findings/assessment    Remaining deficits: See objective findings/assessment    Education / Equipment: See today's treatment/assessment      Patient agrees to discharge. Patient goals were partially met. Patient is being discharged due to being pleased with the current functional level.   END OF SESSION:  PT End of Session - 04/13/23 0918     Visit Number 9    Number of Visits 9    Date for PT Re-Evaluation 04/13/23    Authorization Type MCR A & B    PT Start Time 0915    PT Stop Time 0935    PT Time Calculation (min) 20 min    Activity Tolerance Patient tolerated treatment well    Behavior During Therapy WFL for tasks assessed/performed                  Past Medical History:  Diagnosis Date   Anxiety    Atrial fibrillation (HCC)    CAD (coronary artery disease)    s/p CABG   Carotid stenosis    mild by doppler   Complication of anesthesia    zenkers  diverticulum   Deep vein thrombophlebitis of leg (HCC)    DJD (degenerative joint disease)    Elevated TSH    secondary to Amio-normal free T4.    Essential hypertension, benign    GERD (gastroesophageal reflux disease)    Hemorrhoids    Hiatal hernia    Hyperlipidemia    PAF (paroxysmal atrial fibrillation) (HCC)    Amiodarone   PE (pulmonary embolism)    pre 2007. Chronic anticoagulation   Prostate cancer (HCC)    Sebaceous cyst    neck   Seborrheic dermatitis    Tubular adenoma of colon 05/2011   Past Surgical History:  Procedure Laterality Date   bilateral shoulder surgery     CORONARY ARTERY BYPASS GRAFT     x2    2009   ESOPHAGOGASTRODUODENOSCOPY (EGD) WITH PROPOFOL N/A 01/24/2023   Procedure:  ESOPHAGOGASTRODUODENOSCOPY (EGD) WITH PROPOFOL;  Surgeon: Napoleon Form, MD;  Location: [facility];  Service: Gastroenterology;  Laterality: N/A;   ESOPHAGOGASTRODUODENOSCOPY (EGD) WITH PROPOFOL N/A 01/26/2023   Procedure: ESOPHAGOGASTRODUODENOSCOPY (EGD) WITH PROPOFOL;  Surgeon: Jenel Lucks, MD;  Location: [facility] ENDOSCOPY;  Service: Gastroenterology;  Laterality: N/A;   I & D EXTREMITY  07/09/2011   Procedure: IRRIGATION AND DEBRIDEMENT EXTREMITY;  Surgeon: Tami Ribas;  Location: [facility];  Service: Orthopedics;  Laterality: Left;   left knee arthroscopy     MITRAL VALVE REPAIR     POLYPECTOMY  01/26/2023   Procedure: POLYPECTOMY;  Surgeon: Jenel Lucks, MD;  Location: [facility] ENDOSCOPY;  Service: Gastroenterology;;   prostate cancer surgery  11/1996   radial retropubic prostatectomy   SCLEROTHERAPY  01/26/2023   Procedure: SCLEROTHERAPY;  Surgeon: Jenel Lucks, MD;  Location: [facility] ENDOSCOPY;  Service: Gastroenterology;;  Epi prior to polyp removal   TANDEM AND OVOID INSERTION     VASECTOMY     ZENKER'S DIVERTICULECTOMY N/A 11/07/2012   Procedure: ZENKER'S DIVERTICULECTOMY ENDOSCOPIC;  Surgeon: Christia Reading, MD;  Location: [facility] OR;  Service: ENT;  Laterality: N/A;   Patient Active Problem List   Diagnosis Date Noted   Bleeding  on Coumadin 01/25/2023   Gastric polyp 01/24/2023   GI bleed 01/22/2023   Epiretinal membrane (ERM) of right eye 04/16/2021   Cystoid macular edema of right eye 10/16/2019   Diplopia 10/16/2019   Retinal detachment of right eye with single break 10/16/2019   Pre-operative clearance 02/08/2018   Claudication in peripheral vascular disease (HCC) 01/14/2018   Femoral bruit 01/14/2018   Fatigue 01/14/2018   Elevated TSH    PAF (paroxysmal atrial fibrillation) (HCC)    H/O mitral valve repair 09/09/2017   Vitamin D deficiency 09/09/2017   Prostate cancer (HCC)    Hyperlipidemia    Hiatal hernia    GERD (gastroesophageal reflux  disease)    DJD (degenerative joint disease)    Deep vein thrombophlebitis of leg (HCC)    Complication of anesthesia    Carotid stenosis    Anxiety    Shingles 09/09/2015   History of DVT (deep vein thrombosis) 09/21/2014   History of excision of Zenker's diverticulum 10/04/2013   Zenker's diverticulum 09/12/2012   Dysphagia 03/14/2012   Bronchitis, acute 10/30/2011   Colon polyps 09/07/2011   Tubular adenoma of colon 05/07/2011   Hx of CABG 03/17/2011   Panic anxiety syndrome 03/10/2011   Long term (current) use of anticoagulants 08/31/2010   Mitral valve disorder 12/10/2009   History of pulmonary embolism 04/25/2009   DEEP VENOUS THROMBOPHLEBITIS 04/25/2009   HEMORRHOIDS 04/25/2009   GASTROESOPHAGEAL REFLUX DISEASE 04/25/2009   SEBORRHEIC DERMATITIS 04/25/2009   SEBACEOUS CYST, NECK 04/25/2009   Osteoarthritis 04/25/2009   Orthostatic hypotension 04/24/2009   NECK PAIN 04/24/2009   DIZZINESS 04/24/2009   Essential hypertension 01/10/2009   MITRAL VALVE REPLACEMENT, HX OF 01/10/2009   ATRIAL FIBRILLATION 10/25/2008    PCP: Merri Brunette, MD  REFERRING PROVIDER: Susa Griffins, MD  REFERRING DIAG: Muscle weakness (generalized) (M62.81); Unsteadiness on feet (R26.81)   THERAPY DIAG:  Other abnormalities of gait and mobility  Muscle weakness (generalized)  Rationale for Evaluation and Treatment: Rehabilitation  ONSET DATE: 01/22/23  SUBJECTIVE:   SUBJECTIVE STATEMENT: Patient reporting that he feels ready to be discharged from PT. He feels that he is walking better than when he started. He has been keeping up with his home program  tennis"   PERTINENT HISTORY: PMHx includes anxiety, atrial fibrillation, CAD with CABG, carotid stenosis, DVT, DJD, essential HTN, GERD, PAF, PE, prostate cancer, GI bleed, Diplopia, mitral valve repair, OA, orthostatic hypotension  PAIN:  Are you having pain? No  PRECAUTIONS: None  RED FLAGS: None   WEIGHT BEARING  RESTRICTIONS: No  FALLS:  Has patient fallen in last 6 months? Yes. Number of falls 2 years, d/t syncope  LIVING ENVIRONMENT: Lives with: lives alone Lives in: House/apartment Stairs: Yes: External: 3 steps; none "I'm not using the stairs inside really, all the bedrooms are downstairs"  Has following equipment at home: None  OCCUPATION: retired  PLOF: Independent, Independent with basic ADLs, Independent with household mobility without device, Independent with homemaking with ambulation, and uses assistance for   PATIENT GOALS: "I would like to be a walking a duration with heel navigation"   NEXT MD VISIT: 02/18/23  OBJECTIVE:   DIAGNOSTIC FINDINGS: n/a  PATIENT SURVEYS:  FOTO: 59 current, 88 predicted   COGNITION: Overall cognitive status: Within functional limits for tasks assessed     SENSATION: WFL  POSTURE: rounded shoulders, forward head, and swayback while standing   LOWER EXTREMITY MMT:  MMT Right eval Left eval  Hip flexion 5 5  Hip extension  Hip abduction 4+ 4+  Hip adduction 4+ 4+  Hip internal rotation    Hip external rotation    Knee flexion    Knee extension 5 5  Ankle dorsiflexion 5 5  Ankle plantarflexion    Ankle inversion    Ankle eversion     (Blank rows = not tested)  FUNCTIONAL TESTS:  5 times sit to stand: 17 sec   Norm for 80+ y.o. is <14 sec  Timed up and go (TUG): not performed at eval 6 minute walk test: not performed at eval   GAIT: Distance walked: 50 Assistive device utilized: None Level of assistance: Complete Independence Comments: ambulates w/o AD, normal to wide BOS, slightly diminished gait speed.   BALANCE:   Balance    Romberg EO on floor  30s  Romberg EC on floor 30s  Romberg EO on airex 30s  Romberg EC on airex 30s with mod sway    Balance Right Left   Tandem on floor 22s 20s  SLS on floor  4s 5s      TODAY'S TREATMENT:  [facility] Adult PT Treatment:                                                 DATE: 04/13/2023  Therapeutic Exercise: Nustep level 5 x 5 mins   Therapeutic Activity:  Reassessment of objective measures and subjective assessment regarding progress towards established goals and plan for independence with prescribed home program following discharged from PT    [facility] Adult PT Treatment:                                                DATE: 03/30/2023  Therapeutic Exercise:  Nustep level 5 x 5 mins  Lateral Step ups to airex, x 10 each Fwd Step ups to airex x 10 each  Standing hip abduction 2x10 BIL STS on ground, 2 x 10  STS onto airex, 2 x 10  Seated Hip adduction squeeze - p ring x 10, some p!   Seated march with GTB - x 1 min  Seated hip abduction, GTB x 20  Semi tandem on foam, 2 x 30 sec each  [facility] Adult PT Treatment:                                                DATE: 03/23/2023  Therapeutic Exercise:  Nustep level 5 x 5 mins  Step Downs fwd, x10 each LE Step Downs (heel taps) to airex x 10 each LE Standing hip abduction 2x10 BIL STS onto airex, 2 x 10  Seated Hip adduction squeeze - x15, 3 sec hold  Seated march with RTB - x15 each LE  Seated hip abduction, RTB x 20     [facility] Adult PT Treatment:                                                DATE: 03/16/2023  Therapeutic Exercise: Treadmill x  3 minutes, incline 2-4%, speed .8-1.2 Standing hip abduction 2x10 BIL Seated Hip adduction squeeze - 2x10, 3 sec hold  Alternating march with YTB - 2x10 Knee ext machine - 15# - 2x15 Knee flexion machine - 25# - 2x15 STS x5 see STG for outcome & retest at next visit  Neuromuscular re-ed: Semi tandem on foam Tandem on floor   Consider working on step downs (forward/lat) at next visit                                                                                                                                    PATIENT EDUCATION:  Education details: reviewed initial home exercise program; discussion of POC, prognosis and goals for skilled PT    Person educated: Patient Education method: Programmer, multimedia, Demonstration, and Handouts Education comprehension: verbalized understanding, returned demonstration, and needs further education  HOME EXERCISE PROGRAM: Access Code: [email or web address] URL: [email or web address] Date: 03/09/2023 Prepared by: Mauri Reading  Exercises - Hooklying Clamshell with Resistance  - 1 x daily - 7 x weekly - 3 sets - 10 reps - Supine Hip Adduction Isometric with Ball  - 1 x daily - 7 x weekly - 2 sets - 10 reps - 10'' hold - Supine March with Resistance Band  - 1 x daily - 7 x weekly - 2 sets - 10 reps - Supine Bridge  - 1 x daily - 7 x weekly - 2 sets - 10 reps - 2-3 seconds hold - Standing Hip Extension with Counter Support  - 1 x daily - 7 x weekly - 2 sets - 10 reps - Standing Hip Abduction with Counter Support  - 1 x daily - 7 x weekly - 2 sets - 10 reps - Standing March with Counter Support  - 1 x daily - 7 x weekly - 2 sets - 10 reps  ASSESSMENT:  CLINICAL IMPRESSION: Faheem has met many of his established goals, including decreased time for 5x STS test indicative of decreased fall risk. He continues to be limited with descending stairs d/t visual impairment, but reports feeling capable of navigating hills with minimal-to-no difficulty. Patient opting to not perform additional exercises today d/t another medical appt following this visit. Provided updated HEP and reviewed with patient. Patient has completed his PT POC and will be discharged from our care today, d/t patient being pleased with current functional improvements.      OBJECTIVE IMPAIRMENTS: Abnormal gait, decreased activity tolerance, decreased balance, decreased endurance, decreased mobility, decreased strength, decreased safety awareness, postural dysfunction, and pain.   ACTIVITY LIMITATIONS: carrying, standing, stairs, transfers, dressing, and locomotion level  PARTICIPATION LIMITATIONS: meal prep, cleaning, shopping, and  community activity  PERSONAL FACTORS: Age, Past/current experiences, Time since onset of injury/illness/exacerbation, and 3+ comorbidities: PMHx includes anxiety, atriall fibrillation, CAD with CABG, carotid stenosis, DVT, DJD, essential HTN, GERD, PAF, PE, prostate cancer, GI bleed, Diplopia, mitral valve repair, OA, orthostatic hypotension  are also  affecting patient's functional outcome.   REHAB POTENTIAL: Fair    CLINICAL DECISION MAKING: Evolving/moderate complexity  EVALUATION COMPLEXITY: Moderate   GOALS: Goals reviewed with patient? Yes  SHORT TERM GOALS: Target date: 03/16/2023   Patient will be independent with initial home program for SLS balance.  Baseline: provided at eval  Goal status: MET Pt reports adherence    2.  Patient will demonstrate improved tandem stance to at least 25-30 sec in order to decrease risk of falls.  Baseline:  Balance Right Left   Tandem on floor 30s 26s   Goal status: MET  3.  Patient will be able to complete 5x sit to stand in <14 seconds or less.  Baseline: 17 sec 03/16/23: 21 sec d/t muscle fatigue and unsteadiness as noted in subjective statement  04/13/23: 12 sec  Goal status: MET   LONG TERM GOALS: Target date: 04/13/2023   Patient will demonstrate improved SLS to at least 15-20 sec each LE in order to decrease risk of falls.   Baseline:  Balance Right eval Left eval  Right 04/13/23 Left 04/13/23  SLS on floor  4s 5s 8 sec 3 sec   Goal status: NOT MET  2.  Patient will demonstrate improved postural awareness for at least 15 minutes while standing without need for cueing from PT.   Baseline: see objective measures  Goal status: MET; 04/13/23  3.  Patient will demonstrate low fall risk as noted by TUG score.  Baseline: to be assessed at initial evaluation Goal status: DISCONTINUE; d/t baseline never established   4.  Patient will report ability to return to community ambulation including safe navigation of hills in order  to return to normal exercise routine.  Baseline: unable to tolerate.  Goal status: Met; per patient report 04/13/23  5.  Patient will demonstrate ability to ascend and descend 6" step at least 10x without LOB and use of UE support as appropriate.  Baseline: severe difficulty related to visual deficits and LE weakness.  Goal status: Partially met; not limited by LE and balance deficits. Patient states "its just my vision, but that's not going to change"     PLAN:  PT FREQUENCY: 1-2x/week  PT DURATION: 8 weeks  PLANNED INTERVENTIONS: Therapeutic exercises, Therapeutic activity, Neuromuscular re-education, Balance training, Gait training, Patient/Family education, Self Care, Aquatic Therapy, Manual therapy, and Re-evaluation  PLAN FOR NEXT SESSION: discharged 04/13/23   Mauri Reading, PT, DPT 04/13/2023, 9:39 AM

## 2023-04-29 ENCOUNTER — Ambulatory Visit: Payer: Medicare Other | Attending: Cardiology | Admitting: *Deleted

## 2023-04-29 DIAGNOSIS — Z5181 Encounter for therapeutic drug level monitoring: Secondary | ICD-10-CM | POA: Insufficient documentation

## 2023-04-29 DIAGNOSIS — I480 Paroxysmal atrial fibrillation: Secondary | ICD-10-CM | POA: Insufficient documentation

## 2023-04-29 LAB — POCT INR: INR: 2 (ref 2.0–3.0)

## 2023-04-29 NOTE — Patient Instructions (Signed)
Description   Today take 2.5 tablets of warfarin then continue taking warfarin 1.5 tablets daily except on Sundays and Thursdays take 2 tablets.  Recheck INR in 6 weeks.  [facility] [phone]

## 2023-04-30 DIAGNOSIS — M810 Age-related osteoporosis without current pathological fracture: Secondary | ICD-10-CM | POA: Diagnosis not present

## 2023-05-14 DIAGNOSIS — I129 Hypertensive chronic kidney disease with stage 1 through stage 4 chronic kidney disease, or unspecified chronic kidney disease: Secondary | ICD-10-CM | POA: Diagnosis not present

## 2023-05-14 DIAGNOSIS — N189 Chronic kidney disease, unspecified: Secondary | ICD-10-CM | POA: Diagnosis not present

## 2023-05-14 DIAGNOSIS — C61 Malignant neoplasm of prostate: Secondary | ICD-10-CM | POA: Diagnosis not present

## 2023-05-14 DIAGNOSIS — N1832 Chronic kidney disease, stage 3b: Secondary | ICD-10-CM | POA: Diagnosis not present

## 2023-05-14 DIAGNOSIS — D631 Anemia in chronic kidney disease: Secondary | ICD-10-CM | POA: Diagnosis not present

## 2023-05-14 DIAGNOSIS — I480 Paroxysmal atrial fibrillation: Secondary | ICD-10-CM | POA: Diagnosis not present

## 2023-05-16 ENCOUNTER — Other Ambulatory Visit: Payer: Self-pay | Admitting: Cardiology

## 2023-05-16 DIAGNOSIS — I4891 Unspecified atrial fibrillation: Secondary | ICD-10-CM

## 2023-06-07 ENCOUNTER — Other Ambulatory Visit: Payer: Self-pay | Admitting: Cardiology

## 2023-06-10 ENCOUNTER — Ambulatory Visit: Payer: Medicare Other | Attending: Internal Medicine | Admitting: *Deleted

## 2023-06-10 DIAGNOSIS — I480 Paroxysmal atrial fibrillation: Secondary | ICD-10-CM

## 2023-06-10 DIAGNOSIS — Z5181 Encounter for therapeutic drug level monitoring: Secondary | ICD-10-CM

## 2023-06-10 LAB — POCT INR: INR: 2.4 (ref 2.0–3.0)

## 2023-06-10 NOTE — Patient Instructions (Signed)
Description   Continue taking warfarin 1.5 tablets daily except on Sundays and Thursdays take 2 tablets.  Recheck INR in 6 weeks.  [facility] [phone]

## 2023-06-18 ENCOUNTER — Other Ambulatory Visit: Payer: Self-pay | Admitting: Cardiology

## 2023-06-18 DIAGNOSIS — R7989 Other specified abnormal findings of blood chemistry: Secondary | ICD-10-CM

## 2023-07-19 ENCOUNTER — Telehealth: Payer: Self-pay

## 2023-07-19 NOTE — Telephone Encounter (Signed)
 Auth Submission: NO AUTH NEEDED Site of care: Site of care: [facility] Payer: medicare a[employer] [place] Medication & CPT/J Code(s) submitted: Leqvio  (Inclisiran) J1306 Route of submission (phone, fax, portal):  Phone # Fax # Auth type: Buy/Bill HB Units/visits requested: 284mg  x 2 doses Reference number:  Approval from: 02/04/23 to 08/05/24

## 2023-07-22 ENCOUNTER — Ambulatory Visit: Payer: Medicare Other | Attending: Internal Medicine

## 2023-07-22 DIAGNOSIS — I480 Paroxysmal atrial fibrillation: Secondary | ICD-10-CM | POA: Insufficient documentation

## 2023-07-22 LAB — POCT INR: INR: 2.5 (ref 2.0–3.0)

## 2023-07-22 NOTE — Patient Instructions (Signed)
Description   Continue taking warfarin 1.5 tablets daily except on Sundays and Thursdays take 2 tablets.  Recheck INR in 7 weeks.  [facility] [phone]

## 2023-08-25 NOTE — Progress Notes (Unsigned)
HPI: FU coronary artery disease status post coronary bypassing graft, as well as mitral valve repair in January 2009. Pt also with h/o atrial fibrillation and on amiodarone. Nuclear study December 2022 showed ejection fraction 63% and no ischemia or infarction.  ABIs December 2022 normal on the left and noncompressible on the right. Carotid dopplers 8/23 showed 1-39 bilateral stenosis.  Echocardiogram September 2024 showed normal LV function, mild left ventricular hypertrophy, severe left atrial enlargement, status post mitral valve repair with mild to moderate mitral stenosis (mean gradient 7 mmHg), trace mitral regurgitation, mild aortic insufficiency.  Had admission for weakness/syncope July 2024 and found to have GI bleeding.  Had gastric polyp resected.  Since last seen, there is no dyspnea, chest pain, palpitations or syncope.  He states his blood pressure is running high.  Current Outpatient Medications  Medication Sig Dispense Refill   amiodarone (PACERONE) 200 MG tablet TAKE 1 TABLET BY MOUTH DAILY 90 tablet 3   amLODipine (NORVASC) 5 MG tablet TAKE 1 TABLET BY MOUTH DAILY (Patient taking differently: Take 5 mg by mouth daily.) 90 tablet 3   Cholecalciferol (VITAMIN D-3 PO) Take 1 tablet by mouth daily.     ezetimibe (ZETIA) 10 MG tablet TAKE 1 TABLET BY MOUTH  DAILY (Patient taking differently: Take 10 mg by mouth daily.) 90 tablet 0   furosemide (LASIX) 20 MG tablet Take 20 mg by mouth daily as needed for fluid.     indapamide (LOZOL) 1.25 MG tablet Take 0.625 mg by mouth daily.     levothyroxine (SYNTHROID) 25 MCG tablet TAKE 1 TABLET BY MOUTH DAILY  BEFORE BREAKFAST 90 tablet 3   losartan (COZAAR) 100 MG tablet TAKE 1 TABLET BY MOUTH DAILY 90 tablet 2   Vitamin D, Ergocalciferol, (DRISDOL) 50000 units CAPS capsule Take 1 capsule (50,000 Units total) by mouth every 7 (seven) days. 12 capsule 4   warfarin (COUMADIN) 1 MG tablet TAKE 1 AND 1/2 TO 2 TABLETS BY  MOUTH DAILY OR AS  PRESCRIBED BY  COUMADIN CLINIC 180 tablet 3   sucralfate (CARAFATE) 1 GM/10ML suspension Take 10 mLs (1 g total) by mouth 4 (four) times daily -  with meals and at bedtime for 12 days. 480 mL 0   No current facility-administered medications for this visit.     Past Medical History:  Diagnosis Date   Anxiety    Atrial fibrillation (HCC)    CAD (coronary artery disease)    s/p CABG   Carotid stenosis    mild by doppler   Complication of anesthesia    zenkers  diverticulum   Deep vein thrombophlebitis of leg (HCC)    DJD (degenerative joint disease)    Elevated TSH    secondary to Amio-normal free T4.    Essential hypertension, benign    GERD (gastroesophageal reflux disease)    Hemorrhoids    Hiatal hernia    Hyperlipidemia    PAF (paroxysmal atrial fibrillation) (HCC)    Amiodarone   PE (pulmonary embolism)    pre 2007. Chronic anticoagulation   Prostate cancer (HCC)    Sebaceous cyst    neck   Seborrheic dermatitis    Tubular adenoma of colon 05/2011    Past Surgical History:  Procedure Laterality Date   bilateral shoulder surgery     CORONARY ARTERY BYPASS GRAFT     x2    2009   ESOPHAGOGASTRODUODENOSCOPY (EGD) WITH PROPOFOL N/A 01/24/2023   Procedure: ESOPHAGOGASTRODUODENOSCOPY (EGD) WITH PROPOFOL;  Surgeon:  Napoleon Form, MD;  Location: [facility] ENDOSCOPY;  Service: Gastroenterology;  Laterality: N/A;   ESOPHAGOGASTRODUODENOSCOPY (EGD) WITH PROPOFOL N/A 01/26/2023   Procedure: ESOPHAGOGASTRODUODENOSCOPY (EGD) WITH PROPOFOL;  Surgeon: Jenel Lucks, MD;  Location: [facility] ENDOSCOPY;  Service: Gastroenterology;  Laterality: N/A;   I & D EXTREMITY  07/09/2011   Procedure: IRRIGATION AND DEBRIDEMENT EXTREMITY;  Surgeon: Tami Ribas;  Location: [facility];  Service: Orthopedics;  Laterality: Left;   left knee arthroscopy     MITRAL VALVE REPAIR     POLYPECTOMY  01/26/2023   Procedure: POLYPECTOMY;  Surgeon: Jenel Lucks, MD;  Location: [facility]  ENDOSCOPY;  Service: Gastroenterology;;   prostate cancer surgery  11/1996   radial retropubic prostatectomy   SCLEROTHERAPY  01/26/2023   Procedure: SCLEROTHERAPY;  Surgeon: Jenel Lucks, MD;  Location: [facility] ENDOSCOPY;  Service: Gastroenterology;;  Epi prior to polyp removal   TANDEM AND OVOID INSERTION     VASECTOMY     ZENKER'S DIVERTICULECTOMY N/A 11/07/2012   Procedure: ZENKER'S DIVERTICULECTOMY ENDOSCOPIC;  Surgeon: Christia Reading, MD;  Location: [facility] OR;  Service: ENT;  Laterality: N/A;    Social History   Socioeconomic History   Marital status: Married    Spouse name: margaret   Number of children: 3   Years of education: Not on file   Highest education level: Not on file  Occupational History   Occupation: retired    Associate Professor: RETIRED  Tobacco Use   Smoking status: Former    Current packs/day: 0.00    Average packs/day: 1 pack/day for 12.0 years (12.0 ttl pk-yrs)    Types: Cigarettes    Start date: 07/06/1956    Quit date: 07/06/1968    Years since quitting: 55.1   Smokeless tobacco: Never  Substance and Sexual Activity   Alcohol use: Yes    Comment: social use   Drug use: No   Sexual activity: Not Currently    Partners: Female    Comment: WIDOWED  Other Topics Concern   Not on file  Social History Narrative   Not on file   Social Drivers of Health   Financial Resource Strain: Not on file  Food Insecurity: No Food Insecurity (01/23/2023)   Hunger Vital Sign    Worried About Running Out of Food in the Last Year: Never true    Ran Out of Food in the Last Year: Never true  Transportation Needs: No Transportation Needs (02/12/2023)   PRAPARE - Administrator, Civil Service (Medical): No    Lack of Transportation (Non-Medical): No  Physical Activity: Not on file  Stress: Not on file  Social Connections: Not on file  Intimate Partner Violence: Not At Risk (01/23/2023)   Humiliation, Afraid, Rape, and Kick questionnaire    Fear of Current or Ex-Partner: No     Emotionally Abused: No    Physically Abused: No    Sexually Abused: No    Family History  Problem Relation Age of Onset   Heart attack Father    Stroke Mother    Hypertension Mother    Hypertension Brother    Heart disease Brother    Prostate cancer Brother     ROS: no fevers or chills, productive cough, hemoptysis, dysphasia, odynophagia, melena, hematochezia, dysuria, hematuria, rash, seizure activity, orthopnea, PND, pedal edema, claudication. Remaining systems are negative.  Physical Exam: Well-developed well-nourished in no acute distress.  Skin is warm and dry.  HEENT is normal.  Neck is supple.  Chest  is clear to auscultation with normal expansion.  Cardiovascular exam is regular rate and rhythm.  Abdominal exam nontender or distended. No masses palpated. Extremities show no edema. neuro grossly intact   A/P  1 paroxysmal atrial fibrillation-patient is in sinus rhythm today.  Continue amiodarone.  Check TSH, liver functions and chest x-ray.  Continue Coumadin.  Previously declined DOAC due to expense.  Check hemoglobin.  2 history of mitral valve repair-mild to moderate mitral stenosis on most recent echocardiogram.  Continue SBE prophylaxis.  3 coronary artery disease-plan to continue present medical regimen.  He is not on aspirin given need for anticoagulation.  4 hyperlipidemia-continue Zetia.  Intolerant to statins.  Check lipids.  5 hypertension-patient's blood pressure is elevated; increase amlodipine to 10 mg daily and follow.  6 carotid artery disease-minimal on most recent Dopplers.  [name], MD

## 2023-08-27 ENCOUNTER — Ambulatory Visit: Payer: Medicare Other | Attending: Cardiology | Admitting: Cardiology

## 2023-08-27 ENCOUNTER — Encounter: Payer: Self-pay | Admitting: Cardiology

## 2023-08-27 VITALS — BP 182/64 | HR 74 | Ht 68.0 in | Wt 169.6 lb

## 2023-08-27 DIAGNOSIS — I10 Essential (primary) hypertension: Secondary | ICD-10-CM | POA: Insufficient documentation

## 2023-08-27 DIAGNOSIS — I480 Paroxysmal atrial fibrillation: Secondary | ICD-10-CM | POA: Diagnosis not present

## 2023-08-27 DIAGNOSIS — Z9889 Other specified postprocedural states: Secondary | ICD-10-CM | POA: Insufficient documentation

## 2023-08-27 DIAGNOSIS — E785 Hyperlipidemia, unspecified: Secondary | ICD-10-CM | POA: Diagnosis not present

## 2023-08-27 DIAGNOSIS — Z5181 Encounter for therapeutic drug level monitoring: Secondary | ICD-10-CM | POA: Insufficient documentation

## 2023-08-27 DIAGNOSIS — Z951 Presence of aortocoronary bypass graft: Secondary | ICD-10-CM | POA: Diagnosis not present

## 2023-08-27 LAB — CBC

## 2023-08-27 MED ORDER — AMLODIPINE BESYLATE 10 MG PO TABS: 10.0000 mg | ORAL_TABLET | Freq: Every day | ORAL | 3 refills | Status: DC

## 2023-08-27 NOTE — Patient Instructions (Addendum)
Medication Instructions:  Your physician has recommended you make the following change in your medication:   -Increase Amlodipine (norvasc) to 10mg  daily.  *If you need a refill on your cardiac medications before your next appointment, please call your pharmacy*   Lab Work: Your physician recommends that you have labs drawn today: CMET, CBC, Lipids, TSH, Free T4  If you have labs (blood work) drawn today and your tests are completely normal, you will receive your results only by: MyChart Message (if you have MyChart) OR A paper copy in the mail If you have any lab test that is abnormal or we need to change your treatment, we will call you to review the results.   Testing/Procedures: The chest X ray has been ordered ---go to [facility] at anytime- you do not need an appointment   --[address]--  Follow-Up: At [facility], you and your health needs are our priority.  As part of our continuing mission to provide you with exceptional heart care, we have created designated Provider Care Teams.  These Care Teams include your primary Cardiologist (physician) and Advanced Practice Providers (APPs -  Physician Assistants and Nurse Practitioners) who all work together to provide you with the care you need, when you need it.  We recommend signing up for the patient portal called "MyChart".  Sign up information is provided on this After Visit Summary.  MyChart is used to connect with patients for Virtual Visits (Telemedicine).  Patients are able to view lab/test results, encounter notes, upcoming appointments, etc.  Non-urgent messages can be sent to your provider as well.   To learn more about what you can do with MyChart, go to [email or web address].    Your next appointment:   6 month(s)  Provider:   [name], MD     Other Instructions   1st Floor: - Lobby - Registration  - Pharmacy  - Lab - Cafe  2nd Floor: - PV Lab - Diagnostic Testing (echo,  CT, nuclear med)  3rd Floor: - Vacant  4th Floor: - TCTS (cardiothoracic surgery) - AFib Clinic - Structural Heart Clinic - Vascular Surgery  - Vascular Ultrasound  5th Floor: - HeartCare Cardiology (general and EP) - Clinical Pharmacy for coumadin, hypertension, lipid, weight-loss medications, and med management appointments    Valet parking services will be available as well.

## 2023-08-28 LAB — CBC
Hematocrit: 44.2 % (ref 37.5–51.0)
Hemoglobin: 14.4 g/dL (ref 13.0–17.7)
MCH: 28.1 pg (ref 26.6–33.0)
MCHC: 32.6 g/dL (ref 31.5–35.7)
MCV: 86 fL (ref 79–97)
Platelets: 275 10*3/uL (ref 150–450)
RBC: 5.13 x10E6/uL (ref 4.14–5.80)
RDW: 15.6 % — ABNORMAL HIGH (ref 11.6–15.4)
WBC: 6 10*3/uL (ref 3.4–10.8)

## 2023-08-28 LAB — LIPID PANEL
Chol/HDL Ratio: 2.3 {ratio} (ref 0.0–5.0)
Cholesterol, Total: 180 mg/dL (ref 100–199)
HDL: 80 mg/dL (ref >39)
LDL Chol Calc (NIH): 79 mg/dL (ref 0–99)
Triglycerides: 121 mg/dL (ref 0–149)
VLDL Cholesterol Cal: 21 mg/dL (ref 5–40)

## 2023-08-28 LAB — TSH+FREE T4
Free T4: 1.5 ng/dL (ref 0.82–1.77)
TSH: 8.71 u[IU]/mL — ABNORMAL HIGH (ref 0.450–4.500)

## 2023-08-28 LAB — COMPREHENSIVE METABOLIC PANEL
ALT: 28 [IU]/L (ref 0–44)
AST: 33 [IU]/L (ref 0–40)
Albumin: 4.6 g/dL (ref 3.7–4.7)
Alkaline Phosphatase: 142 [IU]/L — ABNORMAL HIGH (ref 44–121)
BUN/Creatinine Ratio: 19 (ref 10–24)
BUN: 30 mg/dL — ABNORMAL HIGH (ref 8–27)
Bilirubin Total: 0.5 mg/dL (ref 0.0–1.2)
CO2: 22 mmol/L (ref 20–29)
Calcium: 9.8 mg/dL (ref 8.6–10.2)
Chloride: 103 mmol/L (ref 96–106)
Creatinine, Ser: 1.6 mg/dL — ABNORMAL HIGH (ref 0.76–1.27)
Globulin, Total: 2.6 g/dL (ref 1.5–4.5)
Glucose: 97 mg/dL (ref 70–99)
Potassium: 5.4 mmol/L — ABNORMAL HIGH (ref 3.5–5.2)
Sodium: 141 mmol/L (ref 134–144)
Total Protein: 7.2 g/dL (ref 6.0–8.5)
eGFR: 42 mL/min/{1.73_m2} — ABNORMAL LOW (ref >59)

## 2023-09-01 ENCOUNTER — Other Ambulatory Visit: Payer: Self-pay | Admitting: Cardiology

## 2023-09-01 ENCOUNTER — Ambulatory Visit
Admission: RE | Admit: 2023-09-01 | Discharge: 2023-09-01 | Disposition: A | Discharge status: Home / Self Care | Payer: Medicare Other | Source: Ambulatory Visit | Attending: Cardiology | Admitting: Cardiology

## 2023-09-01 DIAGNOSIS — I480 Paroxysmal atrial fibrillation: Secondary | ICD-10-CM

## 2023-09-08 ENCOUNTER — Ambulatory Visit (INDEPENDENT_AMBULATORY_CARE_PROVIDER_SITE_OTHER): Payer: Medicare Other

## 2023-09-08 VITALS — BP 173/62 | HR 74 | Temp 98.5°F | Resp 16 | Ht 68.0 in | Wt 171.0 lb

## 2023-09-08 DIAGNOSIS — I739 Peripheral vascular disease, unspecified: Secondary | ICD-10-CM

## 2023-09-08 DIAGNOSIS — E7800 Pure hypercholesterolemia, unspecified: Secondary | ICD-10-CM | POA: Diagnosis not present

## 2023-09-08 MED ORDER — INCLISIRAN SODIUM 284 MG/1.5ML ~~LOC~~ SOSY
284.0000 mg | PREFILLED_SYRINGE | Freq: Once | SUBCUTANEOUS | Status: AC
  Administered 2023-09-08: 284 mg via SUBCUTANEOUS
  Filled 2023-09-08: qty 1.5

## 2023-09-08 NOTE — Progress Notes (Signed)
 Diagnosis: Hyperlipidemia  Provider:  Chilton Greathouse MD  Procedure: Injection  Leqvio (inclisiran), Dose: 284 mg, Site: subcutaneous, Number of injections: 1  Injection Site(s): Left arm  Post Care: Patient declined observation  Discharge: Condition: Good, Destination: Home . AVS Provided  Performed by:  Wyvonne Lenz, RN

## 2023-09-09 ENCOUNTER — Ambulatory Visit: Payer: Medicare Other | Attending: Cardiology | Admitting: Pharmacist Clinician (PhC)/ Clinical Pharmacy Specialist

## 2023-09-09 DIAGNOSIS — I480 Paroxysmal atrial fibrillation: Secondary | ICD-10-CM | POA: Diagnosis not present

## 2023-09-09 LAB — POCT INR: INR: 2.5 (ref 2.0–3.0)

## 2023-09-09 NOTE — Patient Instructions (Signed)
 Continue taking warfarin 1.5 tablets daily except on Sundays and Thursdays take 2 tablets.  Recheck INR in 6 weeks.  Coumadin Clinic [phone]

## 2023-09-15 DIAGNOSIS — N1832 Chronic kidney disease, stage 3b: Secondary | ICD-10-CM | POA: Diagnosis not present

## 2023-09-21 DIAGNOSIS — I480 Paroxysmal atrial fibrillation: Secondary | ICD-10-CM | POA: Diagnosis not present

## 2023-09-21 DIAGNOSIS — N1832 Chronic kidney disease, stage 3b: Secondary | ICD-10-CM | POA: Diagnosis not present

## 2023-09-21 DIAGNOSIS — I129 Hypertensive chronic kidney disease with stage 1 through stage 4 chronic kidney disease, or unspecified chronic kidney disease: Secondary | ICD-10-CM | POA: Diagnosis not present

## 2023-09-21 DIAGNOSIS — D631 Anemia in chronic kidney disease: Secondary | ICD-10-CM | POA: Diagnosis not present

## 2023-09-21 DIAGNOSIS — N189 Chronic kidney disease, unspecified: Secondary | ICD-10-CM | POA: Diagnosis not present

## 2023-10-21 ENCOUNTER — Ambulatory Visit: Attending: Cardiology | Admitting: *Deleted

## 2023-10-21 ENCOUNTER — Other Ambulatory Visit: Payer: Self-pay | Admitting: *Deleted

## 2023-10-21 DIAGNOSIS — I480 Paroxysmal atrial fibrillation: Secondary | ICD-10-CM | POA: Diagnosis not present

## 2023-10-21 DIAGNOSIS — Z5181 Encounter for therapeutic drug level monitoring: Secondary | ICD-10-CM | POA: Diagnosis not present

## 2023-10-21 LAB — POCT INR: POC INR: 2

## 2023-10-21 MED ORDER — EZETIMIBE 10 MG PO TABS
10.0000 mg | ORAL_TABLET | Freq: Every day | ORAL | 1 refills | Status: DC
Start: 2023-10-21 — End: 2024-03-01

## 2023-10-21 NOTE — Telephone Encounter (Signed)
 Pt is requesting a refill on medication ezetimibe. This medication was prescribe by another doctor and has not been refilled since 2020. Dr. Audery Blazing did not prescribe this medication. Would Dr. Audery Blazing like to refill this medication? Please address

## 2023-10-21 NOTE — Patient Instructions (Addendum)
 Description   Continue taking warfarin 1.5 tablets daily except on Sundays and Thursdays take 2 tablets.  Recheck INR in 6 weeks.  Coumadin Clinic [phone]      1st Floor: - Lobby - Registration  - Pharmacy  - Lab - Cafe  2nd Floor: - PV Lab - Diagnostic Testing (echo, CT, nuclear med)  3rd Floor: - Vacant  4th Floor: - TCTS (cardiothoracic surgery) - AFib Clinic - Structural Heart Clinic - Vascular Surgery  - Vascular Ultrasound  5th Floor: - HeartCare Cardiology (general and EP) - Clinical Pharmacy for coumadin, hypertension, lipid, weight-loss medications, and med management appointments    Valet parking services will be available as well.

## 2023-10-21 NOTE — Telephone Encounter (Addendum)
 Pt would like a refill of Zetia 10 mg daily. He would like refill sent to [facility].

## 2023-11-01 DIAGNOSIS — K5190 Ulcerative colitis, unspecified, without complications: Secondary | ICD-10-CM | POA: Diagnosis not present

## 2023-11-04 DIAGNOSIS — E039 Hypothyroidism, unspecified: Secondary | ICD-10-CM | POA: Diagnosis not present

## 2023-11-04 DIAGNOSIS — R251 Tremor, unspecified: Secondary | ICD-10-CM | POA: Diagnosis not present

## 2023-11-04 DIAGNOSIS — M810 Age-related osteoporosis without current pathological fracture: Secondary | ICD-10-CM | POA: Diagnosis not present

## 2023-11-04 DIAGNOSIS — R1084 Generalized abdominal pain: Secondary | ICD-10-CM | POA: Diagnosis not present

## 2023-11-04 DIAGNOSIS — M5459 Other low back pain: Secondary | ICD-10-CM | POA: Diagnosis not present

## 2023-11-05 DIAGNOSIS — M5459 Other low back pain: Secondary | ICD-10-CM | POA: Diagnosis not present

## 2023-11-08 ENCOUNTER — Encounter: Payer: Self-pay | Admitting: Cardiology

## 2023-11-11 DIAGNOSIS — M545 Low back pain, unspecified: Secondary | ICD-10-CM | POA: Diagnosis not present

## 2023-11-11 DIAGNOSIS — R748 Abnormal levels of other serum enzymes: Secondary | ICD-10-CM | POA: Diagnosis not present

## 2023-11-11 DIAGNOSIS — K5909 Other constipation: Secondary | ICD-10-CM | POA: Diagnosis not present

## 2023-11-11 DIAGNOSIS — G8929 Other chronic pain: Secondary | ICD-10-CM | POA: Diagnosis not present

## 2023-11-15 NOTE — Progress Notes (Deleted)
    John [name] D.[name] [address] [place] [address] Phone: [phone]   Assessment and Plan:     There are no diagnoses linked to this encounter.  ***   Pertinent previous records reviewed include ***    Follow Up: ***     Subjective:   I, John [name], am serving as a Neurosurgeon for Doctor John [name]  Chief Complaint: glute pain   HPI:   11/16/2023 Patient is a 88 year old male with glute pain. Patient states   Relevant Historical Information: ***  Additional pertinent review of systems negative.   Current Outpatient Medications:    amiodarone  (PACERONE ) 200 MG tablet, TAKE 1 TABLET BY MOUTH DAILY, Disp: 90 tablet, Rfl: 3   amLODipine  (NORVASC ) 10 MG tablet, Take 1 tablet (10 mg total) by mouth daily., Disp: 90 tablet, Rfl: 3   Cholecalciferol  (VITAMIN D -3 PO), Take 1 tablet by mouth daily., Disp: , Rfl:    ezetimibe  (ZETIA ) 10 MG tablet, Take 1 tablet (10 mg total) by mouth daily., Disp: 90 tablet, Rfl: 1   furosemide  (LASIX ) 20 MG tablet, Take 20 mg by mouth daily as needed for fluid., Disp: , Rfl:    indapamide  (LOZOL ) 1.25 MG tablet, Take 0.625 mg by mouth daily., Disp: , Rfl:    levothyroxine  (SYNTHROID ) 25 MCG tablet, TAKE 1 TABLET BY MOUTH DAILY  BEFORE BREAKFAST, Disp: 90 tablet, Rfl: 3   losartan  (COZAAR ) 100 MG tablet, TAKE 1 TABLET BY MOUTH DAILY, Disp: 90 tablet, Rfl: 2   sucralfate  (CARAFATE ) 1 GM/10ML suspension, Take 10 mLs (1 g total) by mouth 4 (four) times daily -  with meals and at bedtime for 12 days., Disp: 480 mL, Rfl: 0   Vitamin D , Ergocalciferol , (DRISDOL ) 50000 units CAPS capsule, Take 1 capsule (50,000 Units total) by mouth every 7 (seven) days., Disp: 12 capsule, Rfl: 4   warfarin (COUMADIN ) 1 MG tablet, TAKE 1 AND 1/2 TO 2 TABLETS BY  MOUTH DAILY OR AS PRESCRIBED BY  COUMADIN  CLINIC, Disp: 180 tablet, Rfl: 3   Objective:     There were no vitals filed for this visit.    There is no  height or weight on file to calculate BMI.    Physical Exam:    ***   Electronically signed by:  John [name] D.[name] 7:30 AM 11/15/23

## 2023-11-16 ENCOUNTER — Ambulatory Visit: Admitting: Sports Medicine

## 2023-12-02 ENCOUNTER — Ambulatory Visit: Attending: Cardiology

## 2023-12-02 DIAGNOSIS — I480 Paroxysmal atrial fibrillation: Secondary | ICD-10-CM | POA: Diagnosis not present

## 2023-12-02 LAB — POCT INR: INR: 2.2 (ref 2.0–3.0)

## 2023-12-02 NOTE — Patient Instructions (Signed)
 Description   Continue taking warfarin 1.5 tablets daily except on Sundays and Thursdays take 2 tablets.  Recheck INR in 7 weeks.  Coumadin Clinic [phone]

## 2023-12-09 DIAGNOSIS — E782 Mixed hyperlipidemia: Secondary | ICD-10-CM | POA: Diagnosis not present

## 2023-12-09 DIAGNOSIS — I10 Essential (primary) hypertension: Secondary | ICD-10-CM | POA: Diagnosis not present

## 2023-12-09 DIAGNOSIS — R748 Abnormal levels of other serum enzymes: Secondary | ICD-10-CM | POA: Diagnosis not present

## 2023-12-21 DIAGNOSIS — I6529 Occlusion and stenosis of unspecified carotid artery: Secondary | ICD-10-CM | POA: Diagnosis not present

## 2023-12-21 DIAGNOSIS — I2510 Atherosclerotic heart disease of native coronary artery without angina pectoris: Secondary | ICD-10-CM | POA: Diagnosis not present

## 2023-12-21 DIAGNOSIS — N1832 Chronic kidney disease, stage 3b: Secondary | ICD-10-CM | POA: Diagnosis not present

## 2023-12-21 DIAGNOSIS — G720 Drug-induced myopathy: Secondary | ICD-10-CM | POA: Diagnosis not present

## 2023-12-21 DIAGNOSIS — R1013 Epigastric pain: Secondary | ICD-10-CM | POA: Diagnosis not present

## 2023-12-21 DIAGNOSIS — Z7901 Long term (current) use of anticoagulants: Secondary | ICD-10-CM | POA: Diagnosis not present

## 2023-12-21 DIAGNOSIS — D689 Coagulation defect, unspecified: Secondary | ICD-10-CM | POA: Diagnosis not present

## 2023-12-21 DIAGNOSIS — I4891 Unspecified atrial fibrillation: Secondary | ICD-10-CM | POA: Diagnosis not present

## 2023-12-21 DIAGNOSIS — I10 Essential (primary) hypertension: Secondary | ICD-10-CM | POA: Diagnosis not present

## 2023-12-21 DIAGNOSIS — E039 Hypothyroidism, unspecified: Secondary | ICD-10-CM | POA: Diagnosis not present

## 2023-12-21 DIAGNOSIS — Z0000 Encounter for general adult medical examination without abnormal findings: Secondary | ICD-10-CM | POA: Diagnosis not present

## 2024-01-12 ENCOUNTER — Encounter: Payer: Self-pay | Admitting: Physician Assistant

## 2024-01-12 ENCOUNTER — Ambulatory Visit (INDEPENDENT_AMBULATORY_CARE_PROVIDER_SITE_OTHER): Admitting: Physician Assistant

## 2024-01-12 VITALS — BP 136/68 | HR 74 | Ht 68.0 in | Wt 164.4 lb

## 2024-01-12 DIAGNOSIS — R1084 Generalized abdominal pain: Secondary | ICD-10-CM

## 2024-01-12 DIAGNOSIS — K5900 Constipation, unspecified: Secondary | ICD-10-CM

## 2024-01-12 MED ORDER — LINACLOTIDE 145 MCG PO CAPS: 145.0000 ug | ORAL_CAPSULE | Freq: Every day | ORAL | 11 refills | Status: DC

## 2024-01-12 NOTE — Patient Instructions (Signed)
 We have sent the following medications to your pharmacy for you to pick up at your convenience: Linzess  145 mcg daily  Please call our office at [phone] in 2 weeks if pain has not improved.   _______________________________________________________  If your blood pressure at your visit was 140/90 or greater, please contact your primary care physician to follow up on this.  _______________________________________________________  If you are age 86 or older, your body mass index should be between 23-30. Your Body mass index is 24.99 kg/m. If this is out of the aforementioned range listed, please consider follow up with your Primary Care Provider.  If you are age 32 or younger, your body mass index should be between 19-25. Your Body mass index is 24.99 kg/m. If this is out of the aformentioned range listed, please consider follow up with your Primary Care Provider.   ________________________________________________________  The [facility] providers would like to encourage you to use MYCHART to communicate with providers for non-urgent requests or questions.  Due to long hold times on the telephone, sending your provider a message by [facility] may be a faster and more efficient way to get a response.  Please allow 48 business hours for a response.  Please remember that this is for non-urgent requests.  _______________________________________________________

## 2024-01-12 NOTE — Progress Notes (Signed)
 Chief Complaint: Constipation and abdominal pain  HPI:    John [name] is an 88 year old male with a past medical history as listed below including A-fib and CAD status post CABG on Coumadin  as well as carotid stenosis (9//24 echo with LVEF 60-65%) and multiple others, previously known to Dr. Aneita, who was referred to me by Clarice Nottingham, MD for a complaint of constipation and abdominal pain.      09/17/2016 patient seen in clinic by Greig Corti, PA to discuss recall colonoscopy and complaints of dysphagia.  He has CAD status post CABG, previous mitral valve repair and atrial fibrillation history of DVT with history of Zenker's diverticulum for which he underwent diverticulectomy and history of prostate cancer.  At that time scheduled for EGD and colonoscopy.    11/17/2016 colonoscopy with four 6-7 mm polyps in the descending colon transverse colon as well as internal hemorrhoids.  Pathology showed adenomatous polyps.  No repeat recommended due to age.    01/24/23 EGD for GI bleed with Z-line regular and normal esophagus.  Single gastric cardia polyp that hyperplastic/inflammatory.  INR was 2.1 so this was not removed.  EGD repeated the next day for removal.    01/26/2023 CTAP without contrast with minimal left pleural effusion and diverticulosis.    08/27/2023 CMP with a creatinine of 1.60 (around baseline), BUN 30, potassium 5.4, alk phos 142.  CBC normal.    Today, patient presents to clinic and tells me over the past 6 months he has been having trouble with constipation, prior to this would use MiraLAX  but it really stopped working for him.  He was just started on Linzess  72 mcg about a month ago and feels like this does help but he will not have a bowel movement still for 2 to 3 days and when he does he feels like he has to initially force it to come out and then it seems to be mostly liquidy stool.  Describes generalized abdominal discomfort which is some better after having a good bowel movement every  third day, but this discomfort is bothersome to him especially when he lays down at night, it seems like he just can't get comfortable and finds himself up and walking around.  He does continually pass gas.  No weight loss.  No blood in his stool.    Describes that he is going through selling his house and actually has movers there right now.  Sounds like it has been stressful for him.    Denies fever, chills or symptoms that awaken him from sleep.  Past Medical History:  Diagnosis Date   Anxiety    Atrial fibrillation (HCC)    CAD (coronary artery disease)    s/p CABG   Carotid stenosis    mild by doppler   Complication of anesthesia    zenkers  diverticulum   Deep vein thrombophlebitis of leg (HCC)    DJD (degenerative joint disease)    Elevated TSH    secondary to Amio-normal free T4.    Essential hypertension, benign    GERD (gastroesophageal reflux disease)    Hemorrhoids    Hiatal hernia    Hyperlipidemia    PAF (paroxysmal atrial fibrillation) (HCC)    Amiodarone    PE (pulmonary embolism)    pre 2007. Chronic anticoagulation   Prostate cancer (HCC)    Sebaceous cyst    neck   Seborrheic dermatitis    Tubular adenoma of colon 05/2011    Past Surgical History:  Procedure  Laterality Date   bilateral shoulder surgery     CORONARY ARTERY BYPASS GRAFT     x2    2009   ESOPHAGOGASTRODUODENOSCOPY (EGD) WITH PROPOFOL  N/A 01/24/2023   Procedure: ESOPHAGOGASTRODUODENOSCOPY (EGD) WITH PROPOFOL ;  Surgeon: Shila Gustav GAILS, MD;  Location: [facility] ENDOSCOPY;  Service: Gastroenterology;  Laterality: N/A;   ESOPHAGOGASTRODUODENOSCOPY (EGD) WITH PROPOFOL  N/A 01/26/2023   Procedure: ESOPHAGOGASTRODUODENOSCOPY (EGD) WITH PROPOFOL ;  Surgeon: Stacia Glendia BRAVO, MD;  Location: [facility] ENDOSCOPY;  Service: Gastroenterology;  Laterality: N/A;   I & D EXTREMITY  07/09/2011   Procedure: IRRIGATION AND DEBRIDEMENT EXTREMITY;  Surgeon: Franky JONELLE Curia;  Location: [facility];  Service:  Orthopedics;  Laterality: Left;   left knee arthroscopy     MITRAL VALVE REPAIR     POLYPECTOMY  01/26/2023   Procedure: POLYPECTOMY;  Surgeon: Stacia Glendia BRAVO, MD;  Location: [facility] ENDOSCOPY;  Service: Gastroenterology;;   prostate cancer surgery  11/1996   radial retropubic prostatectomy   SCLEROTHERAPY  01/26/2023   Procedure: SCLEROTHERAPY;  Surgeon: Stacia Glendia BRAVO, MD;  Location: [facility] ENDOSCOPY;  Service: Gastroenterology;;  Epi prior to polyp removal   TANDEM AND OVOID INSERTION     VASECTOMY     ZENKER'S DIVERTICULECTOMY N/A 11/07/2012   Procedure: ZENKER'S DIVERTICULECTOMY ENDOSCOPIC;  Surgeon: Vaughan Ricker, MD;  Location[facility] OR;  Service: ENT;  Laterality: N/A;    Current Outpatient Medications  Medication Sig Dispense Refill   amiodarone  (PACERONE ) 200 MG tablet TAKE 1 TABLET BY MOUTH DAILY 90 tablet 3   amLODipine  (NORVASC ) 10 MG tablet Take 1 tablet (10 mg total) by mouth daily. 90 tablet 3   Cholecalciferol  (VITAMIN D -3 PO) Take 1 tablet by mouth daily.     ezetimibe  (ZETIA ) 10 MG tablet Take 1 tablet (10 mg total) by mouth daily. 90 tablet 1   furosemide  (LASIX ) 20 MG tablet Take 20 mg by mouth daily as needed for fluid.     indapamide  (LOZOL ) 1.25 MG tablet Take 0.625 mg by mouth daily.     levothyroxine  (SYNTHROID ) 25 MCG tablet TAKE 1 TABLET BY MOUTH DAILY  BEFORE BREAKFAST 90 tablet 3   LINZESS  72 MCG capsule Take 72 mcg by mouth every morning.     losartan  (COZAAR ) 100 MG tablet TAKE 1 TABLET BY MOUTH DAILY 90 tablet 2   Vitamin D , Ergocalciferol , (DRISDOL ) 50000 units CAPS capsule Take 1 capsule (50,000 Units total) by mouth every 7 (seven) days. 12 capsule 4   warfarin (COUMADIN ) 1 MG tablet TAKE 1 AND 1/2 TO 2 TABLETS BY  MOUTH DAILY OR AS PRESCRIBED BY  COUMADIN  CLINIC 180 tablet 3   No current facility-administered medications for this visit.    Allergies as of 01/12/2024 - Review Complete 01/12/2024  Allergen Reaction Noted   Iodine Hives 12/11/2008    Statins Other (See Comments)     Family History  Problem Relation Age of Onset   Stroke Mother    Hypertension Mother    Heart attack Father    Esophageal cancer Brother    Hypertension Brother    Heart disease Brother    Prostate cancer Brother    Colon cancer Neg Hx    Pancreatic cancer Neg Hx    Stomach cancer Neg Hx     Social History   Socioeconomic History   Marital status: Married    Spouse name: margaret   Number of children: 3   Years of education: Not on file   Highest education level: Not on  file  Occupational History   Occupation: retired    Associate Professor: RETIRED  Tobacco Use   Smoking status: Former    Current packs/day: 0.00    Average packs/day: 1 pack/day for 12.0 years (12.0 ttl pk-yrs)    Types: Cigarettes    Start date: 07/06/1956    Quit date: 07/06/1968    Years since quitting: 55.5   Smokeless tobacco: Never  Vaping Use   Vaping status: Never Used  Substance and Sexual Activity   Alcohol use: Not Currently   Drug use: No   Sexual activity: Not Currently    Partners: Female    Comment: WIDOWED  Other Topics Concern   Not on file  Social History Narrative   Not on file   Social Drivers of Health   Financial Resource Strain: Not on file  Food Insecurity: No Food Insecurity (01/23/2023)   Hunger Vital Sign    Worried About Running Out of Food in the Last Year: Never true    Ran Out of Food in the Last Year: Never true  Transportation Needs: No Transportation Needs (02/12/2023)   PRAPARE - Administrator, Civil Service (Medical): No    Lack of Transportation (Non-Medical): No  Physical Activity: Not on file  Stress: Not on file  Social Connections: Not on file  Intimate Partner Violence: Not At Risk (01/23/2023)   Humiliation, Afraid, Rape, and Kick questionnaire    Fear of Current or Ex-Partner: No    Emotionally Abused: No    Physically Abused: No    Sexually Abused: No    Review of Systems:    Constitutional: No weight loss,  fever or chills Skin: No rash  Cardiovascular: No chest pain Respiratory: No SOB Gastrointestinal: See HPI and otherwise negative Genitourinary: No dysuria Neurological: No headache, dizziness or syncope Musculoskeletal: No new muscle or joint pain Hematologic: No bleeding  Psychiatric: No history of depression or anxiety   Physical Exam:  Vital signs: BP 136/68   Pulse 74   Ht 5' 8 (1.727 m)   Wt 164 lb 6 oz (74.6 kg)   SpO2 97%   BMI 24.99 kg/m    Constitutional:   Pleasant elderly Caucasian male appears to be in NAD, Well developed, Well nourished, alert and cooperative Head:  Normocephalic and atraumatic. Eyes:   PEERL, EOMI. No icterus. Conjunctiva pink. Ears:  Normal auditory acuity. Neck:  Supple Throat: Oral cavity and pharynx without inflammation, swelling or lesion.  Respiratory: Respirations even and unlabored. Lungs clear to auscultation bilaterally.   No wheezes, crackles, or rhonchi.  Cardiovascular: Normal S1, S2. No MRG. Regular rate and rhythm. No peripheral edema, cyanosis or pallor.  Gastrointestinal:  Soft, nondistended, mild generalized TTP. No rebound or guarding. Normal bowel sounds. No appreciable masses or hepatomegaly. Rectal:  Not performed.  Msk:  Symmetrical without gross deformities. Without edema, no deformity or joint abnormality.  Neurologic:  Alert and  oriented x4;  grossly normal neurologically.  Skin:   Dry and intact without significant lesions or rashes. Psychiatric: Demonstrates good judgement and reason without abnormal affect or behaviors.  RELEVANT LABS AND IMAGING: CBC    Component Value Date/Time   WBC 6.0 08/27/2023 0826   WBC 7.3 01/28/2023 0648   RBC 5.13 08/27/2023 0826   RBC 2.88 (L) 01/28/2023 0648   HGB 14.4 08/27/2023 0826   HCT 44.2 08/27/2023 0826   PLT 275 08/27/2023 0826   MCV 86 08/27/2023 0826   MCH 28.1 08/27/2023 0826  MCH 29.5 01/28/2023 0648   MCHC 32.6 08/27/2023 0826   MCHC 33.6 01/28/2023 0648    RDW 15.6 (H) 08/27/2023 0826   LYMPHSABS 0.9 01/28/2023 0648   LYMPHSABS 1.1 05/16/2018 0827   MONOABS 0.7 01/28/2023 0648   EOSABS 0.3 01/28/2023 0648   EOSABS 0.1 05/16/2018 0827   BASOSABS 0.0 01/28/2023 0648   BASOSABS 0.0 05/16/2018 0827    CMP     Component Value Date/Time   NA 141 08/27/2023 0826   K 5.4 (H) 08/27/2023 0826   CL 103 08/27/2023 0826   CO2 22 08/27/2023 0826   GLUCOSE 97 08/27/2023 0826   GLUCOSE 99 01/28/2023 0648   BUN 30 (H) 08/27/2023 0826   CREATININE 1.60 (H) 08/27/2023 0826   CALCIUM 9.8 08/27/2023 0826   PROT 7.2 08/27/2023 0826   ALBUMIN 4.6 08/27/2023 0826   AST 33 08/27/2023 0826   ALT 28 08/27/2023 0826   ALKPHOS 142 (H) 08/27/2023 0826   BILITOT 0.5 08/27/2023 0826   GFRNONAA 48 (L) 01/28/2023 0648   GFRAA 52 (L) 01/09/2020 1025    Assessment: 1.  Constipation: Over the past 6 months has been more constipated with MiraLAX  unable to help, he was recently started on Linzess  72 mcg which has helped some but still only has a bowel movement every 3 days with some generalized abdominal discomfort; likely slow transit/age contributing 2.  Generalized abdominal pain: Likely with above as this is some better after he is able to have a good bowel movement, reviewed last CT about a year ago with diverticulosis but no other abnormality  Plan: 1.  At this point we will trial an increase in Linzess  to 145 mcg.  Prescribed today #30 with 5 refills.  He can also take 2 of his 72 mcg pills. 2.  Discussed that if above is not helping over the next month then he needs to let us  know.  Especially if it does not help with his abdominal pain when he feels like he is having better bowel movements.  At that point recommend a repeat CT, likely without contrast, but may need to repeat BMP prior. 3.  Patient to follow in clinic in 2 to 3 months or sooner if necessary. Assigned to Dr. Nandigam today.  John Failing, PA-C [facility] 01/12/2024, 10:56  AM  Cc: Clarice Nottingham, MD

## 2024-01-20 ENCOUNTER — Ambulatory Visit: Attending: Cardiology

## 2024-01-20 DIAGNOSIS — I480 Paroxysmal atrial fibrillation: Secondary | ICD-10-CM | POA: Insufficient documentation

## 2024-01-20 LAB — POCT INR: INR: 2.8 (ref 2.0–3.0)

## 2024-01-20 NOTE — Patient Instructions (Signed)
 Description   Continue taking warfarin 1.5 tablets daily except on Sundays and Thursdays take 2 tablets.  Recheck INR in 7 weeks.  Coumadin Clinic [phone]

## 2024-01-20 NOTE — Progress Notes (Signed)
Please see anticoagulation encounter.

## 2024-02-02 ENCOUNTER — Other Ambulatory Visit: Payer: Self-pay | Admitting: Cardiology

## 2024-02-02 DIAGNOSIS — I4891 Unspecified atrial fibrillation: Secondary | ICD-10-CM

## 2024-02-03 DIAGNOSIS — H43812 Vitreous degeneration, left eye: Secondary | ICD-10-CM | POA: Diagnosis not present

## 2024-02-03 DIAGNOSIS — H35371 Puckering of macula, right eye: Secondary | ICD-10-CM | POA: Diagnosis not present

## 2024-02-09 ENCOUNTER — Other Ambulatory Visit: Payer: Self-pay | Admitting: Cardiology

## 2024-02-17 ENCOUNTER — Ambulatory Visit (INDEPENDENT_AMBULATORY_CARE_PROVIDER_SITE_OTHER): Admitting: Diagnostic Neuroimaging

## 2024-02-17 ENCOUNTER — Encounter: Payer: Self-pay | Admitting: Diagnostic Neuroimaging

## 2024-02-17 VITALS — BP 158/64 | HR 60 | Ht 68.0 in | Wt 170.0 lb

## 2024-02-17 DIAGNOSIS — G250 Essential tremor: Secondary | ICD-10-CM

## 2024-02-17 NOTE — Progress Notes (Signed)
 [facility]  PATIENT: John [name] DOB: 1936/04/01  REFERRING CLINICIAN: Clarice Nottingham, MD HISTORY FROM: patient  REASON FOR VISIT: new consult   HISTORICAL  CHIEF COMPLAINT:  Chief Complaint  Patient presents with   New Patient (Initial Visit)    Pt alone, rm 7. States that he has been having difficulty with hand tremors bilaterally. States that this has been going on for a yr or so but has def notices there is worsening in the tremors and worse on left. He is left handed. He lost his wife 1.5 yr. He has noted more in fine motor movement. When he goes to pick up something and then the hand shakes.     HISTORY OF PRESENT ILLNESS:   88 year old left-handed male here for evaluation of tremors.  For past 6 to 12 months patient's had gradual onset, progressive left greater than right hand tremor.  Also notes some tremor in voice.  Symptoms got immersing her time.  Family history of tremor in mother and brother.  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  He notes some gait and balance difficulty due to this problem.   REVIEW OF SYSTEMS: Full 14 system review of systems performed and negative with exception of: as per HPI.  ALLERGIES: Allergies  Allergen Reactions   Iodine Hives   Statins Other (See Comments)    Joint aches with Zocor, Lipitor, Crestor    HOME MEDICATIONS: Outpatient Medications Prior to Visit  Medication Sig Dispense Refill   amiodarone  (PACERONE ) 200 MG tablet TAKE 1 TABLET BY MOUTH DAILY 90 tablet 3   amLODipine  (NORVASC ) 10 MG tablet Take 1 tablet (10 mg total) by mouth daily. 90 tablet 3   Bisoprolol Fumarate 2.5 MG TABS Take 2.5 mg by mouth daily.     Cholecalciferol  (VITAMIN D -3 PO) Take 1 tablet by mouth daily.     ezetimibe  (ZETIA ) 10 MG tablet Take 1 tablet (10 mg total) by mouth daily. 90 tablet 1   furosemide  (LASIX ) 20 MG tablet Take 20 mg by mouth daily as needed for fluid.     indapamide   (LOZOL ) 1.25 MG tablet Take 0.625 mg by mouth daily.     levothyroxine  (SYNTHROID ) 25 MCG tablet TAKE 1 TABLET BY MOUTH DAILY  BEFORE BREAKFAST 90 tablet 3   linaclotide  (LINZESS ) 145 MCG CAPS capsule Take 1 capsule (145 mcg total) by mouth daily. 30 capsule 11   losartan  (COZAAR ) 100 MG tablet TAKE 1 TABLET BY MOUTH DAILY 90 tablet 3   pantoprazole  (PROTONIX ) 40 MG tablet Take 40 mg by mouth 2 (two) times daily.     Vitamin D , Ergocalciferol , (DRISDOL ) 50000 units CAPS capsule Take 1 capsule (50,000 Units total) by mouth every 7 (seven) days. 12 capsule 4   warfarin (COUMADIN ) 1 MG tablet TAKE 1 AND 1/2 TO 2 TABLETS BY  MOUTH DAILY OR AS PRESCRIBED BY  COUMADIN  CLINIC 180 tablet 3   No facility-administered medications prior to visit.    PAST MEDICAL HISTORY: Past Medical History:  Diagnosis Date   Anxiety    Atrial fibrillation (HCC)    CAD (coronary artery disease)    s/p CABG   Carotid stenosis    mild by doppler   Complication of anesthesia    zenkers  diverticulum   Deep vein thrombophlebitis of leg (HCC)    DJD (degenerative joint disease)    Elevated TSH    secondary to Amio-normal free T4.    Essential hypertension,  benign    GERD (gastroesophageal reflux disease)    Hemorrhoids    Hiatal hernia    Hyperlipidemia    PAF (paroxysmal atrial fibrillation) (HCC)    Amiodarone    PE (pulmonary embolism)    pre 2007. Chronic anticoagulation   Prostate cancer (HCC)    Sebaceous cyst    neck   Seborrheic dermatitis    Tubular adenoma of colon 05/2011    PAST SURGICAL HISTORY: Past Surgical History:  Procedure Laterality Date   bilateral shoulder surgery     CORONARY ARTERY BYPASS GRAFT     x2    2009   ESOPHAGOGASTRODUODENOSCOPY (EGD) WITH PROPOFOL  N/A 01/24/2023   Procedure: ESOPHAGOGASTRODUODENOSCOPY (EGD) WITH PROPOFOL ;  Surgeon: Shila Gustav GAILS, MD;  Location: [facility];  Service: Gastroenterology;  Laterality: N/A;   ESOPHAGOGASTRODUODENOSCOPY (EGD) WITH  PROPOFOL  N/A 01/26/2023   Procedure: ESOPHAGOGASTRODUODENOSCOPY (EGD) WITH PROPOFOL ;  Surgeon: Stacia Glendia BRAVO, MD;  Location: [facility] ENDOSCOPY;  Service: Gastroenterology;  Laterality: N/A;   I & D EXTREMITY  07/09/2011   Procedure: IRRIGATION AND DEBRIDEMENT EXTREMITY;  Surgeon: Franky JONELLE Curia;  Location: [facility];  Service: Orthopedics;  Laterality: Left;   left knee arthroscopy     MITRAL VALVE REPAIR     POLYPECTOMY  01/26/2023   Procedure: POLYPECTOMY;  Surgeon: Stacia Glendia BRAVO, MD;  Location: [facility] ENDOSCOPY;  Service: Gastroenterology;;   prostate cancer surgery  11/1996   radial retropubic prostatectomy   SCLEROTHERAPY  01/26/2023   Procedure: SCLEROTHERAPY;  Surgeon: Stacia Glendia BRAVO, MD;  Location: [facility] ENDOSCOPY;  Service: Gastroenterology;;  Epi prior to polyp removal   TANDEM AND OVOID INSERTION     VASECTOMY     ZENKER'S DIVERTICULECTOMY N/A 11/07/2012   Procedure: ZENKER'S DIVERTICULECTOMY ENDOSCOPIC;  Surgeon: Vaughan Ricker, MD;  Location: [facility] OR;  Service: ENT;  Laterality: N/A;    FAMILY HISTORY: Family History  Problem Relation Age of Onset   Stroke Mother    Hypertension Mother    Heart attack Father    Esophageal cancer Brother    Hypertension Brother    Heart disease Brother    Prostate cancer Brother    Colon cancer Neg Hx    Pancreatic cancer Neg Hx    Stomach cancer Neg Hx     SOCIAL HISTORY: Social History   Socioeconomic History   Marital status: Married    Spouse name: margaret   Number of children: 3   Years of education: Not on file   Highest education level: Not on file  Occupational History   Occupation: retired    Associate Professor: RETIRED  Tobacco Use   Smoking status: Former    Current packs/day: 0.00    Average packs/day: 1 pack/day for 12.0 years (12.0 ttl pk-yrs)    Types: Cigarettes    Start date: 07/06/1956    Quit date: 07/06/1968    Years since quitting: 55.6   Smokeless tobacco: Never  Vaping Use   Vaping status: Never  Used  Substance and Sexual Activity   Alcohol use: Not Currently   Drug use: No   Sexual activity: Not Currently    Partners: Female    Comment: WIDOWED  Other Topics Concern   Not on file  Social History Narrative   Not on file   Social Drivers of Health   Financial Resource Strain: Not on file  Food Insecurity: No Food Insecurity (01/23/2023)   Hunger Vital Sign    Worried About Running Out of Food in the Last  Year: Never true    Ran Out of Food in the Last Year: Never true  Transportation Needs: No Transportation Needs (02/12/2023)   PRAPARE - Administrator, Civil Service (Medical): No    Lack of Transportation (Non-Medical): No  Physical Activity: Not on file  Stress: Not on file  Social Connections: Not on file  Intimate Partner Violence: Not At Risk (01/23/2023)   Humiliation, Afraid, Rape, and Kick questionnaire    Fear of Current or Ex-Partner: No    Emotionally Abused: No    Physically Abused: No    Sexually Abused: No     PHYSICAL EXAM  GENERAL EXAM/CONSTITUTIONAL: Vitals:  Vitals:   02/17/24 1015  BP: (!) 158/64  Pulse: 60  Weight: 170 lb (77.1 kg)  Height: 5' 8 (1.727 m)   Body mass index is 25.85 kg/m. Wt Readings from Last 3 Encounters:  02/17/24 170 lb (77.1 kg)  01/12/24 164 lb 6 oz (74.6 kg)  09/08/23 171 lb (77.6 kg)   Patient is in no distress; well developed, nourished and groomed; neck is supple  CARDIOVASCULAR: Examination of carotid arteries is normal; no carotid bruits Regular rate and rhythm, no murmurs Examination of peripheral vascular system by observation and palpation is normal  EYES: Ophthalmoscopic exam of optic discs and posterior segments is normal; no papilledema or hemorrhages No results found.  MUSCULOSKELETAL: Gait, strength, tone, movements noted in Neurologic exam below  NEUROLOGIC: MENTAL STATUS:      No data to display         awake, alert, oriented to person, place and time recent and  remote memory intact normal attention and concentration language fluent, comprehension intact, naming intact fund of knowledge appropriate  CRANIAL NERVE:  2nd - no papilledema on fundoscopic exam 2nd, 3rd, 4th, 6th - pupils equal and reactive to light, visual fields full to confrontation, extraocular muscles intact, no nystagmus 5th - facial sensation symmetric 7th - facial strength symmetric 8th - hearing intact 9th - palate elevates symmetrically, uvula midline 11th - shoulder shrug symmetric 12th - tongue protrusion midline MILD HOARSE VOICE  MOTOR:  normal bulk and tone, full strength in the BUE, BLE MILD POSTURAL AND ACTION TREMOR IN BUE  SENSORY:  normal and symmetric to light touch, temperature, vibration  COORDINATION:  finger-nose-finger, fine finger movements normal  REFLEXES:  deep tendon reflexes TRACE and symmetric  GAIT/STATION:  narrow based gait; SLIGHTLY CAUTIOUS GAIT; MILD STOOPED POSTURE; DECR RIGHT ARM SWING     DIAGNOSTIC DATA (LABS, IMAGING, TESTING) - I reviewed patient records, labs, notes, testing and imaging myself where available.  Lab Results  Component Value Date   WBC 6.0 08/27/2023   HGB 14.4 08/27/2023   HCT 44.2 08/27/2023   MCV 86 08/27/2023   PLT 275 08/27/2023      Component Value Date/Time   NA 141 08/27/2023 0826   K 5.4 (H) 08/27/2023 0826   CL 103 08/27/2023 0826   CO2 22 08/27/2023 0826   GLUCOSE 97 08/27/2023 0826   GLUCOSE 99 01/28/2023 0648   BUN 30 (H) 08/27/2023 0826   CREATININE 1.60 (H) 08/27/2023 0826   CALCIUM 9.8 08/27/2023 0826   PROT 7.2 08/27/2023 0826   ALBUMIN 4.6 08/27/2023 0826   AST 33 08/27/2023 0826   ALT 28 08/27/2023 0826   ALKPHOS 142 (H) 08/27/2023 0826   BILITOT 0.5 08/27/2023 0826   GFRNONAA 48 (L) 01/28/2023 0648   GFRAA 52 (L) 01/09/2020 1025   Lab Results  Component Value Date   CHOL 180 08/27/2023   HDL 80 08/27/2023   LDLCALC 79 08/27/2023   LDLDIRECT 155.5 03/11/2011    TRIG 121 08/27/2023   CHOLHDL 2.3 08/27/2023   Lab Results  Component Value Date   HGBA1C  07/22/2007    5.7 (NOTE)   The ADA recommends the following therapeutic goals for glycemic   control related to Hgb A1C measurement:   Goal of Therapy:   < 7.0% Hgb A1C   Action Suggested:  > 8.0% Hgb A1C   Ref:  Diabetes Care, 22, Suppl. 1, 1999   No results found for: VITAMINB12 Lab Results  Component Value Date   TSH 8.710 (H) 08/27/2023       ASSESSMENT AND PLAN  88 y.o. year old male here with:  Dx:  1. Essential tremor     PLAN:  Essential tremor (since early ~2025) - consider increasing bisoprolol dosing - consider primidone, but this interacts with warfarin (making warfarin less effective) - consider gabapentin or topiramate  Return for pending if symptoms worsen or fail to improve.    John FABIENE HANLON, MD 02/17/2024, 11:02 AM Certified in Neurology, Neurophysiology and Neuroimaging  [facility] [address] [place], [place] [address] [phone]

## 2024-02-17 NOTE — Patient Instructions (Signed)
  Essential tremor (since early 2025) - consider increasing bisoprolol dosing - consider primidone, but this interacts with warfarin (making warfarin less effective) - consider gabapentin or topiramate

## 2024-02-23 ENCOUNTER — Ambulatory Visit: Admitting: Diagnostic Neuroimaging

## 2024-02-27 ENCOUNTER — Other Ambulatory Visit: Payer: Self-pay | Admitting: Cardiology

## 2024-03-09 ENCOUNTER — Ambulatory Visit: Attending: Cardiology | Admitting: *Deleted

## 2024-03-09 DIAGNOSIS — I480 Paroxysmal atrial fibrillation: Secondary | ICD-10-CM | POA: Diagnosis not present

## 2024-03-09 DIAGNOSIS — Z5181 Encounter for therapeutic drug level monitoring: Secondary | ICD-10-CM | POA: Insufficient documentation

## 2024-03-09 LAB — POCT INR: POC INR: 3

## 2024-03-09 NOTE — Patient Instructions (Signed)
 Description   Continue taking warfarin 1.5 tablets daily except on Sundays and Thursdays take 2 tablets.  Recheck INR in 7 weeks.  Coumadin Clinic [phone]

## 2024-03-09 NOTE — Progress Notes (Signed)
 INR 3.0; Please see anticoagulation encounter Lab Results  Component Value Date   INR 3.0 03/09/2024   INR 2.8 01/20/2024   INR 2.2 12/02/2023   PROTIME 18.2 12/11/2008    Description   Continue taking warfarin 1.5 tablets daily except on Sundays and Thursdays take 2 tablets.  Recheck INR in 7 weeks.  Coumadin  Clinic [phone]

## 2024-03-13 ENCOUNTER — Ambulatory Visit (INDEPENDENT_AMBULATORY_CARE_PROVIDER_SITE_OTHER)

## 2024-03-13 VITALS — BP 164/63 | HR 65 | Temp 97.9°F | Resp 20 | Ht 69.0 in | Wt 170.8 lb

## 2024-03-13 DIAGNOSIS — E7800 Pure hypercholesterolemia, unspecified: Secondary | ICD-10-CM | POA: Diagnosis not present

## 2024-03-13 DIAGNOSIS — I739 Peripheral vascular disease, unspecified: Secondary | ICD-10-CM

## 2024-03-13 MED ORDER — INCLISIRAN SODIUM 284 MG/1.5ML ~~LOC~~ SOSY
284.0000 mg | PREFILLED_SYRINGE | Freq: Once | SUBCUTANEOUS | Status: AC
  Administered 2024-03-13: 284 mg via SUBCUTANEOUS
  Filled 2024-03-13: qty 1.5

## 2024-03-13 NOTE — Progress Notes (Signed)
 Diagnosis: Hyperlipidemia  Provider:  Mannam, Praveen MD  Procedure: Injection  Leqvio  (inclisiran), Dose: 284 mg, Site: subcutaneous, Number of injections: 1  Injection Site(s): Right arm  Post Care: Peripheral IV Discontinued  Discharge: Condition: Good, Destination: Home . AVS Provided  Performed by:  Leita FORBES Miles, LPN

## 2024-03-14 DIAGNOSIS — Z961 Presence of intraocular lens: Secondary | ICD-10-CM | POA: Diagnosis not present

## 2024-03-14 DIAGNOSIS — H5010 Unspecified exotropia: Secondary | ICD-10-CM | POA: Diagnosis not present

## 2024-03-15 DIAGNOSIS — N1832 Chronic kidney disease, stage 3b: Secondary | ICD-10-CM | POA: Diagnosis not present

## 2024-03-22 DIAGNOSIS — N1832 Chronic kidney disease, stage 3b: Secondary | ICD-10-CM | POA: Diagnosis not present

## 2024-03-22 DIAGNOSIS — E872 Acidosis, unspecified: Secondary | ICD-10-CM | POA: Diagnosis not present

## 2024-03-22 DIAGNOSIS — D631 Anemia in chronic kidney disease: Secondary | ICD-10-CM | POA: Diagnosis not present

## 2024-03-22 DIAGNOSIS — E875 Hyperkalemia: Secondary | ICD-10-CM | POA: Diagnosis not present

## 2024-03-22 DIAGNOSIS — I129 Hypertensive chronic kidney disease with stage 1 through stage 4 chronic kidney disease, or unspecified chronic kidney disease: Secondary | ICD-10-CM | POA: Diagnosis not present

## 2024-03-22 DIAGNOSIS — I480 Paroxysmal atrial fibrillation: Secondary | ICD-10-CM | POA: Diagnosis not present

## 2024-03-29 ENCOUNTER — Ambulatory Visit (INDEPENDENT_AMBULATORY_CARE_PROVIDER_SITE_OTHER)
Admission: RE | Admit: 2024-03-29 | Discharge: 2024-03-29 | Disposition: A | Discharge status: Home / Self Care | Source: Ambulatory Visit | Attending: Physician Assistant | Admitting: Physician Assistant

## 2024-03-29 ENCOUNTER — Encounter: Payer: Self-pay | Admitting: Physician Assistant

## 2024-03-29 ENCOUNTER — Ambulatory Visit: Admitting: Physician Assistant

## 2024-03-29 VITALS — BP 124/62 | HR 65 | Ht 69.0 in | Wt 169.0 lb

## 2024-03-29 DIAGNOSIS — K5900 Constipation, unspecified: Secondary | ICD-10-CM | POA: Diagnosis not present

## 2024-03-29 DIAGNOSIS — R1084 Generalized abdominal pain: Secondary | ICD-10-CM

## 2024-03-29 DIAGNOSIS — R109 Unspecified abdominal pain: Secondary | ICD-10-CM | POA: Diagnosis not present

## 2024-03-29 DIAGNOSIS — R490 Dysphonia: Secondary | ICD-10-CM | POA: Diagnosis not present

## 2024-03-29 DIAGNOSIS — K219 Gastro-esophageal reflux disease without esophagitis: Secondary | ICD-10-CM

## 2024-03-29 DIAGNOSIS — R1013 Epigastric pain: Secondary | ICD-10-CM

## 2024-03-29 MED ORDER — FAMOTIDINE 40 MG PO TABS: 40.0000 mg | ORAL_TABLET | Freq: Two times a day (BID) | ORAL | 5 refills | Status: AC

## 2024-03-29 NOTE — Progress Notes (Signed)
 Chief Complaint: Abdominal pain  HPI:    John [name] is an 88 year old male with a past medical history as listed below including A-fib and CAD status post CABG on Coumadin  as well as carotid stenosis (03/10/2023 echo with LVEF 60-65%) and multiple others, assigned to Dr. Shila at last visit, who returns to clinic today with abdominal pain.     09/17/2016 patient seen in clinic by Greig Corti, PA to discuss recall colonoscopy and complaints of dysphagia.  He has CAD status post CABG, previous mitral valve repair and atrial fibrillation history of DVT with history of Zenker's diverticulum for which he underwent diverticulectomy and history of prostate cancer.  At that time scheduled for EGD and colonoscopy.    11/17/2016 colonoscopy with four 6-7 mm polyps in the descending colon transverse colon as well as internal hemorrhoids.  Pathology showed adenomatous polyps.  No repeat recommended due to age.    01/24/23 EGD for GI bleed with Z-line regular and normal esophagus.  Single gastric cardia polyp that hyperplastic/inflammatory.  INR was 2.1 so this was not removed.  EGD repeated the next day for removal.    01/26/2023 CTAP without contrast with minimal left pleural effusion and diverticulosis.    08/27/2023 CMP with a creatinine of 1.60 (around baseline), BUN 30, potassium 5.4, alk phos 142.  CBC normal.    01/12/2024 patient seen in clinic and at that time discussed troubles constipation over the past 6 months.  MiraLAX  it stopped working for him.  He had been started on Linzess  72 mcg a month ago but still took 2 to 3 days to have a bowel movement.  Generalized abdominal discomfort some better after a bowel movement.  At that time increase Linzess  to 145.  Discussed that this is not helping would recommend a repeat CT.    Today, patient returns to clinic and tells me that even though he is taking Linzess  145 mcg daily he still only has a bowel movement every couple of days.  When he is able to have a  bowel movement, it does help with the constant aching in his abdomen, but it seems to come back after a few hours.  This ache is actually waking him up at night.  The only thing that relieves it is a bowel movement or sometimes it is better when he stands up.  Typically he does okay eating but then after an hour or 2 it starts hurting again.  He is on Pantoprazole  40 mg twice a day and he has been on this for years.  Denies any overt heartburn or reflux symptoms but does now tell me that his aching is sometimes more apparent in his epigastrium.  He has also noticed that he is more hoarse than normal and has actually had to cancel some singing Solo's due to his hoarseness which he thinks has gotten worse since seeing me last.  This discomfort is now waking him up at night and sometimes gets better if he is able to get up and walk around.      Denies seeing any blood in his stool.  No weight loss.  Past Medical History:  Diagnosis Date   Anxiety    Atrial fibrillation (HCC)    CAD (coronary artery disease)    s/p CABG   Carotid stenosis    mild by doppler   Complication of anesthesia    zenkers  diverticulum   Deep vein thrombophlebitis of leg (HCC)    DJD (degenerative joint  disease)    Elevated TSH    secondary to Amio-normal free T4.    Essential hypertension, benign    GERD (gastroesophageal reflux disease)    Hemorrhoids    Hiatal hernia    Hyperlipidemia    PAF (paroxysmal atrial fibrillation) (HCC)    Amiodarone    PE (pulmonary embolism)    pre 2007. Chronic anticoagulation   Prostate cancer (HCC)    Sebaceous cyst    neck   Seborrheic dermatitis    Tubular adenoma of colon 05/2011    Past Surgical History:  Procedure Laterality Date   bilateral shoulder surgery     CORONARY ARTERY BYPASS GRAFT     x2    2009   ESOPHAGOGASTRODUODENOSCOPY (EGD) WITH PROPOFOL  N/A 01/24/2023   Procedure: ESOPHAGOGASTRODUODENOSCOPY (EGD) WITH PROPOFOL ;  Surgeon: Shila Gustav GAILS, MD;   Location: [facility] ENDOSCOPY;  Service: Gastroenterology;  Laterality: N/A;   ESOPHAGOGASTRODUODENOSCOPY (EGD) WITH PROPOFOL  N/A 01/26/2023   Procedure: ESOPHAGOGASTRODUODENOSCOPY (EGD) WITH PROPOFOL ;  Surgeon: Stacia Glendia BRAVO, MD;  Location: [facility] ENDOSCOPY;  Service: Gastroenterology;  Laterality: N/A;   I & D EXTREMITY  07/09/2011   Procedure: IRRIGATION AND DEBRIDEMENT EXTREMITY;  Surgeon: Franky JONELLE Curia;  Location: [facility];  Service: Orthopedics;  Laterality: Left;   left knee arthroscopy     MITRAL VALVE REPAIR     POLYPECTOMY  01/26/2023   Procedure: POLYPECTOMY;  Surgeon: Stacia Glendia BRAVO, MD;  Location: [facility] ENDOSCOPY;  Service: Gastroenterology;;   prostate cancer surgery  11/1996   radial retropubic prostatectomy   SCLEROTHERAPY  01/26/2023   Procedure: SCLEROTHERAPY;  Surgeon: Stacia Glendia BRAVO, MD;  Location: [facility] ENDOSCOPY;  Service: Gastroenterology;;  Epi prior to polyp removal   TANDEM AND OVOID INSERTION     VASECTOMY     ZENKER'S DIVERTICULECTOMY N/A 11/07/2012   Procedure: ZENKER'S DIVERTICULECTOMY ENDOSCOPIC;  Surgeon: Vaughan Ricker, MD;  Location: [facility] OR;  Service: ENT;  Laterality: N/A;    Current Outpatient Medications  Medication Sig Dispense Refill   amiodarone  (PACERONE ) 200 MG tablet TAKE 1 TABLET BY MOUTH DAILY 90 tablet 3   amLODipine  (NORVASC ) 10 MG tablet Take 1 tablet (10 mg total) by mouth daily. 90 tablet 3   Bisoprolol Fumarate 2.5 MG TABS Take 2.5 mg by mouth daily.     Cholecalciferol  (VITAMIN D -3 PO) Take 1 tablet by mouth daily.     ezetimibe  (ZETIA ) 10 MG tablet TAKE 1 TABLET BY MOUTH DAILY 90 tablet 1   furosemide  (LASIX ) 20 MG tablet Take 20 mg by mouth daily as needed for fluid.     indapamide  (LOZOL ) 1.25 MG tablet Take 0.625 mg by mouth daily.     levothyroxine  (SYNTHROID ) 25 MCG tablet TAKE 1 TABLET BY MOUTH DAILY  BEFORE BREAKFAST 90 tablet 3   linaclotide  (LINZESS ) 145 MCG CAPS capsule Take 1 capsule (145 mcg total) by mouth daily. 30  capsule 11   losartan  (COZAAR ) 100 MG tablet TAKE 1 TABLET BY MOUTH DAILY 90 tablet 3   pantoprazole  (PROTONIX ) 40 MG tablet Take 40 mg by mouth 2 (two) times daily.     Vitamin D , Ergocalciferol , (DRISDOL ) 50000 units CAPS capsule Take 1 capsule (50,000 Units total) by mouth every 7 (seven) days. 12 capsule 4   warfarin (COUMADIN ) 1 MG tablet TAKE 1 AND 1/2 TO 2 TABLETS BY  MOUTH DAILY OR AS PRESCRIBED BY  COUMADIN  CLINIC 180 tablet 3   No current facility-administered medications for this visit.    Allergies as of  03/29/2024 - Review Complete 03/29/2024  Allergen Reaction Noted   Iodine Hives 12/11/2008   Statins Other (See Comments)     Family History  Problem Relation Age of Onset   Stroke Mother    Hypertension Mother    Heart attack Father    Esophageal cancer Brother    Hypertension Brother    Heart disease Brother    Prostate cancer Brother    Colon cancer Neg Hx    Pancreatic cancer Neg Hx    Stomach cancer Neg Hx     Social History   Socioeconomic History   Marital status: Married    Spouse name: margaret   Number of children: 3   Years of education: Not on file   Highest education level: Not on file  Occupational History   Occupation: retired    Associate Professor: RETIRED  Tobacco Use   Smoking status: Former    Current packs/day: 0.00    Average packs/day: 1 pack/day for 12.0 years (12.0 ttl pk-yrs)    Types: Cigarettes    Start date: 07/06/1956    Quit date: 07/06/1968    Years since quitting: 55.7   Smokeless tobacco: Never  Vaping Use   Vaping status: Never Used  Substance and Sexual Activity   Alcohol use: Not Currently   Drug use: No   Sexual activity: Not Currently    Partners: Female    Comment: WIDOWED  Other Topics Concern   Not on file  Social History Narrative   Not on file   Social Drivers of Health   Financial Resource Strain: Not on file  Food Insecurity: No Food Insecurity (01/23/2023)   Hunger Vital Sign    Worried About Running Out of  Food in the Last Year: Never true    Ran Out of Food in the Last Year: Never true  Transportation Needs: No Transportation Needs (02/12/2023)   PRAPARE - Administrator, Civil Service (Medical): No    Lack of Transportation (Non-Medical): No  Physical Activity: Not on file  Stress: Not on file  Social Connections: Not on file  Intimate Partner Violence: Not At Risk (01/23/2023)   Humiliation, Afraid, Rape, and Kick questionnaire    Fear of Current or Ex-Partner: No    Emotionally Abused: No    Physically Abused: No    Sexually Abused: No   Review of Systems:    Constitutional: No weight loss, fever or chills Cardiovascular: No chest pain   Respiratory: No SOB  Gastrointestinal: See HPI and otherwise negative   Physical Exam:  Vital signs: BP 124/62   Pulse 65   Ht 5' 9 (1.753 m)   Wt 169 lb (76.7 kg)   BMI 24.96 kg/m    Constitutional:   Pleasant elderly Caucasian male appears to be in NAD, Well developed, Well nourished, alert and cooperative Respiratory: Respirations even and unlabored. Lungs clear to auscultation bilaterally.   No wheezes, crackles, or rhonchi.  Cardiovascular: Normal S1, S2. No MRG. Regular rate and rhythm. No peripheral edema, cyanosis or pallor.  Gastrointestinal:  Soft, nondistended, mild epigastric ttp, No rebound or guarding. Normal bowel sounds. No appreciable masses or hepatomegaly. Rectal:  Not performed.  Psychiatric: Demonstrates good judgement and reason without abnormal affect or behaviors.  RELEVANT LABS AND IMAGING: CBC    Component Value Date/Time   WBC 6.0 08/27/2023 0826   WBC 7.3 01/28/2023 0648   RBC 5.13 08/27/2023 0826   RBC 2.88 (L) 01/28/2023 0648   HGB 14.4  08/27/2023 0826   HCT 44.2 08/27/2023 0826   PLT 275 08/27/2023 0826   MCV 86 08/27/2023 0826   MCH 28.1 08/27/2023 0826   MCH 29.5 01/28/2023 0648   MCHC 32.6 08/27/2023 0826   MCHC 33.6 01/28/2023 0648   RDW 15.6 (H) 08/27/2023 0826   LYMPHSABS 0.9  01/28/2023 0648   LYMPHSABS 1.1 05/16/2018 0827   MONOABS 0.7 01/28/2023 0648   EOSABS 0.3 01/28/2023 0648   EOSABS 0.1 05/16/2018 0827   BASOSABS 0.0 01/28/2023 0648   BASOSABS 0.0 05/16/2018 0827    CMP     Component Value Date/Time   NA 141 08/27/2023 0826   K 5.4 (H) 08/27/2023 0826   CL 103 08/27/2023 0826   CO2 22 08/27/2023 0826   GLUCOSE 97 08/27/2023 0826   GLUCOSE 99 01/28/2023 0648   BUN 30 (H) 08/27/2023 0826   CREATININE 1.60 (H) 08/27/2023 0826   CALCIUM 9.8 08/27/2023 0826   PROT 7.2 08/27/2023 0826   ALBUMIN 4.6 08/27/2023 0826   AST 33 08/27/2023 0826   ALT 28 08/27/2023 0826   ALKPHOS 142 (H) 08/27/2023 0826   BILITOT 0.5 08/27/2023 0826   GFRNONAA 48 (L) 01/28/2023 0648   GFRAA 52 (L) 01/09/2020 1025    Assessment: 1.  Constipation: For the past 6 months more constipated, initially MiraLAX  unable to help, started on Linzess  72 which is increased to 145 at last visit still a bowel movement every 2 to 3 days, which does help some generalized abdominal discomfort; likely slow transit +/- age contributing 2.  Generalized abdominal pain: With above, better after a good bowel movement 3.  Epigastric pain: Does describe some epigastric discomfort now dull ache as well as hoarseness which is increased since last visit, does have chronic reflux on Pantoprazole  40 twice daily, last EGD in July 2024 for GI bleed with a inflammatory polyp and otherwise normal; consider ongoing reflux/gastritis contributing to symptoms 4.  History of GERD  Plan: 1.  Today patient tells me that some of this aching is more prominent in his epigastrium and seems relieved when he eats, has also noticed some hoarseness which is new from his last visit and more concerning for an upper GI etiology. 2.  At this point we will add Famotidine  40 mg every morning and nightly #60 with 5 refills. 3.  Continue Pantoprazole  40 mg twice daily 4.  Will also continue to work on his constipation as he  still only has a bowel movement every 2 to 3 days and this also helps his abdominal discomfort.  Continue Linzess  145 mcg today. 5.  Ordered a two-view abdominal x-ray to see state of stool.  If he is full of stool would recommend a bowel purge and increase to Linzess  290 mcg daily.  If he is not  full of stool would recommend a CT of the abdomen/ pelvis with contrast.  Would need a BMP prior to this. 6.  Discussed that if all of the imaging above was normal then can consider a colonoscopy given his change in bowel habits. 7.  Patient to follow in clinic per recommendation after imaging above.  Delon Failing, PA-C [facility] 03/29/2024, 10:23 AM  Cc: Clarice Nottingham, MD

## 2024-03-29 NOTE — Patient Instructions (Addendum)
 Your provider has requested that you have an abdominal x ray before leaving today. Please go to the basement floor to our Radiology department for the test.  We have sent the following medications to your pharmacy for you to pick up at your convenience: Famotidine  40 mg twice daily.   _______________________________________________________  If your blood pressure at your visit was 140/90 or greater, please contact your primary care physician to follow up on this.  _______________________________________________________  If you are age 85 or older, your body mass index should be between 23-30. Your Body mass index is 24.96 kg/m. If this is out of the aforementioned range listed, please consider follow up with your Primary Care Provider.  If you are age 60 or younger, your body mass index should be between 19-25. Your Body mass index is 24.96 kg/m. If this is out of the aformentioned range listed, please consider follow up with your Primary Care Provider.   ________________________________________________________  The [facility] providers would like to encourage you to use MYCHART to communicate with providers for non-urgent requests or questions.  Due to long hold times on the telephone, sending your provider a message by [facility] may be a faster and more efficient way to get a response.  Please allow 48 business hours for a response.  Please remember that this is for non-urgent requests.  _______________________________________________________  [facility] is using a team-based approach to care.  Your team is made up of your doctor and two to three APPS. Our APPS (Nurse Practitioners and Physician Assistants) work with your physician to ensure care continuity for you. They are fully qualified to address your health concerns and develop a treatment plan. They communicate directly with your gastroenterologist to care for you. Seeing the Advanced Practice Practitioners on your  physician's team can help you by facilitating care more promptly, often allowing for earlier appointments, access to diagnostic testing, procedures, and other specialty referrals.

## 2024-04-03 ENCOUNTER — Ambulatory Visit: Payer: Self-pay | Admitting: Physician Assistant

## 2024-04-03 DIAGNOSIS — R1013 Epigastric pain: Secondary | ICD-10-CM

## 2024-04-03 DIAGNOSIS — R1084 Generalized abdominal pain: Secondary | ICD-10-CM

## 2024-04-03 NOTE — Progress Notes (Signed)
 Please let patient know that I reviewed imaging. Interestingly he is not that full of stool.  I think we should proceed with a CT of the abdomen and pelvis with contrast.  He will need a BMP prior to this.  This will allow us  to make sure there is nothing else going on in his abdomen giving him this discomfort.  If he is interested we can also increase to Linzess  290 mcg daily to see if this helps with the frequency of his stool and the feeling that he still needs to go.  If so please prescribe #30 with 3 refills.  Thanks, JL L

## 2024-04-03 NOTE — Telephone Encounter (Signed)
 Patient returning call, Please review and advise

## 2024-04-05 ENCOUNTER — Other Ambulatory Visit (INDEPENDENT_AMBULATORY_CARE_PROVIDER_SITE_OTHER)

## 2024-04-05 DIAGNOSIS — R1084 Generalized abdominal pain: Secondary | ICD-10-CM

## 2024-04-05 DIAGNOSIS — R1013 Epigastric pain: Secondary | ICD-10-CM | POA: Diagnosis not present

## 2024-04-05 LAB — BASIC METABOLIC PANEL WITH GFR
BUN: 27 mg/dL — ABNORMAL HIGH (ref 6–23)
CO2: 23 meq/L (ref 19–32)
Calcium: 8.8 mg/dL (ref 8.4–10.5)
Chloride: 110 meq/L (ref 96–112)
Creatinine, Ser: 1.8 mg/dL — ABNORMAL HIGH (ref 0.40–1.50)
GFR: 33.79 mL/min — ABNORMAL LOW (ref >60.00)
Glucose, Bld: 109 mg/dL — ABNORMAL HIGH (ref 70–99)
Potassium: 5 meq/L (ref 3.5–5.1)
Sodium: 141 meq/L (ref 135–145)

## 2024-04-09 ENCOUNTER — Other Ambulatory Visit: Payer: Self-pay | Admitting: Cardiology

## 2024-04-09 DIAGNOSIS — I4891 Unspecified atrial fibrillation: Secondary | ICD-10-CM

## 2024-04-12 ENCOUNTER — Ambulatory Visit (HOSPITAL_COMMUNITY)
Admission: RE | Admit: 2024-04-12 | Discharge: 2024-04-12 | Disposition: A | Discharge status: Home / Self Care | Source: Ambulatory Visit | Attending: Physician Assistant | Admitting: Physician Assistant

## 2024-04-12 ENCOUNTER — Encounter (HOSPITAL_COMMUNITY): Payer: Self-pay

## 2024-04-12 ENCOUNTER — Telehealth: Payer: Self-pay

## 2024-04-12 DIAGNOSIS — R1084 Generalized abdominal pain: Secondary | ICD-10-CM

## 2024-04-12 DIAGNOSIS — R1013 Epigastric pain: Secondary | ICD-10-CM

## 2024-04-12 NOTE — Telephone Encounter (Signed)
 Received a call from the CT department at [facility]. She reports that pt is there and scheduled for a CT with oral and IV contrast. Pt has an iodine allergy and his GFR is only 33. She reports that we can do reschedule pt with oral and IV contrast but he will need to be pre-medicated first. She does report that if the GFR drops below 30 they are not able to give IV contrast either way. Procedure cancelled for today. Spoke with provider. Orders given to reorder CT with oral contrast only. Order placed and message sent to schedulers.

## 2024-04-13 ENCOUNTER — Other Ambulatory Visit: Payer: Self-pay

## 2024-04-13 NOTE — Telephone Encounter (Signed)
 Attempted to reach pt to let him know, the orders for the CT have been placed, and a message has been sent to the schedulers. No answer, left vm for pt with number to call radiology scheduling [phone]

## 2024-04-13 NOTE — Addendum Note (Signed)
 Addended by: NICHOLAUS JARVIS on: 04/13/2024 10:31 AM   Modules accepted: Orders

## 2024-04-13 NOTE — Telephone Encounter (Signed)
 Inbound call from patient stating he would like to be rescheduled soon for CT, states he's in pain. Please advise  Thank you

## 2024-04-13 NOTE — Telephone Encounter (Signed)
 Received a message from the schedulers that CT should be order w/o contrast due to pt having an allergy to IV contrast. Original order cancelled and new order placed.

## 2024-04-14 ENCOUNTER — Ambulatory Visit (HOSPITAL_COMMUNITY)
Admission: RE | Admit: 2024-04-14 | Discharge: 2024-04-14 | Disposition: A | Discharge status: Home / Self Care | Source: Ambulatory Visit | Attending: Physician Assistant | Admitting: Physician Assistant

## 2024-04-14 DIAGNOSIS — R1084 Generalized abdominal pain: Secondary | ICD-10-CM | POA: Diagnosis not present

## 2024-04-14 DIAGNOSIS — K4090 Unilateral inguinal hernia, without obstruction or gangrene, not specified as recurrent: Secondary | ICD-10-CM | POA: Diagnosis not present

## 2024-04-14 DIAGNOSIS — K5730 Diverticulosis of large intestine without perforation or abscess without bleeding: Secondary | ICD-10-CM | POA: Diagnosis not present

## 2024-04-14 MED ORDER — BARIUM SULFATE 2 % PO SUSP
900.0000 mL | Freq: Once | ORAL | Status: AC
  Administered 2024-04-14: 900 mL via ORAL

## 2024-04-18 ENCOUNTER — Ambulatory Visit: Payer: Self-pay | Admitting: Gastroenterology

## 2024-04-18 ENCOUNTER — Telehealth: Payer: Self-pay | Admitting: Physician Assistant

## 2024-04-18 NOTE — Telephone Encounter (Signed)
 Left message on machine to call back

## 2024-04-18 NOTE — Telephone Encounter (Signed)
 Inbound call from patient calling to see if his CT scan results had been reviewed. I advised him that Delon was out. He states he does not want to keep waiting to know the results and request someone else review the results. Please advise.

## 2024-04-18 NOTE — Telephone Encounter (Signed)
 Patient returning phone call. Please advise, thank you

## 2024-04-20 DIAGNOSIS — R0609 Other forms of dyspnea: Secondary | ICD-10-CM | POA: Diagnosis not present

## 2024-04-20 DIAGNOSIS — J90 Pleural effusion, not elsewhere classified: Secondary | ICD-10-CM | POA: Diagnosis not present

## 2024-04-20 DIAGNOSIS — Z23 Encounter for immunization: Secondary | ICD-10-CM | POA: Diagnosis not present

## 2024-04-20 DIAGNOSIS — R079 Chest pain, unspecified: Secondary | ICD-10-CM | POA: Diagnosis not present

## 2024-04-20 DIAGNOSIS — K5909 Other constipation: Secondary | ICD-10-CM | POA: Diagnosis not present

## 2024-04-20 DIAGNOSIS — N1832 Chronic kidney disease, stage 3b: Secondary | ICD-10-CM | POA: Diagnosis not present

## 2024-04-20 DIAGNOSIS — E039 Hypothyroidism, unspecified: Secondary | ICD-10-CM | POA: Diagnosis not present

## 2024-04-20 DIAGNOSIS — G250 Essential tremor: Secondary | ICD-10-CM | POA: Diagnosis not present

## 2024-04-20 DIAGNOSIS — R490 Dysphonia: Secondary | ICD-10-CM | POA: Diagnosis not present

## 2024-04-20 DIAGNOSIS — I10 Essential (primary) hypertension: Secondary | ICD-10-CM | POA: Diagnosis not present

## 2024-04-21 ENCOUNTER — Telehealth: Payer: Self-pay | Admitting: Cardiology

## 2024-04-21 NOTE — Telephone Encounter (Signed)
 Patient identification verified by 2 forms.      Spoke with pt regarding the results of recent abdomen/pelvic CTA. Pt is particularly concerned with bilateral effusions. Pt is on coumadin  and states that INR is stable and therapeutic. Pt would like to be seen sooner to be evaluated. Pt is not complaining of chest pain or shortness of breath. Pt did have some pain earlier this week but he believes it could be stomach pain since he has been having a bit of GI upset recently. Pt will be seen next week on 10/21. Pt advised of ED precautions and he verbalizes understanding.

## 2024-04-21 NOTE — Telephone Encounter (Signed)
 Pt requesting a c/b to discuss his CT results and would like a sooner appt.

## 2024-04-24 NOTE — Progress Notes (Unsigned)
 Cardiology Office Note   Date:  04/25/2024  ID:  Severus, Brodzinski [birth year], MRN [id number] PCP: Clarice Nottingham, MD  [facility] Providers Cardiologist:  Redell Shallow, MD   History of Present Illness John [name] is a 88 y.o. male with a past medical history of coronary bypass grafting as well as mitral valve repair January 2009, history of atrial fibrillation on amiodarone , hyperlipidemia, pulmonary embolism, anxiety here for follow-up appointment.  Nuclear study December 2022 showed ejection fraction of 63% with no ischemia or infarction.  ABIs December 2022 were normal on the left and noncompressible on the right.  Carotid Dopplers 02/2022 showed 1 to 39% bilateral stenosis.  Echocardiogram September 2024 showed normal LV function, mild left ventricular hypertrophy, severe left atrial enlargement, status post mitral valve repair with mild to moderate mitral stenosis (mean gradient 7 mmHg), trace mitral regurgitation, mild aortic insufficiency.  Was admitted for weakness and syncope July 2024 and found to have GI bleed.  Had a gastric polyp resected.  Since then have been doing well.  Was seen by Dr. Shallow February of this year and denied any dyspnea, chest pain, palpitations, syncope.  He did have some higher blood pressures that he reported.  Ultimately, some lab work was ordered and he was encouraged to continue SBE prophylaxis.  He was not on aspirin  given the need for anticoagulation.  Encouraged to continue Zetia  and send to check a lipid panel.  Is intolerant to statins.  His amlodipine  was increased to 10 mg daily to help improve his blood pressure control.  Today, he presents with a hx of coronary artery disease and atrial fibrillation with shortness of breath and abdominal pain.  He experiences shortness of breath, associated with bilateral pleural effusions noted on a recent CT scan. Lasix  20 mg daily has provided some relief. He has difficulty breathing deeply and has  lost his voice, impacting his ability to sing.  He has a history of atrial fibrillation, managed with amiodarone  200 mg daily, with no recent episodes. Blood pressure readings have been variable, ranging from 120/60 to 150/unknown. He has been on losartan  for a long time.  He is on Leqvio  injections for cholesterol management, with an LDL of 89 in the summer. He has a history of coronary artery disease and carotid disease, with a recent ultrasound showing mild disease.  He recalls significant fluid retention post-open-heart surgery, which required drainage. He is currently monitoring his weight at home, stable around 164 pounds.  Reports no chest pain, pressure, or tightness. No orthopnea, PND. Reports no palpitations.   Discussed the use of AI scribe software for clinical note transcription with the patient, who gave verbal consent to proceed.  ROS: Pertinent ROS in HPI  Studies Reviewed      IMPRESSIONS     1. Left ventricular ejection fraction, by estimation, is 60 to 65%. The  left ventricle has normal function. The left ventricle has no regional  wall motion abnormalities. There is mild left ventricular hypertrophy.  Left ventricular diastolic function  could not be evaluated.   2. Right ventricular systolic function is normal. The right ventricular  size is normal. There is normal pulmonary artery systolic pressure. The  estimated right ventricular systolic pressure is 31.1 mmHg.   3. Left atrial size was severely dilated.   4. The mitral valve has been repaired/replaced. Trivial mitral valve  regurgitation. Mild to moderate mitral stenosis. The mean mitral valve  gradient is 7.0 mmHg with average heart rate  of 62 bpm. There is a 26 mm  prosthetic annuloplasty ring present in  the mitral position.   5. The aortic valve is tricuspid. Aortic valve regurgitation is mild.  Aortic valve sclerosis/calcification is present, without any evidence of  aortic stenosis.   6. The  inferior vena cava is normal in size with greater than 50%  respiratory variability, suggesting right atrial pressure of 3 mmHg.   Comparison(s): No significant change from prior study. 05/08/2022: LVEF  55-60%, severe LAE, 26 mm annuloplasty ring, mean gradient 8 mmHg, mild  AI.    Risk Assessment/Calculations  CHA2DS2-VASc Score = 3  This indicates a 3.2% annual risk of stroke. The patient's score is based upon: CHF History: 0 HTN History: 1 Diabetes History: 0 Stroke History: 0 Vascular Disease History: 0 Age Score: 2 Gender Score: 0   Physical Exam VS:  BP (!) 152/66 (BP Location: Left Arm, Patient Position: Sitting)   Pulse (!) 54   Ht 5' 9 (1.753 m)   Wt 170 lb (77.1 kg)   SpO2 98%   BMI 25.10 kg/m        Wt Readings from Last 3 Encounters:  04/25/24 170 lb (77.1 kg)  03/29/24 169 lb (76.7 kg)  03/13/24 170 lb 12.8 oz (77.5 kg)    GEN: Well nourished, well developed in no acute distress NECK: No JVD; No carotid bruits CARDIAC: RRR, no murmurs, rubs, gallops RESPIRATORY:  Clear to auscultation without rales, wheezing or rhonchi  ABDOMEN: Soft, non-tender, non-distended EXTREMITIES:  No edema; No deformity   ASSESSMENT AND PLAN  Bilateral pleural effusions with shortness of breath Mild bilateral pleural effusions on CT contributing to shortness of breath. Managed with Lasix  20 mg daily. No fluid drainage needed due to small effusion size. Shortness of breath may improve with diuretics. - Continue Lasix  20 mg daily for another week, then reduce to as needed. - Monitor weight daily to assess fluid status. - Schedule echocardiogram prior to December 2nd appointment with Dr. Pietro.  Paroxysmal Atrial fibrillation, on amiodarone  Atrial fibrillation well-controlled on amiodarone  200 mg daily. Aware of potential side effects of amiodarone .  Essential hypertension Blood pressure variable, recent readings 136/68 to 150/unknown. Pain and other factors may  contribute. Long-term losartan  use may have led to tolerance. - Monitor blood pressure and reassess at December appointment with Dr. Pietro.  Hyperlipidemia, on Repatha and Laquivone Hyperlipidemia managed with Laquivone injections twice a year. Recent LDL 89, goal less than 70 due to coronary artery disease. - Recheck cholesterol levels at next PCP appointment.  Chronic kidney disease, stage unspecified Chronic kidney disease with creatinine levels 1.4 to 1.9. - Follow-up with nephrologist scheduled in 3-4 months.  Abdominal pain with diverticulosis Persistent abdominal pain with known diverticulosis. No diverticulitis or bowel obstruction on recent CT. Pain may be related to diverticulosis or other gastrointestinal issues. - Contact gastroenterologist for potential additional medication to manage abdominal pain, such as Carafate .  Gastroesophageal reflux disease (GERD) Persistent gnawing abdominal pain despite pantoprazole  40 mg twice daily and Pepcid  40 mg twice daily. - Contact gastroenterologist for potential additional medication to manage GERD symptoms, such as Carafate .     Dispo: He can follow-up as scheduled in December with Dr. Pietro  Signed, Orren LOISE Fabry, PA-C

## 2024-04-25 ENCOUNTER — Encounter: Payer: Self-pay | Admitting: Physician Assistant

## 2024-04-25 ENCOUNTER — Ambulatory Visit: Attending: Physician Assistant | Admitting: Physician Assistant

## 2024-04-25 VITALS — BP 152/66 | HR 54 | Ht 69.0 in | Wt 170.0 lb

## 2024-04-25 DIAGNOSIS — E785 Hyperlipidemia, unspecified: Secondary | ICD-10-CM | POA: Diagnosis not present

## 2024-04-25 DIAGNOSIS — I10 Essential (primary) hypertension: Secondary | ICD-10-CM | POA: Insufficient documentation

## 2024-04-25 DIAGNOSIS — I480 Paroxysmal atrial fibrillation: Secondary | ICD-10-CM | POA: Diagnosis not present

## 2024-04-25 DIAGNOSIS — I059 Rheumatic mitral valve disease, unspecified: Secondary | ICD-10-CM | POA: Insufficient documentation

## 2024-04-25 DIAGNOSIS — Z9889 Other specified postprocedural states: Secondary | ICD-10-CM | POA: Diagnosis not present

## 2024-04-25 DIAGNOSIS — Z951 Presence of aortocoronary bypass graft: Secondary | ICD-10-CM | POA: Insufficient documentation

## 2024-04-25 DIAGNOSIS — Z5181 Encounter for therapeutic drug level monitoring: Secondary | ICD-10-CM | POA: Diagnosis not present

## 2024-04-25 DIAGNOSIS — I4891 Unspecified atrial fibrillation: Secondary | ICD-10-CM | POA: Insufficient documentation

## 2024-04-25 NOTE — Patient Instructions (Signed)
 Medication Instructions:  CONTINUE LASIX  20MG  DAILY FOR ANOTHER WEEK *If you need a refill on your cardiac medications before your next appointment, please call your pharmacy*  Lab Work: NONE ORDERED If you have labs (blood work) drawn today and your tests are completely normal, you will receive your results only by: MyChart Message (if you have MyChart) OR A paper copy in the mail If you have any lab test that is abnormal or we need to change your treatment, we will call you to review the results.  Testing/Procedures: Your physician has requested that you have an echocardiogram. Echocardiography is a painless test that uses sound waves to create images of your heart. It provides your doctor with information about the size and shape of your heart and how well your heart's chambers and valves are working. This procedure takes approximately one hour. There are no restrictions for this procedure. Please do NOT wear cologne, perfume, aftershave, or lotions (deodorant is allowed). Please arrive 15 minutes prior to your appointment time.  Please note: We ask at that you not bring children with you during ultrasound (echo/ vascular) testing. Due to room size and safety concerns, children are not allowed in the ultrasound rooms during exams. Our front office staff cannot provide observation of children in our lobby area while testing is being conducted. An adult accompanying a patient to their appointment will only be allowed in the ultrasound room at the discretion of the ultrasound technician under special circumstances. We apologize for any inconvenience.  Follow-Up: At [facility], you and your health needs are our priority.  As part of our continuing mission to provide you with exceptional heart care, our providers are all part of one team.  This team includes your primary Cardiologist (physician) and Advanced Practice Providers or APPs (Physician Assistants and Nurse Practitioners) who all  work together to provide you with the care you need, when you need it.  Your next appointment:   FOLLOW UP AS SCHEDULED   Provider:   Redell Shallow, MD    We recommend signing up for the patient portal called MyChart.  Sign up information is provided on this After Visit Summary.  MyChart is used to connect with patients for Virtual Visits (Telemedicine).  Patients are able to view lab/test results, encounter notes, upcoming appointments, etc.  Non-urgent messages can be sent to your provider as well.   To learn more about what you can do with MyChart, go to [email or web address].   Other Instructions Please check your weight daily. Please contact the office if you gain more than 2lbs in a day or 5lbs in a week.  Limit your salt intake to 1500-2000mg  per day or 500mg  of Sodium per meal. CONTINUE TO MONITOR YOUR BLOOD PRESSURE

## 2024-04-27 ENCOUNTER — Ambulatory Visit: Attending: Cardiology | Admitting: *Deleted

## 2024-04-27 DIAGNOSIS — I480 Paroxysmal atrial fibrillation: Secondary | ICD-10-CM | POA: Insufficient documentation

## 2024-04-27 DIAGNOSIS — Z5181 Encounter for therapeutic drug level monitoring: Secondary | ICD-10-CM | POA: Insufficient documentation

## 2024-04-27 LAB — POCT INR: POC INR: 2.8

## 2024-04-27 NOTE — Progress Notes (Signed)
 Lab Results  Component Value Date   INR 2.8 04/27/2024   INR 3.0 03/09/2024   INR 2.8 01/20/2024   PROTIME 18.2 12/11/2008    Description   Continue taking warfarin 1.5 tablets daily except on Sundays and Thursdays take 2 tablets.  Recheck INR in 7 weeks.  Coumadin  Clinic [phone]

## 2024-04-27 NOTE — Patient Instructions (Signed)
 Description   Continue taking warfarin 1.5 tablets daily except on Sundays and Thursdays take 2 tablets.  Recheck INR in 7 weeks.  Coumadin Clinic [phone]

## 2024-05-03 DIAGNOSIS — M810 Age-related osteoporosis without current pathological fracture: Secondary | ICD-10-CM | POA: Diagnosis not present

## 2024-05-04 DIAGNOSIS — L720 Epidermal cyst: Secondary | ICD-10-CM | POA: Diagnosis not present

## 2024-05-04 DIAGNOSIS — D225 Melanocytic nevi of trunk: Secondary | ICD-10-CM | POA: Diagnosis not present

## 2024-05-04 DIAGNOSIS — C4442 Squamous cell carcinoma of skin of scalp and neck: Secondary | ICD-10-CM | POA: Diagnosis not present

## 2024-05-04 DIAGNOSIS — Z85828 Personal history of other malignant neoplasm of skin: Secondary | ICD-10-CM | POA: Diagnosis not present

## 2024-05-04 DIAGNOSIS — D485 Neoplasm of uncertain behavior of skin: Secondary | ICD-10-CM | POA: Diagnosis not present

## 2024-05-04 DIAGNOSIS — D692 Other nonthrombocytopenic purpura: Secondary | ICD-10-CM | POA: Diagnosis not present

## 2024-05-04 DIAGNOSIS — D1801 Hemangioma of skin and subcutaneous tissue: Secondary | ICD-10-CM | POA: Diagnosis not present

## 2024-05-04 DIAGNOSIS — L570 Actinic keratosis: Secondary | ICD-10-CM | POA: Diagnosis not present

## 2024-05-04 DIAGNOSIS — L821 Other seborrheic keratosis: Secondary | ICD-10-CM | POA: Diagnosis not present

## 2024-05-09 ENCOUNTER — Ambulatory Visit: Admitting: Physician Assistant

## 2024-05-09 ENCOUNTER — Encounter: Payer: Self-pay | Admitting: Physician Assistant

## 2024-05-09 VITALS — BP 134/56 | HR 68 | Ht 69.0 in | Wt 169.0 lb

## 2024-05-09 DIAGNOSIS — R109 Unspecified abdominal pain: Secondary | ICD-10-CM

## 2024-05-09 DIAGNOSIS — K219 Gastro-esophageal reflux disease without esophagitis: Secondary | ICD-10-CM

## 2024-05-09 DIAGNOSIS — K5909 Other constipation: Secondary | ICD-10-CM

## 2024-05-09 DIAGNOSIS — G8929 Other chronic pain: Secondary | ICD-10-CM

## 2024-05-09 DIAGNOSIS — R1013 Epigastric pain: Secondary | ICD-10-CM

## 2024-05-09 MED ORDER — LUBIPROSTONE 24 MCG PO CAPS: 24.0000 ug | ORAL_CAPSULE | Freq: Two times a day (BID) | ORAL | 3 refills | Status: DC

## 2024-05-09 NOTE — Patient Instructions (Addendum)
 _______________________________________________________  If your blood pressure at your visit was 140/90 or greater, please contact your primary care physician to follow up on this.  _______________________________________________________  If you are age 86 or older, your body mass index should be between 23-30. Your Body mass index is 24.96 kg/m. If this is out of the aforementioned range listed, please consider follow up with your Primary Care Provider.  If you are age 31 or younger, your body mass index should be between 19-25. Your Body mass index is 24.96 kg/m. If this is out of the aformentioned range listed, please consider follow up with your Primary Care Provider.   ________________________________________________________  The [facility] providers would like to encourage you to use MYCHART to communicate with providers for non-urgent requests or questions.  Due to long hold times on the telephone, sending your provider a message by [name] may be a faster and more efficient way to get a response.  Please allow 48 business hours for a response.  Please remember that this is for non-urgent requests.  _______________________________________________________  [facility] is using a team-based approach to care.  Your team is made up of your doctor and two to three APPS. Our APPS (Nurse Practitioners and Physician Assistants) work with your physician to ensure care continuity for you. They are fully qualified to address your health concerns and develop a treatment plan. They communicate directly with your gastroenterologist to care for you. Seeing the Advanced Practice Practitioners on your physician's team can help you by facilitating care more promptly, often allowing for earlier appointments, access to diagnostic testing, procedures, and other specialty referrals.   Stop Linzess . Start Amitizia  Please call us  in 2 months to schedule an appointment.

## 2024-05-09 NOTE — Progress Notes (Signed)
 Chief Complaint: Follow-up abdominal pain and constipation  HPI:    Mr. Minchey is an 88 year old male with a past medical history as listed below including A-fib on Coumadin , CAD status post CABG and carotid stenosis (9//24 echo with LVEF 60-65%), DVT, GERD and multiple others, known to Dr. Shila, who presents to clinic for follow-up of abdominal pain and constipation.      09/17/2016 patient seen in clinic by Greig Corti, PA to discuss recall colonoscopy and complaints of dysphagia.  He has CAD status post CABG, previous mitral valve repair and atrial fibrillation history of DVT with history of Zenker's diverticulum for which he underwent diverticulectomy and history of prostate cancer.  At that time scheduled for EGD and colonoscopy.    11/17/2016 colonoscopy with four 6-7 mm polyps in the descending colon transverse colon as well as internal hemorrhoids.  Pathology showed adenomatous polyps.  No repeat recommended due to age.    01/24/23 EGD for GI bleed with Z-line regular and normal esophagus.  Single gastric cardia polyp that hyperplastic/inflammatory.  INR was 2.1 so this was not removed.  EGD repeated the next day for removal.    01/26/2023 CTAP without contrast with minimal left pleural effusion and diverticulosis.    08/27/2023 CMP with a creatinine of 1.60 (around baseline), BUN 30, potassium 5.4, alk phos 142.  CBC normal.    01/12/2024 patient seen in clinic and at that time discussed troubles constipation over the past 6 months.  MiraLAX  it stopped working for him.  He had been started on Linzess  72 mcg a month ago but still took 2 to 3 days to have a bowel movement.  Generalized abdominal discomfort some better after a bowel movement.  At that time increase Linzess  to 145.  Discussed that this is not helping would recommend a repeat CT.    03/29/2024 patient seen in clinic and at that time even though taking Linzess  145 mcg still only had a bowel movement every couple of days also some  abdominal pain no better with Pantoprazole  40 twice daily.  Discussed hoarseness which is inhibiting his ability to sing Solos.  Added Famotidine  40 mg twice daily.  Continue Pantoprazole  40 twice daily.  Continue Linzess .  Ordered a two-view abdominal x-ray.  Discussed possible colonoscopy if imaging and bowel habits do not improve.    04/03/2024 abdominal x-ray did not show constipation.  Recommended CTAP with contrast.  BMP prior.  Recommended increasing Linzess  to 290 mcg.  CT changed without contrast due to allergy.    04/14/2024 CTAP showed new small bilateral pleural effusions and mild bibasilar atelectasis, colonic diverticulosis without evidence of diverticulitis and stable small fat-containing left inguinal hernia.  At that time recommended by Lauraine Pa, PA to follow-up with PCP about pleural effusions.  Discussed shortness of breath or chest pain should go to the ER.  That time schedule follow-up.    04/25/2024 office visit with cardiology.  At that time discussed A-fib and shortness of breath with abdominal pain.  Apparently been started on Lasix  20 mg daily which have provided some relief from pleural effusions.  Patient told to schedule echocardiogram prior to December 2 appoint with Dr. Pietro.  At that time told to see us  about possible Carafate .    Today, the patient presents to clinic and tells me he continues with the left upper quadrant pain.  This is almost constant unless he has a really good bowel movement and at that time he will get some relief for  a while.  Continues to complain of constipation today.  Tells me he only has a bowel movement once every 2 to 3 days and he has to strain in order to have a hard stool.  This is regardless of increasing his Linzess  to 290 mcg.  Tells me his medication used to work for him and would actually send him rushing to the bathroom, not anymore.  Denies any blood in his stool or weight loss.    Denies fever, chills, nausea or vomiting.  Past  Medical History:  Diagnosis Date   Anxiety    Atrial fibrillation (HCC)    CAD (coronary artery disease)    s/p CABG   Carotid stenosis    mild by doppler   Complication of anesthesia    zenkers  diverticulum   Deep vein thrombophlebitis of leg (HCC)    DJD (degenerative joint disease)    Elevated TSH    secondary to Amio-normal free T4.    Essential hypertension, benign    GERD (gastroesophageal reflux disease)    Hemorrhoids    Hiatal hernia    Hyperlipidemia    PAF (paroxysmal atrial fibrillation) (HCC)    Amiodarone    PE (pulmonary embolism)    pre 2007. Chronic anticoagulation   Prostate cancer (HCC)    Sebaceous cyst    neck   Seborrheic dermatitis    Tubular adenoma of colon 05/2011    Past Surgical History:  Procedure Laterality Date   bilateral shoulder surgery     CORONARY ARTERY BYPASS GRAFT     x2    2009   ESOPHAGOGASTRODUODENOSCOPY (EGD) WITH PROPOFOL  N/A 01/24/2023   Procedure: ESOPHAGOGASTRODUODENOSCOPY (EGD) WITH PROPOFOL ;  Surgeon: Shila Gustav GAILS, MD;  Location: [facility] ENDOSCOPY;  Service: Gastroenterology;  Laterality: N/A;   ESOPHAGOGASTRODUODENOSCOPY (EGD) WITH PROPOFOL  N/A 01/26/2023   Procedure: ESOPHAGOGASTRODUODENOSCOPY (EGD) WITH PROPOFOL ;  Surgeon: Stacia Glendia BRAVO, MD;  Location: [facility] ENDOSCOPY;  Service: Gastroenterology;  Laterality: N/A;   I & D EXTREMITY  07/09/2011   Procedure: IRRIGATION AND DEBRIDEMENT EXTREMITY;  Surgeon: Franky JONELLE Curia;  Location: [facility];  Service: Orthopedics;  Laterality: Left;   left knee arthroscopy     MITRAL VALVE REPAIR     POLYPECTOMY  01/26/2023   Procedure: POLYPECTOMY;  Surgeon: Stacia Glendia BRAVO, MD;  Location: [facility] ENDOSCOPY;  Service: Gastroenterology;;   prostate cancer surgery  11/1996   radial retropubic prostatectomy   SCLEROTHERAPY  01/26/2023   Procedure: SCLEROTHERAPY;  Surgeon: Stacia Glendia BRAVO, MD;  Location: [facility] ENDOSCOPY;  Service: Gastroenterology;;  Epi prior to polyp  removal   TANDEM AND OVOID INSERTION     VASECTOMY     ZENKER'S DIVERTICULECTOMY N/A 11/07/2012   Procedure: ZENKER'S DIVERTICULECTOMY ENDOSCOPIC;  Surgeon: Vaughan Ricker, MD;  Location: [facility] OR;  Service: ENT;  Laterality: N/A;    Current Outpatient Medications  Medication Sig Dispense Refill   amiodarone  (PACERONE ) 200 MG tablet TAKE 1 TABLET BY MOUTH DAILY 90 tablet 3   amLODipine  (NORVASC ) 10 MG tablet Take 1 tablet (10 mg total) by mouth daily. 90 tablet 3   Bisoprolol Fumarate 2.5 MG TABS Take 2.5 mg by mouth daily.     Cholecalciferol  (VITAMIN D -3 PO) Take 1 tablet by mouth daily.     ezetimibe  (ZETIA ) 10 MG tablet TAKE 1 TABLET BY MOUTH DAILY 90 tablet 1   famotidine  (PEPCID ) 40 MG tablet Take 1 tablet (40 mg total) by mouth 2 (two) times daily. 60 tablet 5   furosemide  (  LASIX ) 20 MG tablet Take 20 mg by mouth daily as needed for fluid.     indapamide  (LOZOL ) 1.25 MG tablet Take 0.625 mg by mouth daily.     levothyroxine  (SYNTHROID ) 25 MCG tablet TAKE 1 TABLET BY MOUTH DAILY  BEFORE BREAKFAST 90 tablet 3   linaclotide  (LINZESS ) 145 MCG CAPS capsule Take 1 capsule (145 mcg total) by mouth daily. 30 capsule 11   losartan  (COZAAR ) 100 MG tablet TAKE 1 TABLET BY MOUTH DAILY 90 tablet 3   pantoprazole  (PROTONIX ) 40 MG tablet Take 40 mg by mouth 2 (two) times daily.     Vitamin D , Ergocalciferol , (DRISDOL ) 50000 units CAPS capsule Take 1 capsule (50,000 Units total) by mouth every 7 (seven) days. 12 capsule 4   warfarin (COUMADIN ) 1 MG tablet TAKE 1 AND 1/2 TO 2 TABLETS BY  MOUTH DAILY OR AS PRESCRIBED BY  COUMADIN  CLINIC 180 tablet 3   No current facility-administered medications for this visit.    Allergies as of 05/09/2024 - Review Complete 04/25/2024  Allergen Reaction Noted   Iodine Hives 12/11/2008   Statins Other (See Comments)     Family History  Problem Relation Age of Onset   Stroke Mother    Hypertension Mother    Heart attack Father    Esophageal cancer Brother     Hypertension Brother    Heart disease Brother    Prostate cancer Brother    Colon cancer Neg Hx    Pancreatic cancer Neg Hx    Stomach cancer Neg Hx     Social History   Socioeconomic History   Marital status: Widowed    Spouse name: margaret   Number of children: 3   Years of education: Not on file   Highest education level: Not on file  Occupational History   Occupation: retired    Associate Professor: RETIRED  Tobacco Use   Smoking status: Former    Current packs/day: 0.00    Average packs/day: 1 pack/day for 12.0 years (12.0 ttl pk-yrs)    Types: Cigarettes    Start date: 07/06/1956    Quit date: 07/06/1968    Years since quitting: 55.8   Smokeless tobacco: Never  Vaping Use   Vaping status: Never Used  Substance and Sexual Activity   Alcohol use: Not Currently   Drug use: No   Sexual activity: Not Currently    Partners: Female    Comment: WIDOWED  Other Topics Concern   Not on file  Social History Narrative   Not on file   Social Drivers of Health   Financial Resource Strain: Not on file  Food Insecurity: No Food Insecurity (01/23/2023)   Hunger Vital Sign    Worried About Running Out of Food in the Last Year: Never true    Ran Out of Food in the Last Year: Never true  Transportation Needs: No Transportation Needs (02/12/2023)   PRAPARE - Administrator, Civil Service (Medical): No    Lack of Transportation (Non-Medical): No  Physical Activity: Not on file  Stress: Not on file  Social Connections: Not on file  Intimate Partner Violence: Not At Risk (01/23/2023)   Humiliation, Afraid, Rape, and Kick questionnaire    Fear of Current or Ex-Partner: No    Emotionally Abused: No    Physically Abused: No    Sexually Abused: No    Review of Systems:    Constitutional: No weight loss, fever or chills Cardiovascular: No chest pain Respiratory: No SOB  Gastrointestinal: See HPI and otherwise negative   Physical Exam:  Vital signs: BP (!) 134/56   Pulse 68    Ht 5' 9 (1.753 m)   Wt 169 lb (76.7 kg)   BMI 24.96 kg/m    Constitutional:   Pleasant elderly Caucasian male appears to be in NAD, Well developed, Well nourished, alert and cooperative Respiratory: Respirations even and unlabored. Lungs clear to auscultation bilaterally.   No wheezes, crackles, or rhonchi.  Cardiovascular: Normal S1, S2. No MRG. Regular rate and rhythm. No peripheral edema, cyanosis or pallor. Mild LUQ ttp, No rebound or guarding. Normal bowel sounds. No appreciable masses or hepatomegaly. Rectal:  Not performed.  Psychiatric: Oriented to person, place and time. Demonstrates good judgement and reason without abnormal affect or behaviors.  RELEVANT LABS AND IMAGING: CBC    Component Value Date/Time   WBC 6.0 08/27/2023 0826   WBC 7.3 01/28/2023 0648   RBC 5.13 08/27/2023 0826   RBC 2.88 (L) 01/28/2023 0648   HGB 14.4 08/27/2023 0826   HCT 44.2 08/27/2023 0826   PLT 275 08/27/2023 0826   MCV 86 08/27/2023 0826   MCH 28.1 08/27/2023 0826   MCH 29.5 01/28/2023 0648   MCHC 32.6 08/27/2023 0826   MCHC 33.6 01/28/2023 0648   RDW 15.6 (H) 08/27/2023 0826   LYMPHSABS 0.9 01/28/2023 0648   LYMPHSABS 1.1 05/16/2018 0827   MONOABS 0.7 01/28/2023 0648   EOSABS 0.3 01/28/2023 0648   EOSABS 0.1 05/16/2018 0827   BASOSABS 0.0 01/28/2023 0648   BASOSABS 0.0 05/16/2018 0827    CMP     Component Value Date/Time   NA 141 04/05/2024 1008   NA 141 08/27/2023 0826   K 5.0 04/05/2024 1008   CL 110 04/05/2024 1008   CO2 23 04/05/2024 1008   GLUCOSE 109 (H) 04/05/2024 1008   BUN 27 (H) 04/05/2024 1008   BUN 30 (H) 08/27/2023 0826   CREATININE 1.80 (H) 04/05/2024 1008   CALCIUM 8.8 04/05/2024 1008   PROT 7.2 08/27/2023 0826   ALBUMIN 4.6 08/27/2023 0826   AST 33 08/27/2023 0826   ALT 28 08/27/2023 0826   ALKPHOS 142 (H) 08/27/2023 0826   BILITOT 0.5 08/27/2023 0826   GFRNONAA 48 (L) 01/28/2023 0648   GFRAA 52 (L) 01/09/2020 1025    Assessment: 1.  Constipation:  For the past 8 months, Linzess  increased to 290 mcg daily at last visit with no change in symptoms, last colonoscopy in 2018 with multiple polyps, no repeat due to age; thought to be slow transit 2.  Left upper quadrant abdominal pain: With above, better after good bowel movement, at last visit x-ray ordered which did not show constipation, followed by CT the abdomen pelvis which did not show etiology for symptoms, did show new pleural effusions which were followed by cardiology and patient on Lasix , shortness of breath is improved; most likely related to constipation as it seems relieved after a bowel movement 3.  Epigastric pain:  last EGD July 2024 for GI bleed with inflammatory polyp and otherwise normal, no complaint of epigastric pain today on Pantoprazole  40 twice daily Pepcid  20 twice daily 4.  History of GERD  Plan: 1.  At this point discussed with the patient that it would make sense that if his pain is relieved with a good bowel movement that it is related to his chronic constipation.  We will continue to work on improving this.  Today discontinued Linzess  and started him on Amitiza 24 mcg twice  daily.  He will try this for the next 2 weeks and let me know how it is going.  If no improvement will switch products to Trulance or other. 2.  Continue current medications for reflux 3.  Patient to follow in clinic in 2 months or sooner if necessary.  He will keep in touch via MyChart in the meantime so we can try various products to improve his constipation and hopefully his abdominal pain. 4.  Did discuss with the patient that he would be very high risk for colonoscopy which I think would be low yield.  Will try to avoid this if possible.  Delon Failing, PA-[facility] 05/09/2024, 9:19 AM  Cc: Clarice Nottingham, MD

## 2024-05-17 DIAGNOSIS — R600 Localized edema: Secondary | ICD-10-CM | POA: Diagnosis not present

## 2024-05-17 DIAGNOSIS — J90 Pleural effusion, not elsewhere classified: Secondary | ICD-10-CM | POA: Diagnosis not present

## 2024-05-17 DIAGNOSIS — Z23 Encounter for immunization: Secondary | ICD-10-CM | POA: Diagnosis not present

## 2024-05-17 DIAGNOSIS — R1084 Generalized abdominal pain: Secondary | ICD-10-CM | POA: Diagnosis not present

## 2024-05-23 DIAGNOSIS — C4442 Squamous cell carcinoma of skin of scalp and neck: Secondary | ICD-10-CM | POA: Diagnosis not present

## 2024-05-24 ENCOUNTER — Other Ambulatory Visit: Payer: Self-pay | Admitting: Cardiology

## 2024-05-24 DIAGNOSIS — R7989 Other specified abnormal findings of blood chemistry: Secondary | ICD-10-CM

## 2024-05-24 NOTE — Progress Notes (Signed)
 HPI: FU coronary artery disease status post coronary bypassing graft, as well as mitral valve repair in January 2009. Pt also with h/o atrial fibrillation and on amiodarone . Nuclear study December 2022 showed ejection fraction 63% and no ischemia or infarction.  ABIs December 2022 normal on the left and noncompressible on the right. Carotid dopplers 8/23 showed 1-39 bilateral stenosis.  Echocardiogram September 2024 showed normal LV function, mild left ventricular hypertrophy, severe left atrial enlargement, status post mitral valve repair with mild to moderate mitral stenosis (mean gradient 7 mmHg), trace mitral regurgitation, mild aortic insufficiency.  Had admission for weakness/syncope July 2024 and found to have GI bleeding.  Had gastric polyp resected.  Abdominal CT October 2025 showed small bilateral pleural effusions and patient was seen for dyspnea.  Lasix  was prescribed and he improved.  Echo ordered and showed normal LV function, mild left atrial enlargement, status post mitral valve repair with moderate mitral stenosis (mean gradient 8 mmHg), mild mitral digitation, mild to moderate tricuspid regurgitation, trace aortic insufficiency. Since last seen, patient denies dyspnea on exertion, orthopnea, exertional chest pain or syncope.  Occasional pedal edema if he does not take Lasix .  He is having difficulties with abdominal pain and is being seen by gastroenterology.  Current Outpatient Medications  Medication Sig Dispense Refill   amiodarone  (PACERONE ) 200 MG tablet TAKE 1 TABLET BY MOUTH DAILY 90 tablet 3   amLODipine  (NORVASC ) 10 MG tablet Take 1 tablet (10 mg total) by mouth daily. 90 tablet 3   Bisoprolol Fumarate 2.5 MG TABS Take 2.5 mg by mouth daily.     Cholecalciferol  (VITAMIN D -3 PO) Take 1 tablet by mouth daily.     ezetimibe  (ZETIA ) 10 MG tablet TAKE 1 TABLET BY MOUTH DAILY 90 tablet 1   famotidine  (PEPCID ) 40 MG tablet Take 1 tablet (40 mg total) by mouth 2 (two) times daily.  60 tablet 5   furosemide  (LASIX ) 20 MG tablet Take 20 mg by mouth daily as needed for fluid.     indapamide  (LOZOL ) 1.25 MG tablet Take 0.625 mg by mouth daily.     levothyroxine  (SYNTHROID ) 25 MCG tablet TAKE 1 TABLET BY MOUTH DAILY  BEFORE BREAKFAST 90 tablet 3   losartan  (COZAAR ) 100 MG tablet TAKE 1 TABLET BY MOUTH DAILY 90 tablet 3   lubiprostone  (AMITIZA ) 24 MCG capsule Take 1 capsule (24 mcg total) by mouth 2 (two) times daily with a meal. 60 capsule 3   pantoprazole  (PROTONIX ) 40 MG tablet Take 40 mg by mouth 2 (two) times daily.     Vitamin D , Ergocalciferol , (DRISDOL ) 50000 units CAPS capsule Take 1 capsule (50,000 Units total) by mouth every 7 (seven) days. 12 capsule 4   warfarin (COUMADIN ) 1 MG tablet TAKE 1 AND 1/2 TO 2 TABLETS BY  MOUTH DAILY OR AS PRESCRIBED BY  COUMADIN  CLINIC 180 tablet 3   No current facility-administered medications for this visit.     Past Medical History:  Diagnosis Date   Anxiety    Atrial fibrillation (HCC)    CAD (coronary artery disease)    s/p CABG   Carotid stenosis    mild by doppler   Complication of anesthesia    zenkers  diverticulum   Deep vein thrombophlebitis of leg (HCC)    DJD (degenerative joint disease)    Elevated TSH    secondary to Amio-normal free T4.    Essential hypertension, benign    GERD (gastroesophageal reflux disease)    Hemorrhoids  Hiatal hernia    Hyperlipidemia    PAF (paroxysmal atrial fibrillation) (HCC)    Amiodarone    PE (pulmonary embolism)    pre 2007. Chronic anticoagulation   Prostate cancer (HCC)    Sebaceous cyst    neck   Seborrheic dermatitis    Tubular adenoma of colon 05/2011    Past Surgical History:  Procedure Laterality Date   bilateral shoulder surgery     CORONARY ARTERY BYPASS GRAFT     x2    2009   ESOPHAGOGASTRODUODENOSCOPY (EGD) WITH PROPOFOL  N/A 01/24/2023   Procedure: ESOPHAGOGASTRODUODENOSCOPY (EGD) WITH PROPOFOL ;  Surgeon: Shila Gustav GAILS, MD;  Location: [facility]  ENDOSCOPY;  Service: Gastroenterology;  Laterality: N/A;   ESOPHAGOGASTRODUODENOSCOPY (EGD) WITH PROPOFOL  N/A 01/26/2023   Procedure: ESOPHAGOGASTRODUODENOSCOPY (EGD) WITH PROPOFOL ;  Surgeon: Stacia Glendia BRAVO, MD;  Location: [facility] ENDOSCOPY;  Service: Gastroenterology;  Laterality: N/A;   I & D EXTREMITY  07/09/2011   Procedure: IRRIGATION AND DEBRIDEMENT EXTREMITY;  Surgeon: Franky JONELLE Curia;  Location: [facility];  Service: Orthopedics;  Laterality: Left;   left knee arthroscopy     MITRAL VALVE REPAIR     POLYPECTOMY  01/26/2023   Procedure: POLYPECTOMY;  Surgeon: Stacia Glendia BRAVO, MD;  Location: [facility] ENDOSCOPY;  Service: Gastroenterology;;   prostate cancer surgery  11/1996   radial retropubic prostatectomy   SCLEROTHERAPY  01/26/2023   Procedure: SCLEROTHERAPY;  Surgeon: Stacia Glendia BRAVO, MD;  Location: [facility] ENDOSCOPY;  Service: Gastroenterology;;  Epi prior to polyp removal   TANDEM AND OVOID INSERTION     VASECTOMY     ZENKER'S DIVERTICULECTOMY N/A 11/07/2012   Procedure: ZENKER'S DIVERTICULECTOMY ENDOSCOPIC;  Surgeon: Vaughan Ricker, MD;  Location: [facility] OR;  Service: ENT;  Laterality: N/A;    Social History   Socioeconomic History   Marital status: Widowed    Spouse name: margaret   Number of children: 3   Years of education: Not on file   Highest education level: Not on file  Occupational History   Occupation: retired    Associate Professor: RETIRED  Tobacco Use   Smoking status: Former    Current packs/day: 0.00    Average packs/day: 1 pack/day for 12.0 years (12.0 ttl pk-yrs)    Types: Cigarettes    Start date: 07/06/1956    Quit date: 07/06/1968    Years since quitting: 55.9   Smokeless tobacco: Never  Vaping Use   Vaping status: Never Used  Substance and Sexual Activity   Alcohol use: Not Currently   Drug use: No   Sexual activity: Not Currently    Partners: Female    Comment: WIDOWED  Other Topics Concern   Not on file  Social History Narrative   Not on file    Social Drivers of Health   Financial Resource Strain: Not on file  Food Insecurity: No Food Insecurity (01/23/2023)   Hunger Vital Sign    Worried About Running Out of Food in the Last Year: Never true    Ran Out of Food in the Last Year: Never true  Transportation Needs: No Transportation Needs (02/12/2023)   PRAPARE - Administrator, Civil Service (Medical): No    Lack of Transportation (Non-Medical): No  Physical Activity: Not on file  Stress: Not on file  Social Connections: Not on file  Intimate Partner Violence: Not At Risk (01/23/2023)   Humiliation, Afraid, Rape, and Kick questionnaire    Fear of Current or Ex-Partner: No    Emotionally Abused: No  Physically Abused: No    Sexually Abused: No    Family History  Problem Relation Age of Onset   Stroke Mother    Hypertension Mother    Heart attack Father    Esophageal cancer Brother    Hypertension Brother    Heart disease Brother    Prostate cancer Brother    Colon cancer Neg Hx    Pancreatic cancer Neg Hx    Stomach cancer Neg Hx     ROS: no fevers or chills, productive cough, hemoptysis, dysphasia, odynophagia, melena, hematochezia, dysuria, hematuria, rash, seizure activity, orthopnea, PND, claudication. Remaining systems are negative.  Physical Exam: Well-developed well-nourished in no acute distress.  Skin is warm and dry.  HEENT is normal.  Neck is supple.  Chest is clear to auscultation with normal expansion.  Cardiovascular exam is regular rate and rhythm.  Abdominal exam nontender or distended. No masses palpated. Extremities show no edema. neuro grossly intact   A/P  1 paroxysmal atrial fibrillation-patient remains in sinus rhythm on exam.  Continue amiodarone  and Coumadin  at present dose.  Declined DOAC's due to expense.  Recent liver function is normal.  Check TSH, free T4.  2 previous mitral valve repair-continue SBE prophylaxis.  Moderate mitral stenosis on most recent  echocardiogram with mild mitral regurgitation.  Given age would like to be conservative.  Will continue Lasix  at present dose.  3 hyperlipidemia-continue Zetia .  Patient is intolerant to statins.  4 coronary artery disease-he denies chest pain.  Continue present medical regimen.  5 hypertension-patient's blood pressure is controlled.  Continue present medications.  6 carotid artery disease-minimal on most recent Dopplers.  7 chronic diastolic congestive heart failure-patient has improved with low-dose Lasix .  He will continue with that regimen.  Check BNP.  Redell Shallow, MD

## 2024-05-26 ENCOUNTER — Ambulatory Visit (HOSPITAL_COMMUNITY)
Admission: RE | Admit: 2024-05-26 | Discharge: 2024-05-26 | Disposition: A | Discharge status: Home / Self Care | Source: Ambulatory Visit | Attending: Physician Assistant | Admitting: Physician Assistant

## 2024-05-26 DIAGNOSIS — I480 Paroxysmal atrial fibrillation: Secondary | ICD-10-CM | POA: Diagnosis not present

## 2024-05-26 DIAGNOSIS — I4891 Unspecified atrial fibrillation: Secondary | ICD-10-CM | POA: Diagnosis not present

## 2024-05-26 DIAGNOSIS — E785 Hyperlipidemia, unspecified: Secondary | ICD-10-CM | POA: Diagnosis not present

## 2024-05-26 DIAGNOSIS — I059 Rheumatic mitral valve disease, unspecified: Secondary | ICD-10-CM | POA: Insufficient documentation

## 2024-05-26 DIAGNOSIS — I10 Essential (primary) hypertension: Secondary | ICD-10-CM | POA: Insufficient documentation

## 2024-05-26 DIAGNOSIS — Z9889 Other specified postprocedural states: Secondary | ICD-10-CM | POA: Insufficient documentation

## 2024-05-26 DIAGNOSIS — Z5181 Encounter for therapeutic drug level monitoring: Secondary | ICD-10-CM | POA: Diagnosis not present

## 2024-05-26 DIAGNOSIS — Z951 Presence of aortocoronary bypass graft: Secondary | ICD-10-CM | POA: Insufficient documentation

## 2024-05-26 LAB — ECHOCARDIOGRAM COMPLETE
AR max vel: 1.56 cm2
AV Area VTI: 1.57 cm2
AV Area mean vel: 1.55 cm2
AV Mean grad: 7 mmHg
AV Peak grad: 12.5 mmHg
Ao pk vel: 1.77 m/s
Area-P 1/2: 2.46 cm2
MV VTI: 1.18 cm2
S' Lateral: 2.66 cm

## 2024-05-26 NOTE — Telephone Encounter (Signed)
 Pt's pharmacy is requesting a refill on medication levothyroxine . Would Dr. Pietro like to refill this medication? Please address

## 2024-05-29 NOTE — Telephone Encounter (Signed)
 May refill  medication .  Records show that Dr Pietro has been filling medication since 04/02/2017  This is when patient stared medication  It is okay to refill

## 2024-05-30 ENCOUNTER — Ambulatory Visit: Payer: Self-pay | Admitting: Physician Assistant

## 2024-06-06 ENCOUNTER — Encounter: Payer: Self-pay | Admitting: Cardiology

## 2024-06-06 ENCOUNTER — Ambulatory Visit: Attending: Cardiology | Admitting: Cardiology

## 2024-06-06 VITALS — BP 130/52 | HR 56 | Ht 69.0 in | Wt 171.0 lb

## 2024-06-06 DIAGNOSIS — I480 Paroxysmal atrial fibrillation: Secondary | ICD-10-CM | POA: Insufficient documentation

## 2024-06-06 DIAGNOSIS — Z951 Presence of aortocoronary bypass graft: Secondary | ICD-10-CM | POA: Diagnosis not present

## 2024-06-06 DIAGNOSIS — I349 Nonrheumatic mitral valve disorder, unspecified: Secondary | ICD-10-CM

## 2024-06-06 DIAGNOSIS — E039 Hypothyroidism, unspecified: Secondary | ICD-10-CM | POA: Diagnosis not present

## 2024-06-06 DIAGNOSIS — I10 Essential (primary) hypertension: Secondary | ICD-10-CM | POA: Insufficient documentation

## 2024-06-06 DIAGNOSIS — I059 Rheumatic mitral valve disease, unspecified: Secondary | ICD-10-CM | POA: Diagnosis not present

## 2024-06-06 DIAGNOSIS — I5032 Chronic diastolic (congestive) heart failure: Secondary | ICD-10-CM | POA: Diagnosis not present

## 2024-06-06 DIAGNOSIS — E785 Hyperlipidemia, unspecified: Secondary | ICD-10-CM | POA: Diagnosis not present

## 2024-06-06 NOTE — Patient Instructions (Addendum)
 Medication Instructions:  NO CHANGES  *If you need a refill on your cardiac medications before your next appointment, please call your pharmacy*  Lab Work: Please complete the following labs with your PCP: TSH, Free T4, BNP  If you have labs (blood work) drawn today and your tests are completely normal, you will receive your results only by: MyChart Message (if you have MyChart) OR A paper copy in the mail If you have any lab test that is abnormal or we need to change your treatment, we will call you to review the results.  Testing/Procedures: NONE  Follow-Up: At [facility], you and your health needs are our priority.  As part of our continuing mission to provide you with exceptional heart care, our providers are all part of one team.  This team includes your primary Cardiologist (physician) and Advanced Practice Providers or APPs (Physician Assistants and Nurse Practitioners) who all work together to provide you with the care you need, when you need it.  Your next appointment:   6 months with Dr. Pietro  We recommend signing up for the patient portal called MyChart.  Sign up information is provided on this After Visit Summary.  MyChart is used to connect with patients for Virtual Visits (Telemedicine).  Patients are able to view lab/test results, encounter notes, upcoming appointments, etc.  Non-urgent messages can be sent to your provider as well.   To learn more about what you can do with MyChart, go to [email or web address].   Other Instructions

## 2024-06-07 DIAGNOSIS — I480 Paroxysmal atrial fibrillation: Secondary | ICD-10-CM | POA: Diagnosis not present

## 2024-06-07 DIAGNOSIS — Z951 Presence of aortocoronary bypass graft: Secondary | ICD-10-CM | POA: Diagnosis not present

## 2024-06-07 DIAGNOSIS — I5032 Chronic diastolic (congestive) heart failure: Secondary | ICD-10-CM | POA: Diagnosis not present

## 2024-06-07 DIAGNOSIS — E039 Hypothyroidism, unspecified: Secondary | ICD-10-CM | POA: Diagnosis not present

## 2024-06-07 DIAGNOSIS — I059 Rheumatic mitral valve disease, unspecified: Secondary | ICD-10-CM | POA: Diagnosis not present

## 2024-06-09 ENCOUNTER — Ambulatory Visit: Payer: Self-pay | Admitting: Cardiology

## 2024-06-09 LAB — TSH: TSH: 7.74 u[IU]/mL — ABNORMAL HIGH (ref 0.450–4.500)

## 2024-06-09 LAB — BRAIN NATRIURETIC PEPTIDE: BNP: 249.5 pg/mL — AB (ref 0.0–100.0)

## 2024-06-09 LAB — T4, FREE: Free T4: 1.75 ng/dL (ref 0.82–1.77)

## 2024-06-14 NOTE — Telephone Encounter (Signed)
 Letter of results sent to pt

## 2024-06-15 ENCOUNTER — Ambulatory Visit: Attending: Cardiology | Admitting: *Deleted

## 2024-06-15 DIAGNOSIS — I480 Paroxysmal atrial fibrillation: Secondary | ICD-10-CM | POA: Diagnosis present

## 2024-06-15 DIAGNOSIS — Z5181 Encounter for therapeutic drug level monitoring: Secondary | ICD-10-CM | POA: Diagnosis not present

## 2024-06-15 LAB — POCT INR: POC INR: 3

## 2024-06-15 NOTE — Patient Instructions (Signed)
 Description   INR 3.0; Continue taking warfarin 1.5 tablets daily except on Sundays and Thursdays take 2 tablets.  Recheck INR in 7 weeks.  Coumadin  Clinic [phone]

## 2024-06-15 NOTE — Progress Notes (Signed)
 Lab Results  Component Value Date   INR 3.0 06/15/2024   INR 2.8 04/27/2024   INR 3.0 03/09/2024   PROTIME 18.2 12/11/2008    Description   INR 3.0; Continue taking warfarin 1.5 tablets daily except on Sundays and Thursdays take 2 tablets.  Recheck INR in 7 weeks.  Coumadin  Clinic [phone]

## 2024-07-10 DIAGNOSIS — I10 Essential (primary) hypertension: Secondary | ICD-10-CM

## 2024-07-14 ENCOUNTER — Telehealth: Payer: Self-pay

## 2024-07-14 NOTE — Telephone Encounter (Signed)
 Auth Submission: NO AUTH NEEDED Site of care: Site of care: [facility] Payer: Medicare A/B with [employer] [place] supplement Medication & CPT/J Code(s) submitted: Leqvio  (Inclisiran) J1306 Diagnosis Code:  Route of submission (phone, fax, portal):  Phone # Fax # Auth type: Buy/Bill PB Units/visits requested: 284mg  x 2 doses Reference number:  Approval from: 07/14/24 to 08/05/25

## 2024-08-01 ENCOUNTER — Other Ambulatory Visit: Payer: Self-pay | Admitting: Cardiology

## 2024-08-03 ENCOUNTER — Ambulatory Visit

## 2024-08-03 DIAGNOSIS — I480 Paroxysmal atrial fibrillation: Secondary | ICD-10-CM | POA: Diagnosis present

## 2024-08-03 DIAGNOSIS — Z5181 Encounter for therapeutic drug level monitoring: Secondary | ICD-10-CM | POA: Diagnosis present

## 2024-08-03 LAB — POCT INR: INR: 4.4 — AB (ref 2.0–3.0)

## 2024-08-03 NOTE — Patient Instructions (Signed)
 Hold tonight only and eat greens then Continue taking warfarin 1.5 tablets daily except on Sundays and Thursdays take 2 tablets.  Recheck INR in 5 weeks.  Coumadin  Clinic [phone]

## 2024-08-03 NOTE — Progress Notes (Signed)
 INR 4.4  Hold tonight only and eat greens then Continue taking warfarin 1.5 tablets daily except on Sundays and Thursdays take 2 tablets.  Recheck INR in 5 weeks.  Coumadin  Clinic [phone]

## 2024-08-06 ENCOUNTER — Other Ambulatory Visit: Payer: Self-pay | Admitting: Physician Assistant

## 2024-09-07 ENCOUNTER — Ambulatory Visit

## 2024-09-11 ENCOUNTER — Ambulatory Visit
